# Patient Record
Sex: Female | Born: 1935
Health system: Southern US, Community
[De-identification: ages and names within clinical notes are randomized; demographics above are authoritative.]

## PROBLEM LIST (undated history)

## (undated) DIAGNOSIS — F32A Depression, unspecified: Secondary | ICD-10-CM

## (undated) DIAGNOSIS — J449 Chronic obstructive pulmonary disease, unspecified: Secondary | ICD-10-CM

## (undated) DIAGNOSIS — S72009A Fracture of unspecified part of neck of unspecified femur, initial encounter for closed fracture: Secondary | ICD-10-CM

## (undated) DIAGNOSIS — R413 Other amnesia: Secondary | ICD-10-CM

## (undated) DIAGNOSIS — M810 Age-related osteoporosis without current pathological fracture: Secondary | ICD-10-CM

## (undated) DIAGNOSIS — I499 Cardiac arrhythmia, unspecified: Secondary | ICD-10-CM

## (undated) DIAGNOSIS — J189 Pneumonia, unspecified organism: Secondary | ICD-10-CM

## (undated) DIAGNOSIS — M1990 Unspecified osteoarthritis, unspecified site: Secondary | ICD-10-CM

## (undated) DIAGNOSIS — I10 Essential (primary) hypertension: Secondary | ICD-10-CM

## (undated) DIAGNOSIS — I639 Cerebral infarction, unspecified: Secondary | ICD-10-CM

## (undated) DIAGNOSIS — M25559 Pain in unspecified hip: Secondary | ICD-10-CM

## (undated) DIAGNOSIS — R0602 Shortness of breath: Secondary | ICD-10-CM

## (undated) DIAGNOSIS — F419 Anxiety disorder, unspecified: Secondary | ICD-10-CM

## (undated) DIAGNOSIS — I5032 Chronic diastolic (congestive) heart failure: Secondary | ICD-10-CM

## (undated) DIAGNOSIS — L309 Dermatitis, unspecified: Secondary | ICD-10-CM

## (undated) DIAGNOSIS — R233 Spontaneous ecchymoses: Secondary | ICD-10-CM

## (undated) DIAGNOSIS — D649 Anemia, unspecified: Secondary | ICD-10-CM

## (undated) DIAGNOSIS — F0390 Unspecified dementia without behavioral disturbance: Secondary | ICD-10-CM

## (undated) DIAGNOSIS — R296 Repeated falls: Secondary | ICD-10-CM

## (undated) DIAGNOSIS — F329 Major depressive disorder, single episode, unspecified: Secondary | ICD-10-CM

## (undated) DIAGNOSIS — R238 Other skin changes: Secondary | ICD-10-CM

## (undated) DIAGNOSIS — W19XXXA Unspecified fall, initial encounter: Secondary | ICD-10-CM

## (undated) HISTORY — DX: Other amnesia: R41.3

## (undated) HISTORY — PX: ABDOMINAL HYSTERECTOMY: SHX81

## (undated) HISTORY — DX: Chronic diastolic (congestive) heart failure: I50.32

## (undated) HISTORY — DX: Anxiety disorder, unspecified: F41.9

## (undated) HISTORY — PX: LEG SURGERY: SHX1003

## (undated) HISTORY — PX: JOINT REPLACEMENT: SHX530

---

## 1997-10-03 ENCOUNTER — Inpatient Hospital Stay (HOSPITAL_COMMUNITY): Admission: EM | Admit: 1997-10-03 | Discharge: 1997-10-07 | Payer: Self-pay | Admitting: Emergency Medicine

## 1997-10-21 ENCOUNTER — Ambulatory Visit (HOSPITAL_COMMUNITY): Admission: RE | Admit: 1997-10-21 | Discharge: 1997-10-21 | Payer: Self-pay | Admitting: General Surgery

## 1997-11-04 ENCOUNTER — Ambulatory Visit (HOSPITAL_COMMUNITY): Admission: RE | Admit: 1997-11-04 | Discharge: 1997-11-04 | Payer: Self-pay | Admitting: General Surgery

## 1998-05-01 ENCOUNTER — Emergency Department (HOSPITAL_COMMUNITY): Admission: EM | Admit: 1998-05-01 | Discharge: 1998-05-01 | Payer: Self-pay | Admitting: Emergency Medicine

## 1999-12-27 ENCOUNTER — Encounter: Payer: Self-pay | Admitting: *Deleted

## 1999-12-27 ENCOUNTER — Encounter: Admission: RE | Admit: 1999-12-27 | Discharge: 1999-12-27 | Payer: Self-pay | Admitting: *Deleted

## 2005-08-30 ENCOUNTER — Ambulatory Visit (HOSPITAL_COMMUNITY): Payer: Self-pay | Admitting: *Deleted

## 2006-02-26 HISTORY — PX: OTHER SURGICAL HISTORY: SHX169

## 2006-09-08 ENCOUNTER — Inpatient Hospital Stay (HOSPITAL_COMMUNITY): Admission: EM | Admit: 2006-09-08 | Discharge: 2006-09-16 | Payer: Self-pay | Admitting: Emergency Medicine

## 2006-09-12 ENCOUNTER — Ambulatory Visit: Payer: Self-pay | Admitting: Physical Medicine & Rehabilitation

## 2006-12-03 ENCOUNTER — Ambulatory Visit (HOSPITAL_COMMUNITY): Admission: RE | Admit: 2006-12-03 | Discharge: 2006-12-03 | Payer: Self-pay | Admitting: Internal Medicine

## 2007-12-17 ENCOUNTER — Ambulatory Visit (HOSPITAL_COMMUNITY): Admission: RE | Admit: 2007-12-17 | Discharge: 2007-12-17 | Payer: Self-pay | Admitting: Internal Medicine

## 2008-05-25 ENCOUNTER — Encounter: Admission: RE | Admit: 2008-05-25 | Discharge: 2008-05-25 | Payer: Self-pay | Admitting: Internal Medicine

## 2008-08-03 ENCOUNTER — Encounter: Admission: RE | Admit: 2008-08-03 | Discharge: 2008-08-03 | Payer: Self-pay | Admitting: Internal Medicine

## 2008-10-06 ENCOUNTER — Emergency Department (HOSPITAL_COMMUNITY): Admission: EM | Admit: 2008-10-06 | Discharge: 2008-10-06 | Payer: Self-pay | Admitting: Emergency Medicine

## 2008-12-24 ENCOUNTER — Ambulatory Visit (HOSPITAL_COMMUNITY): Admission: RE | Admit: 2008-12-24 | Discharge: 2008-12-24 | Payer: Self-pay | Admitting: Internal Medicine

## 2009-08-04 ENCOUNTER — Encounter: Admission: RE | Admit: 2009-08-04 | Discharge: 2009-08-04 | Payer: Self-pay | Admitting: Internal Medicine

## 2010-01-05 ENCOUNTER — Ambulatory Visit (HOSPITAL_COMMUNITY)
Admission: RE | Admit: 2010-01-05 | Discharge: 2010-01-05 | Payer: Self-pay | Source: Home / Self Care | Admitting: Internal Medicine

## 2010-06-30 ENCOUNTER — Other Ambulatory Visit: Payer: Self-pay | Admitting: Internal Medicine

## 2010-06-30 DIAGNOSIS — Z1231 Encounter for screening mammogram for malignant neoplasm of breast: Secondary | ICD-10-CM

## 2010-07-11 NOTE — Op Note (Signed)
Yolanda Baker NO.:  [id number]   MEDICAL RECORD NO.:  [id number]          PATIENT TYPE:  INP   LOCATION:  5029                         FACILITY:  [facility]   PHYSICIAN:  Ollen Gross, M.D.    DATE OF BIRTH:  [birth year]   DATE OF PROCEDURE:  09/09/2006  DATE OF DISCHARGE:                               OPERATIVE REPORT   PREOPERATIVE DIAGNOSES:  Displaced left femoral neck fracture.   POSTOPERATIVE DIAGNOSIS:  Displaced left femoral neck fracture.   PROCEDURE:  Left hip hemiarthroplasty.   SURGEON:  Dr. Lequita Halt.   ASSISTANT:  [name], PA-C.   ANESTHESIA:  General.   ESTIMATED BLOOD LOSS:  200.   DRAINS:  None.   COMPLICATION:  None.   CONDITION:  Stable to recovery room.   CLINICAL NOTE:  Yolanda Baker is an 77 year old female who had fall at home  yesterday sustaining a displaced left femoral neck fracture.  She  presents for left hip hemiarthroplasty.   PROCEDURE IN DETAIL:  After successful administration of general  anesthetic, the patient was  placed in the right lateral decubitus  position with the left side up and held with the hip positioner. The  left lower extremity was isolated from her perineum with plastic drapes  and prepped and draped in the usual sterile fashion. A short  posterolateral incision was made with a 10 blade through the  subcutaneous tissue to the level of the fascia lata which was incised in  line with the skin incision.  The sciatic nerve was palpated and  protected and short external rotators isolated off the femur.  Capsulotomy was performed and the fracture identified.  The femoral head  is removed, diameter measures 47 mm.  I addressed the femoral neck and  with an oscillating saw cut it to the appropriate height and  configuration.  We used a canal finder to access the femoral canal and  thoroughly irrigated the canal.  Broaching starts at size 1 up to a size  3 for the DePuy Summit basic cemented stem.  We then  trialed the 47-mm  bipolar into the acetabulum and had a great suction fit with that.   The cement restrictor was placed, a size 3 at the appropriate depth in  the femoral canal.  The femur was prepared with pulsatile lavage.  The  cement was mixed and once ready for implantation is injected into the  femoral canal and pressurized.  The size 3 Summit basic cemented stem is  placed matching the patient's native anteversion.  Once the cement fully  hardens then the trial 28 plus 1.5 head is placed with the trial 47  bipolar.  The hip is reduced with excellent stability, full extension,  flexion and rotation to 70 degrees flexion and 40 degrees adduction, 70  degrees internal rotation and 90 degrees,  flexion, 70 degrees internal  rotation.  The hip was dislocated and the trials removed.  The permanent  28 mm +1.5 head is placed with the 47 mm bipolar component. The hip is  then reduced with the same stability parameters.  The wound is copiously  irrigated with saline solution and the capsule and short rotators  reattached to the femur through drill holes.  The fascia lata was closed  with interrupted #1 Vicryl, subcu closed with #1 and 2-0 Vicryl and  subcuticular with running 4-0 Monocryl.  The incision is cleaned and  dried and Steri-Strips and a bulky sterile dressing applied.  She is  placed into a knee immobilizer, awakened and transported to recovery in  stable condition.      Ollen Gross, M.D.  Electronically Signed     FA/MEDQ  D:  09/09/2006  T:  09/10/2006  Job:  [id number]

## 2010-07-14 NOTE — Discharge Summary (Signed)
[name], Baker NO.:  [id number]   MEDICAL RECORD NO.:  [id number]          PATIENT TYPE:  INP   LOCATION:  5029                         FACILITY:  [facility]   PHYSICIAN:  Ollen Gross, M.D.    DATE OF BIRTH:  Oct 15, 1923   DATE OF ADMISSION:  09/08/2006  DATE OF DISCHARGE:  09/16/2006                               DISCHARGE SUMMARY   ADMISSION DIAGNOSES:  1. Displaced left femoral neck fracture.  2. Hyponatremia.  3. Osteoporosis.  4. Hypertension.  5. Depression.   DISCHARGE DIAGNOSES:  1. Displaced left femoral neck fracture, status post left hip      hemiarthroplasty.  2. Mild postoperative blood loss anemia, did not require transfusion.  3. Hypokalemia postoperatively, improved.  4. Preoperative hyponatremia, improved postoperatively.  5. Displaced left femoral neck fracture.  6. Hyponatremia.  7. Osteoporosis.  8. Hypertension.  9. Depression.   PROCEDURE:  Date of surgery September 09, 2006, left hip hemiarthroplasty,  surgery Dr. Lequita Halt, assistant Alexzandrew L. Perkins, P.A.C.  Surgery  done under general anesthesia.   CONSULTATIONS:  1. Dr. Felipa Eth covering for Dr. Waynard Edwards.  2. Rehabilitation services, Dr. Thersa Salt.   BRIEF HISTORY:  The patient is an 77 year old female who was admitted to  [facility] on September 08, 2006, for displaced left femoral neck  fracture.  She was admitted and had medical consult called for  preoperative clearance.   LABORATORY DATA:  Preoperative CBC showed hemoglobin 11.8, hematocrit  34.6, white cell count 9.3, postoperative hemoglobin 10.6, then 9.2,  stabilized at H&H 9.2 and 26.6.  Blood gas taken September 08, 2006, pH 7.38,  pCO2 37.7, bicarb 22.6, total CO2 24, base deficit 2.0.  PT INR 15.8 and  1.2 on admission.  Serum _____________ were given per  protocol, last PT  INR 25.6 and 2.2.  BMET on admission sodium 123, chloride 92, glucose  elevated at 125.  Serial BMETs were followed.  Sodium came up to 127  and  then 134.  Last noted normal at 136.  Potassium started normal level at  3.6, dropped down to 3.1, went back to 3.2 then last noted normal at  3.8.  Urine osmolality low at 121, urine sodium less than 10.  Preoperative UA negative.  EKG on the chart dated April 18, 2004,  rate 66, first-degree AV block, normal sterile fashion, no ST or T  changes.  Do not see a chest x-ray report in this chart.  Portable chest  September 08, 2006, suspicious for problems at left atrial appendage raising  the question of mitral valve valvular disease, clinical correlate some  suspicious pulmonary hypertension and COPD.  Other EKG dated September 08, 2006, normal sinus rhythm, sinus arrhythmia, right superior axis  deviation, low voltage QRS.   HOSPITAL COURSE:  The patient admitted to [facility], placed at  bed rest and in Buck's traction for the fracture.  After Dr. Waynard Edwards was  consulted, Dr. Felipa Eth was on call.  The patient was seen and evaluated by  Dr. Felipa Eth.  She was noted to have a low sodium  which was felt to be  multifactorial to medications. Placed on fluid restrictions.  She was  placed on bed rest and waited until her sodium started to improve.  By  the following day her sodium was coming up and the next morning on the  14th, seen by medical services who felt that she would be okay to  surgery.  She was taken to the operating room later that evening and  underwent the procedure without complications.  Tolerated her procedure  well, later was transferred to the recovery room and back to the medical  floor for postoperative care.  She was placed on Coumadin for DVT  prophylaxis, given 24 hours of postoperative IV antibiotics.  Dr. Felipa Eth  and medical team continued to follow along.  She still remained with low  sodium albeit it did improve throughout the hospital course with just  given medications and fluid restrictions.  By day 1 and day 2 she was  doing a little bit better with each day  following surgery.  She started  getting up PT.  Her sodium had come up to the low 130s.  We discontinued  her fluids.  By day 2 she had a little bit low potassium but it was felt  to be due to some delusional component.  We put her on potassium  supplements. Her hemoglobin was low postoperatively down to 9.2, did put  her on iron supplements and her hemoglobin stabilized.  Dressing was  changed on day 2.  Incision healing well and remained in good condition  and healing throughout the hospital course.  She was slow to progress  with physical therapy therefore we got a rehabilitation consult.  The  patient was seen in consultation by rehabilitation services and felt  that she was doing pretty well and had good support, that she may not  require inpatient rehabilitation but continue to follow along.  The  patient was starting to progress with her physical therapy.  By day 5  she was up  ambulating 50 feet.  She wanted to go home, therefore  discharge planning started to make arrangements but she was not quite  ready yet.  She continued to receive therapy and medical care and  progressed through the weekend and by September 16, 2006, she was stable from  a medical standpoint, her sodium had improved.  Her electrolyte  imbalance stabilized.  She was starting progressive physical therapy and  arrangements were made for home care.  She was discharged home.   DISCHARGE PLAN:  The patient was discharged home on September 16, 2006.   DISCHARGE DIAGNOSES:  1. Displaced left femoral neck fracture, status post left hip      hemiarthroplasty.  2. Mild postoperative blood loss anemia, did not require transfusion.  3. Hypokalemia postoperatively, improved.  4. Preoperative hyponatremia, improved postoperatively.  5. Displaced left femoral neck fracture.  6. Hyponatremia.  7. Osteoporosis.  8. Hypertension.  9. Depression.   PROCEDURE:  Date of surgery September 09, 2006, left hip hemiarthroplasty,  surgery  Dr. Lequita Halt, assistant Alexzandrew L. Perkins, P.A.C.  Surgery  done under general anesthesia.   DISCHARGE MEDICATIONS:  1. Coumadin.  2. Vicodin.  3. Robaxin.   ACTIVITY:  She is weightbear as tolerated to left lower extremity.  Hip  precautions, daily dressing changes.  May start showering, home health  PT, home health nursing.   FOLLOWUP:  1 week following discharge.   DISPOSITION:  Home.   CONDITION ON DISCHARGE:  Slowly  improving.      Alexzandrew L. Perkins, P.A.C.      Ollen Gross, M.D.  Electronically Signed    ALP/MEDQ  D:  12/12/2006  T:  12/12/2006  Job:  [id number]   cc:   Ollen Gross, M.D.  Redge Gainer. Perini, M.D.

## 2010-08-07 ENCOUNTER — Ambulatory Visit: Payer: Self-pay

## 2010-08-08 ENCOUNTER — Ambulatory Visit
Admission: RE | Admit: 2010-08-08 | Discharge: 2010-08-08 | Disposition: A | Discharge status: Home / Self Care | Payer: Medicare Other | Source: Ambulatory Visit | Attending: Internal Medicine | Admitting: Internal Medicine

## 2010-08-08 DIAGNOSIS — Z1231 Encounter for screening mammogram for malignant neoplasm of breast: Secondary | ICD-10-CM

## 2010-09-19 ENCOUNTER — Other Ambulatory Visit: Payer: Self-pay | Admitting: Internal Medicine

## 2010-09-19 DIAGNOSIS — R413 Other amnesia: Secondary | ICD-10-CM

## 2010-09-28 ENCOUNTER — Ambulatory Visit
Admission: RE | Admit: 2010-09-28 | Discharge: 2010-09-28 | Disposition: A | Discharge status: Home / Self Care | Payer: Medicare Other | Source: Ambulatory Visit | Attending: Internal Medicine | Admitting: Internal Medicine

## 2010-09-28 DIAGNOSIS — R413 Other amnesia: Secondary | ICD-10-CM

## 2010-12-11 LAB — CBC
HCT: 26.6 — ABNORMAL LOW
HCT: 26.6 — ABNORMAL LOW
HCT: 31.3 — ABNORMAL LOW
Hemoglobin: 10.6 — ABNORMAL LOW
Hemoglobin: 9.2 — ABNORMAL LOW
Hemoglobin: 9.2 — ABNORMAL LOW
MCHC: 34
MCHC: 34.5
MCHC: 34.5
MCV: 94
MCV: 94.3
MCV: 95.7
Platelets: 147 — ABNORMAL LOW
Platelets: 175
Platelets: 177
RBC: 2.82 — ABNORMAL LOW
RBC: 2.83 — ABNORMAL LOW
RBC: 3.27 — ABNORMAL LOW
RDW: 13
RDW: 13.2
RDW: 13.2
WBC: 7
WBC: 8
WBC: 8.9

## 2010-12-11 LAB — BASIC METABOLIC PANEL
BUN: 12
BUN: 12
BUN: 6
BUN: 7
BUN: 9
BUN: 9
CO2: 22
CO2: 25
CO2: 26
CO2: 27
CO2: 27
CO2: 28
Calcium: 7.2 — ABNORMAL LOW
Calcium: 7.3 — ABNORMAL LOW
Calcium: 7.7 — ABNORMAL LOW
Calcium: 7.9 — ABNORMAL LOW
Calcium: 8.3 — ABNORMAL LOW
Calcium: 8.5
Chloride: 100
Chloride: 102
Chloride: 103
Chloride: 104
Chloride: 104
Chloride: 99
Creatinine, Ser: 0.63
Creatinine, Ser: 0.68
Creatinine, Ser: 0.7
Creatinine, Ser: 0.71
Creatinine, Ser: 0.74
Creatinine, Ser: 0.75
GFR calc Af Amer: >60
GFR calc Af Amer: >60
GFR calc Af Amer: >60
GFR calc Af Amer: >60
GFR calc Af Amer: >60
GFR calc Af Amer: >60
GFR calc non Af Amer: >60
GFR calc non Af Amer: >60
GFR calc non Af Amer: >60
GFR calc non Af Amer: >60
GFR calc non Af Amer: >60
GFR calc non Af Amer: >60
Glucose, Bld: 100 — ABNORMAL HIGH
Glucose, Bld: 100 — ABNORMAL HIGH
Glucose, Bld: 108 — ABNORMAL HIGH
Glucose, Bld: 117 — ABNORMAL HIGH
Glucose, Bld: 95
Glucose, Bld: 96
Potassium: 3.2 — ABNORMAL LOW
Potassium: 3.5
Potassium: 3.6
Potassium: 3.8
Potassium: 3.8
Potassium: 4.1
Sodium: 131 — ABNORMAL LOW
Sodium: 131 — ABNORMAL LOW
Sodium: 132 — ABNORMAL LOW
Sodium: 134 — ABNORMAL LOW
Sodium: 136
Sodium: 138

## 2010-12-11 LAB — PROTIME-INR
INR: 1.2
INR: 1.7 — ABNORMAL HIGH
INR: 2 — ABNORMAL HIGH
INR: 2 — ABNORMAL HIGH
INR: 2.2 — ABNORMAL HIGH
Prothrombin Time: 15.8 — ABNORMAL HIGH
Prothrombin Time: 20.9 — ABNORMAL HIGH
Prothrombin Time: 23.3 — ABNORMAL HIGH
Prothrombin Time: 23.3 — ABNORMAL HIGH
Prothrombin Time: 25.6 — ABNORMAL HIGH

## 2010-12-12 LAB — URINALYSIS, ROUTINE W REFLEX MICROSCOPIC
Bilirubin Urine: NEGATIVE
Glucose, UA: NEGATIVE
Hgb urine dipstick: NEGATIVE
Ketones, ur: NEGATIVE
Nitrite: NEGATIVE
Protein, ur: NEGATIVE
Specific Gravity, Urine: 1.009
Urobilinogen, UA: 0.2
pH: 7.5

## 2010-12-12 LAB — DIFFERENTIAL
Basophils Absolute: 0
Basophils Relative: 0
Eosinophils Absolute: 0
Eosinophils Relative: 0
Lymphocytes Relative: 11 — ABNORMAL LOW
Lymphs Abs: 1
Monocytes Absolute: 0.4
Monocytes Relative: 4
Neutro Abs: 7.9 — ABNORMAL HIGH
Neutrophils Relative %: 84 — ABNORMAL HIGH

## 2010-12-12 LAB — I-STAT 8, (EC8 V) (CONVERTED LAB)
Acid-base deficit: 1
Acid-base deficit: 2
BUN: 12
BUN: 12
Bicarbonate: 22.7
Bicarbonate: 23.5
Chloride: 92 — ABNORMAL LOW
Chloride: 92 — ABNORMAL LOW
Glucose, Bld: 124 — ABNORMAL HIGH
Glucose, Bld: 125 — ABNORMAL HIGH
HCT: 38
HCT: 38
Hemoglobin: 12.9
Hemoglobin: 12.9
Operator id: 270111
Operator id: 270111
Potassium: 3.5
Potassium: 3.6
Sodium: 123 — ABNORMAL LOW
Sodium: 123 — ABNORMAL LOW
TCO2: 24
TCO2: 25
pCO2, Ven: 37.2 — ABNORMAL LOW
pCO2, Ven: 37.7 — ABNORMAL LOW
pH, Ven: 7.388 — ABNORMAL HIGH
pH, Ven: 7.408 — ABNORMAL HIGH

## 2010-12-12 LAB — CBC
HCT: 34.6 — ABNORMAL LOW
Hemoglobin: 11.8 — ABNORMAL LOW
MCHC: 34
MCV: 95
Platelets: 216
RBC: 3.65 — ABNORMAL LOW
RDW: 13
WBC: 9.3

## 2010-12-12 LAB — BASIC METABOLIC PANEL
BUN: 10
CO2: 26
Calcium: 8.3 — ABNORMAL LOW
Chloride: 92 — ABNORMAL LOW
Creatinine, Ser: 0.74
GFR calc Af Amer: >60
GFR calc non Af Amer: >60
Glucose, Bld: 101 — ABNORMAL HIGH
Potassium: 3.1 — ABNORMAL LOW
Sodium: 127 — ABNORMAL LOW

## 2010-12-12 LAB — PROTIME-INR
INR: 1.2
INR: 1.2
Prothrombin Time: 15.4 — ABNORMAL HIGH
Prothrombin Time: 15.8 — ABNORMAL HIGH

## 2010-12-12 LAB — POCT I-STAT CREATININE
Creatinine, Ser: 0.8
Operator id: 270111

## 2010-12-12 LAB — SODIUM, URINE, RANDOM: Sodium, Ur: <10

## 2010-12-12 LAB — OSMOLALITY, URINE: Osmolality, Ur: 121 — ABNORMAL LOW

## 2010-12-12 LAB — OSMOLALITY: Osmolality: 263 — ABNORMAL LOW

## 2011-07-03 ENCOUNTER — Other Ambulatory Visit: Payer: Self-pay | Admitting: Internal Medicine

## 2011-07-03 DIAGNOSIS — Z1231 Encounter for screening mammogram for malignant neoplasm of breast: Secondary | ICD-10-CM

## 2011-08-14 ENCOUNTER — Ambulatory Visit
Admission: RE | Admit: 2011-08-14 | Discharge: 2011-08-14 | Disposition: A | Discharge status: Home / Self Care | Payer: Medicare Other | Source: Ambulatory Visit | Attending: Internal Medicine | Admitting: Internal Medicine

## 2011-08-14 DIAGNOSIS — Z1231 Encounter for screening mammogram for malignant neoplasm of breast: Secondary | ICD-10-CM

## 2011-11-22 ENCOUNTER — Ambulatory Visit (HOSPITAL_COMMUNITY)
Admission: RE | Admit: 2011-11-22 | Discharge: 2011-11-22 | Disposition: A | Discharge status: Home / Self Care | Payer: Medicare Other | Source: Ambulatory Visit | Attending: Internal Medicine | Admitting: Internal Medicine

## 2011-11-22 ENCOUNTER — Encounter (HOSPITAL_COMMUNITY): Payer: Self-pay

## 2011-11-22 DIAGNOSIS — M810 Age-related osteoporosis without current pathological fracture: Secondary | ICD-10-CM | POA: Insufficient documentation

## 2011-11-22 HISTORY — DX: Unspecified osteoarthritis, unspecified site: M19.90

## 2011-11-22 HISTORY — DX: Depression, unspecified: F32.A

## 2011-11-22 HISTORY — DX: Major depressive disorder, single episode, unspecified: F32.9

## 2011-11-22 HISTORY — DX: Essential (primary) hypertension: I10

## 2011-11-22 HISTORY — DX: Age-related osteoporosis without current pathological fracture: M81.0

## 2011-11-22 MED ORDER — ZOLEDRONIC ACID 5 MG/100ML IV SOLN
5.0000 mg | Freq: Once | INTRAVENOUS | Status: AC
  Administered 2011-11-22: 5 mg via INTRAVENOUS
  Filled 2011-11-22: qty 100

## 2011-11-22 MED ORDER — SODIUM CHLORIDE 0.9 % IV SOLN
Freq: Once | INTRAVENOUS | Status: AC
  Administered 2011-11-22: 250 mL via INTRAVENOUS

## 2012-07-18 ENCOUNTER — Other Ambulatory Visit: Payer: Self-pay

## 2012-07-18 DIAGNOSIS — Z1231 Encounter for screening mammogram for malignant neoplasm of breast: Secondary | ICD-10-CM

## 2012-08-26 ENCOUNTER — Ambulatory Visit
Admission: RE | Admit: 2012-08-26 | Discharge: 2012-08-26 | Disposition: A | Discharge status: Home / Self Care | Payer: Medicare Other | Source: Ambulatory Visit

## 2012-08-26 DIAGNOSIS — Z1231 Encounter for screening mammogram for malignant neoplasm of breast: Secondary | ICD-10-CM

## 2012-08-28 ENCOUNTER — Other Ambulatory Visit: Payer: Self-pay | Admitting: Internal Medicine

## 2012-08-28 DIAGNOSIS — R928 Other abnormal and inconclusive findings on diagnostic imaging of breast: Secondary | ICD-10-CM

## 2012-09-11 ENCOUNTER — Ambulatory Visit
Admission: RE | Admit: 2012-09-11 | Discharge: 2012-09-11 | Disposition: A | Discharge status: Home / Self Care | Payer: Medicare Other | Source: Ambulatory Visit | Attending: Internal Medicine | Admitting: Internal Medicine

## 2012-09-11 DIAGNOSIS — R928 Other abnormal and inconclusive findings on diagnostic imaging of breast: Secondary | ICD-10-CM

## 2012-09-16 ENCOUNTER — Other Ambulatory Visit: Payer: Medicare Other

## 2013-02-04 ENCOUNTER — Ambulatory Visit (HOSPITAL_COMMUNITY)
Admission: RE | Admit: 2013-02-04 | Discharge: 2013-02-04 | Disposition: A | Discharge status: Home / Self Care | Payer: Medicare Other | Source: Ambulatory Visit | Attending: Internal Medicine | Admitting: Internal Medicine

## 2013-02-04 ENCOUNTER — Other Ambulatory Visit (HOSPITAL_COMMUNITY): Payer: Self-pay | Admitting: Internal Medicine

## 2013-02-04 ENCOUNTER — Encounter (HOSPITAL_COMMUNITY): Payer: Self-pay

## 2013-02-04 DIAGNOSIS — M810 Age-related osteoporosis without current pathological fracture: Secondary | ICD-10-CM | POA: Insufficient documentation

## 2013-02-04 MED ORDER — ZOLEDRONIC ACID 5 MG/100ML IV SOLN
5.0000 mg | Freq: Once | INTRAVENOUS | Status: AC
  Administered 2013-02-04: 5 mg via INTRAVENOUS
  Filled 2013-02-04: qty 100

## 2013-02-04 MED ORDER — SODIUM CHLORIDE 0.9 % IV SOLN
Freq: Once | INTRAVENOUS | Status: AC
  Administered 2013-02-04: 09:00:00 via INTRAVENOUS

## 2013-02-24 ENCOUNTER — Ambulatory Visit: Payer: Medicare Other

## 2013-02-24 ENCOUNTER — Ambulatory Visit (INDEPENDENT_AMBULATORY_CARE_PROVIDER_SITE_OTHER): Payer: Medicare Other | Admitting: Family Medicine

## 2013-02-24 VITALS — BP 116/68 | HR 108 | Temp 98.3°F | Resp 17 | Ht 62.0 in | Wt 103.0 lb

## 2013-02-24 DIAGNOSIS — M25552 Pain in left hip: Secondary | ICD-10-CM

## 2013-02-24 DIAGNOSIS — IMO0002 Reserved for concepts with insufficient information to code with codable children: Secondary | ICD-10-CM

## 2013-02-24 DIAGNOSIS — S8012XA Contusion of left lower leg, initial encounter: Secondary | ICD-10-CM

## 2013-02-24 DIAGNOSIS — S7002XS Contusion of left hip, sequela: Secondary | ICD-10-CM

## 2013-02-24 DIAGNOSIS — S8010XA Contusion of unspecified lower leg, initial encounter: Secondary | ICD-10-CM

## 2013-02-24 DIAGNOSIS — M25559 Pain in unspecified hip: Secondary | ICD-10-CM

## 2013-02-24 NOTE — Progress Notes (Signed)
Subjective: Couple of weeks ago the patient fell landing on her left hip. She tripped over a parking block. The ambulance came but she was able to get up and ambulate. It is been sore but gradually improved over the last couple of weeks. Today she was swollen more in her left ankle, same side she had fallen on, and noticed it was black and blue. She had had a fracture of her left hip several years ago, about 4 years ago, so has had a left hip pinning.  Objective: Pleasant elderly lady in no major acute distress. The pelvis is not particularly tender except for a little tenderness run across pubic symphysis. Her hip has good range of motion. There is a little bruise on her left buttock. She has some bruising on the left medial thigh. Her shin with a bruised appearance down in the ankle and foot also. She is to 3+ edema of the left ankle. Both feet are cool. Toes are nice and paint with no bruising. Satisfactory capillary refill. Negative Homans.  Assessment: Left hip contusion Rule out pelvic fracture Resolving hematoma from hip dissected to the lower leg and foot causing ecchymosis Remote history of hip fracture  Plan: X-ray left hip [facility] reading (PRIMARY) by  Dr. Alwyn Ren Normal hip prosthesis, no new bony injury of hip or pelvis noted  Treat symptomatically. I think the ecchymosis will gradually resorb.Marland Kitchen

## 2013-02-24 NOTE — Patient Instructions (Signed)
Take Tylenol if needed for pain.  Ambulate as able. Use your cane. When your sitting around keep the foot elevated. The swelling and bruising should gradually decrease over the next couple of weeks.   If getting worse at anytime get rechecked.

## 2013-05-01 ENCOUNTER — Ambulatory Visit: Payer: Medicare Other

## 2013-05-01 ENCOUNTER — Ambulatory Visit (INDEPENDENT_AMBULATORY_CARE_PROVIDER_SITE_OTHER): Payer: Medicare Other | Admitting: Family Medicine

## 2013-05-01 VITALS — BP 152/70 | HR 82 | Temp 97.5°F | Resp 18

## 2013-05-01 DIAGNOSIS — S7000XA Contusion of unspecified hip, initial encounter: Secondary | ICD-10-CM

## 2013-05-01 DIAGNOSIS — S7002XA Contusion of left hip, initial encounter: Secondary | ICD-10-CM

## 2013-05-01 DIAGNOSIS — M25552 Pain in left hip: Secondary | ICD-10-CM

## 2013-05-01 DIAGNOSIS — M25559 Pain in unspecified hip: Secondary | ICD-10-CM

## 2013-05-01 MED ORDER — METHYLPREDNISOLONE ACETATE 80 MG/ML IJ SUSP
80.0000 mg | Freq: Once | INTRAMUSCULAR | Status: AC
  Administered 2013-05-01: 80 mg via INTRAMUSCULAR

## 2013-05-01 NOTE — Patient Instructions (Signed)
Use a cane when leaving the home  Take Tylenol extended release 650 one pill 3 times daily in the morning, afternoon, and bedtime. Try that for about 2 weeks and see if it helps your pain. You can continue using it 2-3 times daily if it is giving you some relief.  If it gets worse or is not doing better either return to see your orthopedist or come back for a recheck here. There is no magic wand for curing this problem. However we want you to be able to stay as mobile as possible

## 2013-05-01 NOTE — Progress Notes (Signed)
Subjective: 80 year old lady who was coming in here for left ip pain. She drove herself here and coming into the office she failed on her landing on her left hip again. She is hurting. However she was able to bear some weight after she got up.  Objective: Good range of motion of her left knee and flexion to of her leg back and forth while seated in a wheelchair. She is tender in the left hip. I did not get her up on the exam table initially. I am getting x-rays of her  Assessment: Left hip contusion and pain, abdominal pain, in patient with left hip total arthroplasty, chronic pain, and acute fall today.  Plan: X-ray hip  [facility] reading (PRIMARY) by  Dr. Alwyn Ren No acute change.  See discharge instructions for Tylenol.  Will give an injection of Depo-Medrol 80 today to see if that will give her some initial calming down of the pain and inflammation.

## 2013-06-04 ENCOUNTER — Inpatient Hospital Stay (HOSPITAL_COMMUNITY)
Admission: EM | Admit: 2013-06-04 | Discharge: 2013-06-08 | DRG: 481 | Disposition: A | Discharge status: Skilled Nursing Facility | Payer: Medicare Other | Attending: Internal Medicine | Admitting: Internal Medicine

## 2013-06-04 ENCOUNTER — Emergency Department (HOSPITAL_COMMUNITY): Payer: Medicare Other

## 2013-06-04 ENCOUNTER — Encounter (HOSPITAL_COMMUNITY): Payer: Self-pay | Admitting: Emergency Medicine

## 2013-06-04 DIAGNOSIS — W010XXA Fall on same level from slipping, tripping and stumbling without subsequent striking against object, initial encounter: Secondary | ICD-10-CM | POA: Diagnosis present

## 2013-06-04 DIAGNOSIS — S72001A Fracture of unspecified part of neck of right femur, initial encounter for closed fracture: Secondary | ICD-10-CM | POA: Diagnosis present

## 2013-06-04 DIAGNOSIS — S72143A Displaced intertrochanteric fracture of unspecified femur, initial encounter for closed fracture: Principal | ICD-10-CM | POA: Diagnosis present

## 2013-06-04 DIAGNOSIS — Y92009 Unspecified place in unspecified non-institutional (private) residence as the place of occurrence of the external cause: Secondary | ICD-10-CM

## 2013-06-04 DIAGNOSIS — S7223XA Displaced subtrochanteric fracture of unspecified femur, initial encounter for closed fracture: Secondary | ICD-10-CM | POA: Diagnosis present

## 2013-06-04 DIAGNOSIS — Z7982 Long term (current) use of aspirin: Secondary | ICD-10-CM

## 2013-06-04 DIAGNOSIS — F411 Generalized anxiety disorder: Secondary | ICD-10-CM

## 2013-06-04 DIAGNOSIS — F3289 Other specified depressive episodes: Secondary | ICD-10-CM

## 2013-06-04 DIAGNOSIS — S72009A Fracture of unspecified part of neck of unspecified femur, initial encounter for closed fracture: Secondary | ICD-10-CM

## 2013-06-04 DIAGNOSIS — S51009A Unspecified open wound of unspecified elbow, initial encounter: Secondary | ICD-10-CM | POA: Diagnosis present

## 2013-06-04 DIAGNOSIS — Z881 Allergy status to other antibiotic agents status: Secondary | ICD-10-CM

## 2013-06-04 DIAGNOSIS — M810 Age-related osteoporosis without current pathological fracture: Secondary | ICD-10-CM | POA: Diagnosis present

## 2013-06-04 DIAGNOSIS — F32A Depression, unspecified: Secondary | ICD-10-CM

## 2013-06-04 DIAGNOSIS — I10 Essential (primary) hypertension: Secondary | ICD-10-CM

## 2013-06-04 DIAGNOSIS — Z79899 Other long term (current) drug therapy: Secondary | ICD-10-CM

## 2013-06-04 DIAGNOSIS — F0390 Unspecified dementia without behavioral disturbance: Secondary | ICD-10-CM | POA: Diagnosis present

## 2013-06-04 DIAGNOSIS — Z9181 History of falling: Secondary | ICD-10-CM

## 2013-06-04 DIAGNOSIS — M129 Arthropathy, unspecified: Secondary | ICD-10-CM | POA: Diagnosis present

## 2013-06-04 DIAGNOSIS — S72141A Displaced intertrochanteric fracture of right femur, initial encounter for closed fracture: Secondary | ICD-10-CM

## 2013-06-04 DIAGNOSIS — I4892 Unspecified atrial flutter: Secondary | ICD-10-CM | POA: Diagnosis present

## 2013-06-04 DIAGNOSIS — D62 Acute posthemorrhagic anemia: Secondary | ICD-10-CM | POA: Diagnosis not present

## 2013-06-04 DIAGNOSIS — F329 Major depressive disorder, single episode, unspecified: Secondary | ICD-10-CM | POA: Diagnosis present

## 2013-06-04 LAB — BASIC METABOLIC PANEL
BUN: 17 mg/dL (ref 6–23)
CO2: 23 mEq/L (ref 19–32)
Calcium: 8.5 mg/dL (ref 8.4–10.5)
Chloride: 90 mEq/L — ABNORMAL LOW (ref 96–112)
Creatinine, Ser: 0.71 mg/dL (ref 0.50–1.10)
GFR calc Af Amer: 87 mL/min — ABNORMAL LOW (ref >90)
GFR calc non Af Amer: 75 mL/min — ABNORMAL LOW (ref >90)
Glucose, Bld: 133 mg/dL — ABNORMAL HIGH (ref 70–99)
Potassium: 4.4 mEq/L (ref 3.7–5.3)
Sodium: 127 mEq/L — ABNORMAL LOW (ref 137–147)

## 2013-06-04 LAB — CBC WITH DIFFERENTIAL/PLATELET
Basophils Absolute: 0 10*3/uL (ref 0.0–0.1)
Basophils Relative: 0 % (ref 0–1)
Eosinophils Absolute: 0 10*3/uL (ref 0.0–0.7)
Eosinophils Relative: 1 % (ref 0–5)
HCT: 33.8 % — ABNORMAL LOW (ref 36.0–46.0)
Hemoglobin: 11.6 g/dL — ABNORMAL LOW (ref 12.0–15.0)
Lymphocytes Relative: 17 % (ref 12–46)
Lymphs Abs: 1.1 10*3/uL (ref 0.7–4.0)
MCH: 31.9 pg (ref 26.0–34.0)
MCHC: 34.3 g/dL (ref 30.0–36.0)
MCV: 92.9 fL (ref 78.0–100.0)
Monocytes Absolute: 0.6 10*3/uL (ref 0.1–1.0)
Monocytes Relative: 9 % (ref 3–12)
Neutro Abs: 4.8 10*3/uL (ref 1.7–7.7)
Neutrophils Relative %: 73 % (ref 43–77)
Platelets: 267 10*3/uL (ref 150–400)
RBC: 3.64 MIL/uL — ABNORMAL LOW (ref 3.87–5.11)
RDW: 14.8 % (ref 11.5–15.5)
WBC: 6.5 10*3/uL (ref 4.0–10.5)

## 2013-06-04 LAB — URINALYSIS, ROUTINE W REFLEX MICROSCOPIC
Bilirubin Urine: NEGATIVE
Glucose, UA: NEGATIVE mg/dL
Ketones, ur: NEGATIVE mg/dL
Nitrite: NEGATIVE
Protein, ur: NEGATIVE mg/dL
Specific Gravity, Urine: 1.01 (ref 1.005–1.030)
Urobilinogen, UA: 0.2 mg/dL (ref 0.0–1.0)
pH: 7 (ref 5.0–8.0)

## 2013-06-04 LAB — PROTIME-INR
INR: 1.08 (ref 0.00–1.49)
Prothrombin Time: 13.8 seconds (ref 11.6–15.2)

## 2013-06-04 LAB — URINE MICROSCOPIC-ADD ON

## 2013-06-04 LAB — TYPE AND SCREEN
ABO/RH(D): O POS
Antibody Screen: NEGATIVE

## 2013-06-04 LAB — ABO/RH: ABO/RH(D): O POS

## 2013-06-04 MED ORDER — TRAMADOL HCL 50 MG PO TABS: 50.0000 mg | ORAL_TABLET | Freq: Four times a day (QID) | ORAL | Status: DC

## 2013-06-04 MED ORDER — OXYCODONE HCL 5 MG PO TABS
5.0000 mg | ORAL_TABLET | ORAL | Status: DC | PRN
  Administered 2013-06-04 – 2013-06-05 (×3): 5 mg via ORAL
  Filled 2013-06-04 (×3): qty 1

## 2013-06-04 MED ORDER — HYDROMORPHONE HCL PF 1 MG/ML IJ SOLN: 0.5000 mg | INTRAMUSCULAR | Status: DC | PRN

## 2013-06-04 MED ORDER — LISINOPRIL 10 MG PO TABS
10.0000 mg | ORAL_TABLET | Freq: Every day | ORAL | Status: DC
  Administered 2013-06-05: 10 mg via ORAL
  Filled 2013-06-04: qty 1

## 2013-06-04 MED ORDER — PSYLLIUM 95 % PO PACK
1.0000 | PACK | Freq: Four times a day (QID) | ORAL | Status: DC
  Administered 2013-06-04 – 2013-06-08 (×12): 1 via ORAL
  Filled 2013-06-04 (×18): qty 1

## 2013-06-04 MED ORDER — ONDANSETRON HCL 4 MG/2ML IJ SOLN: 4.0000 mg | Freq: Four times a day (QID) | INTRAMUSCULAR | Status: DC | PRN

## 2013-06-04 MED ORDER — HYDROCODONE-ACETAMINOPHEN 5-325 MG PO TABS: 1.0000 | ORAL_TABLET | ORAL | Status: DC | PRN

## 2013-06-04 MED ORDER — SODIUM CHLORIDE 0.9 % IV SOLN: INTRAVENOUS | Status: DC

## 2013-06-04 MED ORDER — MORPHINE SULFATE 2 MG/ML IJ SOLN
0.5000 mg | INTRAMUSCULAR | Status: DC | PRN
  Administered 2013-06-04 – 2013-06-05 (×3): 0.5 mg via INTRAVENOUS
  Filled 2013-06-04 (×3): qty 1

## 2013-06-04 MED ORDER — CITALOPRAM HYDROBROMIDE 40 MG PO TABS
40.0000 mg | ORAL_TABLET | Freq: Every day | ORAL | Status: DC
  Administered 2013-06-05: 40 mg via ORAL
  Filled 2013-06-04: qty 1

## 2013-06-04 MED ORDER — LORAZEPAM 0.5 MG PO TABS
0.5000 mg | ORAL_TABLET | Freq: Every day | ORAL | Status: DC
  Administered 2013-06-04 – 2013-06-05 (×2): 0.5 mg via ORAL
  Filled 2013-06-04 (×2): qty 1

## 2013-06-04 MED ORDER — ZIPRASIDONE HCL 80 MG PO CAPS
80.0000 mg | ORAL_CAPSULE | Freq: Every day | ORAL | Status: DC
  Administered 2013-06-04 – 2013-06-08 (×5): 80 mg via ORAL
  Filled 2013-06-04 (×5): qty 1

## 2013-06-04 MED ORDER — ONE-DAILY MULTI VITAMINS PO TABS: 1.0000 | ORAL_TABLET | Freq: Every day | ORAL | Status: DC

## 2013-06-04 MED ORDER — HYDROMORPHONE HCL PF 1 MG/ML IJ SOLN
0.5000 mg | INTRAMUSCULAR | Status: DC | PRN
  Administered 2013-06-04: 0.5 mg via INTRAVENOUS
  Filled 2013-06-04: qty 1

## 2013-06-04 MED ORDER — GALANTAMINE HYDROBROMIDE ER 8 MG PO CP24: 8.0000 mg | ORAL_CAPSULE | Freq: Every day | ORAL | Status: DC

## 2013-06-04 MED ORDER — HEPARIN SODIUM (PORCINE) 5000 UNIT/ML IJ SOLN
5000.0000 [IU] | Freq: Three times a day (TID) | INTRAMUSCULAR | Status: DC
  Administered 2013-06-04 – 2013-06-05 (×2): 5000 [IU] via SUBCUTANEOUS
  Filled 2013-06-04 (×5): qty 1

## 2013-06-04 MED ORDER — GALANTAMINE HYDROBROMIDE ER 8 MG PO CP24
8.0000 mg | ORAL_CAPSULE | Freq: Every day | ORAL | Status: DC
  Filled 2013-06-04 (×2): qty 1

## 2013-06-04 MED ORDER — GALANTAMINE HYDROBROMIDE ER 24 MG PO CP24: 8.0000 mg | ORAL_CAPSULE | Freq: Every day | ORAL | Status: DC

## 2013-06-04 MED ORDER — ONDANSETRON HCL 4 MG/2ML IJ SOLN
4.0000 mg | Freq: Once | INTRAMUSCULAR | Status: AC
Start: 2013-06-04 — End: 2013-06-04
  Administered 2013-06-04: 4 mg via INTRAVENOUS
  Filled 2013-06-04: qty 2

## 2013-06-04 MED ORDER — AMLODIPINE BESYLATE 2.5 MG PO TABS
2.5000 mg | ORAL_TABLET | Freq: Every day | ORAL | Status: DC
  Administered 2013-06-05 – 2013-06-07 (×3): 2.5 mg via ORAL
  Filled 2013-06-04 (×3): qty 1

## 2013-06-04 MED ORDER — ADULT MULTIVITAMIN W/MINERALS CH
1.0000 | ORAL_TABLET | Freq: Every day | ORAL | Status: DC
  Administered 2013-06-04 – 2013-06-08 (×5): 1 via ORAL
  Filled 2013-06-04 (×5): qty 1

## 2013-06-04 NOTE — ED Provider Notes (Signed)
Medical screening examination/treatment/procedure(s) were performed by non-physician practitioner and as supervising physician I was immediately available for consultation/collaboration.   EKG Interpretation None        Shanna Cisco, MD 06/04/13 2116

## 2013-06-04 NOTE — ED Notes (Signed)
Bed: WA08 Expected date:  Expected time:  Means of arrival:  Comments: ems- elderly

## 2013-06-04 NOTE — H&P (Signed)
[facility]          History and Physical    PCP:   Jerlyn Ly, MD   Chief Complaint:  Mechanical fall, right hip pain  HPI: Patient is an 80 year old woman with past medical history significant for hypertension, osteoporosis, depression status post left hip hemiarthroplasty in 2008 by Dr. Maureen Ralphs, who fell today while at her facility. This is a mechanical fall, she states she never lost consciousness, she tripped over her cane. Daughter was present and witnessed the fall. She also suffered a right elbow skin tear during her fall. She was brought to the hospital with an obvious deformity to her right hip. Orthopedics, Dr. Gladstone Lighter, has seen the patient and is planning on operative repair tomorrow. Hospitalist admission is requested.  Allergies:   Allergies  Allergen Reactions  . Flagyl [Metronidazole]     Fingers get "numb"      Past Medical History  Diagnosis Date  . Osteoporosis   . Hypertension   . Arthritis     neck  . Depression     Past Surgical History  Procedure Laterality Date  . Joint replacement    . Left hip    . Abdominal hysterectomy      Prior to Admission medications   Medication Sig Start Date End Date Taking? Authorizing Provider  acetaminophen (TYLENOL) 500 MG chewable tablet Chew 1,000 mg by mouth 2 (two) times daily.    Yes Historical Provider, MD  amLODipine (NORVASC) 5 MG tablet Take 2.5 mg by mouth daily.    Yes Historical Provider, MD  aspirin EC 81 MG tablet Take 81 mg by mouth daily.   Yes Historical Provider, MD  bisacodyl (DULCOLAX) 5 MG EC tablet Take 5 mg by mouth daily as needed for moderate constipation.   Yes Historical Provider, MD  calcium carbonate (OS-CAL) 600 MG TABS Take 600 mg by mouth daily.   Yes Historical Provider, MD  citalopram (CELEXA) 40 MG tablet Take 40 mg by mouth daily.   Yes Historical Provider, MD  galantamine (RAZADYNE ER) 8 MG 24 hr capsule Take 8 mg by mouth daily with breakfast.   Yes  Historical Provider, MD  lisinopril (PRINIVIL,ZESTRIL) 20 MG tablet Take 10 mg by mouth daily.    Yes Historical Provider, MD  LORAZEPAM PO Take 0.5 mg by mouth at bedtime as needed. Takes an extra dose during the day if needed   Yes Historical Provider, MD  Multiple Vitamin (MULTIVITAMIN) tablet Take 1 tablet by mouth daily.   Yes Historical Provider, MD  potassium chloride SA (K-DUR,KLOR-CON) 20 MEQ tablet Take 20 mEq by mouth every other day.    Yes Historical Provider, MD  psyllium (METAMUCIL) 58.6 % powder Take 1 packet by mouth 4 (four) times daily. Pt takes 1 tablespoon at each dose   Yes Historical Provider, MD  vitamin E 400 UNIT capsule Take 400 Units by mouth daily.   Yes Historical Provider, MD  ziprasidone (GEODON) 80 MG capsule Take 80 mg by mouth daily.   Yes Historical Provider, MD  zoledronic acid (RECLAST) 5 MG/100ML SOLN Inject 5 mg into the vein once. Yearly by IV, for osteoporosis    Historical Provider, MD    Social History:  reports that she has never smoked. She has never used smokeless tobacco. She reports that she does not drink alcohol or use illicit drugs.  History reviewed. No pertinent family history.  Review  of Systems:  Constitutional: Denies fever, chills, diaphoresis, appetite change and fatigue.  HEENT: Denies photophobia, eye pain, redness, hearing loss, ear pain, congestion, sore throat, rhinorrhea, sneezing, mouth sores, trouble swallowing, neck pain, neck stiffness and tinnitus.   Respiratory: Denies SOB, DOE, cough, chest tightness,  and wheezing.   Cardiovascular: Denies chest pain, palpitations and leg swelling.  Gastrointestinal: Denies nausea, vomiting, abdominal pain, diarrhea, constipation, blood in stool and abdominal distention.  Genitourinary: Denies dysuria, urgency, frequency, hematuria, flank pain and difficulty urinating.  Endocrine: Denies: hot or cold intolerance, sweats, changes in hair or nails, polyuria, polydipsia. Musculoskeletal:  Denies myalgias, back pain, joint swelling, arthralgias and gait problem.  Skin: Denies pallor, rash and wound.  Neurological: Denies dizziness, seizures, syncope, weakness, light-headedness, numbness and headaches.  Hematological: Denies adenopathy. Easy bruising, personal or family bleeding history  Psychiatric/Behavioral: Denies suicidal ideation, mood changes, confusion, nervousness, sleep disturbance and agitation   Physical Exam: Blood pressure 146/69, pulse 69, temperature 98.6 F (37 C), temperature source Oral, resp. rate 18, height _0  (1.575 m), weight 45.36 kg (100 lb), SpO2 99.00%. General: Alert, awake, oriented x3, in no current distress. HEENT: Normocephalic, atraumatic, pupils equal round and reactive to light, chocolate movements intact, moist mucous membranes, wears corrective lenses. Neck: Supple, no JVD, no lymphadenopathy, no bruits, no goiter. Cardiovascular: Regular rate and rhythm, no murmurs, rubs or gallops. Lungs: Clear to auscultation Bilaterally. Abdomen: Soft, nontender, nondistended, positive bowel sounds, no masses or organomegaly noted. Extremities: No clubbing, cyanosis or edema, positive pedal pulses, obvious deformity of the right hip. Neurologic: Nonfocal, I have not ambulated her. 75 minutes  Labs on Admission:  Results for orders placed during the hospital encounter of 06/04/13 (from the past 48 hour(s))  BASIC METABOLIC PANEL     Status: Abnormal   Collection Time    06/04/13  1:20 PM      Result Value Ref Range   Sodium 127 (*) 137 - 147 mEq/L   Potassium 4.4  3.7 - 5.3 mEq/L   Chloride 90 (*) 96 - 112 mEq/L   CO2 23  19 - 32 mEq/L   Glucose, Bld 133 (*) 70 - 99 mg/dL   BUN 17  6 - 23 mg/dL   Creatinine, Ser 0.71  0.50 - 1.10 mg/dL   Calcium 8.5  8.4 - 10.5 mg/dL   GFR calc non Af Amer 75 (*) >90 mL/min   GFR calc Af Amer 87 (*) >90 mL/min   Comment: (NOTE)     The eGFR has been calculated using the CKD EPI equation.     This  calculation has not been validated in all clinical situations.     eGFR's persistently <90 mL/min signify possible Chronic Kidney     Disease.  CBC WITH DIFFERENTIAL     Status: Abnormal   Collection Time    06/04/13  1:20 PM      Result Value Ref Range   WBC 6.5  4.0 - 10.5 K/uL   RBC 3.64 (*) 3.87 - 5.11 MIL/uL   Hemoglobin 11.6 (*) 12.0 - 15.0 g/dL   HCT 33.8 (*) 36.0 - 46.0 %   MCV 92.9  78.0 - 100.0 fL   MCH 31.9  26.0 - 34.0 pg   MCHC 34.3  30.0 - 36.0 g/dL   RDW 14.8  11.5 - 15.5 %   Platelets 267  150 - 400 K/uL   Neutrophils Relative % 73  43 - 77 %   Neutro Abs 4.8  1.7 - 7.7 K/uL   Lymphocytes Relative 17  12 - 46 %   Lymphs Abs 1.1  0.7 - 4.0 K/uL   Monocytes Relative 9  3 - 12 %   Monocytes Absolute 0.6  0.1 - 1.0 K/uL   Eosinophils Relative 1  0 - 5 %   Eosinophils Absolute 0.0  0.0 - 0.7 K/uL   Basophils Relative 0  0 - 1 %   Basophils Absolute 0.0  0.0 - 0.1 K/uL  PROTIME-INR     Status: None   Collection Time    06/04/13  1:20 PM      Result Value Ref Range   Prothrombin Time 13.8  11.6 - 15.2 seconds   INR 1.08  0.00 - 1.49  TYPE AND SCREEN     Status: None   Collection Time    06/04/13  1:20 PM      Result Value Ref Range   ABO/RH(D) O POS     Antibody Screen NEG     Sample Expiration 06/07/2013    ABO/RH     Status: None   Collection Time    06/04/13  1:20 PM      Result Value Ref Range   ABO/RH(D) O POS    URINALYSIS, ROUTINE W REFLEX MICROSCOPIC     Status: Abnormal   Collection Time    06/04/13  1:59 PM      Result Value Ref Range   Color, Urine YELLOW  YELLOW   APPearance CLEAR  CLEAR   Specific Gravity, Urine 1.010  1.005 - 1.030   pH 7.0  5.0 - 8.0   Glucose, UA NEGATIVE  NEGATIVE mg/dL   Hgb urine dipstick TRACE (*) NEGATIVE   Bilirubin Urine NEGATIVE  NEGATIVE   Ketones, ur NEGATIVE  NEGATIVE mg/dL   Protein, ur NEGATIVE  NEGATIVE mg/dL   Urobilinogen, UA 0.2  0.0 - 1.0 mg/dL   Nitrite NEGATIVE  NEGATIVE   Leukocytes, UA TRACE (*)  NEGATIVE  URINE MICROSCOPIC-ADD ON     Status: None   Collection Time    06/04/13  1:59 PM      Result Value Ref Range   Squamous Epithelial / LPF RARE  RARE   WBC, UA 0-2  <3 WBC/hpf   RBC / HPF 0-2  <3 RBC/hpf   Bacteria, UA RARE  RARE    Radiological Exams on Admission: Dg Chest 1 View  06/04/2013   CLINICAL DATA:  Hip fracture  EXAM: CHEST - 1 VIEW  COMPARISON:  09/08/2006  FINDINGS: Cardiomediastinal silhouette is stable. No acute infiltrate or pulmonary edema. Mild hyperinflation. Stable chronic mild interstitial prominence. Central mild bronchitic changes.  IMPRESSION: No acute infiltrate or pulmonary edema. Mild hyperinflation. Stable chronic mild interstitial prominence.   Electronically Signed   By: Lahoma Crocker M.D.   On: 06/04/2013 14:08   Dg Hip Complete Right  06/04/2013   CLINICAL DATA:  Right hip pain post injury/fall  EXAM: RIGHT HIP - COMPLETE 2+ VIEW  COMPARISON:  None.  FINDINGS: Three views of the right hip submitted. There is displaced intertrochanteric fracture of proximal right femur. There is also medial displaced fracture fragment of lesser femoral trochanter.  IMPRESSION: Displaced intertrochanteric fracture of proximal right femur.   Electronically Signed   By: Lahoma Crocker M.D.   On: 06/04/2013 14:07    Assessment/Plan Principal Problem:   Hip fracture Active Problems:   Depression   Anxiety state, unspecified   HTN (hypertension)   Closed  right hip fracture    Closed right hip fracture -Status post mechanical fall. -Has already been seen by orthopedics with plans for repair tomorrow. -She is medically cleared to proceed with surgery.  Hypertension -Continue home medications.  Depression/anxiety -Continue psychotropic medications.  DVT prophylaxis -Subcutaneous heparin. -Orthopedics to determine post hip repair DVT prophylaxis.  Code Status -Full code.   Time Spent on Admission: 75 minutes  [facility] Pager: [phone] 06/04/2013, 5:46 PM

## 2013-06-04 NOTE — Consult Note (Signed)
Reason for Consult:Intertrochanteric Hip Fracture of Right Hip Referring Physician: Rashonda Warrior Baker is an 80 y.o. female.  HPI: Yolanda Baker at home when she tripped over her cane.  Past Medical History  Diagnosis Date  . Osteoporosis   . Hypertension   . Arthritis     neck  . Depression     Past Surgical History  Procedure Laterality Date  . Joint replacement    . Left hip    . Abdominal hysterectomy      History reviewed. No pertinent family history.  Social History:  reports that she has never smoked. She has never used smokeless tobacco. She reports that she does not drink alcohol or use illicit drugs.  Allergies:  Allergies  Allergen Reactions  . Flagyl [Metronidazole]     Fingers get "numb"    Medications: I have reviewed the patient's current medications.  Results for orders placed during the hospital encounter of 06/04/13 (from the past 48 hour(s))  BASIC METABOLIC PANEL     Status: Abnormal   Collection Time    06/04/13  1:20 PM      Result Value Ref Range   Sodium 127 (*) 137 - 147 mEq/L   Potassium 4.4  3.7 - 5.3 mEq/L   Chloride 90 (*) 96 - 112 mEq/L   CO2 23  19 - 32 mEq/L   Glucose, Bld 133 (*) 70 - 99 mg/dL   BUN 17  6 - 23 mg/dL   Creatinine, Ser 0.71  0.50 - 1.10 mg/dL   Calcium 8.5  8.4 - 10.5 mg/dL   GFR calc non Af Amer 75 (*) >90 mL/min   GFR calc Af Amer 87 (*) >90 mL/min   Comment: (NOTE)     The eGFR has been calculated using the CKD EPI equation.     This calculation has not been validated in all clinical situations.     eGFR's persistently <90 mL/min signify possible Chronic Kidney     Disease.  CBC WITH DIFFERENTIAL     Status: Abnormal   Collection Time    06/04/13  1:20 PM      Result Value Ref Range   WBC 6.5  4.0 - 10.5 K/uL   RBC 3.64 (*) 3.87 - 5.11 MIL/uL   Hemoglobin 11.6 (*) 12.0 - 15.0 g/dL   HCT 33.8 (*) 36.0 - 46.0 %   MCV 92.9  78.0 - 100.0 fL   MCH 31.9  26.0 - 34.0 pg   MCHC 34.3  30.0 - 36.0 g/dL    RDW 14.8  11.5 - 15.5 %   Platelets 267  150 - 400 K/uL   Neutrophils Relative % 73  43 - 77 %   Neutro Abs 4.8  1.7 - 7.7 K/uL   Lymphocytes Relative 17  12 - 46 %   Lymphs Abs 1.1  0.7 - 4.0 K/uL   Monocytes Relative 9  3 - 12 %   Monocytes Absolute 0.6  0.1 - 1.0 K/uL   Eosinophils Relative 1  0 - 5 %   Eosinophils Absolute 0.0  0.0 - 0.7 K/uL   Basophils Relative 0  0 - 1 %   Basophils Absolute 0.0  0.0 - 0.1 K/uL  PROTIME-INR     Status: None   Collection Time    06/04/13  1:20 PM      Result Value Ref Range   Prothrombin Time 13.8  11.6 - 15.2 seconds   INR 1.08  0.00 -  1.49  TYPE AND SCREEN     Status: None   Collection Time    06/04/13  1:20 PM      Result Value Ref Range   ABO/RH(D) O POS     Antibody Screen NEG     Sample Expiration 06/07/2013    ABO/RH     Status: None   Collection Time    06/04/13  1:20 PM      Result Value Ref Range   ABO/RH(D) O POS    URINALYSIS, ROUTINE W REFLEX MICROSCOPIC     Status: Abnormal   Collection Time    06/04/13  1:59 PM      Result Value Ref Range   Color, Urine YELLOW  YELLOW   APPearance CLEAR  CLEAR   Specific Gravity, Urine 1.010  1.005 - 1.030   pH 7.0  5.0 - 8.0   Glucose, UA NEGATIVE  NEGATIVE mg/dL   Hgb urine dipstick TRACE (*) NEGATIVE   Bilirubin Urine NEGATIVE  NEGATIVE   Ketones, ur NEGATIVE  NEGATIVE mg/dL   Protein, ur NEGATIVE  NEGATIVE mg/dL   Urobilinogen, UA 0.2  0.0 - 1.0 mg/dL   Nitrite NEGATIVE  NEGATIVE   Leukocytes, UA TRACE (*) NEGATIVE  URINE MICROSCOPIC-ADD ON     Status: None   Collection Time    06/04/13  1:59 PM      Result Value Ref Range   Squamous Epithelial / LPF RARE  RARE   WBC, UA 0-2  <3 WBC/hpf   RBC / HPF 0-2  <3 RBC/hpf   Bacteria, UA RARE  RARE    Dg Chest 1 View  06/04/2013   CLINICAL DATA:  Hip fracture  EXAM: CHEST - 1 VIEW  COMPARISON:  09/08/2006  FINDINGS: Cardiomediastinal silhouette is stable. No acute infiltrate or pulmonary edema. Mild hyperinflation. Stable  chronic mild interstitial prominence. Central mild bronchitic changes.  IMPRESSION: No acute infiltrate or pulmonary edema. Mild hyperinflation. Stable chronic mild interstitial prominence.   Electronically Signed   By: Lahoma Crocker M.D.   On: 06/04/2013 14:08   Dg Hip Complete Right  06/04/2013   CLINICAL DATA:  Right hip pain post injury/fall  EXAM: RIGHT HIP - COMPLETE 2+ VIEW  COMPARISON:  None.  FINDINGS: Three views of the right hip submitted. There is displaced intertrochanteric fracture of proximal right femur. There is also medial displaced fracture fragment of lesser femoral trochanter.  IMPRESSION: Displaced intertrochanteric fracture of proximal right femur.   Electronically Signed   By: Lahoma Crocker M.D.   On: 06/04/2013 14:07    Review of Systems  Constitutional: Negative.   HENT: Negative.   Eyes: Negative.   Respiratory: Negative.   Cardiovascular: Negative.   Gastrointestinal: Negative.   Genitourinary: Negative.   Musculoskeletal: Positive for falls and joint pain.       Rotatory deformity Right Lower  Skin:       Abraision Right Elbow.  Neurological: Negative.   Endo/Heme/Allergies: Negative.   Psychiatric/Behavioral: Negative.    Blood pressure 146/69, pulse 69, temperature 98.6 F (37 C), temperature source Oral, resp. rate 18, SpO2 99.00%. Physical Exam  Constitutional: She appears well-developed.  HENT:  Head: Normocephalic.  Eyes: Pupils are equal, round, and reactive to light.  Neck: Normal range of motion.  Cardiovascular: Normal rate.   Respiratory: Effort normal.  GI: Soft.  Musculoskeletal: She exhibits tenderness.  Obvious fracture of Right Hip. Hemiarthroplasty Left Hip  Skin:  Abraision of Right elbow.    Assessment/Plan:  Internal Fixation of Right Hip Fracture  Yolanda Baker 06/04/2013, 3:40 PM

## 2013-06-04 NOTE — ED Notes (Signed)
Per EMS; pt from [facility], pt had witnessed fall, no LOC. pt c/o right hip pain. Deformity to right hip and leg. 100 mcg fentanyl 20 g in right AC.

## 2013-06-04 NOTE — ED Provider Notes (Signed)
CSN: [id number]     Arrival date & time 06/04/13  1249 History   First MD Initiated Contact with Patient 06/04/13 1302     Chief Complaint  Patient presents with  . Fall  . Hip Pain    right     (Consider location/radiation/quality/duration/timing/severity/associated sxs/prior Treatment) HPI Comments: Patient with history of left displaced femoral neck fracture status post hemiarthroplasty in 2008 repaired by Dr. Lequita Halt -- presents with right hip pain that began acutely just prior to arrival when the patient on her cane and fell to the ground on her right side. Patient currently denies hitting her head, neck pain, low back pain. Patient was given fentanyl in route with improvement in pain. She denies numbness or tingling. Movement of her leg makes the pain worse. Patient is normally ambulatory with her cane. No h/o heart or lung problems.    Patient is a 80 y.o. female presenting with fall and hip pain. The history is provided by the patient, a relative and medical records.  Fall Associated symptoms include arthralgias. Pertinent negatives include no abdominal pain, chest pain, coughing, fever, headaches, myalgias, nausea, rash, sore throat or vomiting.  Hip Pain Associated symptoms include arthralgias. Pertinent negatives include no abdominal pain, chest pain, coughing, fever, headaches, myalgias, nausea, rash, sore throat or vomiting.    Past Medical History  Diagnosis Date  . Osteoporosis   . Hypertension   . Arthritis     neck  . Depression    Past Surgical History  Procedure Laterality Date  . Joint replacement    . Left hip     No family history on file. History  Substance Use Topics  . Smoking status: Never Smoker   . Smokeless tobacco: Never Used  . Alcohol Use: No   OB History   Grav Para Term Preterm Abortions TAB SAB Ect Mult Living                 Review of Systems  Constitutional: Negative for fever.  HENT: Negative for rhinorrhea and sore throat.     Eyes: Negative for redness.  Respiratory: Negative for cough.   Cardiovascular: Negative for chest pain, palpitations and leg swelling.  Gastrointestinal: Negative for nausea, vomiting, abdominal pain and diarrhea.  Genitourinary: Negative for dysuria.  Musculoskeletal: Positive for arthralgias. Negative for myalgias.  Skin: Negative for rash.  Neurological: Negative for headaches.      Allergies  Flagyl  Home Medications   Current Outpatient Rx  Name  Route  Sig  Dispense  Refill  . acetaminophen (TYLENOL) 500 MG chewable tablet   Oral   Chew 500 mg by mouth every 6 (six) hours as needed.         Marland Kitchen amLODipine (NORVASC) 5 MG tablet   Oral   Take 5 mg by mouth daily.         Marland Kitchen aspirin EC 81 MG tablet   Oral   Take 81 mg by mouth daily.         . calcium carbonate (OS-CAL) 600 MG TABS   Oral   Take 600 mg by mouth daily.         . cholecalciferol (VITAMIN D) 400 UNITS TABS   Oral   Take 400 Units by mouth daily.         . citalopram (CELEXA) 40 MG tablet   Oral   Take 40 mg by mouth daily.         Marland Kitchen galantamine (RAZADYNE ER) 8 MG  24 hr capsule   Oral   Take 8 mg by mouth daily with breakfast.         . glucosamine-chondroitin 500-400 MG tablet   Oral   Take 2 tablets by mouth daily.         . Ibuprofen (ADVIL) 200 MG CAPS   Oral   Take by mouth.         Marland Kitchen lisinopril (PRINIVIL,ZESTRIL) 20 MG tablet   Oral   Take 20 mg by mouth daily.         Marland Kitchen LORAZEPAM PO   Oral   Take by mouth.         . Multiple Vitamin (MULTIVITAMIN) tablet   Oral   Take 1 tablet by mouth daily.         . potassium chloride SA (K-DUR,KLOR-CON) 20 MEQ tablet   Oral   Take 20 mEq by mouth daily.         . psyllium (METAMUCIL) 58.6 % packet   Oral   Take 1 packet by mouth daily.         . ziprasidone (GEODON) 80 MG capsule   Oral   Take 80 mg by mouth daily.         . zoledronic acid (RECLAST) 5 MG/100ML SOLN   Intravenous   Inject 5 mg  into the vein once. Yearly by IV, for osteoporosis          BP 146/69  Pulse 69  Temp(Src) 98.6 F (37 C) (Oral)  Resp 18  SpO2 99%  Physical Exam  Nursing note and vitals reviewed. Constitutional: She appears well-developed and well-nourished.  HENT:  Head: Normocephalic and atraumatic.  Eyes: Conjunctivae are normal. Right eye exhibits no discharge. Left eye exhibits no discharge.  Neck: Normal range of motion. Neck supple.  Cardiovascular: Normal rate, regular rhythm and normal heart sounds.   No murmur heard. Pulses:      Dorsalis pedis pulses are 2+ on the right side, and 2+ on the left side.       Posterior tibial pulses are 2+ on the right side, and 2+ on the left side.  Pulmonary/Chest: Effort normal and breath sounds normal. No respiratory distress. She has no wheezes. She has no rales.  Abdominal: Soft. There is no tenderness. There is no rebound and no guarding.  Musculoskeletal:       Right hip: She exhibits decreased range of motion and decreased strength.       Left hip: Normal.       Right knee: Normal.       Right ankle: Normal.       Lumbar back: Normal.       Legs: Neurological: She is alert.  Skin: Skin is warm and dry.  Psychiatric: She has a normal mood and affect.    ED Course  Procedures (including critical care time) Labs Review Labs Reviewed  BASIC METABOLIC PANEL - Abnormal; Notable for the following:    Sodium 127 (*)    Chloride 90 (*)    Glucose, Bld 133 (*)    GFR calc non Af Amer 75 (*)    GFR calc Af Amer 87 (*)    All other components within normal limits  CBC WITH DIFFERENTIAL - Abnormal; Notable for the following:    RBC 3.64 (*)    Hemoglobin 11.6 (*)    HCT 33.8 (*)    All other components within normal limits  URINALYSIS, ROUTINE W REFLEX MICROSCOPIC -  Abnormal; Notable for the following:    Hgb urine dipstick TRACE (*)    Leukocytes, UA TRACE (*)    All other components within normal limits  PROTIME-INR  URINE  MICROSCOPIC-ADD ON  TYPE AND SCREEN  ABO/RH   Imaging Review Dg Chest 1 View  06/04/2013   CLINICAL DATA:  Hip fracture  EXAM: CHEST - 1 VIEW  COMPARISON:  09/08/2006  FINDINGS: Cardiomediastinal silhouette is stable. No acute infiltrate or pulmonary edema. Mild hyperinflation. Stable chronic mild interstitial prominence. Central mild bronchitic changes.  IMPRESSION: No acute infiltrate or pulmonary edema. Mild hyperinflation. Stable chronic mild interstitial prominence.   Electronically Signed   By: Natasha MeadLiviu  Pop M.D.   On: 06/04/2013 14:08   Dg Hip Complete Right  06/04/2013   CLINICAL DATA:  Right hip pain post injury/fall  EXAM: RIGHT HIP - COMPLETE 2+ VIEW  COMPARISON:  None.  FINDINGS: Three views of the right hip submitted. There is displaced intertrochanteric fracture of proximal right femur. There is also medial displaced fracture fragment of lesser femoral trochanter.  IMPRESSION: Displaced intertrochanteric fracture of proximal right femur.   Electronically Signed   By: Natasha MeadLiviu  Pop M.D.   On: 06/04/2013 14:07     EKG Interpretation None      1:12 PM Patient seen and examined. Work-up initiated. Medications ordered. D/w Dr. Micheline Mazeocherty who will see.   Vital signs reviewed and are as follows: Filed Vitals:   06/04/13 1258  BP: 146/69  Pulse: 69  Temp: 98.6 F (37 C)  Resp: 18   3:13 PM Spoke with Dr. Darrelyn HillockGioffre who will see in ED.   Pain controlled.   Spoke with Dr. Ardyth HarpsHernandez who will admit.   MDM   Final diagnoses:  Intertrochanteric fracture of right femur   Admit.     Renne CriglerJoshua Araiyah Cumpton, PA-C 06/04/13 1514

## 2013-06-04 NOTE — Progress Notes (Signed)
Patient was transferred from the ED before CSW could assess.  Patient will be followed up by CSW on unit.     Maryelizabeth Rowan, MSW, [place], 06/04/2013 Evening Clinical Social Worker [phone]

## 2013-06-04 NOTE — ED Notes (Signed)
Pt states she does not need pain meds at the time, told pt to let nurse know as soon as she needs relief  From pain.

## 2013-06-04 NOTE — Progress Notes (Signed)
Pt has a skin tear on the right, lateral forearm. Was able to approximate about 50% of the wound bed with skin that had rolled under. Wound cleansed and covered with petroleum dsg and wrapped in kerlex.

## 2013-06-05 ENCOUNTER — Inpatient Hospital Stay (HOSPITAL_COMMUNITY): Payer: Medicare Other

## 2013-06-05 ENCOUNTER — Encounter (HOSPITAL_COMMUNITY): Payer: Medicare Other | Admitting: Anesthesiology

## 2013-06-05 ENCOUNTER — Encounter (HOSPITAL_COMMUNITY)
Admission: EM | Disposition: A | Discharge status: Skilled Nursing Facility | Payer: Self-pay | Source: Home / Self Care | Attending: Internal Medicine

## 2013-06-05 ENCOUNTER — Inpatient Hospital Stay (HOSPITAL_COMMUNITY): Payer: Medicare Other | Admitting: Anesthesiology

## 2013-06-05 HISTORY — PX: INTRAMEDULLARY (IM) NAIL INTERTROCHANTERIC: SHX5875

## 2013-06-05 HISTORY — PX: FRACTURE SURGERY: SHX138

## 2013-06-05 LAB — BASIC METABOLIC PANEL
BUN: 13 mg/dL (ref 6–23)
CO2: 24 mEq/L (ref 19–32)
Calcium: 8.2 mg/dL — ABNORMAL LOW (ref 8.4–10.5)
Chloride: 95 mEq/L — ABNORMAL LOW (ref 96–112)
Creatinine, Ser: 0.75 mg/dL (ref 0.50–1.10)
GFR calc Af Amer: 85 mL/min — ABNORMAL LOW (ref >90)
GFR calc non Af Amer: 73 mL/min — ABNORMAL LOW (ref >90)
Glucose, Bld: 100 mg/dL — ABNORMAL HIGH (ref 70–99)
Potassium: 5.2 mEq/L (ref 3.7–5.3)
Sodium: 131 mEq/L — ABNORMAL LOW (ref 137–147)

## 2013-06-05 LAB — CBC
HCT: 35 % — ABNORMAL LOW (ref 36.0–46.0)
Hemoglobin: 11.9 g/dL — ABNORMAL LOW (ref 12.0–15.0)
MCH: 32 pg (ref 26.0–34.0)
MCHC: 34 g/dL (ref 30.0–36.0)
MCV: 94.1 fL (ref 78.0–100.0)
Platelets: 253 10*3/uL (ref 150–400)
RBC: 3.72 MIL/uL — ABNORMAL LOW (ref 3.87–5.11)
RDW: 15 % (ref 11.5–15.5)
WBC: 8.9 10*3/uL (ref 4.0–10.5)

## 2013-06-05 SURGERY — FIXATION, FRACTURE, INTERTROCHANTERIC, WITH INTRAMEDULLARY ROD
Anesthesia: General | Site: Hip | Laterality: Right

## 2013-06-05 MED ORDER — DEXAMETHASONE SODIUM PHOSPHATE 10 MG/ML IJ SOLN
INTRAMUSCULAR | Status: DC | PRN
  Administered 2013-06-05: 10 mg via INTRAVENOUS

## 2013-06-05 MED ORDER — SUFENTANIL CITRATE 50 MCG/ML IV SOLN
INTRAVENOUS | Status: AC
  Filled 2013-06-05: qty 1

## 2013-06-05 MED ORDER — ONDANSETRON HCL 4 MG/2ML IJ SOLN
INTRAMUSCULAR | Status: AC
  Filled 2013-06-05: qty 2

## 2013-06-05 MED ORDER — LACTATED RINGERS IV SOLN
INTRAVENOUS | Status: DC | PRN
  Administered 2013-06-05 (×2): via INTRAVENOUS

## 2013-06-05 MED ORDER — ROCURONIUM BROMIDE 100 MG/10ML IV SOLN
INTRAVENOUS | Status: DC | PRN
  Administered 2013-06-05: 20 mg via INTRAVENOUS

## 2013-06-05 MED ORDER — GLYCOPYRROLATE 0.2 MG/ML IJ SOLN
INTRAMUSCULAR | Status: AC
  Filled 2013-06-05: qty 3

## 2013-06-05 MED ORDER — ONDANSETRON HCL 4 MG/2ML IJ SOLN
INTRAMUSCULAR | Status: DC | PRN
  Administered 2013-06-05: 4 mg via INTRAVENOUS

## 2013-06-05 MED ORDER — LIDOCAINE HCL (CARDIAC) 20 MG/ML IV SOLN
INTRAVENOUS | Status: AC
  Filled 2013-06-05: qty 5

## 2013-06-05 MED ORDER — NEOSTIGMINE METHYLSULFATE 1 MG/ML IJ SOLN
INTRAMUSCULAR | Status: DC | PRN
  Administered 2013-06-05: 3 mg via INTRAVENOUS

## 2013-06-05 MED ORDER — LABETALOL HCL 5 MG/ML IV SOLN
INTRAVENOUS | Status: DC | PRN
  Administered 2013-06-05: 5 mg via INTRAVENOUS

## 2013-06-05 MED ORDER — LIP MEDEX EX OINT
TOPICAL_OINTMENT | CUTANEOUS | Status: AC
  Administered 2013-06-05: 1
  Filled 2013-06-05: qty 7

## 2013-06-05 MED ORDER — PROPOFOL 10 MG/ML IV BOLUS
INTRAVENOUS | Status: AC
  Filled 2013-06-05: qty 20

## 2013-06-05 MED ORDER — OXYCODONE HCL 5 MG PO TABS
5.0000 mg | ORAL_TABLET | ORAL | Status: DC | PRN
  Administered 2013-06-05 – 2013-06-08 (×3): 5 mg via ORAL
  Filled 2013-06-05 (×4): qty 1

## 2013-06-05 MED ORDER — LACTATED RINGERS IV SOLN: INTRAVENOUS | Status: DC

## 2013-06-05 MED ORDER — DEXAMETHASONE SODIUM PHOSPHATE 10 MG/ML IJ SOLN
INTRAMUSCULAR | Status: AC
  Filled 2013-06-05: qty 1

## 2013-06-05 MED ORDER — ACETAMINOPHEN 10 MG/ML IV SOLN
1000.0000 mg | Freq: Once | INTRAVENOUS | Status: AC
Start: 2013-06-05 — End: 2013-06-05
  Administered 2013-06-05: 1000 mg via INTRAVENOUS
  Filled 2013-06-05: qty 100

## 2013-06-05 MED ORDER — HYDROMORPHONE HCL PF 1 MG/ML IJ SOLN: 0.2500 mg | INTRAMUSCULAR | Status: DC | PRN

## 2013-06-05 MED ORDER — NEOSTIGMINE METHYLSULFATE 1 MG/ML IJ SOLN
INTRAMUSCULAR | Status: AC
  Filled 2013-06-05: qty 10

## 2013-06-05 MED ORDER — SODIUM CHLORIDE 0.9 % IV SOLN
INTRAVENOUS | Status: DC
  Administered 2013-06-05: 21:00:00 via INTRAVENOUS
  Administered 2013-06-07: 10 mL/h via INTRAVENOUS

## 2013-06-05 MED ORDER — METHOCARBAMOL 100 MG/ML IJ SOLN
500.0000 mg | Freq: Four times a day (QID) | INTRAVENOUS | Status: DC | PRN
  Administered 2013-06-05: 500 mg via INTRAVENOUS
  Filled 2013-06-05: qty 5

## 2013-06-05 MED ORDER — METHOCARBAMOL 500 MG PO TABS: 500.0000 mg | ORAL_TABLET | Freq: Four times a day (QID) | ORAL | Status: DC | PRN

## 2013-06-05 MED ORDER — ROCURONIUM BROMIDE 100 MG/10ML IV SOLN
INTRAVENOUS | Status: AC
  Filled 2013-06-05: qty 1

## 2013-06-05 MED ORDER — GALANTAMINE HYDROBROMIDE 4 MG PO TABS
4.0000 mg | ORAL_TABLET | Freq: Two times a day (BID) | ORAL | Status: DC
  Administered 2013-06-05 – 2013-06-08 (×6): 4 mg via ORAL
  Filled 2013-06-05 (×9): qty 1

## 2013-06-05 MED ORDER — FENTANYL CITRATE 0.05 MG/ML IJ SOLN
INTRAMUSCULAR | Status: AC
  Filled 2013-06-05: qty 5

## 2013-06-05 MED ORDER — SUCCINYLCHOLINE CHLORIDE 20 MG/ML IJ SOLN
INTRAMUSCULAR | Status: DC | PRN
  Administered 2013-06-05: 80 mg via INTRAVENOUS

## 2013-06-05 MED ORDER — CEFAZOLIN SODIUM-DEXTROSE 2-3 GM-% IV SOLR
2.0000 g | Freq: Once | INTRAVENOUS | Status: AC
  Administered 2013-06-05: 2 g via INTRAVENOUS
  Filled 2013-06-05: qty 50

## 2013-06-05 MED ORDER — PROPOFOL 10 MG/ML IV BOLUS
INTRAVENOUS | Status: DC | PRN
  Administered 2013-06-05: 80 mg via INTRAVENOUS

## 2013-06-05 MED ORDER — ONDANSETRON HCL 4 MG/2ML IJ SOLN: 4.0000 mg | Freq: Four times a day (QID) | INTRAMUSCULAR | Status: DC | PRN

## 2013-06-05 MED ORDER — GLYCOPYRROLATE 0.2 MG/ML IJ SOLN
INTRAMUSCULAR | Status: DC | PRN
  Administered 2013-06-05: .6 mg via INTRAVENOUS

## 2013-06-05 MED ORDER — METOCLOPRAMIDE HCL 5 MG/ML IJ SOLN: 5.0000 mg | Freq: Three times a day (TID) | INTRAMUSCULAR | Status: DC | PRN

## 2013-06-05 MED ORDER — POLYETHYLENE GLYCOL 3350 17 G PO PACK
17.0000 g | PACK | Freq: Every day | ORAL | Status: DC | PRN
  Administered 2013-06-07: 17 g via ORAL

## 2013-06-05 MED ORDER — ENOXAPARIN SODIUM 40 MG/0.4ML ~~LOC~~ SOLN
40.0000 mg | SUBCUTANEOUS | Status: DC
  Administered 2013-06-06 – 2013-06-08 (×3): 40 mg via SUBCUTANEOUS
  Filled 2013-06-05 (×5): qty 0.4

## 2013-06-05 MED ORDER — DOCUSATE SODIUM 100 MG PO CAPS
100.0000 mg | ORAL_CAPSULE | Freq: Two times a day (BID) | ORAL | Status: DC
  Administered 2013-06-05 – 2013-06-08 (×6): 100 mg via ORAL

## 2013-06-05 MED ORDER — METOCLOPRAMIDE HCL 10 MG PO TABS: 5.0000 mg | ORAL_TABLET | Freq: Three times a day (TID) | ORAL | Status: DC | PRN

## 2013-06-05 MED ORDER — BISACODYL 10 MG RE SUPP: 10.0000 mg | Freq: Every day | RECTAL | Status: DC | PRN

## 2013-06-05 MED ORDER — CITALOPRAM HYDROBROMIDE 20 MG PO TABS
20.0000 mg | ORAL_TABLET | Freq: Every day | ORAL | Status: DC
  Administered 2013-06-06 – 2013-06-08 (×3): 20 mg via ORAL
  Filled 2013-06-05 (×3): qty 1

## 2013-06-05 MED ORDER — ONDANSETRON HCL 4 MG PO TABS: 4.0000 mg | ORAL_TABLET | Freq: Four times a day (QID) | ORAL | Status: DC | PRN

## 2013-06-05 MED ORDER — 0.9 % SODIUM CHLORIDE (POUR BTL) OPTIME
TOPICAL | Status: DC | PRN
  Administered 2013-06-05: 1000 mL

## 2013-06-05 MED ORDER — FLEET ENEMA 7-19 GM/118ML RE ENEM: 1.0000 | ENEMA | Freq: Once | RECTAL | Status: AC | PRN

## 2013-06-05 MED ORDER — ACETAMINOPHEN 650 MG RE SUPP: 650.0000 mg | Freq: Four times a day (QID) | RECTAL | Status: DC | PRN

## 2013-06-05 MED ORDER — ACETAMINOPHEN 325 MG PO TABS: 650.0000 mg | ORAL_TABLET | Freq: Four times a day (QID) | ORAL | Status: DC | PRN

## 2013-06-05 MED ORDER — PHENOL 1.4 % MT LIQD: 1.0000 | OROMUCOSAL | Status: DC | PRN

## 2013-06-05 MED ORDER — MENTHOL 3 MG MT LOZG: 1.0000 | LOZENGE | OROMUCOSAL | Status: DC | PRN

## 2013-06-05 MED ORDER — FENTANYL CITRATE 0.05 MG/ML IJ SOLN
INTRAMUSCULAR | Status: DC | PRN
  Administered 2013-06-05: 50 ug via INTRAVENOUS
  Administered 2013-06-05: 25 ug via INTRAVENOUS
  Administered 2013-06-05: 50 ug via INTRAVENOUS
  Administered 2013-06-05 (×3): 25 ug via INTRAVENOUS

## 2013-06-05 MED ORDER — CEFAZOLIN SODIUM-DEXTROSE 2-3 GM-% IV SOLR
2.0000 g | Freq: Four times a day (QID) | INTRAVENOUS | Status: AC
  Administered 2013-06-05 – 2013-06-06 (×2): 2 g via INTRAVENOUS
  Filled 2013-06-05 (×2): qty 50

## 2013-06-05 MED ORDER — MORPHINE SULFATE 2 MG/ML IJ SOLN: 1.0000 mg | INTRAMUSCULAR | Status: DC | PRN

## 2013-06-05 SURGICAL SUPPLY — 38 items
BAG ZIPLOCK 12X15 (MISCELLANEOUS) ×2 IMPLANT
BANDAGE ELASTIC 6 VELCRO ST LF (GAUZE/BANDAGES/DRESSINGS) ×2 IMPLANT
BIT DRILL CANN LG 4.3MM (BIT) ×1 IMPLANT
DRAPE EXTREMITY T 121X128X90 (DRAPE) ×2 IMPLANT
DRAPE STERI IOBAN 125X83 (DRAPES) ×2 IMPLANT
DRILL BIT CANN LG 4.3MM (BIT) ×2
DRSG ADAPTIC 3X8 NADH LF (GAUZE/BANDAGES/DRESSINGS) ×2 IMPLANT
DRSG MEPILEX BORDER 4X4 (GAUZE/BANDAGES/DRESSINGS) ×2 IMPLANT
DRSG MEPILEX BORDER 4X8 (GAUZE/BANDAGES/DRESSINGS) ×2 IMPLANT
DRSG PAD ABDOMINAL 8X10 ST (GAUZE/BANDAGES/DRESSINGS) ×2 IMPLANT
DURAPREP 26ML APPLICATOR (WOUND CARE) ×2 IMPLANT
GLOVE BIO SURGEON STRL SZ7.5 (GLOVE) ×2 IMPLANT
GLOVE BIO SURGEON STRL SZ8 (GLOVE) ×2 IMPLANT
GLOVE BIOGEL PI IND STRL 8 (GLOVE) ×3 IMPLANT
GLOVE BIOGEL PI INDICATOR 8 (GLOVE) ×3
GOWN STRL REUS W/TWL LRG LVL3 (GOWN DISPOSABLE) ×2 IMPLANT
GOWN STRL REUS W/TWL XL LVL3 (GOWN DISPOSABLE) ×2 IMPLANT
GUIDEPIN 3.2X17.5 THRD DISP (PIN) ×2 IMPLANT
HIP FRA NAIL LAG SCREW 10.5X90 (Orthopedic Implant) ×2 IMPLANT
KIT BASIN OR (CUSTOM PROCEDURE TRAY) ×2 IMPLANT
MANIFOLD NEPTUNE II (INSTRUMENTS) ×2 IMPLANT
NAIL HIP FRACT 130D 11X180 (Screw) ×2 IMPLANT
NS IRRIG 1000ML POUR BTL (IV SOLUTION) ×2 IMPLANT
PACK TOTAL JOINT (CUSTOM PROCEDURE TRAY) ×2 IMPLANT
POSITIONER SURGICAL ARM (MISCELLANEOUS) ×2 IMPLANT
SCREW BONE CORTICAL 5.0X36 (Screw) ×2 IMPLANT
SCREW LAG HIP FRA NAIL 10.5X90 (Orthopedic Implant) ×1 IMPLANT
SET PAD KNEE POSITIONER (MISCELLANEOUS) ×2 IMPLANT
SPONGE GAUZE 4X4 12PLY (GAUZE/BANDAGES/DRESSINGS) ×4 IMPLANT
STAPLER VISISTAT 35W (STAPLE) ×2 IMPLANT
STRIP CLOSURE SKIN 1/2X4 (GAUZE/BANDAGES/DRESSINGS) ×4 IMPLANT
SUT VIC AB 0 CT1 27 (SUTURE) ×1
SUT VIC AB 0 CT1 27XBRD ANTBC (SUTURE) ×1 IMPLANT
SUT VIC AB 2-0 CT1 27 (SUTURE) ×2
SUT VIC AB 2-0 CT1 TAPERPNT 27 (SUTURE) ×2 IMPLANT
TOWEL OR 17X26 10 PK STRL BLUE (TOWEL DISPOSABLE) ×4 IMPLANT
TRAY FOLEY CATH 14FRSI W/METER (CATHETERS) ×2 IMPLANT
WATER STERILE IRR 1500ML POUR (IV SOLUTION) ×2 IMPLANT

## 2013-06-05 NOTE — Progress Notes (Signed)
Subjective: Pt known to me from hip fracture requiring hemiarthroplasty several years ago. Fell yesterday sustaining right intertroch/subtroch femur fracture. Currently comfortable in bed  Objective: Vital signs in last 24 hours: Temp:  [98.3 F (36.8 C)-99.5 F (37.5 C)] 99.5 F (37.5 C) (04/10 0981) Pulse Rate:  [66-86] 66 (04/10 0633) Resp:  [16-20] 16 (04/10 0730) BP: (146-163)/(62-75) 163/62 mmHg (04/10 0633) SpO2:  [95 %-100 %] 100 % (04/10 1914) Weight:  [100 lb (45.36 kg)] 100 lb (45.36 kg) (04/09 1529)  Intake/Output from previous day: 04/09 0701 - 04/10 0700 In: 650 [I.V.:650] Out: 2275 [Urine:2275] Intake/Output this shift:     Recent Labs  06/04/13 1320 06/05/13 0500  HGB 11.6* 11.9*    Recent Labs  06/04/13 1320 06/05/13 0500  WBC 6.5 8.9  RBC 3.64* 3.72*  HCT 33.8* 35.0*  PLT 267 253    Recent Labs  06/04/13 1320 06/05/13 0500  NA 127* 131*  K 4.4 5.2  CL 90* 95*  CO2 23 24  BUN 17 13  CREATININE 0.71 0.75  GLUCOSE 133* 100*  CALCIUM 8.5 8.2*    Recent Labs  06/04/13 1320  INR 1.08    RLE shortened and externally rotated. Wiggles toes. Toes pink  Assessment/Plan: Right femur fracture- Plan OR this afternoon for IM nail. Discussed with patient who elects to proceed.   Gus Rankin Alia Parsley 06/05/2013, 8:09 AM

## 2013-06-05 NOTE — Anesthesia Postprocedure Evaluation (Signed)
  Anesthesia Post-op Note  Patient: Yolanda Baker  Procedure(s) Performed: Procedure(s) (LRB): INTRAMEDULLARY (IM) NAIL INTERTROCHANTRIC (Right)  Patient Location: PACU  Anesthesia Type: General  Level of Consciousness: awake and alert   Airway and Oxygen Therapy: Patient Spontanous Breathing  Post-op Pain: mild  Post-op Assessment: Post-op Vital signs reviewed, Patient's Cardiovascular Status Stable, Respiratory Function Stable, Patent Airway and No signs of Nausea or vomiting  Last Vitals:  Filed Vitals:   06/05/13 1915  BP:   Pulse:   Temp: 36.6 C  Resp:     Post-op Vital Signs: stable   Complications: No apparent anesthesia complications

## 2013-06-05 NOTE — Op Note (Signed)
  OPERATIVE REPORT  PREOPERATIVE DIAGNOSIS: Right intertrochanteric/subtrochanteric femur fracture.   POSTOP DIAGNOSIS: Right intertrochanteric/subtrochanteric femur fracture.   PROCEDURE: Intramedullary nailing, Right intertrochanteric/subtrochanteric femur  fracture.   SURGEON: Ollen Gross, M.D.   ASSISTANT: None  ANESTHESIA:General  Estimated BLOOD LOSS: 200 ml  DRAINS: None.   COMPLICATIONS:   None  CONDITION: -PACU - hemodynamically stable.    CLINICAL NOTE: Yolanda Baker is an 80 y.o. female, who had a fall yesterday   sustaining a displaced  Right intertrochanteric/subtrochanteric femur fracture. She has been cleared medically and presents for surgical fixation.    PROCEDURE IN DETAIL: After successful administration of  General,  the patient was placed on the fracture table with Right lower extremity in a well-padded traction boot,  Left lower extremity in a well-padded leg holder. Under fluoroscopic guidance, the fracture was reduced. The traction was locked in this position. Thigh was prepped  and draped in the usual sterile fashion. The guide pin for the Biomet  Affixus was then passed percutaneously to the tip of the greater  trochanter, was entered into the femoral canal. It was passed into the  canal. The small incision was made and the starter reamer passed over  the guide pin. This was then removed. The nail which was an 11 mm  diameter short trochanteric nail with 130 degrees angle was attached to  the external guide and then passed into the femoral canal, impacted to  the appropriate depth in the canal, then we used the external guide to  place the lag screw. Through the external guide, a guide pin was  passed. Small incision made, and the guide pin was in the center of the  femoral head on the AP and slightly center to posterior on the lateral.  Length was 90 mm. Triple reamer was passed over the guide pin. 90 mm  lag screw was placed. It was then locked  down with a locking screw.  Through the external guide, the distal interlock was placed through the  static hole and this was 36 mm in length with excellent bicortical  purchase. The external guide was then removed. Hardware was in good  position and fracture was well reduced. Wound was copiously irrigated with saline  solution, and  closed deep with interrupted 1 Vicryl, subcu  interrupted 2-0 Vicryl, subcuticular running 4-0 Monocryl. Incision was  cleaned and dried and sterile dressings applied. She was awakened and  transported to recovery in stable condition.   Gus Rankin Kayne Yuhas, MD    06/05/2013, 6:21 PM

## 2013-06-05 NOTE — Anesthesia Preprocedure Evaluation (Addendum)
Anesthesia Evaluation  Patient identified by MRN, date of birth, ID band Patient awake    Reviewed: Allergy & Precautions, H&P , NPO status , Patient's Chart, lab work & pertinent test results  Airway Mallampati: II TM Distance: >3 FB Neck ROM: full    Dental no notable dental hx. (+) Teeth Intact, Dental Advisory Given   Pulmonary neg pulmonary ROS,  breath sounds clear to auscultation  Pulmonary exam normal       Cardiovascular Exercise Tolerance: Good hypertension, Pt. on medications Rhythm:regular Rate:Normal     Neuro/Psych negative neurological ROS  negative psych ROS   GI/Hepatic negative GI ROS, Neg liver ROS,   Endo/Other  negative endocrine ROS  Renal/GU negative Renal ROS  negative genitourinary   Musculoskeletal   Abdominal   Peds  Hematology negative hematology ROS (+)   Anesthesia Other Findings   Reproductive/Obstetrics negative OB ROS                          Anesthesia Physical Anesthesia Plan  ASA: III  Anesthesia Plan: General   Post-op Pain Management:    Induction: Intravenous  Airway Management Planned: Oral ETT  Additional Equipment:   Intra-op Plan:   Post-operative Plan: Extubation in OR  Informed Consent: I have reviewed the patients History and Physical, chart, labs and discussed the procedure including the risks, benefits and alternatives for the proposed anesthesia with the patient or authorized representative who has indicated his/her understanding and acceptance.   Dental Advisory Given  Plan Discussed with: CRNA and Surgeon  Anesthesia Plan Comments:         Anesthesia Quick Evaluation

## 2013-06-05 NOTE — Progress Notes (Signed)
Clinical Social Work Department CLINICAL SOCIAL WORK PLACEMENT NOTE 06/05/2013  Patient:  CHARRISE, LEMAN  Account Number:  [id number] Admit date:  06/04/2013  Clinical Social Worker:  Cori Razor, LCSW  Date/time:  06/05/2013 10:13 AM  Clinical Social Work is seeking post-discharge placement for this patient at the following level of care:   SKILLED NURSING   (*CSW will update this form in Epic as items are completed)   06/05/2013  Patient/family provided with [facility] Department of Clinical Social Work's list of facilities offering this level of care within the geographic area requested by the patient (or if unable, by the patient's family).  06/05/2013  Patient/family informed of their freedom to choose among providers that offer the needed level of care, that participate in Medicare, Medicaid or managed care program needed by the patient, have an available bed and are willing to accept the patient.    Patient/family informed of [facility]' ownership interest in [facility], as well as of the fact that they are under no obligation to receive care at this facility.  PASARR submitted to EDS on 06/05/2013 PASARR number received from EDS on 06/05/2013  FL2 transmitted to all facilities in geographic area requested by pt/family on  06/05/2013 FL2 transmitted to all facilities within larger geographic area on   Patient informed that his/her managed care company has contracts with or will negotiate with  certain facilities, including the following:     Patient/family informed of bed offers received:   Patient chooses bed at  Physician recommends and patient chooses bed at    Patient to be transferred to  on   Patient to be transferred to facility by   The following physician request were entered in Epic:   Additional Comments:  Cori Razor LCSW [phone]

## 2013-06-05 NOTE — Interval H&P Note (Signed)
History and Physical Interval Note:  06/05/2013 4:35 PM  Yolanda Baker  has presented today for surgery, with the diagnosis of RIGHT HIP FRACTURE  The various methods of treatment have been discussed with the patient and family. After consideration of risks, benefits and other options for treatment, the patient has consented to  Procedure(s): INTRAMEDULLARY (IM) NAIL INTERTROCHANTRIC (Right) as a surgical intervention .  The patient's history has been reviewed, patient examined, no change in status, stable for surgery.  I have reviewed the patient's chart and labs.  Questions were answered to the patient's satisfaction.     Gus Rankin Audreyanna Butkiewicz

## 2013-06-05 NOTE — Transfer of Care (Signed)
Immediate Anesthesia Transfer of Care Note  Patient: Yolanda Baker  Procedure(s) Performed: Procedure(s) (LRB): INTRAMEDULLARY (IM) NAIL INTERTROCHANTRIC (Right)  Patient Location: PACU  Anesthesia Type: General  Level of Consciousness: sedated, patient cooperative and responds to stimulation  Airway & Oxygen Therapy: Patient Spontanous Breathing and Patient connected to face mask oxgen  Post-op Assessment: Report given to PACU RN and Post -op Vital signs reviewed and stable  Post vital signs: Reviewed and stable  Complications: No apparent anesthesia complications

## 2013-06-05 NOTE — Progress Notes (Signed)
Clinical Social Work Department BRIEF PSYCHOSOCIAL ASSESSMENT 06/05/2013  Patient:  Yolanda Baker, Yolanda Baker     Account Number:  [id number]     Admit date:  06/04/2013  Clinical Social Worker:  Lacie Scotts  Date/Time:  06/05/2013 10:01 AM  Referred by:  Physician  Date Referred:  06/05/2013 Referred for  SNF Placement   Other Referral:   Interview type:  Patient Other interview type:    PSYCHOSOCIAL DATA Living Status:  FACILITY Admitted from facility:  [facility] Level of care:  Independent Living Primary support name:  Candy Reavis Primary support relationship to patient:  CHILD, ADULT Degree of support available:   supportive    CURRENT CONCERNS Current Concerns  Post-Acute Placement   Other Concerns:    SOCIAL WORK ASSESSMENT / PLAN Pt is an 80 yr old female admitted from [facility] after falling and fx her hip. Pt is scheduled for surgery later today. CSW met with pt to assist with d/c planning. ST Rehab will be needed following hospital d/c. SNF search has been initiated . Pt's daughter will be visiting this afternoon. CSW will meet with pt / daughter to provide bed offers and continue assisting with d/c planning.   Assessment/plan status:  Psychosocial Support/Ongoing Assessment of Needs Other assessment/ plan:   Information/referral to community resources:   SNF list provided.    PATIENT'S/FAMILY'S RESPONSE TO PLAN OF CARE: Pt appears calm . She is patiently awaiting surgery. Pt feels rehab will be needed before returning to independent living. Pt  appreciates CSW willing to meet with her and family to assist with d/c planning.   Werner Lean LCSW [phone]

## 2013-06-05 NOTE — Progress Notes (Signed)
[facility] PROGRESS NOTE  Yolanda Baker ZOX:[id number] DOB: Nov 28, 1923 DOA: 06/04/2013 PCP: Ezequiel KayserPERINI,MARK A, MD  Assessment/Plan: Right Hip Fracture -For repair today. -Will likely need SNF afterwards for rehab.  HTN -Continue home meds.  Depression/Anxiety -Continue psychotropic meds.  Code Status: Full  Family Communication: Daughter  Disposition Plan: SNF   Consultants:  Ortho   Antibiotics:  none   Subjective: No complaints, comfortable  Objective: Filed Vitals:   06/05/13 0730 06/05/13 1200 06/05/13 1351 06/05/13 1520  BP:   148/59 147/64  Pulse:   67 71  Temp:   98.7 F (37.1 C) 99.3 F (37.4 C)  TempSrc:   Oral Oral  Resp: 16 18 16 16   Height:      Weight:      SpO2:   97% 100%    Intake/Output Summary (Last 24 hours) at 06/05/13 1630 Last data filed at 06/05/13 1521  Gross per 24 hour  Intake   1000 ml  Output   3075 ml  Net  -2075 ml   Filed Weights   06/04/13 1529  Weight: 45.36 kg (100 lb)    Exam:   General:  AA Ox3  Cardiovascular: RRR  Respiratory: CTA B  Abdomen: S/NT/ND/+BS  Extremities: no C/C/E   Neurologic:  Non-focal  Data Reviewed: Basic Metabolic Panel:  Recent Labs Lab 06/04/13 1320 06/05/13 0500  NA 127* 131*  K 4.4 5.2  CL 90* 95*  CO2 23 24  GLUCOSE 133* 100*  BUN 17 13  CREATININE 0.71 0.75  CALCIUM 8.5 8.2*   Liver Function Tests: No results found for this basename: AST, ALT, ALKPHOS, BILITOT, PROT, ALBUMIN,  in the last 168 hours No results found for this basename: LIPASE, AMYLASE,  in the last 168 hours No results found for this basename: AMMONIA,  in the last 168 hours CBC:  Recent Labs Lab 06/04/13 1320 06/05/13 0500  WBC 6.5 8.9  NEUTROABS 4.8  --   HGB 11.6* 11.9*  HCT 33.8* 35.0*  MCV 92.9 94.1  PLT 267 253   Cardiac Enzymes: No results found for this basename: CKTOTAL, CKMB, CKMBINDEX, TROPONINI,  in the last 168 hours BNP (last 3 results) No results found for  this basename: PROBNP,  in the last 8760 hours CBG: No results found for this basename: GLUCAP,  in the last 168 hours  No results found for this or any previous visit (from the past 240 hour(s)).   Studies: Dg Chest 1 View  06/04/2013   CLINICAL DATA:  Hip fracture  EXAM: CHEST - 1 VIEW  COMPARISON:  09/08/2006  FINDINGS: Cardiomediastinal silhouette is stable. No acute infiltrate or pulmonary edema. Mild hyperinflation. Stable chronic mild interstitial prominence. Central mild bronchitic changes.  IMPRESSION: No acute infiltrate or pulmonary edema. Mild hyperinflation. Stable chronic mild interstitial prominence.   Electronically Signed   By: Natasha MeadLiviu  Pop M.D.   On: 06/04/2013 14:08   Dg Hip Complete Right  06/04/2013   CLINICAL DATA:  Right hip pain post injury/fall  EXAM: RIGHT HIP - COMPLETE 2+ VIEW  COMPARISON:  None.  FINDINGS: Three views of the right hip submitted. There is displaced intertrochanteric fracture of proximal right femur. There is also medial displaced fracture fragment of lesser femoral trochanter.  IMPRESSION: Displaced intertrochanteric fracture of proximal right femur.   Electronically Signed   By: Natasha MeadLiviu  Pop M.D.   On: 06/04/2013 14:07    Scheduled Meds: . [facility][MAR HOLD] amLODipine  2.5 mg Oral Daily  . [  MAR HOLD]  ceFAZolin (ANCEF) IV  2 g Intravenous Once  . [facility] HOLD] citalopram  20 mg Oral Daily  . [facility] HOLD] galantamine  4 mg Oral BID WC  . [MAR HOLD] heparin  5,000 Units Subcutaneous 3 times per day  . [MAR HOLD] lisinopril  10 mg Oral Daily  . [MAR HOLD] LORazepam  0.5 mg Oral QHS  . [MAR HOLD] multivitamin with minerals  1 tablet Oral Daily  . [MAR HOLD] psyllium  1 packet Oral QID  . [facility] HOLD] ziprasidone  80 mg Oral Daily   Continuous Infusions: . sodium chloride 50 mL/hr at 06/04/13 1800    Principal Problem:   Hip fracture Active Problems:   Depression   Anxiety state, unspecified   HTN (hypertension)   Closed right hip fracture    Time spent:  25 minutes. Greater than 50% of this time was spent in direct contact with the patient coordinating care.    Henderson Cloud  [facility] Pager [phone]  If 7PM-7AM, please contact night-coverage at [email or web address], password [facility] 06/05/2013, 4:30 PM  LOS: 1 day

## 2013-06-06 LAB — BASIC METABOLIC PANEL
BUN: 14 mg/dL (ref 6–23)
CO2: 24 mEq/L (ref 19–32)
Calcium: 7.6 mg/dL — ABNORMAL LOW (ref 8.4–10.5)
Chloride: 96 mEq/L (ref 96–112)
Creatinine, Ser: 0.78 mg/dL (ref 0.50–1.10)
GFR calc Af Amer: 84 mL/min — ABNORMAL LOW (ref >90)
GFR calc non Af Amer: 73 mL/min — ABNORMAL LOW (ref >90)
Glucose, Bld: 152 mg/dL — ABNORMAL HIGH (ref 70–99)
Potassium: 3.9 mEq/L (ref 3.7–5.3)
Sodium: 131 mEq/L — ABNORMAL LOW (ref 137–147)

## 2013-06-06 LAB — CBC
HCT: 28.3 % — ABNORMAL LOW (ref 36.0–46.0)
Hemoglobin: 9.4 g/dL — ABNORMAL LOW (ref 12.0–15.0)
MCH: 31.9 pg (ref 26.0–34.0)
MCHC: 33.2 g/dL (ref 30.0–36.0)
MCV: 95.9 fL (ref 78.0–100.0)
Platelets: 218 10*3/uL (ref 150–400)
RBC: 2.95 MIL/uL — ABNORMAL LOW (ref 3.87–5.11)
RDW: 15.3 % (ref 11.5–15.5)
WBC: 9.4 10*3/uL (ref 4.0–10.5)

## 2013-06-06 MED ORDER — LORAZEPAM 0.5 MG PO TABS
0.5000 mg | ORAL_TABLET | Freq: Two times a day (BID) | ORAL | Status: DC | PRN
  Administered 2013-06-06 – 2013-06-08 (×4): 0.5 mg via ORAL
  Filled 2013-06-06 (×4): qty 1

## 2013-06-06 NOTE — Progress Notes (Signed)
OT Cancellation Note  Patient Details Name: Yolanda Baker MRN: [id number] DOB: 10/11/1923   Cancelled Treatment:    Reason Eval/Treat Not Completed: Other (comment). Pt's current D/C plan is SNF. No apparent immediate acute care OT needs, therefore will defer OT to SNF. If OT eval is needed please call Acute Rehab Dept. at [phone] or text page OT at [phone].    Evette Georges [phone] 06/06/2013, 9:36 AM

## 2013-06-06 NOTE — Evaluation (Signed)
Physical Therapy Evaluation Patient Details Name: Yolanda Baker MRN: [id number] DOB: September 26, 1923 Today's Date: 06/06/2013   History of Present Illness  pt is s/p INTRAMEDULLARY (IM) NAIL INTERTROCHANTRIC (Right)  Clinical Impression  Pt presents requiring max A for mobility and gait, will benefit from skilled PT services to address deficits and decrease burden of care for d/c to SNF    Follow Up Recommendations SNF    Equipment Recommendations  None recommended by PT    Recommendations for Other Services       Precautions / Restrictions Restrictions RLE Weight Bearing: Weight bearing as tolerated      Mobility  Bed Mobility Overal bed mobility: Needs Assistance Bed Mobility: Supine to Sit     Supine to sit: Mod assist     General bed mobility comments: assist for LEs and trunk  Transfers Overall transfer level: Needs assistance   Transfers: Sit to/from Stand;Stand Pivot Transfers Sit to Stand: Max assist Stand pivot transfers: Max assist       General transfer comment: max A due to posterior lean, difficulty flexing B knees to stand, max A to maintain balance and wt shift to pivot into recliner  Ambulation/Gait                Stairs            Wheelchair Mobility    Modified Rankin (Stroke Patients Only)       Balance Overall balance assessment: Needs assistance           Standing balance-Leahy Scale: Poor Standing balance comment: max A with RW                             Pertinent Vitals/Pain Pt c/o R LE pain with mobility, RN made aware, ice applied after session    Home Living Family/patient expects to be discharged to:: Skilled nursing facility                      Prior Function Level of Independence: Independent with assistive device(s)               Hand Dominance        Extremity/Trunk Assessment   Upper Extremity Assessment: Generalized weakness           Lower Extremity  Assessment: Generalized weakness      Cervical / Trunk Assessment: Kyphotic  Communication   Communication: No difficulties  Cognition Arousal/Alertness: Awake/alert Behavior During Therapy: WFL for tasks assessed/performed Overall Cognitive Status: Within Functional Limits for tasks assessed                      General Comments      Exercises        Assessment/Plan    PT Assessment Patient needs continued PT services  PT Diagnosis Difficulty walking;Generalized weakness;Acute pain   PT Problem List Decreased strength;Decreased mobility;Decreased safety awareness;Decreased activity tolerance;Decreased balance;Decreased knowledge of use of DME;Pain  PT Treatment Interventions Gait training;Therapeutic exercise;Patient/family education;DME instruction;Therapeutic activities;Modalities;Stair training;Balance training;Wheelchair mobility training;Neuromuscular re-education;Functional mobility training   PT Goals (Current goals can be found in the Care Plan section) Acute Rehab PT Goals Patient Stated Goal: none stated PT Goal Formulation: With patient Time For Goal Achievement: 06/13/13 Potential to Achieve Goals: Good    Frequency Min 3X/week   Barriers to discharge        Co-evaluation  End of Session Equipment Utilized During Treatment: Gait belt Activity Tolerance: Patient limited by fatigue Patient left: in chair;with call bell/phone within reach;with family/visitor present Nurse Communication: Mobility status         Time: 0920-0940 PT Time Calculation (min): 20 min   Charges:   PT Evaluation $Initial PT Evaluation Tier I: 1 Procedure PT Treatments $Therapeutic Activity: 8-22 mins   PT G Codes:          [name] 06/06/2013, 12:30 PM

## 2013-06-06 NOTE — Progress Notes (Signed)
[facility] PROGRESS NOTE  Yolanda Baker [id number] DOB: September 22, 1923 DOA: 06/04/2013 PCP: Ezequiel KayserPERINI,MARK A, MD  Assessment/Plan: Right Hip Fracture -For repair 4/10. -Will need SNF afterwards for rehab. -Per ortho.  HTN -Continue home meds.  Depression/Anxiety -Continue psychotropic meds.  Code Status: Full  Family Communication: Daughter at bedside updated on plan of care.  Disposition Plan: SNF   Consultants:  Ortho   Antibiotics:  none   Subjective: No complaints, comfortable  Objective: Filed Vitals:   06/06/13 1011 06/06/13 1200 06/06/13 1337 06/06/13 1650  BP: 126/56  103/52 114/44  Pulse: 72  73 74  Temp: 98.9 F (37.2 C)  98.5 F (36.9 C) 98.5 F (36.9 C)  TempSrc: Oral  Oral Oral  Resp: 18 16 18 16   Height:      Weight:      SpO2: 100% 100% 100% 99%    Intake/Output Summary (Last 24 hours) at 06/06/13 1728 Last data filed at 06/06/13 1451  Gross per 24 hour  Intake   2205 ml  Output   1400 ml  Net    805 ml   Filed Weights   06/04/13 1529  Weight: 45.36 kg (100 lb)    Exam:   General:  AA Ox3  Cardiovascular: RRR  Respiratory: CTA B  Abdomen: S/NT/ND/+BS  Extremities: no C/C/E   Neurologic:  Non-focal  Data Reviewed: Basic Metabolic Panel:  Recent Labs Lab 06/04/13 1320 06/05/13 0500 06/06/13 0505  NA 127* 131* 131*  K 4.4 5.2 3.9  CL 90* 95* 96  CO2 23 24 24   GLUCOSE 133* 100* 152*  BUN 17 13 14   CREATININE 0.71 0.75 0.78  CALCIUM 8.5 8.2* 7.6*   Liver Function Tests: No results found for this basename: AST, ALT, ALKPHOS, BILITOT, PROT, ALBUMIN,  in the last 168 hours No results found for this basename: LIPASE, AMYLASE,  in the last 168 hours No results found for this basename: AMMONIA,  in the last 168 hours CBC:  Recent Labs Lab 06/04/13 1320 06/05/13 0500 06/06/13 0505  WBC 6.5 8.9 9.4  NEUTROABS 4.8  --   --   HGB 11.6* 11.9* 9.4*  HCT 33.8* 35.0* 28.3*  MCV 92.9 94.1 95.9  PLT 267  253 218   Cardiac Enzymes: No results found for this basename: CKTOTAL, CKMB, CKMBINDEX, TROPONINI,  in the last 168 hours BNP (last 3 results) No results found for this basename: PROBNP,  in the last 8760 hours CBG: No results found for this basename: GLUCAP,  in the last 168 hours  No results found for this or any previous visit (from the past 240 hour(s)).   Studies: Dg Hip Operative Right  06/05/2013   CLINICAL DATA:  Status post ORIF for an intertrochanteric fracture of the right femur.  EXAM: DG OPERATIVE RIGHT HIP  TECHNIQUE: Three spot fluoroscopic films are submitted.  COMPARISON:  Preoperative study of June 04, 2013  FINDINGS: The patient has undergone placement of a telescoping screw and intra medullary rod for fixation of the intertrochanteric fracture. Alignment of fracture fragments is now near anatomic.  IMPRESSION: The patient has undergone ORIF foreign intertrochanteric fracture. There is no immediate postprocedure complication.   Electronically Signed   By: David  SwazilandJordan   On: 06/05/2013 21:09    Scheduled Meds: . amLODipine  2.5 mg Oral Daily  . citalopram  20 mg Oral Daily  . docusate sodium  100 mg Oral BID  . enoxaparin (LOVENOX) injection  40 mg Subcutaneous Q24H  .  galantamine  4 mg Oral BID WC  . multivitamin with minerals  1 tablet Oral Daily  . psyllium  1 packet Oral QID  . ziprasidone  80 mg Oral Daily   Continuous Infusions: . sodium chloride 75 mL/hr at 06/05/13 2124    Principal Problem:   Hip fracture Active Problems:   Depression   Anxiety state, unspecified   HTN (hypertension)   Closed right hip fracture    Time spent: 25 minutes. Greater than 50% of this time was spent in direct contact with the patient coordinating care.    Henderson Cloud  [facility] Pager [phone]  If 7PM-7AM, please contact night-coverage at [email or web address], password [facility] 06/06/2013, 5:28 PM  LOS: 2 days

## 2013-06-06 NOTE — Progress Notes (Addendum)
Yolanda Baker  MRN: [id number] DOB/Age: [birth year] 78 y.o. Physician: Yolanda Baker Procedure: Procedure(s) (LRB): INTRAMEDULLARY (IM) NAIL INTERTROCHANTRIC (Right)     Subjective: Awake and alert. No complaints other than family concerned she hasnt had BM since Thursday. Tolerated breakfast  Vital Signs Temp:  [97.6 F (36.4 C)-99.5 F (37.5 C)] 98.2 F (36.8 C) (04/11 0523) Pulse Rate:  [60-71] 62 (04/11 0523) Resp:  [16-18] 16 (04/11 0800) BP: (108-148)/(53-65) 119/62 mmHg (04/11 0523) SpO2:  [95 %-100 %] 96 % (04/11 0800)  Lab Results  Recent Labs  06/05/13 0500 06/06/13 0505  WBC 8.9 9.4  HGB 11.9* 9.4*  HCT 35.0* 28.3*  PLT 253 218   BMET  Recent Labs  06/05/13 0500 06/06/13 0505  NA 131* 131*  K 5.2 3.9  CL 95* 96  CO2 24 24  GLUCOSE 100* 152*  BUN 13 14  CREATININE 0.75 0.78  CALCIUM 8.2* 7.6*   INR  Date Value Ref Range Status  06/04/2013 1.08  0.00 - 1.49 Final     Exam Right hip dressings dry. Has been reinforced with a ACE wrap  NVI       Plan Orders reviewed and she is on colace and metamucil Begin OOB with PT today Continue care per medicine team  Yolanda Baker for Dr.Kevin Baker 06/06/2013, 9:10 AM

## 2013-06-07 ENCOUNTER — Other Ambulatory Visit: Payer: Self-pay

## 2013-06-07 DIAGNOSIS — I4892 Unspecified atrial flutter: Secondary | ICD-10-CM

## 2013-06-07 LAB — BASIC METABOLIC PANEL
BUN: 18 mg/dL (ref 6–23)
CO2: 24 mEq/L (ref 19–32)
Calcium: 7.6 mg/dL — ABNORMAL LOW (ref 8.4–10.5)
Chloride: 97 mEq/L (ref 96–112)
Creatinine, Ser: 0.7 mg/dL (ref 0.50–1.10)
GFR calc Af Amer: 87 mL/min — ABNORMAL LOW (ref >90)
GFR calc non Af Amer: 75 mL/min — ABNORMAL LOW (ref >90)
Glucose, Bld: 125 mg/dL — ABNORMAL HIGH (ref 70–99)
Potassium: 3.7 mEq/L (ref 3.7–5.3)
Sodium: 132 mEq/L — ABNORMAL LOW (ref 137–147)

## 2013-06-07 LAB — CBC
HCT: 24.5 % — ABNORMAL LOW (ref 36.0–46.0)
Hemoglobin: 8.3 g/dL — ABNORMAL LOW (ref 12.0–15.0)
MCH: 32.4 pg (ref 26.0–34.0)
MCHC: 33.9 g/dL (ref 30.0–36.0)
MCV: 95.7 fL (ref 78.0–100.0)
Platelets: 224 10*3/uL (ref 150–400)
RBC: 2.56 MIL/uL — ABNORMAL LOW (ref 3.87–5.11)
RDW: 15.5 % (ref 11.5–15.5)
WBC: 11.4 10*3/uL — ABNORMAL HIGH (ref 4.0–10.5)

## 2013-06-07 MED ORDER — METHOCARBAMOL 500 MG PO TABS: 500.0000 mg | ORAL_TABLET | Freq: Four times a day (QID) | ORAL | Status: DC | PRN

## 2013-06-07 MED ORDER — METOPROLOL TARTRATE 25 MG PO TABS
25.0000 mg | ORAL_TABLET | Freq: Two times a day (BID) | ORAL | Status: DC
  Administered 2013-06-07 – 2013-06-08 (×3): 25 mg via ORAL
  Filled 2013-06-07 (×4): qty 1

## 2013-06-07 MED ORDER — OXYCODONE HCL 5 MG PO TABS: 5.0000 mg | ORAL_TABLET | ORAL | Status: DC | PRN

## 2013-06-07 MED ORDER — ENOXAPARIN SODIUM 40 MG/0.4ML ~~LOC~~ SOLN: 40.0000 mg | SUBCUTANEOUS | Status: DC

## 2013-06-07 NOTE — Progress Notes (Signed)
Subjective: 2 Days Post-Op Procedure(s) (LRB): INTRAMEDULLARY (IM) NAIL INTERTROCHANTRIC (Right) Patient reports pain as 2 on 0-10 scale. Stable this morning and no significant post-op problems.Hbg 8.3   Objective: Vital signs in last 24 hours: Temp:  [97.6 F (36.4 C)-98.9 F (37.2 C)] 98 F (36.7 C) (04/12 0453) Pulse Rate:  [72-80] 80 (04/12 0453) Resp:  [15-18] 16 (04/12 0453) BP: (103-126)/(44-72) 103/67 mmHg (04/12 0453) SpO2:  [96 %-100 %] 98 % (04/12 0453)  Intake/Output from previous day: 04/11 0701 - 04/12 0700 In: 890 [P.O.:750; I.V.:140] Out: 1575 [Urine:1575] Intake/Output this shift:     Recent Labs  06/04/13 1320 06/05/13 0500 06/06/13 0505 06/07/13 0512  HGB 11.6* 11.9* 9.4* 8.3*    Recent Labs  06/06/13 0505 06/07/13 0512  WBC 9.4 11.4*  RBC 2.95* 2.56*  HCT 28.3* 24.5*  PLT 218 224    Recent Labs  06/06/13 0505 06/07/13 0512  NA 131* 132*  K 3.9 3.7  CL 96 97  CO2 24 24  BUN 14 18  CREATININE 0.78 0.70  GLUCOSE 152* 125*  CALCIUM 7.6* 7.6*    Recent Labs  06/04/13 1320  INR 1.08    Dorsiflexion/Plantar flexion intact  Assessment/Plan: 2 Days Post-Op Procedure(s) (LRB): INTRAMEDULLARY (IM) NAIL INTERTROCHANTRIC (Right) Up with therapy Plan  to transfer to SNF  Jacki Cones 06/07/2013, 7:48 AM

## 2013-06-07 NOTE — Discharge Instructions (Signed)
Weightbearing as tolerated Change dressing daily Shower ok. No tub baths Take Lovenox for 10 days post op including hospital stay then transition to aspirin 325mg  daily for DVT prophylaxis.

## 2013-06-07 NOTE — Progress Notes (Signed)
ekg done, results texted to Dr. Philip Aspen. M.[employer]

## 2013-06-07 NOTE — Progress Notes (Addendum)
[facility] PROGRESS NOTE  Yolanda Baker NOB:[id number] DOB: [birth year] DOA: 06/04/2013 PCP: Ezequiel Kayser, MD  Assessment/Plan: Right Hip Fracture -For repair 14-Jun-2022. -Will need SNF afterwards for rehab. -Per ortho.  New Onset A Flutter -Not uncommon to see after surgery. -Will start on lopressor 25 mg BID for rate control. -Will DC norvasc given low-normal BP. -As discussed with daughter, not a candidate for long-term anticoagulation given her age and frequent falls.  HTN -Continue home meds.  Depression/Anxiety -Continue psychotropic meds.  Code Status: Full  Family Communication: Daughter at bedside updated on plan of care.  Disposition Plan: SNF   Consultants:  Ortho   Antibiotics:  none   Subjective: No complaints, comfortable  Objective: Filed Vitals:   06/06/13 1650 06/06/13 2141 06/07/13 0453 06/07/13 1339  BP: 114/44 120/72 103/67 83/48  Pulse: 74 75 80 101  Temp: 98.5 F (36.9 C) 97.6 F (36.4 C) 98 F (36.7 C) 98.5 F (36.9 C)  TempSrc: Oral Oral Oral Oral  Resp: 16 15 16 16   Height:      Weight:      SpO2: 99% 100% 98% 96%    Intake/Output Summary (Last 24 hours) at 06/07/13 1639 Last data filed at 06/07/13 1434  Gross per 24 hour  Intake 1616.25 ml  Output   1275 ml  Net 341.25 ml   Filed Weights   06/04/13 1529  Weight: 45.36 kg (100 lb)    Exam:   General:  AA Ox3  Cardiovascular: RRR  Respiratory: CTA B  Abdomen: S/NT/ND/+BS  Extremities: no C/C/E   Neurologic:  Non-focal  Data Reviewed: Basic Metabolic Panel:  Recent Labs Lab 06/04/13 1320 06/13/13 0500 06/06/13 0505 06/07/13 0512  NA 127* 131* 131* 132*  K 4.4 5.2 3.9 3.7  CL 90* 95* 96 97  CO2 23 24 24 24   GLUCOSE 133* 100* 152* 125*  BUN 17 13 14 18   CREATININE 0.71 0.75 0.78 0.70  CALCIUM 8.5 8.2* 7.6* 7.6*   Liver Function Tests: No results found for this basename: AST, ALT, ALKPHOS, BILITOT, PROT, ALBUMIN,  in the last 168 hours No  results found for this basename: LIPASE, AMYLASE,  in the last 168 hours No results found for this basename: AMMONIA,  in the last 168 hours CBC:  Recent Labs Lab 06/04/13 1320 06-13-2013 0500 06/06/13 0505 06/07/13 0512  WBC 6.5 8.9 9.4 11.4*  NEUTROABS 4.8  --   --   --   HGB 11.6* 11.9* 9.4* 8.3*  HCT 33.8* 35.0* 28.3* 24.5*  MCV 92.9 94.1 95.9 95.7  PLT 267 253 218 224   Cardiac Enzymes: No results found for this basename: CKTOTAL, CKMB, CKMBINDEX, TROPONINI,  in the last 168 hours BNP (last 3 results) No results found for this basename: PROBNP,  in the last 8760 hours CBG: No results found for this basename: GLUCAP,  in the last 168 hours  No results found for this or any previous visit (from the past 240 hour(s)).   Studies: Dg Hip Operative Right  June 13, 2013   CLINICAL DATA:  Status post ORIF for an intertrochanteric fracture of the right femur.  EXAM: DG OPERATIVE RIGHT HIP  TECHNIQUE: Three spot fluoroscopic films are submitted.  COMPARISON:  Preoperative study of June 04, 2013  FINDINGS: The patient has undergone placement of a telescoping screw and intra medullary rod for fixation of the intertrochanteric fracture. Alignment of fracture fragments is now near anatomic.  IMPRESSION: The patient has undergone ORIF foreign intertrochanteric  fracture. There is no immediate postprocedure complication.   Electronically Signed   By: David  [place]   On: 06/05/2013 21:09    Scheduled Meds: . citalopram  20 mg Oral Daily  . docusate sodium  100 mg Oral BID  . enoxaparin (LOVENOX) injection  40 mg Subcutaneous Q24H  . galantamine  4 mg Oral BID WC  . metoprolol tartrate  25 mg Oral BID  . multivitamin with minerals  1 tablet Oral Daily  . psyllium  1 packet Oral QID  . ziprasidone  80 mg Oral Daily   Continuous Infusions: . sodium chloride 75 mL/hr at 06/05/13 2124    Principal Problem:   Hip fracture Active Problems:   Depression   Anxiety state, unspecified   HTN  (hypertension)   Closed right hip fracture   Atrial flutter    Time spent: 25 minutes. Greater than 50% of this time was spent in direct contact with the patient coordinating care.    Yolanda Baker  [facility] Pager [phone]  If 7PM-7AM, please contact night-coverage at [email or web address], password [email or web address] 06/07/2013, 4:39 PM  LOS: 3 days

## 2013-06-08 ENCOUNTER — Encounter: Payer: Self-pay | Admitting: *Deleted

## 2013-06-08 LAB — CBC
HCT: 24 % — ABNORMAL LOW (ref 36.0–46.0)
Hemoglobin: 8.2 g/dL — ABNORMAL LOW (ref 12.0–15.0)
MCH: 32.8 pg (ref 26.0–34.0)
MCHC: 34.2 g/dL (ref 30.0–36.0)
MCV: 96 fL (ref 78.0–100.0)
Platelets: 265 10*3/uL (ref 150–400)
RBC: 2.5 MIL/uL — ABNORMAL LOW (ref 3.87–5.11)
RDW: 15.6 % — ABNORMAL HIGH (ref 11.5–15.5)
WBC: 12.1 10*3/uL — ABNORMAL HIGH (ref 4.0–10.5)

## 2013-06-08 MED ORDER — METOPROLOL TARTRATE 25 MG PO TABS: 25.0000 mg | ORAL_TABLET | Freq: Two times a day (BID) | ORAL | Status: DC

## 2013-06-08 NOTE — Progress Notes (Signed)
Clinical Social Work Department CLINICAL SOCIAL WORK PLACEMENT NOTE 06/08/2013  Patient:  Yolanda Baker, Yolanda Baker  Account Number:  [id number] Admit date:  06/04/2013  Clinical Social Worker:  Cori Razor, LCSW  Date/time:  06/05/2013 10:13 AM  Clinical Social Work is seeking post-discharge placement for this patient at the following level of care:   SKILLED NURSING   (*CSW will update this form in Epic as items are completed)   06/05/2013  Patient/family provided with [facility] Department of Clinical Social Work's list of facilities offering this level of care within the geographic area requested by the patient (or if unable, by the patient's family).  06/05/2013  Patient/family informed of their freedom to choose among providers that offer the needed level of care, that participate in Medicare, Medicaid or managed care program needed by the patient, have an available bed and are willing to accept the patient.    Patient/family informed of [facility]' ownership interest in [facility], as well as of the fact that they are under no obligation to receive care at this facility.  PASARR submitted to EDS on 06/05/2013 PASARR number received from EDS on 06/05/2013  FL2 transmitted to all facilities in geographic area requested by pt/family on  06/05/2013 FL2 transmitted to all facilities within larger geographic area on   Patient informed that his/her managed care company has contracts with or will negotiate with  certain facilities, including the following:     Patient/family informed of bed offers received:  06/05/2013 Patient chooses bed at [facility] Physician recommends and patient chooses bed at    Patient to be transferred to [facility] on  06/08/2013 Patient to be transferred to facility by [facility]  The following physician request were entered in Epic:   Additional Comments:  Cori Razor LCSW [phone]

## 2013-06-08 NOTE — Progress Notes (Signed)
   Subjective: 3 Days Post-Op Procedure(s) (LRB): INTRAMEDULLARY (IM) NAIL INTERTROCHANTRIC (Right) Patient reports pain as mild.   Patient seen in rounds with Dr. Lequita Halt.  Patient is pleasantly confused this morning. Patient is well, and has had no acute complaints or problems Patient is ready to go to SNF when medically ready.  Objective: Vital signs in last 24 hours: Temp:  [98.3 F (36.8 C)-98.9 F (37.2 C)] 98.9 F (37.2 C) (04/13 0459) Pulse Rate:  [88-102] 88 (04/13 0459) Resp:  [14-16] 16 (04/13 0459) BP: (83-137)/(48-68) 137/68 mmHg (04/13 0459) SpO2:  [96 %-97 %] 96 % (04/13 0459)  Intake/Output from previous day:  Intake/Output Summary (Last 24 hours) at 06/08/13 0726 Last data filed at 06/08/13 2595  Gross per 24 hour  Intake 2044.75 ml  Output   1150 ml  Net 894.75 ml    Intake/Output this shift:    Labs:  Recent Labs  06/06/13 0505 06/07/13 0512 06/08/13 0359  HGB 9.4* 8.3* 8.2*    Recent Labs  06/07/13 0512 06/08/13 0359  WBC 11.4* 12.1*  RBC 2.56* 2.50*  HCT 24.5* 24.0*  PLT 224 265    Recent Labs  06/06/13 0505 06/07/13 0512  NA 131* 132*  K 3.9 3.7  CL 96 97  CO2 24 24  BUN 14 18  CREATININE 0.78 0.70  GLUCOSE 152* 125*  CALCIUM 7.6* 7.6*   No results found for this basename: LABPT, INR,  in the last 72 hours  EXAM: General - Patient is Alert, Appropriate and Confused Extremity - Neurovascular intact Sensation intact distally Incision - clean, dry, no drainage Motor Function - intact, moving foot and toes well on exam.   Assessment/Plan: 3 Days Post-Op Procedure(s) (LRB): INTRAMEDULLARY (IM) NAIL INTERTROCHANTRIC (Right) Procedure(s) (LRB): INTRAMEDULLARY (IM) NAIL INTERTROCHANTRIC (Right) Past Medical History  Diagnosis Date  . Osteoporosis   . Hypertension   . Arthritis     neck  . Depression    Principal Problem:   Hip fracture Active Problems:   Depression   Anxiety state, unspecified   HTN  (hypertension)   Closed right hip fracture   Atrial flutter  Estimated body mass index is 18.29 kg/(m^2) as calculated from the following:   Height as of this encounter: 5\' 2"  (1.575 m).   Weight as of this encounter: 45.36 kg (100 lb). Up with therapy Discharge to SNF when medically ready. Diet - Cardiac diet Follow up - in 2 weeks Activity - WBAT Disposition - Skilled nursing facility Condition Upon Discharge - Stable D/C Meds - See DC Summary DVT Prophylaxis - Lovenox for a total of ten days and then ASA 325 mg.  Alexzandrew Perkins 06/08/2013, 7:26 AM

## 2013-06-08 NOTE — Progress Notes (Signed)
CSW assisting with d/c planning. Pt has rehab bed at [facility] today if stable for d/c. CSW will assist with d/c planning to SNF.  Cori Razor LCSW [phone]

## 2013-06-08 NOTE — Discharge Summary (Addendum)
Physician Discharge Summary  Yolanda Baker ZOX:[id number] DOB: 07/16/1923 DOA: 06/04/2013  PCP: Ezequiel Kayser, MD  Admit date: 06/04/2013 Discharge date: 06/08/2013  Time spent: 45 minutes  Recommendations for Outpatient Follow-up:  -Will be discharged to SNF today for post-hip fracture rehab.   Discharge Diagnoses:  Principal Problem:   Hip fracture Active Problems:   Depression   Anxiety state, unspecified   HTN (hypertension)   Closed right hip fracture   Atrial flutter   Discharge Condition: Stable and improved  Filed Weights   06/04/13 1529  Weight: 45.36 kg (100 lb)    History of present illness:  Patient is an 80 year old woman with past medical history significant for hypertension, osteoporosis, depression status post left hip hemiarthroplasty in 2008 by Dr. Despina Hick, who fell today while at her facility. This is a mechanical fall, she states she never lost consciousness, she tripped over her cane. Daughter was present and witnessed the fall. She also suffered a right elbow skin tear during her fall. She was brought to the hospital with an obvious deformity to her right hip. Orthopedics, Dr. Darrelyn Hillock, has seen the patient and is planning on operative repair tomorrow. Hospitalist admission was requested.   Hospital Course:   Right Hip Fracture  -S/p repair 4/10.  -Will need SNF afterwards for rehab.  -Per ortho.  -Lovenox for 10 days then ASA for DVT prophylaxis.  ABLA -Likely from surgical losses. -No need for transfusion at present.  New Onset A Flutter  -Not uncommon to see after surgery.  -Will start on lopressor 25 mg BID for rate control. HR around 88-100 on DC. -Will DC norvasc/lisinopril given low-normal BP.  -As discussed with daughter, not a candidate for long-term anticoagulation given her age and frequent falls.   HTN  -Continue home meds.   Depression/Anxiety/Dementia -Continue psychotropic meds.   Procedures:  Right Hip repair 4/10    Consultations:  Ortho  Discharge Instructions  Discharge Orders   Future Orders Complete By Expires   Diet - low sodium heart healthy  As directed    Discontinue IV  As directed    Increase activity slowly  As directed    Weight bearing as tolerated  As directed    Questions:     Laterality:     Extremity:         Medication List    STOP taking these medications       amLODipine 5 MG tablet  Commonly known as:  NORVASC     aspirin EC 81 MG tablet     lisinopril 20 MG tablet  Commonly known as:  PRINIVIL,ZESTRIL     potassium chloride SA 20 MEQ tablet  Commonly known as:  K-DUR,KLOR-CON      TAKE these medications       acetaminophen 500 MG chewable tablet  Commonly known as:  TYLENOL  Chew 1,000 mg by mouth 2 (two) times daily.     bisacodyl 5 MG EC tablet  Commonly known as:  DULCOLAX  Take 5 mg by mouth daily as needed for moderate constipation.     calcium carbonate 600 MG Tabs tablet  Commonly known as:  OS-CAL  Take 600 mg by mouth daily.     citalopram 40 MG tablet  Commonly known as:  CELEXA  Take 40 mg by mouth daily.     enoxaparin 40 MG/0.4ML injection  Commonly known as:  LOVENOX  Inject 0.4 mLs (40 mg total) into the skin daily.  galantamine 8 MG 24 hr capsule  Commonly known as:  RAZADYNE ER  Take 8 mg by mouth daily with breakfast.     LORAZEPAM PO  Take 0.5 mg by mouth at bedtime as needed. Takes an extra dose during the day if needed     methocarbamol 500 MG tablet  Commonly known as:  ROBAXIN  Take 1 tablet (500 mg total) by mouth every 6 (six) hours as needed for muscle spasms.     metoprolol tartrate 25 MG tablet  Commonly known as:  LOPRESSOR  Take 1 tablet (25 mg total) by mouth 2 (two) times daily.     multivitamin tablet  Take 1 tablet by mouth daily.     oxyCODONE 5 MG immediate release tablet  Commonly known as:  Oxy IR/ROXICODONE  Take 1-2 tablets (5-10 mg total) by mouth every 4 (four) hours as needed for  breakthrough pain ((for MODERATE breakthrough pain)).     psyllium 58.6 % powder  Commonly known as:  METAMUCIL  Take 1 packet by mouth 4 (four) times daily. Pt takes 1 tablespoon at each dose     RECLAST 5 MG/100ML Soln injection  Generic drug:  zoledronic acid  Inject 5 mg into the vein once. Yearly by IV, for osteoporosis     vitamin E 400 UNIT capsule  Take 400 Units by mouth daily.     ziprasidone 80 MG capsule  Commonly known as:  GEODON  Take 80 mg by mouth daily.       Allergies  Allergen Reactions  . Flagyl [Metronidazole]     Fingers get "numb"       Follow-up Information   Follow up with Loanne Drilling, MD. Schedule an appointment as soon as possible for a visit in 2 weeks.   Specialty:  Orthopedic Surgery   Contact information:   [address] [place] [address] [phone]        The results of significant diagnostics from this hospitalization (including imaging, microbiology, ancillary and laboratory) are listed below for reference.    Significant Diagnostic Studies: Dg Chest 1 View  06/04/2013   CLINICAL DATA:  Hip fracture  EXAM: CHEST - 1 VIEW  COMPARISON:  09/08/2006  FINDINGS: Cardiomediastinal silhouette is stable. No acute infiltrate or pulmonary edema. Mild hyperinflation. Stable chronic mild interstitial prominence. Central mild bronchitic changes.  IMPRESSION: No acute infiltrate or pulmonary edema. Mild hyperinflation. Stable chronic mild interstitial prominence.   Electronically Signed   By: Natasha Mead M.D.   On: 06/04/2013 14:08   Dg Hip Complete Right  06/04/2013   CLINICAL DATA:  Right hip pain post injury/fall  EXAM: RIGHT HIP - COMPLETE 2+ VIEW  COMPARISON:  None.  FINDINGS: Three views of the right hip submitted. There is displaced intertrochanteric fracture of proximal right femur. There is also medial displaced fracture fragment of lesser femoral trochanter.  IMPRESSION: Displaced intertrochanteric fracture of proximal  right femur.   Electronically Signed   By: Natasha Mead M.D.   On: 06/04/2013 14:07   Dg Hip Operative Right  06/05/2013   CLINICAL DATA:  Status post ORIF for an intertrochanteric fracture of the right femur.  EXAM: DG OPERATIVE RIGHT HIP  TECHNIQUE: Three spot fluoroscopic films are submitted.  COMPARISON:  Preoperative study of June 04, 2013  FINDINGS: The patient has undergone placement of a telescoping screw and intra medullary rod for fixation of the intertrochanteric fracture. Alignment of fracture fragments is now near anatomic.  IMPRESSION: The  patient has undergone ORIF foreign intertrochanteric fracture. There is no immediate postprocedure complication.   Electronically Signed   By: David  [place]azil[place]   On: 06/05/2013 21:09    Microbiology: No results found for this or any previous visit (from the past 240 hour(s)).   Labs: Basic Metabolic Panel:  Recent Labs Lab 06/04/13 1320 06/05/13 0500 06/06/13 0505 06/07/13 0512  NA 127* 131* 131* 132*  K 4.4 5.2 3.9 3.7  CL 90* 95* 96 97  CO2 23 24 24 24   GLUCOSE 133* 100* 152* 125*  BUN 17 13 14 18   CREATININE 0.71 0.75 0.78 0.70  CALCIUM 8.5 8.2* 7.6* 7.6*   Liver Function Tests: No results found for this basename: AST, ALT, ALKPHOS, BILITOT, PROT, ALBUMIN,  in the last 168 hours No results found for this basename: LIPASE, AMYLASE,  in the last 168 hours No results found for this basename: AMMONIA,  in the last 168 hours CBC:  Recent Labs Lab 06/04/13 1320 06/05/13 0500 06/06/13 0505 06/07/13 0512 06/08/13 0359  WBC 6.5 8.9 9.4 11.4* 12.1*  NEUTROABS 4.8  --   --   --   --   HGB 11.6* 11.9* 9.4* 8.3* 8.2*  HCT 33.8* 35.0* 28.3* 24.5* 24.0*  MCV 92.9 94.1 95.9 95.7 96.0  PLT 267 253 218 224 265   Cardiac Enzymes: No results found for this basename: CKTOTAL, CKMB, CKMBINDEX, TROPONINI,  in the last 168 hours BNP: BNP (last 3 results) No results found for this basename: PROBNP,  in the last 8760 hours CBG: No results  found for this basename: GLUCAP,  in the last 168 hours     Signed:  Henderson CloudEstela Y Hernandez Acosta  [facility] Pager: [phone] 06/08/2013, 9:50 AM

## 2013-06-09 ENCOUNTER — Non-Acute Institutional Stay (SKILLED_NURSING_FACILITY): Payer: Medicare Other | Admitting: Adult Health

## 2013-06-09 ENCOUNTER — Encounter: Payer: Self-pay | Admitting: Adult Health

## 2013-06-09 DIAGNOSIS — F419 Anxiety disorder, unspecified: Secondary | ICD-10-CM | POA: Insufficient documentation

## 2013-06-09 DIAGNOSIS — I4892 Unspecified atrial flutter: Secondary | ICD-10-CM

## 2013-06-09 DIAGNOSIS — F3289 Other specified depressive episodes: Secondary | ICD-10-CM

## 2013-06-09 DIAGNOSIS — F329 Major depressive disorder, single episode, unspecified: Secondary | ICD-10-CM

## 2013-06-09 DIAGNOSIS — F411 Generalized anxiety disorder: Secondary | ICD-10-CM

## 2013-06-09 DIAGNOSIS — I10 Essential (primary) hypertension: Secondary | ICD-10-CM

## 2013-06-09 DIAGNOSIS — S72009A Fracture of unspecified part of neck of unspecified femur, initial encounter for closed fracture: Secondary | ICD-10-CM

## 2013-06-09 DIAGNOSIS — F0390 Unspecified dementia without behavioral disturbance: Secondary | ICD-10-CM

## 2013-06-09 DIAGNOSIS — F32A Depression, unspecified: Secondary | ICD-10-CM

## 2013-06-09 NOTE — Progress Notes (Signed)
Patient ID: Yolanda Baker, female   DOB: 21-Jan-1924, 80 y.o.   MRN: [id number]               PROGRESS NOTE  DATE: 06/09/2013  FACILITY: Nursing Home Location: [facility]  LEVEL OF CARE: SNF (31)  Acute Visit  CHIEF COMPLAINT:  Follow-up Hospitalization  HISTORY OF PRESENT ILLNESS: This is an 80 year old female who has been admitted to [facility] on 06/08/13 from [facility]. She fell in a facility where she lives and sustained a right hip fracture S/P repair. She has been admitted for a short-term rehabilitation.  REASSESSMENT OF ONGOING PROBLEM(S):  HTN: Pt 's HTN remains stable.  Denies CP, sob, DOE, pedal edema, headaches, dizziness or visual disturbances.  No complications from the medications currently being used.  Last BP : 101/57  ATRIAL FIBRILLATION: the patients atrial fibrillation remains stable.  The patient denies DOE, tachycardia, orthopnea, transient neurological sx, pedal edema, palpitations, & PNDs.  No complications noted from the medications currently being used.  DEMENTIA: The dementia remaines stable and continues to function adequately in the current living environment with supervision.  The patient has had little changes in behavior. No complications noted from the medications presently being used.  PAST MEDICAL HISTORY : Reviewed.  No changes.  CURRENT MEDICATIONS: Reviewed per [facility]  REVIEW OF SYSTEMS:  GENERAL: no change in appetite, no fatigue, no weight changes, no fever, chills or weakness RESPIRATORY: no cough, SOB, DOE, wheezing, hemoptysis CARDIAC: no chest pain, edema or palpitations GI: no abdominal pain, diarrhea, constipation, heart burn, nausea or vomiting  PHYSICAL EXAMINATION  GENERAL: no acute distress, normal body habitus EYES: conjunctivae normal, sclerae normal, normal eye lids NECK: supple, trachea midline, no neck masses, no thyroid tenderness, no thyromegaly LYMPHATICS: no LAN in the neck, no  supraclavicular LAN RESPIRATORY: breathing is even & unlabored, BS CTAB CARDIAC: irregular heart, no murmur,no extra heart sounds, no edema GI: abdomen soft, normal BS, no masses, no tenderness, no hepatomegaly, no splenomegaly PSYCHIATRIC: the patient is alert & oriented to person, affect & behavior appropriate  LABS/RADIOLOGY: Labs reviewed: Basic Metabolic Panel:  Recent Labs  53/64/68 0500 06/06/13 0505 06/07/13 0512  NA 131* 131* 132*  K 5.2 3.9 3.7  CL 95* 96 97  CO2 24 24 24   GLUCOSE 100* 152* 125*  BUN 13 14 18   CREATININE 0.75 0.78 0.70  CALCIUM 8.2* 7.6* 7.6*   CBC:  Recent Labs  06/04/13 1320  06/06/13 0505 06/07/13 0512 06/08/13 0359  WBC 6.5  < > 9.4 11.4* 12.1*  NEUTROABS 4.8  --   --   --   --   HGB 11.6*  < > 9.4* 8.3* 8.2*  HCT 33.8*  < > 28.3* 24.5* 24.0*  MCV 92.9  < > 95.9 95.7 96.0  PLT 267  < > 218 224 265  < > = values in this interval not displayed.   ASSESSMENT/PLAN:  Right hip fracture status post repair - for rehabilitation  Hypertension - well controlled; recently discontinued Norvasc and lisinopril due to new diagnosis of atrial flutter - New diagnosis- started on Lopressor; not a candidate for long-term anticoagulant due to advanced age and frequent falls Atrial flutter - rate controlled; continue Lopressor Dementia with agitation - continue Razadyne and Geodon Anxiety - will continue Ativan PRN   CPT CODE: 03212  Ella Bodo - NP [facility] [phone]

## 2013-06-10 ENCOUNTER — Other Ambulatory Visit: Payer: Self-pay | Admitting: *Deleted

## 2013-06-10 MED ORDER — LORAZEPAM 0.5 MG PO TABS: ORAL_TABLET | ORAL | Status: DC

## 2013-06-10 NOTE — Telephone Encounter (Signed)
[facility] 

## 2013-06-23 ENCOUNTER — Non-Acute Institutional Stay (SKILLED_NURSING_FACILITY): Payer: Medicare Other | Admitting: Adult Health

## 2013-06-23 ENCOUNTER — Encounter: Payer: Self-pay | Admitting: Adult Health

## 2013-06-23 DIAGNOSIS — N390 Urinary tract infection, site not specified: Secondary | ICD-10-CM | POA: Insufficient documentation

## 2013-06-23 NOTE — Progress Notes (Signed)
Patient ID: [name], female   DOB: 03/16/1923, 80 y.o.   MRN: [id number]         PROGRESS NOTE  DATE: 06/23/2013  FACILITY:  [facility]  LEVEL OF CARE: SNF (31)  Acute Visit  CHIEF COMPLAINT:  Manage UTI  HISTORY OF PRESENT ILLNESS:This is an 80 year old female who has urine culture showing greater than 100,000 CFU/ml Citrobacter koseri. Patient was noted to have confusion so urine culture was obtained.  PAST MEDICAL HISTORY : Reviewed.  No changes/see problem list  CURRENT MEDICATIONS: Reviewed per MAR/see medication list  REVIEW OF SYSTEMS:  GENERAL: no change in appetite, no fatigue, no weight changes, no fever, chills or weakness RESPIRATORY: no cough, SOB, DOE,, wheezing, hemoptysis CARDIAC: no chest pain, or palpitations + edema GI: no abdominal pain, diarrhea, constipation, heart burn, nausea or vomiting  PHYSICAL EXAMINATION  GENERAL: no acute distress, normal body habitus NECK: supple, trachea midline, no neck masses, no thyroid tenderness, no thyromegaly LYMPHATICS: no LAN in the neck, no supraclavicular LAN RESPIRATORY: breathing is even & unlabored, BS CTAB CARDIAC: irregular, no murmur,no extra heart sounds, RLE edema 2+ GI: abdomen soft, normal BS, no masses, no tenderness, no hepatomegaly, no splenomegaly PSYCHIATRIC: the patient is alert & oriented to person, affect & behavior appropriate  LABS/RADIOLOGY: Labs reviewed: Basic Metabolic Panel:  Recent Labs  27/74/12 0500 06/06/13 0505 06/07/13 0512  NA 131* 131* 132*  K 5.2 3.9 3.7  CL 95* 96 97  CO2 24 24 24   GLUCOSE 100* 152* 125*  BUN 13 14 18   CREATININE 0.75 0.78 0.70  CALCIUM 8.2* 7.6* 7.6*   CBC:  Recent Labs  06/04/13 1320  06/06/13 0505 06/07/13 0512 06/08/13 0359  WBC 6.5  < > 9.4 11.4* 12.1*  NEUTROABS 4.8  --   --   --   --   HGB 11.6*  < > 9.4* 8.3* 8.2*  HCT 33.8*  < > 28.3* 24.5* 24.0*  MCV 92.9  < > 95.9 95.7 96.0  PLT 267  < > 218 224 265  < >  = values in this interval not displayed.   ASSESSMENT/PLAN:  UTI - start Bactrim DS 1 tab PO BID x 7 days   CPT CODE: 87867  Yolanda Baker - NP [facility] [phone]

## 2013-07-10 ENCOUNTER — Non-Acute Institutional Stay (SKILLED_NURSING_FACILITY): Payer: Medicare Other | Admitting: Adult Health

## 2013-07-10 ENCOUNTER — Encounter: Payer: Self-pay | Admitting: Adult Health

## 2013-07-10 DIAGNOSIS — F0390 Unspecified dementia without behavioral disturbance: Secondary | ICD-10-CM

## 2013-07-10 DIAGNOSIS — F419 Anxiety disorder, unspecified: Secondary | ICD-10-CM

## 2013-07-10 DIAGNOSIS — F32A Depression, unspecified: Secondary | ICD-10-CM

## 2013-07-10 DIAGNOSIS — I4892 Unspecified atrial flutter: Secondary | ICD-10-CM

## 2013-07-10 DIAGNOSIS — S72009A Fracture of unspecified part of neck of unspecified femur, initial encounter for closed fracture: Secondary | ICD-10-CM

## 2013-07-10 DIAGNOSIS — F329 Major depressive disorder, single episode, unspecified: Secondary | ICD-10-CM

## 2013-07-10 DIAGNOSIS — F3289 Other specified depressive episodes: Secondary | ICD-10-CM

## 2013-07-10 DIAGNOSIS — F411 Generalized anxiety disorder: Secondary | ICD-10-CM

## 2013-07-10 DIAGNOSIS — I10 Essential (primary) hypertension: Secondary | ICD-10-CM

## 2013-07-10 NOTE — Progress Notes (Signed)
Patient ID: Yolanda Baker, female   DOB: 18-Dec-1923, 80 y.o.   MRN: [id number]                 PROGRESS NOTE  DATE: 07/10/13  FACILITY: Nursing Home [facility]  LEVEL OF CARE: SNF (31)  Acute Visit  CHIEF COMPLAINT:  Discharge Notes  HISTORY OF PRESENT ILLNESS: This is an 80 year old female who is for discharge home with Home health PT, OT, Nursing and CNA. [facility]. She fell in a facility where she lives and sustained a right hip fracture S/P repair. Patient was admitted to this facility for short-term rehabilitation after the patient's recent hospitalization.  Patient has completed SNF rehabilitation and therapy has cleared the patient for discharge.  REASSESSMENT OF ONGOING PROBLEM(S):  HTN: Pt 's HTN is unstable.  Denies CP, sob, DOE, pedal edema, headaches, dizziness or visual disturbances.  No complications from the medications currently being used.   BPs : 132/80, 138/104, 184/86  DEPRESSION: The depression remains stable. Patient denies ongoing feelings of sadness, insomnia, anedhonia or lack of appetite. No complications reported from the medications currently being used. Staff do not report behavioral problems.  DEMENTIA: The dementia remaines stable and continues to function adequately in the current living environment with supervision.  The patient has had little changes in behavior. No complications noted from the medications presently being used.  PAST MEDICAL HISTORY : Reviewed.  No changes.  [facility]  REVIEW OF SYSTEMS:  GENERAL: no change in appetite, no fatigue, no weight changes, no fever, chills or weakness RESPIRATORY: no cough, SOB, DOE, wheezing, hemoptysis CARDIAC: no chest pain, edema or palpitations GI: no abdominal pain, diarrhea, constipation, heart burn, nausea or vomiting  PHYSICAL EXAMINATION  GENERAL: no acute distress, normal body  habitus EYES: conjunctivae normal, sclerae normal, normal eye lids NECK: supple, trachea midline, no neck masses, no thyroid tenderness, no thyromegaly RESPIRATORY: breathing is even & unlabored, BS CTAB CARDIAC: irregular heart, no murmur,no extra heart sounds, no edema GI: abdomen soft, normal BS, no masses, no tenderness, no hepatomegaly, no splenomegaly EXTREMITIES: able to walk with walker PSYCHIATRIC: the patient is alert & oriented to person, affect & behavior appropriate  LABS/RADIOLOGY: 07/01/13 WBC 4.8 hemoglobin 10.8 hematocrit 35.9 06/26/13 right lower extremity duplex venous ultrasound is negative for DVT 4/31/15 WBC 8.3 hemoglobin 8.6 hematocrit 27.9 Labs reviewed: Basic Metabolic Panel:  Recent Labs  40/98/1102/12/11 0500 06/06/13 0505 06/07/13 0512  NA 131* 131* 132*  K 5.2 3.9 3.7  CL 95* 96 97  CO2 24 24 24   GLUCOSE 100* 152* 125*  BUN 13 14 18   CREATININE 0.75 0.78 0.70  CALCIUM 8.2* 7.6* 7.6*   CBC:  Recent Labs  06/04/13 1320  06/06/13 0505 06/07/13 0512 06/08/13 0359  WBC 6.5  < > 9.4 11.4* 12.1*  NEUTROABS 4.8  --   --   --   --   HGB 11.6*  < > 9.4* 8.3* 8.2*  HCT 33.8*  < > 28.3* 24.5* 24.0*  MCV 92.9  < > 95.9 95.7 96.0  PLT 267  < > 218 224 265  < > = values in this interval not displayed.   ASSESSMENT/PLAN:  Right hip fracture status post repair - for Home health PT, OT, Nursing and CNA Hypertension - start Norvasc 5 mg by mouth daily and continue Lopressor atrial flutter - continue Lopressor; not a candidate for  long-term anticoagulant due to advanced age and frequent falls Dementia with agitation - continue Razadyne and Geodon Anxiety - will continue Ativan    I have filled out patient's discharge paperwork and written prescriptions.  Patient will receive home health PT, OT, Nursing and CNA.   Total discharge time: Less than 30 minutes Discharge time involved coordination of the discharge process with Child psychotherapist, nursing staff and  therapy department. Medical justification for home health services verified.  CPT CODE: 16109   Ella Bodo - NP [facility] [phone]

## 2013-08-05 ENCOUNTER — Other Ambulatory Visit: Payer: Self-pay

## 2013-08-05 DIAGNOSIS — Z1231 Encounter for screening mammogram for malignant neoplasm of breast: Secondary | ICD-10-CM

## 2013-09-30 NOTE — Progress Notes (Signed)
Surgery 10-03-2013, pre op 10-02-2013, Need orders put into Epic please Thank you

## 2013-10-01 ENCOUNTER — Other Ambulatory Visit: Payer: Self-pay | Admitting: Orthopedic Surgery

## 2013-10-01 ENCOUNTER — Encounter (HOSPITAL_COMMUNITY): Payer: Self-pay | Admitting: Pharmacy Technician

## 2013-10-01 ENCOUNTER — Other Ambulatory Visit (HOSPITAL_COMMUNITY): Payer: Self-pay | Admitting: Orthopedic Surgery

## 2013-10-01 NOTE — Progress Notes (Signed)
DREW--- posted as right hip, consent for right hip---X RAY OF HIP ORDER STATES LEFT--- PLEASE CLARIFY  THANKS

## 2013-10-01 NOTE — Progress Notes (Signed)
Chest, ekg 4/15 epic, lov dr Waynard Edwards 09/30/13 on chart

## 2013-10-01 NOTE — Progress Notes (Signed)
Preoperative surgical orders have been place into the Epic hospital system for Yolanda Baker on 10/01/2013, 9:24 AM  by Patrica Duel for surgery on 10/03/2013.  Preop Hip orders including Experel Injecion, IV Tylenol, and IV Decadron as long as there are no contraindications to the above medications. [name], PA-C

## 2013-10-01 NOTE — Patient Instructions (Addendum)
Yolanda DagoEarlene S Baker                  Yolanda Baker  10/02/2013                           YOUR PROCEDURE IS SCHEDULED ON: 10/03/13               ENTER THRU [facility] ENTR[facility] STAY IN LOBBY              NURSE WILL COME AND GET YOU AT 6:00 AM                                               CALL THIS NUMBER IF ANY PROBLEMS THE DAY OF SURGERY :               [phone]                                REMEMBER:   Do not eat food or drink liquids AFTER MIDNIGHT                  Take these medicines the morning of surgery with               A SIPS OF WATER :    AMLODIPINE / CITALOPRAM / LOPRESSOR / GALANTAMINE / MAY TAKE LORAZAPAM AND TRAMADOL IF NEEDED / MAY USE EYE DROPS      Do not wear jewelry, make-up   Do not wear lotions, powders, or perfumes.   Do not shave legs or underarms 12 hrs. before surgery (men may shave face)  Do not bring valuables to the hospital.  Contacts, dentures or bridgework may not be worn into surgery.  Leave suitcase in the car. After surgery it may be brought to your room.  For patients admitted to the hospital more than one night, checkout time is            11:00 AM                                                        ________________________________________________________________________                                                                        [place] - PREPARING FOR SURGERY  Before surgery, you can play an important role.  Because skin is not sterile, your skin needs to be as free of germs as possible.  You can reduce the number of germs on your skin by washing with CHG (chlorahexidine gluconate) soap before surgery.  CHG is an antiseptic cleaner which kills germs and bonds with the skin to continue killing germs even after washing. Please DO NOT use if you have an allergy to CHG or antibacterial soaps.  If your skin becomes  reddened/irritated stop using the CHG and inform your nurse when you arrive at [facility]  Stay. Do not shave (including legs and underarms) for at least 48 hours prior to the first CHG shower.  You may shave your face. Please follow these instructions carefully:   1.  Shower with CHG Soap the night before surgery and the  morning of Surgery.   2.  If you choose to wash your hair, wash your hair first as usual with your  normal  Shampoo.   3.  After you shampoo, rinse your hair and body thoroughly to remove the  shampoo.                                         4.  Use CHG as you would any other liquid soap.  You can apply chg directly  to the skin and wash . Gently wash with scrungie or clean wascloth    5.  Apply the CHG Soap to your body ONLY FROM THE NECK DOWN.   Do not use on open                           Wound or open sores. Avoid contact with eyes, ears mouth and genitals (private parts).                        Genitals (private parts) with your normal soap.              6.  Wash thoroughly, paying special attention to the area where your surgery  will be performed.   7.  Thoroughly rinse your body with warm water from the neck down.   8.  DO NOT shower/wash with your normal soap after using and rinsing off  the CHG Soap .                9.  Pat yourself dry with a clean towel.             10.  Wear clean pajamas.             11.  Place clean sheets on your bed the night of your first shower and do not  sleep with pets.  Day of Surgery : Do not apply any lotions/deodorants the morning of surgery.  Please wear clean clothes to the hospital/surgery center.  FAILURE TO FOLLOW THESE INSTRUCTIONS MAY RESULT IN THE CANCELLATION OF YOUR SURGERY    PATIENT SIGNATURE_________________________________  ______________________________________________________________________     Rogelia Mire  An incentive spirometer is a tool that can help keep your lungs clear and active. This tool measures how well you are filling your lungs with each breath. Taking long  deep breaths may help reverse or decrease the chance of developing breathing (pulmonary) problems (especially infection) following:  A long period of time when you are unable to move or be active. BEFORE THE PROCEDURE   If the spirometer includes an indicator to show your best effort, your nurse or respiratory therapist will set it to a desired goal.  If possible, sit up straight or lean slightly forward. Try not to slouch.  Hold the incentive spirometer in an upright position. INSTRUCTIONS FOR USE  1. Sit on the edge of your bed if possible, or sit up as far as you can in bed or on  a chair. 2. Hold the incentive spirometer in an upright position. 3. Breathe out normally. 4. Place the mouthpiece in your mouth and seal your lips tightly around it. 5. Breathe in slowly and as deeply as possible, raising the piston or the ball toward the top of the column. 6. Hold your breath for 3-5 seconds or for as long as possible. Allow the piston or ball to fall to the bottom of the column. 7. Remove the mouthpiece from your mouth and breathe out normally. 8. Rest for a few seconds and repeat Steps 1 through 7 at least 10 times every 1-2 hours when you are awake. Take your time and take a few normal breaths between deep breaths. 9. The spirometer may include an indicator to show your best effort. Use the indicator as a goal to work toward during each repetition. 10. After each set of 10 deep breaths, practice coughing to be sure your lungs are clear. If you have an incision (the cut made at the time of surgery), support your incision when coughing by placing a pillow or rolled up towels firmly against it. Once you are able to get out of bed, walk around indoors and cough well. You may stop using the incentive spirometer when instructed by your caregiver.  RISKS AND COMPLICATIONS  Take your time so you do not get dizzy or light-headed.  If you are in pain, you may need to take or ask for pain medication  before doing incentive spirometry. It is harder to take a deep breath if you are having pain. AFTER USE  Rest and breathe slowly and easily.  It can be helpful to keep track of a log of your progress. Your caregiver can provide you with a simple table to help with this. If you are using the spirometer at home, follow these instructions: SEEK MEDICAL CARE IF:   You are having difficultly using the spirometer.  You have trouble using the spirometer as often as instructed.  Your pain medication is not giving enough relief while using the spirometer.  You develop fever of 100.5 F (38.1 C) or higher. SEEK IMMEDIATE MEDICAL CARE IF:   You cough up bloody sputum that had not been present before.  You develop fever of 102 F (38.9 C) or greater.  You develop worsening pain at or near the incision site. MAKE SURE YOU:   Understand these instructions.  Will watch your condition.  Will get help right away if you are not doing well or get worse. Document Released: 06/25/2006 Document Revised: 05/07/2011 Document Reviewed: 08/26/2006 [employer] Patient Information 2014 [employer], [place].   ________________________________________________________________________  WHAT IS A BLOOD TRANSFUSION? Blood Transfusion Information  A transfusion is the replacement of blood or some of its parts. Blood is made up of multiple cells which provide different functions.  Red blood cells carry oxygen and are used for blood loss replacement.  White blood cells fight against infection.  Platelets control bleeding.  Plasma helps clot blood.  Other blood products are available for specialized needs, such as hemophilia or other clotting disorders. BEFORE THE TRANSFUSION  Who gives blood for transfusions?   Healthy volunteers who are fully evaluated to make sure their blood is safe. This is blood bank blood. Transfusion therapy is the safest it has ever been in the practice of medicine. Before blood is  taken from a donor, a complete history is taken to make sure that person has no history of diseases nor engages in risky social behavior (  examples are intravenous drug use or sexual activity with multiple partners). The donor's travel history is screened to minimize risk of transmitting infections, such as malaria. The donated blood is tested for signs of infectious diseases, such as HIV and hepatitis. The blood is then tested to be sure it is compatible with you in order to minimize the chance of a transfusion reaction. If you or a relative donates blood, this is often done in anticipation of surgery and is not appropriate for emergency situations. It takes many days to process the donated blood. RISKS AND COMPLICATIONS Although transfusion therapy is very safe and saves many lives, the main dangers of transfusion include:   Getting an infectious disease.  Developing a transfusion reaction. This is an allergic reaction to something in the blood you were given. Every precaution is taken to prevent this. The decision to have a blood transfusion has been considered carefully by your caregiver before blood is given. Blood is not given unless the benefits outweigh the risks. AFTER THE TRANSFUSION  Right after receiving a blood transfusion, you will usually feel much better and more energetic. This is especially true if your red blood cells have gotten low (anemic). The transfusion raises the level of the red blood cells which carry oxygen, and this usually causes an energy increase.  The nurse administering the transfusion will monitor you carefully for complications. HOME CARE INSTRUCTIONS  No special instructions are needed after a transfusion. You may find your energy is better. Speak with your caregiver about any limitations on activity for underlying diseases you may have. SEEK MEDICAL CARE IF:   Your condition is not improving after your transfusion.  You develop redness or irritation at the  intravenous (IV) site. SEEK IMMEDIATE MEDICAL CARE IF:  Any of the following symptoms occur over the next 12 hours:  Shaking chills.  You have a temperature by mouth above 102 F (38.9 C), not controlled by medicine.  Chest, back, or muscle pain.  People around you feel you are not acting correctly or are confused.  Shortness of breath or difficulty breathing.  Dizziness and fainting.  You get a rash or develop hives.  You have a decrease in urine output.  Your urine turns a dark color or changes to pink, red, or brown. Any of the following symptoms occur over the next 10 days:  You have a temperature by mouth above 102 F (38.9 C), not controlled by medicine.  Shortness of breath.  Weakness after normal activity.  The white part of the eye turns yellow (jaundice).  You have a decrease in the amount of urine or are urinating less often.  Your urine turns a dark color or changes to pink, red, or brown. Document Released: 02/10/2000 Document Revised: 05/07/2011 Document Reviewed: 09/29/2007 [facility] Patient Information 2014 [place], [place].  _______________________________________________________________________

## 2013-10-02 ENCOUNTER — Ambulatory Visit (HOSPITAL_COMMUNITY)
Admission: RE | Admit: 2013-10-02 | Discharge: 2013-10-02 | Disposition: A | Discharge status: Home / Self Care | Payer: Medicare Other | Source: Ambulatory Visit | Attending: Orthopedic Surgery | Admitting: Orthopedic Surgery

## 2013-10-02 ENCOUNTER — Encounter (HOSPITAL_COMMUNITY): Payer: Self-pay

## 2013-10-02 ENCOUNTER — Encounter (HOSPITAL_COMMUNITY)
Admission: RE | Admit: 2013-10-02 | Discharge: 2013-10-02 | Disposition: A | Discharge status: Home / Self Care | Payer: Medicare Other | Source: Ambulatory Visit | Attending: Orthopedic Surgery | Admitting: Orthopedic Surgery

## 2013-10-02 DIAGNOSIS — Z881 Allergy status to other antibiotic agents status: Secondary | ICD-10-CM

## 2013-10-02 DIAGNOSIS — F411 Generalized anxiety disorder: Secondary | ICD-10-CM | POA: Diagnosis present

## 2013-10-02 DIAGNOSIS — M810 Age-related osteoporosis without current pathological fracture: Secondary | ICD-10-CM | POA: Diagnosis present

## 2013-10-02 DIAGNOSIS — Z01812 Encounter for preprocedural laboratory examination: Secondary | ICD-10-CM

## 2013-10-02 DIAGNOSIS — F3289 Other specified depressive episodes: Secondary | ICD-10-CM | POA: Diagnosis present

## 2013-10-02 DIAGNOSIS — Z79899 Other long term (current) drug therapy: Secondary | ICD-10-CM

## 2013-10-02 DIAGNOSIS — Z96649 Presence of unspecified artificial hip joint: Secondary | ICD-10-CM

## 2013-10-02 DIAGNOSIS — Y831 Surgical operation with implant of artificial internal device as the cause of abnormal reaction of the patient, or of later complication, without mention of misadventure at the time of the procedure: Secondary | ICD-10-CM | POA: Diagnosis present

## 2013-10-02 DIAGNOSIS — Z681 Body mass index (BMI) 19 or less, adult: Secondary | ICD-10-CM

## 2013-10-02 DIAGNOSIS — S72009A Fracture of unspecified part of neck of unspecified femur, initial encounter for closed fracture: Secondary | ICD-10-CM | POA: Insufficient documentation

## 2013-10-02 DIAGNOSIS — I10 Essential (primary) hypertension: Secondary | ICD-10-CM | POA: Diagnosis present

## 2013-10-02 DIAGNOSIS — Z8673 Personal history of transient ischemic attack (TIA), and cerebral infarction without residual deficits: Secondary | ICD-10-CM

## 2013-10-02 DIAGNOSIS — X58XXXA Exposure to other specified factors, initial encounter: Secondary | ICD-10-CM | POA: Insufficient documentation

## 2013-10-02 DIAGNOSIS — Z01818 Encounter for other preprocedural examination: Secondary | ICD-10-CM

## 2013-10-02 DIAGNOSIS — F329 Major depressive disorder, single episode, unspecified: Secondary | ICD-10-CM | POA: Diagnosis present

## 2013-10-02 DIAGNOSIS — M25559 Pain in unspecified hip: Secondary | ICD-10-CM | POA: Insufficient documentation

## 2013-10-02 DIAGNOSIS — D62 Acute posthemorrhagic anemia: Secondary | ICD-10-CM | POA: Diagnosis not present

## 2013-10-02 DIAGNOSIS — T84498A Other mechanical complication of other internal orthopedic devices, implants and grafts, initial encounter: Principal | ICD-10-CM | POA: Diagnosis present

## 2013-10-02 DIAGNOSIS — Z7982 Long term (current) use of aspirin: Secondary | ICD-10-CM

## 2013-10-02 HISTORY — DX: Dermatitis, unspecified: L30.9

## 2013-10-02 HISTORY — DX: Cerebral infarction, unspecified: I63.9

## 2013-10-02 HISTORY — DX: Other skin changes: R23.8

## 2013-10-02 HISTORY — DX: Pain in unspecified hip: M25.559

## 2013-10-02 HISTORY — DX: Shortness of breath: R06.02

## 2013-10-02 HISTORY — DX: Cardiac arrhythmia, unspecified: I49.9

## 2013-10-02 HISTORY — DX: Spontaneous ecchymoses: R23.3

## 2013-10-02 LAB — URINALYSIS, ROUTINE W REFLEX MICROSCOPIC
Bilirubin Urine: NEGATIVE
Glucose, UA: NEGATIVE mg/dL
Hgb urine dipstick: NEGATIVE
Ketones, ur: NEGATIVE mg/dL
Leukocytes, UA: NEGATIVE
Nitrite: NEGATIVE
Protein, ur: NEGATIVE mg/dL
Specific Gravity, Urine: 1.013 (ref 1.005–1.030)
Urobilinogen, UA: 1 mg/dL (ref 0.0–1.0)
pH: 8 (ref 5.0–8.0)

## 2013-10-02 LAB — CBC
HCT: 35.9 % — ABNORMAL LOW (ref 36.0–46.0)
Hemoglobin: 11.8 g/dL — ABNORMAL LOW (ref 12.0–15.0)
MCH: 31.1 pg (ref 26.0–34.0)
MCHC: 32.9 g/dL (ref 30.0–36.0)
MCV: 94.5 fL (ref 78.0–100.0)
Platelets: 399 10*3/uL (ref 150–400)
RBC: 3.8 MIL/uL — ABNORMAL LOW (ref 3.87–5.11)
RDW: 14 % (ref 11.5–15.5)
WBC: 7.6 10*3/uL (ref 4.0–10.5)

## 2013-10-02 LAB — COMPREHENSIVE METABOLIC PANEL
ALT: 11 U/L (ref 0–35)
AST: 24 U/L (ref 0–37)
Albumin: 3.2 g/dL — ABNORMAL LOW (ref 3.5–5.2)
Alkaline Phosphatase: 114 U/L (ref 39–117)
Anion gap: 10 (ref 5–15)
BUN: 10 mg/dL (ref 6–23)
CO2: 26 mEq/L (ref 19–32)
Calcium: 8.8 mg/dL (ref 8.4–10.5)
Chloride: 93 mEq/L — ABNORMAL LOW (ref 96–112)
Creatinine, Ser: 0.62 mg/dL (ref 0.50–1.10)
GFR calc Af Amer: >90 mL/min (ref >90)
GFR calc non Af Amer: 78 mL/min — ABNORMAL LOW (ref >90)
Glucose, Bld: 97 mg/dL (ref 70–99)
Potassium: 4.6 mEq/L (ref 3.7–5.3)
Sodium: 129 mEq/L — ABNORMAL LOW (ref 137–147)
Total Bilirubin: 0.3 mg/dL (ref 0.3–1.2)
Total Protein: 7.5 g/dL (ref 6.0–8.3)

## 2013-10-02 LAB — PROTIME-INR
INR: 1.18 (ref 0.00–1.49)
Prothrombin Time: 15 seconds (ref 11.6–15.2)

## 2013-10-02 LAB — SURGICAL PCR SCREEN
MRSA, PCR: NEGATIVE
Staphylococcus aureus: NEGATIVE

## 2013-10-02 LAB — APTT: aPTT: 33 seconds (ref 24–37)

## 2013-10-02 NOTE — Progress Notes (Signed)
ABNORMAL CMET faxed to Dr. Lequita Halt

## 2013-10-02 NOTE — Anesthesia Preprocedure Evaluation (Addendum)
Anesthesia Evaluation  Patient identified by MRN, date of birth, ID band Patient awake    Reviewed: Allergy & Precautions, H&P , NPO status , Patient's Chart, lab work & pertinent test results  Airway Mallampati: II TM Distance: >3 FB Neck ROM: full    Dental no notable dental hx. (+) Teeth Intact, Dental Advisory Given   Pulmonary neg pulmonary ROS, shortness of breath and with exertion,  breath sounds clear to auscultation  Pulmonary exam normal       Cardiovascular Exercise Tolerance: Good hypertension, Pt. on medications + dysrhythmias Atrial Fibrillation Rhythm:regular Rate:Normal  NSR today   Neuro/Psych Anxiety Depression CVA negative neurological ROS  negative psych ROS   GI/Hepatic negative GI ROS, Neg liver ROS,   Endo/Other  negative endocrine ROS  Renal/GU negative Renal ROS  negative genitourinary   Musculoskeletal   Abdominal   Peds  Hematology negative hematology ROS (+)   Anesthesia Other Findings   Reproductive/Obstetrics negative OB ROS                         Anesthesia Physical Anesthesia Plan  ASA: III  Anesthesia Plan: General   Post-op Pain Management:    Induction: Intravenous  Airway Management Planned: Oral ETT  Additional Equipment:   Intra-op Plan:   Post-operative Plan: Extubation in OR  Informed Consent:   Plan Discussed with: Surgeon  Anesthesia Plan Comments:         Anesthesia Quick Evaluation

## 2013-10-03 ENCOUNTER — Inpatient Hospital Stay (HOSPITAL_COMMUNITY)
Admission: RE | Admit: 2013-10-03 | Discharge: 2013-10-07 | DRG: 470 | Disposition: A | Discharge status: Skilled Nursing Facility | Payer: Medicare Other | Source: Ambulatory Visit | Attending: Orthopedic Surgery | Admitting: Orthopedic Surgery

## 2013-10-03 ENCOUNTER — Encounter (HOSPITAL_COMMUNITY): Payer: Self-pay | Admitting: Certified Registered Nurse Anesthetist

## 2013-10-03 ENCOUNTER — Encounter (HOSPITAL_COMMUNITY): Payer: Medicare Other | Admitting: Certified Registered Nurse Anesthetist

## 2013-10-03 ENCOUNTER — Inpatient Hospital Stay (HOSPITAL_COMMUNITY): Payer: Medicare Other

## 2013-10-03 ENCOUNTER — Inpatient Hospital Stay (HOSPITAL_COMMUNITY): Payer: Medicare Other | Admitting: Certified Registered Nurse Anesthetist

## 2013-10-03 ENCOUNTER — Encounter (HOSPITAL_COMMUNITY)
Admission: RE | Disposition: A | Discharge status: Skilled Nursing Facility | Payer: Self-pay | Source: Ambulatory Visit | Attending: Orthopedic Surgery

## 2013-10-03 DIAGNOSIS — F329 Major depressive disorder, single episode, unspecified: Secondary | ICD-10-CM | POA: Diagnosis present

## 2013-10-03 DIAGNOSIS — S7291XA Unspecified fracture of right femur, initial encounter for closed fracture: Secondary | ICD-10-CM

## 2013-10-03 DIAGNOSIS — Z7982 Long term (current) use of aspirin: Secondary | ICD-10-CM | POA: Diagnosis not present

## 2013-10-03 DIAGNOSIS — I10 Essential (primary) hypertension: Secondary | ICD-10-CM | POA: Diagnosis present

## 2013-10-03 DIAGNOSIS — S7290XA Unspecified fracture of unspecified femur, initial encounter for closed fracture: Secondary | ICD-10-CM

## 2013-10-03 DIAGNOSIS — T84498A Other mechanical complication of other internal orthopedic devices, implants and grafts, initial encounter: Secondary | ICD-10-CM | POA: Diagnosis present

## 2013-10-03 DIAGNOSIS — M25559 Pain in unspecified hip: Secondary | ICD-10-CM | POA: Diagnosis present

## 2013-10-03 DIAGNOSIS — Z9289 Personal history of other medical treatment: Secondary | ICD-10-CM

## 2013-10-03 DIAGNOSIS — Z01812 Encounter for preprocedural laboratory examination: Secondary | ICD-10-CM | POA: Diagnosis not present

## 2013-10-03 DIAGNOSIS — Y831 Surgical operation with implant of artificial internal device as the cause of abnormal reaction of the patient, or of later complication, without mention of misadventure at the time of the procedure: Secondary | ICD-10-CM | POA: Diagnosis present

## 2013-10-03 DIAGNOSIS — M810 Age-related osteoporosis without current pathological fracture: Secondary | ICD-10-CM | POA: Diagnosis present

## 2013-10-03 DIAGNOSIS — Z681 Body mass index (BMI) 19 or less, adult: Secondary | ICD-10-CM | POA: Diagnosis not present

## 2013-10-03 DIAGNOSIS — S7291XP Unspecified fracture of right femur, subsequent encounter for closed fracture with malunion: Secondary | ICD-10-CM

## 2013-10-03 DIAGNOSIS — Z96641 Presence of right artificial hip joint: Secondary | ICD-10-CM

## 2013-10-03 DIAGNOSIS — D62 Acute posthemorrhagic anemia: Secondary | ICD-10-CM | POA: Diagnosis not present

## 2013-10-03 DIAGNOSIS — Z881 Allergy status to other antibiotic agents status: Secondary | ICD-10-CM | POA: Diagnosis not present

## 2013-10-03 DIAGNOSIS — F411 Generalized anxiety disorder: Secondary | ICD-10-CM | POA: Diagnosis present

## 2013-10-03 DIAGNOSIS — F3289 Other specified depressive episodes: Secondary | ICD-10-CM | POA: Diagnosis present

## 2013-10-03 DIAGNOSIS — S7290XK Unspecified fracture of unspecified femur, subsequent encounter for closed fracture with nonunion: Secondary | ICD-10-CM | POA: Diagnosis present

## 2013-10-03 DIAGNOSIS — Z79899 Other long term (current) drug therapy: Secondary | ICD-10-CM | POA: Diagnosis not present

## 2013-10-03 DIAGNOSIS — Z8673 Personal history of transient ischemic attack (TIA), and cerebral infarction without residual deficits: Secondary | ICD-10-CM | POA: Diagnosis not present

## 2013-10-03 HISTORY — PX: HIP ARTHROPLASTY: SHX981

## 2013-10-03 SURGERY — HEMIARTHROPLASTY, HIP, DIRECT ANTERIOR APPROACH, FOR FRACTURE
Anesthesia: General | Site: Hip | Laterality: Right

## 2013-10-03 MED ORDER — CEFAZOLIN SODIUM-DEXTROSE 2-3 GM-% IV SOLR
INTRAVENOUS | Status: AC
  Filled 2013-10-03: qty 50

## 2013-10-03 MED ORDER — MENTHOL 3 MG MT LOZG
1.0000 | LOZENGE | OROMUCOSAL | Status: DC | PRN
  Filled 2013-10-03: qty 9

## 2013-10-03 MED ORDER — HYDROMORPHONE HCL PF 1 MG/ML IJ SOLN: 0.2500 mg | INTRAMUSCULAR | Status: DC | PRN

## 2013-10-03 MED ORDER — CEFAZOLIN SODIUM-DEXTROSE 2-3 GM-% IV SOLR: 2.0000 g | INTRAVENOUS | Status: DC

## 2013-10-03 MED ORDER — LACTATED RINGERS IV SOLN: INTRAVENOUS | Status: DC

## 2013-10-03 MED ORDER — FLEET ENEMA 7-19 GM/118ML RE ENEM: 1.0000 | ENEMA | Freq: Once | RECTAL | Status: AC | PRN

## 2013-10-03 MED ORDER — BUPIVACAINE HCL (PF) 0.25 % IJ SOLN
INTRAMUSCULAR | Status: AC
  Filled 2013-10-03: qty 30

## 2013-10-03 MED ORDER — MORPHINE SULFATE 2 MG/ML IJ SOLN: 0.5000 mg | INTRAMUSCULAR | Status: DC | PRN

## 2013-10-03 MED ORDER — DEXAMETHASONE SODIUM PHOSPHATE 10 MG/ML IJ SOLN
INTRAMUSCULAR | Status: DC | PRN
  Administered 2013-10-03: 10 mg via INTRAVENOUS

## 2013-10-03 MED ORDER — PROPOFOL 10 MG/ML IV BOLUS
INTRAVENOUS | Status: DC | PRN
  Administered 2013-10-03: 120 mg via INTRAVENOUS

## 2013-10-03 MED ORDER — ONDANSETRON HCL 4 MG/2ML IJ SOLN
INTRAMUSCULAR | Status: DC | PRN
Start: 2013-10-03 — End: 2013-10-03
  Administered 2013-10-03: 4 mg via INTRAVENOUS

## 2013-10-03 MED ORDER — LIDOCAINE HCL (CARDIAC) 20 MG/ML IV SOLN
INTRAVENOUS | Status: AC
  Filled 2013-10-03: qty 5

## 2013-10-03 MED ORDER — POTASSIUM CHLORIDE CRYS ER 20 MEQ PO TBCR
20.0000 meq | EXTENDED_RELEASE_TABLET | ORAL | Status: DC
  Administered 2013-10-04 – 2013-10-06 (×2): 20 meq via ORAL
  Filled 2013-10-03 (×2): qty 1

## 2013-10-03 MED ORDER — STERILE WATER FOR IRRIGATION IR SOLN
Status: DC | PRN
  Administered 2013-10-03: 1500 mL

## 2013-10-03 MED ORDER — ACETAMINOPHEN 325 MG PO TABS
650.0000 mg | ORAL_TABLET | Freq: Four times a day (QID) | ORAL | Status: DC | PRN
Start: 2013-10-04 — End: 2013-10-07
  Administered 2013-10-04: 650 mg via ORAL
  Filled 2013-10-03 (×2): qty 2

## 2013-10-03 MED ORDER — GLYCOPYRROLATE 0.2 MG/ML IJ SOLN
INTRAMUSCULAR | Status: DC | PRN
  Administered 2013-10-03: 0.6 mg via INTRAVENOUS

## 2013-10-03 MED ORDER — FENTANYL CITRATE 0.05 MG/ML IJ SOLN
INTRAMUSCULAR | Status: DC | PRN
  Administered 2013-10-03 (×3): 25 ug via INTRAVENOUS
  Administered 2013-10-03: 50 ug via INTRAVENOUS
  Administered 2013-10-03 (×3): 25 ug via INTRAVENOUS
  Administered 2013-10-03: 50 ug via INTRAVENOUS

## 2013-10-03 MED ORDER — METOPROLOL TARTRATE 25 MG PO TABS
25.0000 mg | ORAL_TABLET | Freq: Every day | ORAL | Status: DC
  Administered 2013-10-03 – 2013-10-06 (×4): 25 mg via ORAL
  Filled 2013-10-03 (×5): qty 1

## 2013-10-03 MED ORDER — BISACODYL 10 MG RE SUPP: 10.0000 mg | Freq: Every day | RECTAL | Status: DC | PRN

## 2013-10-03 MED ORDER — ONDANSETRON HCL 4 MG/2ML IJ SOLN: 4.0000 mg | Freq: Four times a day (QID) | INTRAMUSCULAR | Status: DC | PRN

## 2013-10-03 MED ORDER — NEOSTIGMINE METHYLSULFATE 10 MG/10ML IV SOLN
INTRAVENOUS | Status: AC
  Filled 2013-10-03: qty 1

## 2013-10-03 MED ORDER — PHENOL 1.4 % MT LIQD
1.0000 | OROMUCOSAL | Status: DC | PRN
Start: 2013-10-03 — End: 2013-10-07

## 2013-10-03 MED ORDER — PROPOFOL 10 MG/ML IV BOLUS
INTRAVENOUS | Status: AC
  Filled 2013-10-03: qty 20

## 2013-10-03 MED ORDER — DOCUSATE SODIUM 100 MG PO CAPS
100.0000 mg | ORAL_CAPSULE | Freq: Two times a day (BID) | ORAL | Status: DC
  Administered 2013-10-03 – 2013-10-07 (×8): 100 mg via ORAL

## 2013-10-03 MED ORDER — BUPIVACAINE LIPOSOME 1.3 % IJ SUSP
INTRAMUSCULAR | Status: DC | PRN
  Administered 2013-10-03: 20 mL

## 2013-10-03 MED ORDER — ONDANSETRON HCL 4 MG/2ML IJ SOLN
INTRAMUSCULAR | Status: AC
  Filled 2013-10-03: qty 2

## 2013-10-03 MED ORDER — SODIUM CHLORIDE 0.9 % IR SOLN
Status: DC | PRN
  Administered 2013-10-03: 1000 mL

## 2013-10-03 MED ORDER — AMLODIPINE BESYLATE 5 MG PO TABS
5.0000 mg | ORAL_TABLET | Freq: Every morning | ORAL | Status: DC
  Administered 2013-10-04 – 2013-10-06 (×3): 5 mg via ORAL
  Filled 2013-10-03 (×4): qty 1

## 2013-10-03 MED ORDER — POLYETHYLENE GLYCOL 3350 17 G PO PACK
17.0000 g | PACK | Freq: Every day | ORAL | Status: DC | PRN
Start: 2013-10-03 — End: 2013-10-07
  Administered 2013-10-04 – 2013-10-07 (×3): 17 g via ORAL

## 2013-10-03 MED ORDER — 0.9 % SODIUM CHLORIDE (POUR BTL) OPTIME
TOPICAL | Status: DC | PRN
  Administered 2013-10-03: 1000 mL

## 2013-10-03 MED ORDER — CEFAZOLIN SODIUM-DEXTROSE 2-3 GM-% IV SOLR
2.0000 g | Freq: Four times a day (QID) | INTRAVENOUS | Status: AC
Start: 2013-10-03 — End: 2013-10-03
  Administered 2013-10-03 (×3): 2 g via INTRAVENOUS
  Filled 2013-10-03 (×2): qty 50

## 2013-10-03 MED ORDER — METOCLOPRAMIDE HCL 10 MG PO TABS: 5.0000 mg | ORAL_TABLET | Freq: Three times a day (TID) | ORAL | Status: DC | PRN

## 2013-10-03 MED ORDER — LACTATED RINGERS IV SOLN
INTRAVENOUS | Status: DC | PRN
  Administered 2013-10-03 (×3): via INTRAVENOUS

## 2013-10-03 MED ORDER — ENOXAPARIN SODIUM 40 MG/0.4ML ~~LOC~~ SOLN
40.0000 mg | SUBCUTANEOUS | Status: DC
  Administered 2013-10-04 – 2013-10-07 (×4): 40 mg via SUBCUTANEOUS
  Filled 2013-10-03 (×5): qty 0.4

## 2013-10-03 MED ORDER — LIDOCAINE HCL (CARDIAC) 20 MG/ML IV SOLN
INTRAVENOUS | Status: DC | PRN
  Administered 2013-10-03: 100 mg via INTRAVENOUS

## 2013-10-03 MED ORDER — ROCURONIUM BROMIDE 100 MG/10ML IV SOLN
INTRAVENOUS | Status: DC | PRN
  Administered 2013-10-03: 30 mg via INTRAVENOUS

## 2013-10-03 MED ORDER — ONDANSETRON HCL 4 MG PO TABS: 4.0000 mg | ORAL_TABLET | Freq: Four times a day (QID) | ORAL | Status: DC | PRN

## 2013-10-03 MED ORDER — BIOTENE DRY MOUTH MT LIQD: 15.0000 mL | Freq: Two times a day (BID) | OROMUCOSAL | Status: DC | PRN

## 2013-10-03 MED ORDER — CEFAZOLIN SODIUM-DEXTROSE 2-3 GM-% IV SOLR
2.0000 g | INTRAVENOUS | Status: AC
  Administered 2013-10-03: 2 g via INTRAVENOUS

## 2013-10-03 MED ORDER — SUCCINYLCHOLINE CHLORIDE 20 MG/ML IJ SOLN
INTRAMUSCULAR | Status: DC | PRN
  Administered 2013-10-03: 100 mg via INTRAVENOUS

## 2013-10-03 MED ORDER — DEXTROSE 5 % IV SOLN: 500.0000 mg | Freq: Four times a day (QID) | INTRAVENOUS | Status: DC | PRN

## 2013-10-03 MED ORDER — CITALOPRAM HYDROBROMIDE 40 MG PO TABS
40.0000 mg | ORAL_TABLET | Freq: Every morning | ORAL | Status: DC
  Administered 2013-10-04 – 2013-10-07 (×4): 40 mg via ORAL
  Filled 2013-10-03 (×4): qty 1

## 2013-10-03 MED ORDER — ZIPRASIDONE HCL 80 MG PO CAPS
80.0000 mg | ORAL_CAPSULE | Freq: Every day | ORAL | Status: DC
  Administered 2013-10-03 – 2013-10-06 (×4): 80 mg via ORAL
  Filled 2013-10-03 (×5): qty 1

## 2013-10-03 MED ORDER — LORAZEPAM 0.5 MG PO TABS
0.5000 mg | ORAL_TABLET | Freq: Two times a day (BID) | ORAL | Status: DC
  Administered 2013-10-03 – 2013-10-07 (×8): 0.5 mg via ORAL
  Filled 2013-10-03 (×9): qty 1

## 2013-10-03 MED ORDER — SODIUM CHLORIDE 0.9 % IV SOLN: INTRAVENOUS | Status: DC

## 2013-10-03 MED ORDER — DEXAMETHASONE SODIUM PHOSPHATE 10 MG/ML IJ SOLN
INTRAMUSCULAR | Status: AC
  Filled 2013-10-03: qty 1

## 2013-10-03 MED ORDER — SODIUM CHLORIDE 0.9 % IJ SOLN
INTRAMUSCULAR | Status: DC | PRN
  Administered 2013-10-03: 10 mL

## 2013-10-03 MED ORDER — ACETAMINOPHEN 10 MG/ML IV SOLN
1000.0000 mg | Freq: Once | INTRAVENOUS | Status: AC
  Administered 2013-10-03: 1000 mg via INTRAVENOUS
  Filled 2013-10-03: qty 100

## 2013-10-03 MED ORDER — METOPROLOL TARTRATE 50 MG PO TABS
50.0000 mg | ORAL_TABLET | Freq: Every day | ORAL | Status: DC
  Administered 2013-10-05 – 2013-10-07 (×3): 50 mg via ORAL
  Filled 2013-10-03 (×4): qty 1

## 2013-10-03 MED ORDER — GLYCOPYRROLATE 0.2 MG/ML IJ SOLN
INTRAMUSCULAR | Status: AC
  Filled 2013-10-03: qty 3

## 2013-10-03 MED ORDER — POLYVINYL ALCOHOL 1.4 % OP SOLN
1.0000 [drp] | Freq: Three times a day (TID) | OPHTHALMIC | Status: DC | PRN
  Administered 2013-10-04: 1 [drp] via OPHTHALMIC
  Filled 2013-10-03: qty 15

## 2013-10-03 MED ORDER — SODIUM CHLORIDE 0.9 % IJ SOLN
INTRAMUSCULAR | Status: AC
  Filled 2013-10-03: qty 50

## 2013-10-03 MED ORDER — ACETAMINOPHEN 650 MG RE SUPP: 650.0000 mg | Freq: Four times a day (QID) | RECTAL | Status: DC | PRN

## 2013-10-03 MED ORDER — TRAMADOL HCL 50 MG PO TABS
50.0000 mg | ORAL_TABLET | Freq: Four times a day (QID) | ORAL | Status: DC | PRN
  Administered 2013-10-04: 50 mg via ORAL
  Administered 2013-10-05 – 2013-10-07 (×5): 100 mg via ORAL
  Filled 2013-10-03 (×4): qty 2
  Filled 2013-10-03: qty 1
  Filled 2013-10-03: qty 2

## 2013-10-03 MED ORDER — METOCLOPRAMIDE HCL 5 MG/ML IJ SOLN: 5.0000 mg | Freq: Three times a day (TID) | INTRAMUSCULAR | Status: DC | PRN

## 2013-10-03 MED ORDER — BUPIVACAINE LIPOSOME 1.3 % IJ SUSP
20.0000 mL | Freq: Once | INTRAMUSCULAR | Status: DC
  Filled 2013-10-03: qty 20

## 2013-10-03 MED ORDER — DEXAMETHASONE SODIUM PHOSPHATE 10 MG/ML IJ SOLN: 10.0000 mg | Freq: Once | INTRAMUSCULAR | Status: DC

## 2013-10-03 MED ORDER — FENTANYL CITRATE 0.05 MG/ML IJ SOLN
INTRAMUSCULAR | Status: AC
  Filled 2013-10-03: qty 5

## 2013-10-03 MED ORDER — METOPROLOL TARTRATE 25 MG PO TABS: 25.0000 mg | ORAL_TABLET | Freq: Two times a day (BID) | ORAL | Status: DC

## 2013-10-03 MED ORDER — SODIUM CHLORIDE 0.9 % IV SOLN
INTRAVENOUS | Status: DC
  Administered 2013-10-03 – 2013-10-04 (×2): via INTRAVENOUS

## 2013-10-03 MED ORDER — NEOSTIGMINE METHYLSULFATE 10 MG/10ML IV SOLN
INTRAVENOUS | Status: DC | PRN
  Administered 2013-10-03: 5 mg via INTRAVENOUS

## 2013-10-03 MED ORDER — METHOCARBAMOL 500 MG PO TABS
500.0000 mg | ORAL_TABLET | Freq: Four times a day (QID) | ORAL | Status: DC | PRN
Start: 2013-10-03 — End: 2013-10-07

## 2013-10-03 MED ORDER — BUPIVACAINE HCL (PF) 0.25 % IJ SOLN
INTRAMUSCULAR | Status: DC | PRN
  Administered 2013-10-03: 30 mL

## 2013-10-03 MED ORDER — HYDROCODONE-ACETAMINOPHEN 5-325 MG PO TABS: 1.0000 | ORAL_TABLET | Freq: Four times a day (QID) | ORAL | Status: DC | PRN

## 2013-10-03 MED ORDER — GALANTAMINE HYDROBROMIDE ER 8 MG PO CP24
8.0000 mg | ORAL_CAPSULE | Freq: Every day | ORAL | Status: DC
  Filled 2013-10-03 (×2): qty 1

## 2013-10-03 MED ORDER — CHLORHEXIDINE GLUCONATE 4 % EX LIQD: 60.0000 mL | Freq: Once | CUTANEOUS | Status: DC

## 2013-10-03 MED ORDER — ROCURONIUM BROMIDE 100 MG/10ML IV SOLN
INTRAVENOUS | Status: AC
  Filled 2013-10-03: qty 1

## 2013-10-03 SURGICAL SUPPLY — 54 items
BAG ZIPLOCK 12X15 (MISCELLANEOUS) ×2 IMPLANT
BIT DRILL 2.8X128 (BIT) ×2 IMPLANT
BLADE SAW SAG 73X25 THK (BLADE) ×1
BLADE SAW SGTL 73X25 THK (BLADE) ×1 IMPLANT
BRUSH FEMORAL CANAL (MISCELLANEOUS) ×4 IMPLANT
CAPT HIP MOP CEMENTED ×2 IMPLANT
CEMENT BONE DEPUY (Cement) ×4 IMPLANT
CEMENT RESTRICTOR DEPUY SZ 3 (Cement) ×2 IMPLANT
CLOSURE STERI-STRIP 1/4X4 (GAUZE/BANDAGES/DRESSINGS) ×2 IMPLANT
DRAPE INCISE IOBAN 66X45 STRL (DRAPES) ×2 IMPLANT
DRAPE ORTHO SPLIT 77X108 STRL (DRAPES) ×1
DRAPE POUCH INSTRU U-SHP 10X18 (DRAPES) ×2 IMPLANT
DRAPE SURG ORHT 6 SPLT 77X108 (DRAPES) ×1 IMPLANT
DRAPE U-SHAPE 47X51 STRL (DRAPES) ×2 IMPLANT
DRSG ADAPTIC 3X8 NADH LF (GAUZE/BANDAGES/DRESSINGS) ×2 IMPLANT
DRSG EMULSION OIL 3X16 NADH (GAUZE/BANDAGES/DRESSINGS) ×2 IMPLANT
DRSG MEPILEX BORDER 4X12 (GAUZE/BANDAGES/DRESSINGS) ×2 IMPLANT
DRSG MEPILEX BORDER 4X4 (GAUZE/BANDAGES/DRESSINGS) ×2 IMPLANT
DRSG MEPILEX BORDER 4X8 (GAUZE/BANDAGES/DRESSINGS) ×2 IMPLANT
DRSG PAD ABDOMINAL 8X10 ST (GAUZE/BANDAGES/DRESSINGS) ×2 IMPLANT
DURAPREP 26ML APPLICATOR (WOUND CARE) ×2 IMPLANT
ELECT REM PT RETURN 9FT ADLT (ELECTROSURGICAL) ×2
ELECTRODE REM PT RTRN 9FT ADLT (ELECTROSURGICAL) ×1 IMPLANT
EVACUATOR 1/8 PVC DRAIN (DRAIN) ×2 IMPLANT
FACESHIELD WRAPAROUND (MASK) ×6 IMPLANT
GAUZE SPONGE 4X4 12PLY STRL (GAUZE/BANDAGES/DRESSINGS) ×4 IMPLANT
GLOVE BIO SURGEON STRL SZ7.5 (GLOVE) ×2 IMPLANT
GLOVE BIO SURGEON STRL SZ8 (GLOVE) ×4 IMPLANT
GLOVE BIOGEL PI IND STRL 8 (GLOVE) ×1 IMPLANT
GLOVE BIOGEL PI INDICATOR 8 (GLOVE) ×1
GOWN STRL REUS W/TWL LRG LVL3 (GOWN DISPOSABLE) ×2 IMPLANT
GOWN STRL REUS W/TWL XL LVL3 (GOWN DISPOSABLE) ×2 IMPLANT
HANDPIECE INTERPULSE COAX TIP (DISPOSABLE) ×1
IMMOBILIZER KNEE 20 (SOFTGOODS) ×2
IMMOBILIZER KNEE 20 THIGH 36 (SOFTGOODS) ×1 IMPLANT
KIT BASIN OR (CUSTOM PROCEDURE TRAY) ×2 IMPLANT
LINER ACETABULAR 28MM +4 ESCON (Hips) ×2 IMPLANT
MANIFOLD NEPTUNE II (INSTRUMENTS) ×2 IMPLANT
PACK TOTAL JOINT (CUSTOM PROCEDURE TRAY) ×2 IMPLANT
PADDING CAST COTTON 6X4 STRL (CAST SUPPLIES) ×4 IMPLANT
PASSER SUT SWANSON 36MM LOOP (INSTRUMENTS) ×2 IMPLANT
POSITIONER SURGICAL ARM (MISCELLANEOUS) ×2 IMPLANT
PRESSURIZER FEMORAL UNIV (MISCELLANEOUS) ×2 IMPLANT
SCOTCHCAST PLUS 4X4 WHITE (CAST SUPPLIES) ×2 IMPLANT
SET HNDPC FAN SPRY TIP SCT (DISPOSABLE) ×1 IMPLANT
STRIP CLOSURE SKIN 1/2X4 (GAUZE/BANDAGES/DRESSINGS) ×2 IMPLANT
SUT ETHIBOND NAB CT1 #1 30IN (SUTURE) ×4 IMPLANT
SUT MNCRL AB 4-0 PS2 18 (SUTURE) ×2 IMPLANT
SUT VIC AB 1 CT1 27 (SUTURE) ×3
SUT VIC AB 1 CT1 27XBRD ANTBC (SUTURE) ×3 IMPLANT
SUT VIC AB 2-0 CT1 27 (SUTURE) ×3
SUT VIC AB 2-0 CT1 TAPERPNT 27 (SUTURE) ×3 IMPLANT
TOWEL OR 17X26 10 PK STRL BLUE (TOWEL DISPOSABLE) ×4 IMPLANT
TOWER CARTRIDGE SMART MIX (DISPOSABLE) ×2 IMPLANT

## 2013-10-03 NOTE — H&P (Signed)
Yolanda Baker is an 80 y.o. female.   Chief Complaint: Right hip pain HPI: Yolanda Baker is an 80 yo female s/p right femoral IM nail for an intertrochanteric femur fracture 06/05/13 who has gone on to develop failure of fixation of the fracture. She had done well initially and it appeared to be healing well but in the past month she has had increasing pain (especially the past week) and x-rays have shown progressive migration of the compression screw superiorly in the femoral head. The screw has not perforated the femoral head but is close to doing so. Due to increased pain and failure of fixation of the hardware, she presents now for conversion to a right hip hemiarthroplasty.  Past Medical History  Diagnosis Date  . Osteoporosis   . Hypertension   . Arthritis     neck  . Depression   . Anxiety   . Dysrhythmia     hx A-FLUTTER after hip surg April 2015- take metoprolol  . Shortness of breath     with activity  . Stroke     "mini stroke" per MRI - no deficiets   . Hip joint pain     RT  . Bruises easily   . Eczema     Past Surgical History  Procedure Laterality Date  . Left hip Left 2008  . Abdominal hysterectomy    . Fracture surgery Right 06/05/2013    hip  . Intramedullary (im) nail intertrochanteric Right 06/05/2013    Procedure: INTRAMEDULLARY (IM) NAIL INTERTROCHANTRIC;  Surgeon: Gearlean Alf, MD;  Location: [facility];  Service: Orthopedics;  Laterality: Right;  . Joint replacement      left hip    No family history on file. Social History:  reports that she has never smoked. She has never used smokeless tobacco. She reports that she does not drink alcohol or use illicit drugs.  Allergies:  Allergies  Allergen Reactions  . Flagyl [Metronidazole]     Fingers get "numb"    Medications Prior to Admission  Medication Sig Dispense Refill  . acetaminophen (TYLENOL) 500 MG chewable tablet Chew 1,000 mg by mouth every 6 (six) hours as needed for pain.       Marland Kitchen amLODipine  (NORVASC) 5 MG tablet Take 5 mg by mouth every morning.      Marland Kitchen antiseptic oral rinse (BIOTENE) LIQD 15 mLs by Mouth Rinse route 2 (two) times daily as needed for dry mouth.      . Calcium-Magnesium-Vitamin D (CALCIUM MAGNESIUM PO) Take 1 tablet by mouth daily.      . citalopram (CELEXA) 40 MG tablet Take 40 mg by mouth every morning.       . Colloidal Oatmeal (GOLD BOND ULTRA ECZEMA RELIEF EX) Apply 1 application topically.      . docusate sodium (COLACE) 100 MG capsule Take 100 mg by mouth at bedtime.      Marland Kitchen galantamine (RAZADYNE ER) 8 MG 24 hr capsule Take 8 mg by mouth daily with breakfast.      . LORazepam (ATIVAN) 0.5 MG tablet Take 0.5 mg by mouth 2 (two) times daily.      . metoprolol tartrate (LOPRESSOR) 25 MG tablet Take 25-50 mg by mouth 2 (two) times daily. 2 in the morning and 1 at night      . Multiple Vitamin (MULTIVITAMIN WITH MINERALS) TABS tablet Take 1 tablet by mouth daily.      . polyvinyl alcohol (LIQUIFILM TEARS) 1.4 % ophthalmic solution Place 1  drop into both eyes 3 (three) times daily as needed for dry eyes.      . potassium chloride SA (K-DUR,KLOR-CON) 20 MEQ tablet Take 20 mEq by mouth every other day.      . psyllium (METAMUCIL) 58.6 % powder Take 1 packet by mouth 2 (two) times daily as needed (Constipation). Pt takes 1 tablespoon at each dose      . traMADol (ULTRAM) 50 MG tablet Take 50 mg by mouth 3 (three) times daily.      . vitamin E 400 UNIT capsule Take 400 Units by mouth daily.      . ziprasidone (GEODON) 80 MG capsule Take 80 mg by mouth at bedtime.       Marland Kitchen aspirin EC 81 MG tablet Take 81 mg by mouth daily.      . bisacodyl (DULCOLAX) 5 MG EC tablet Take 5 mg by mouth daily as needed for moderate constipation.      Marland Kitchen denosumab (PROLIA) 60 MG/ML SOLN injection Inject 60 mg into the skin every 6 (six) months. Last injection in June 2015. Administer in upper arm, thigh, or abdomen        Results for orders placed during the hospital encounter of 10/02/13 (from  the past 48 hour(s))  URINALYSIS, ROUTINE W REFLEX MICROSCOPIC     Status: None   Collection Time    10/02/13  9:00 AM      Result Value Ref Range   Color, Urine YELLOW  YELLOW   APPearance CLEAR  CLEAR   Specific Gravity, Urine 1.013  1.005 - 1.030   pH 8.0  5.0 - 8.0   Glucose, UA NEGATIVE  NEGATIVE mg/dL   Hgb urine dipstick NEGATIVE  NEGATIVE   Bilirubin Urine NEGATIVE  NEGATIVE   Ketones, ur NEGATIVE  NEGATIVE mg/dL   Protein, ur NEGATIVE  NEGATIVE mg/dL   Urobilinogen, UA 1.0  0.0 - 1.0 mg/dL   Nitrite NEGATIVE  NEGATIVE   Leukocytes, UA NEGATIVE  NEGATIVE   Comment: MICROSCOPIC NOT DONE ON URINES WITH NEGATIVE PROTEIN, BLOOD, LEUKOCYTES, NITRITE, OR GLUCOSE <1000 mg/dL.  SURGICAL PCR SCREEN     Status: None   Collection Time    10/02/13  9:00 AM      Result Value Ref Range   MRSA, PCR NEGATIVE  NEGATIVE   Staphylococcus aureus NEGATIVE  NEGATIVE   Comment:            The Xpert SA Assay (FDA     approved for NASAL specimens     in patients over 25 years of age),     is one component of     a comprehensive surveillance     program.  Test performance has     been validated by [employer] for patients greater     than or equal to 65 year old.     It is not intended     to diagnose infection nor to     guide or monitor treatment.  APTT     Status: None   Collection Time    10/02/13 10:10 AM      Result Value Ref Range   aPTT 33  24 - 37 seconds  CBC     Status: Abnormal   Collection Time    10/02/13 10:10 AM      Result Value Ref Range   WBC 7.6  4.0 - 10.5 K/uL   RBC 3.80 (*) 3.87 - 5.11 MIL/uL  Hemoglobin 11.8 (*) 12.0 - 15.0 g/dL   HCT 35.9 (*) 36.0 - 46.0 %   MCV 94.5  78.0 - 100.0 fL   MCH 31.1  26.0 - 34.0 pg   MCHC 32.9  30.0 - 36.0 g/dL   RDW 14.0  11.5 - 15.5 %   Platelets 399  150 - 400 K/uL  COMPREHENSIVE METABOLIC PANEL     Status: Abnormal   Collection Time    10/02/13 10:10 AM      Result Value Ref Range   Sodium 129 (*) 137 - 147 mEq/L    Potassium 4.6  3.7 - 5.3 mEq/L   Chloride 93 (*) 96 - 112 mEq/L   CO2 26  19 - 32 mEq/L   Glucose, Bld 97  70 - 99 mg/dL   BUN 10  6 - 23 mg/dL   Creatinine, Ser 0.62  0.50 - 1.10 mg/dL   Calcium 8.8  8.4 - 10.5 mg/dL   Total Protein 7.5  6.0 - 8.3 g/dL   Albumin 3.2 (*) 3.5 - 5.2 g/dL   AST 24  0 - 37 U/L   ALT 11  0 - 35 U/L   Alkaline Phosphatase 114  39 - 117 U/L   Total Bilirubin 0.3  0.3 - 1.2 mg/dL   GFR calc non Af Amer 78 (*) >90 mL/min   GFR calc Af Amer >90  >90 mL/min   Comment: (NOTE)     The eGFR has been calculated using the CKD EPI equation.     This calculation has not been validated in all clinical situations.     eGFR's persistently <90 mL/min signify possible Chronic Kidney     Disease.   Anion gap 10  5 - 15  PROTIME-INR     Status: None   Collection Time    10/02/13 10:10 AM      Result Value Ref Range   Prothrombin Time 15.0  11.6 - 15.2 seconds   INR 1.18  0.00 - 1.49  TYPE AND SCREEN     Status: None   Collection Time    10/02/13 10:10 AM      Result Value Ref Range   ABO/RH(D) O POS     Antibody Screen NEG     Sample Expiration 10/16/2013     Dg Hip Complete Right  10/02/2013   CLINICAL DATA:  Right hip pain  EXAM: RIGHT HIP - COMPLETE 2+ VIEW  COMPARISON:  06/05/2013  FINDINGS: A left hip replacement is noted. Postsurgical changes in the proximal right femur are seen. The medullary rod is stable however the compression screw has been displaced laterally. Significant fragmentation of the proximal right femur is noted consistent with Re fracture and failure of the previous fixation. The pelvic ring remains intact.  IMPRESSION: Failure of prior fixation with displacement of the compression screw as well as increased fragmentation of the proximal right femur with callus formation.   Electronically Signed   By: Inez Catalina M.D.   On: 10/02/2013 11:25    ROS  Blood pressure 131/64, pulse 59, temperature 98.1 F (36.7 C), temperature source Oral, resp.  rate 18, height 5' 2"  (1.575 m), weight 103 lb (46.72 kg), SpO2 100.00%. Physical Exam Physical Examination: General appearance - alert, well appearing, and in no distress Mental status - alert, oriented to person, place, and time Neck - supple, no significant adenopathy Chest - clear to auscultation, no wheezes, rales or rhonchi, symmetric air entry Heart - normal rate,  regular rhythm, normal S1, S2, no murmurs, rubs, clicks or gallops Abdomen - soft, nontender, nondistended, no masses or organomegaly Neurological - alert, oriented, normal speech, no focal findings or movement disorder noted Right leg- pain on any attempted range of motion of right hip with very limited available hip motion. Leg is approximately 1/2 inch short, motor, sensation intact, pulses trace palpable   Assessment/Plan Failure of fixation right femur fracture- Plan on conversion to right hip hemiarthroplasty. Discussed procedure, risks and potential complications with patient and family and they elect to proceed.  Gearlean Alf 10/03/2013, 7:36 AM

## 2013-10-03 NOTE — Transfer of Care (Signed)
Immediate Anesthesia Transfer of Care Note  Patient: Shaguana Chai Dorey  Procedure(s) Performed: Procedure(s) (LRB): CONVERSION OF PREVIOUS RIGHT HIP SURGERY TO A RIGHT TOTAL HIP ARTHROPLASTY (Right)  Patient Location: PACU  Anesthesia Type: General  Level of Consciousness: sedated, patient cooperative and responds to stimulation  Airway & Oxygen Therapy: Patient Spontanous Breathing and Patient connected to face mask oxgen  Post-op Assessment: Report given to PACU RN and Post -op Vital signs reviewed and stable  Post vital signs: Reviewed and stable  Complications: No apparent anesthesia complicationsImmediate Anesthesia Transfer of Care Note  Patient: Keylee Vannorman Spadafora  Procedure(s) Performed: Procedure(s): CONVERSION OF PREVIOUS RIGHT HIP SURGERY TO A RIGHT TOTAL HIP ARTHROPLASTY (Right)  Patient Location: PACU  Anesthesia Type:General  Level of Consciousness: awake, alert  and oriented  Airway & Oxygen Therapy: Patient connected to face mask oxygen  Post-op Assessment: Report given to PACU RN  Post vital signs: Reviewed and stable  Complications: No apparent anesthesia complications

## 2013-10-03 NOTE — Op Note (Signed)
Yolanda Baker, Yolanda Baker NO.:  [id number]  MEDICAL RECORD NO.:  [id number]  LOCATION:  1602                         FACILITY:  [facility]  PHYSICIAN:  Ollen Gross, M.D.    DATE OF BIRTH:  Mar 22, 1923  DATE OF PROCEDURE:  10/03/2013 DATE OF DISCHARGE:                              OPERATIVE REPORT   PREOPERATIVE DIAGNOSIS:  Failure fixation of right proximal femur fracture.  POSTOPERATIVE DIAGNOSIS:  Failure fixation of right proximal femur fracture.  PROCEDURE:  Conversion of previous right hip surgery to total hip arthroplasty.  SURGEON:  Ollen Gross, M.D.  ASSISTANT:  Alexzandrew L. Perkins, P.A.C.  ANESTHESIA:  General.  ESTIMATED BLOOD LOSS:  800.  DRAINS:  Hemovac x1.  COMPLICATIONS:  None.  CONDITION:  Stable to recovery.  BRIEF CLINICAL NOTE:  Yolanda Baker is an 80 year old female, who underwent a right femoral intramedullary nailing for intertrochanteric/subtrochanteric femur fracture approximately 3 months ago.  She was doing well initially and over the past several weeks has had increased pain.  Radiographs have shown migration of the hardware and she presents now for conversion to a hemi versus total hip arthroplasty.  PROCEDURE IN DETAIL:  After successful administration of general anesthetic, the patient was placed on the operative bed in the left lateral decubitus position with right side up and held with the hip positioner.  Right lower extremity was isolated from her perineum with plastic drapes and prepped and draped in the usual sterile fashion.  A posterolateral incision was made with a 10 blade through the subcutaneous tissue to the fascia lata, which was incised in line with the skin incision.  Sciatic nerve was palpated and protected.  She had a tremendous amount of scarring around the posterior femur.  We carefully subperiosteally elevated the scar tissue and posterior tissues and external rotators off the femur.  I then removed the  lag screw from the trochanteric nail and extended the incision to get to the distal interlocking screw, which was also removed.  We then were able to identify the proximal portion of the nail, which had migrated beyond the tip of the trochanter.  She had significant abductor scarring and abductor deficiency.  We removed the nail.  I was then able to dislocate the hip.  It was a malunion/nonunion of the proximal fracture.  We were able to identify the base of the femoral neck, and I made an osteotomy with an oscillating saw.  Then able to identify the femoral canal and then thoroughly irrigated the canal with saline solution.  Placed the canal finder to ensure that were we within the canal for the entire length of the prosthesis.  I then placed a lateralizing reamer and began broaching up to a size 4 for a DePuy Summit cemented stem.  We then inspected the acetabulum and indeed the lag screw, which had eroded through the femoral head had eroded a small portion of the superoposterior acetabulum.  I thus proceeded to a total hip replacement as opposed to just a hemiarthroplasty due to the acetabular deficiency. Acetabular retractors were placed and labrum removed. Reaming starts at 45 mm up to 49 mm and then a 50 mm Pinnacle  acetabular shells placed in anatomic position with excellent purchase.  I placed 3 additional dome screws.  I had decided to utilize a constrained liner given the significant abductor dysfunction.  I was very concerned with the deficient abductor that she be a much higher risk for dislocation.  We thus placed the polyethylene for the 50 mm constrained liner.  The broach was removed.  Prior to placing the constrained liner, we have placed a 28 mm trial liner and with a high offset stem and a 28+ 1.5 head.  The hip was reduced and had good stability, but not perfect stability and thus the constrained liner was necessary.  By placing the leg on top of each other, the legs  were felt to be equal.  The hip was dislocated prior to placement of the constrained liner.  We then removed the broach and placed the appropriate size cement restrictor, which was a size 3 at the appropriate depth in the femoral canal.  The canal was thoroughly irrigated with pulsatile lavage while the Cements mixed.  Once the cement was ready for implantation, the canal was dried and the cement injected into the canal and hand pressurized.  All the cement that was extruded through the screw holes was removed.  We then impacted the size 4 Summit cemented stem with high offset, placed in about 20 degrees of anteversion.  The stem was cemented in position and cement hardened.  All extruded cement had been removed.  The 28+ 1.5 head was placed and then the hip was reduced.  The locking ring was locked into position.  The hip was placed through range of motion and was stable throughout.  Wound was copiously irrigated with saline solution and then posterior soft tissues reattached to the femur through drill holes with Ethibond suture.  Fascia lata was closed over Hemovac drain with running #1 V-Loc suture.  Prior to this closure, the Exparel, which was 20 mL of Exparel mixed with 30 mL of saline was injected into the gluteal muscles, subcu tissues and fascia lata.  An additional 20 mL of 0.25% Marcaine was injected into the same tissues. Subcu then closed with interrupted 2-0 Vicryl and subcuticular running 4- 0 Monocryl.  The drain was hooked to suction.  Incision cleaned and dried.  Steri-Strips and a bulky sterile dressing applied.  She was then awakened and transported to recovery in stable condition.     Ollen GrossFrank Kharisma Glasner, M.D.     FA/MEDQ  D:  10/03/2013  T:  10/03/2013  Job:  [id number]

## 2013-10-03 NOTE — Clinical Social Work Note (Signed)
FL2 placed on chart for MD signature.  Bryant Jamicia Haaland MSW, LCSWA, LCASA, [phone] 

## 2013-10-03 NOTE — Progress Notes (Signed)
Patients' NA  And EKG abnormal. Seen By Dr Leta Jungling.

## 2013-10-03 NOTE — Clinical Social Work Placement (Signed)
Clinical Social Work Department CLINICAL SOCIAL WORK PLACEMENT NOTE 10/03/2013  Patient:  Yolanda Baker, Yolanda Baker  Account Number:  [id number] Admit date:  10/03/2013  Clinical Social Worker:  Cherre Blanc, [place]  Date/time:  10/03/2013 02:08 PM  Clinical Social Work is seeking post-discharge placement for this patient at the following level of care:   SKILLED NURSING   (*CSW will update this form in Epic as items are completed)   10/03/2013  Patient/family provided with [facility] Department of Clinical Social Work's list of facilities offering this level of care within the geographic area requested by the patient (or if unable, by the patient's family).  10/03/2013  Patient/family informed of their freedom to choose among providers that offer the needed level of care, that participate in Medicare, Medicaid or managed care program needed by the patient, have an available bed and are willing to accept the patient.  10/03/2013  Patient/family informed of [facility]' ownership interest in [facility], as well as of the fact that they are under no obligation to receive care at this facility.  PASARR submitted to EDS on 10/03/2013 PASARR number received on 10/03/2013  FL2 transmitted to all facilities in geographic area requested by pt/family on  10/03/2013 FL2 transmitted to all facilities within larger geographic area on   Patient informed that his/her managed care company has contracts with or will negotiate with  certain facilities, including the following:     Patient/family informed of bed offers received:   Patient chooses bed at  Physician recommends and patient chooses bed at    Patient to be transferred to  on   Patient to be transferred to facility by  Patient and family notified of transfer on  Name of family member notified:    The following physician request were entered in Epic:   Additional Comments:    Roddie Mc MSW, [place], [place],  [phone]

## 2013-10-03 NOTE — Clinical Social Work Psychosocial (Signed)
Clinical Social Work Department BRIEF PSYCHOSOCIAL ASSESSMENT 10/03/2013  Patient:  Yolanda Baker, Yolanda Baker     Account Number:  [id number]     Admit date:  10/03/2013  Clinical Social Worker:  Lovey Newcomer  Date/Time:  10/03/2013 01:57 PM  Referred by:  Physician  Date Referred:  10/03/2013 Referred for  SNF Placement   Other Referral:   Interview type:  Patient Other interview type:   Patient alert and oriented at time of assessment.    PSYCHOSOCIAL DATA Living Status:  FACILITY Admitted from facility:  [facility] Level of care:  Independent Living Primary support name:  [place] Primary support relationship to patient:  CHILD, ADULT Degree of support available:   Support is strong.    CURRENT CONCERNS Current Concerns  Post-Acute Placement   Other Concerns:    SOCIAL WORK ASSESSMENT / PLAN CSW met with patient at bedside to complete assessment. Patient has just returned from surgery and is resting comfortably in bed. CSW introduced self and explained reason for visit. Patient states that she has been living at [facility] independent living for the pasat 4 months. Patient states that she understands that [facility] does NOT offer SNF care for their residents. She states that she plans to DC to a SNF like [facility]. The patient states that she has been to [facility] in the past and was impressed with the care she received there. CSW asked patient if any of her children needed to be contacted. She states that she would not like her children contacted at this time as they "are probably tired from being here this whole time." CSW explained SNF search/placement process and answered patient's questions. CSW will follow up with bed offers once available. Patient appeared tired, but was engaged in assessment.   Assessment/plan status:  Psychosocial Support/Ongoing Assessment of Needs Other assessment/ plan:   Complete Fl2, Fax, PASRR   Information/referral to  community resources:   CSW contact information and SNF list given.    PATIENT'S/FAMILY'S RESPONSE TO PLAN OF CARE: Patient plans to DC to SNF when medically stable. CSW will assist as appropriate.       Liz Beach MSW, [place], [place], [phone]

## 2013-10-03 NOTE — Brief Op Note (Signed)
10/03/2013  9:55 AM  PATIENT:  Denyse Dago Cahn  80 y.o. female  PRE-OPERATIVE DIAGNOSIS:  FAILURE OF FIXATION OF RIGHT PROXIMAL FEMUR FRACTURE  POST-OPERATIVE DIAGNOSIS:  FAILURE OF FIXATION OF RIGHT PROXIMAL FEMUR FRACTURE  PROCEDURE:  Procedure(s): CONVERSION OF PREVIOUS RIGHT HIP SURGERY TO A RIGHT TOTAL HIP ARTHROPLASTY (Right)  SURGEON:  Surgeon(s) and Role:    * Loanne Drilling, MD - Primary  PHYSICIAN ASSISTANT:   ASSISTANTS: [name], PA-C   ANESTHESIA:   general  EBL:  Total I/O In: 2000 [I.V.:2000] Out: 1350 [Urine:550; Blood:800]  BLOOD ADMINISTERED:none  DRAINS: (Medium) Hemovact drain(s) in the right hip with  Suction Open   LOCAL MEDICATIONS USED:  OTHER Exparel  COUNTS:  YES  TOURNIQUET:  * No tourniquets in log *  DICTATION: .Other Dictation: Dictation Number [id number]  PLAN OF CARE: Admit to inpatient   PATIENT DISPOSITION:  PACU - hemodynamically stable.

## 2013-10-03 NOTE — Interval H&P Note (Signed)
History and Physical Interval Note:  10/03/2013 7:42 AM  Yolanda Baker  has presented today for surgery, with the diagnosis of FAILURE OF FIXATION OF RIGHT PROXIMAL FEMUR FRACTURE  The various methods of treatment have been discussed with the patient and family. After consideration of risks, benefits and other options for treatment, the patient has consented to  Procedure(s): CONVERSION OF PREVIOUS RIGHT HIP SURGERY TO A RIGHT HIP HEMI-ARTHROPLASTY (Right) as a surgical intervention .  The patient's history has been reviewed, patient examined, no change in status, stable for surgery.  I have reviewed the patient's chart and labs.  Questions were answered to the patient's satisfaction.     Loanne Drilling

## 2013-10-03 NOTE — Anesthesia Postprocedure Evaluation (Signed)
  Anesthesia Post-op Note  Patient: Yolanda Baker  Procedure(s) Performed: Procedure(s) (LRB): CONVERSION OF PREVIOUS RIGHT HIP SURGERY TO A RIGHT TOTAL HIP ARTHROPLASTY (Right)  Patient Location: PACU  Anesthesia Type: General  Level of Consciousness: awake and alert   Airway and Oxygen Therapy: Patient Spontanous Breathing  Post-op Pain: mild  Post-op Assessment: Post-op Vital signs reviewed, Patient's Cardiovascular Status Stable, Respiratory Function Stable, Patent Airway and No signs of Nausea or vomiting  Last Vitals:  Filed Vitals:   10/03/13 1300  BP: 107/47  Pulse: 67  Temp: 37.2 C  Resp: 18    Post-op Vital Signs: stable   Complications: No apparent anesthesia complications

## 2013-10-04 DIAGNOSIS — Z9289 Personal history of other medical treatment: Secondary | ICD-10-CM

## 2013-10-04 DIAGNOSIS — D62 Acute posthemorrhagic anemia: Secondary | ICD-10-CM | POA: Diagnosis not present

## 2013-10-04 LAB — BASIC METABOLIC PANEL
Anion gap: 9 (ref 5–15)
BUN: 14 mg/dL (ref 6–23)
CO2: 25 mEq/L (ref 19–32)
Calcium: 7.2 mg/dL — ABNORMAL LOW (ref 8.4–10.5)
Chloride: 98 mEq/L (ref 96–112)
Creatinine, Ser: 0.79 mg/dL (ref 0.50–1.10)
GFR calc Af Amer: 84 mL/min — ABNORMAL LOW (ref >90)
GFR calc non Af Amer: 72 mL/min — ABNORMAL LOW (ref >90)
Glucose, Bld: 135 mg/dL — ABNORMAL HIGH (ref 70–99)
Potassium: 4.7 mEq/L (ref 3.7–5.3)
Sodium: 132 mEq/L — ABNORMAL LOW (ref 137–147)

## 2013-10-04 LAB — CBC
HCT: 17.7 % — ABNORMAL LOW (ref 36.0–46.0)
HCT: 26.7 % — ABNORMAL LOW (ref 36.0–46.0)
Hemoglobin: 6.1 g/dL — CL (ref 12.0–15.0)
Hemoglobin: 9 g/dL — ABNORMAL LOW (ref 12.0–15.0)
MCH: 32.6 pg (ref 26.0–34.0)
MCH: 30.4 pg (ref 26.0–34.0)
MCHC: 34.5 g/dL (ref 30.0–36.0)
MCHC: 33.7 g/dL (ref 30.0–36.0)
MCV: 94.7 fL (ref 78.0–100.0)
MCV: 90.2 fL (ref 78.0–100.0)
Platelets: 209 10*3/uL (ref 150–400)
Platelets: 240 10*3/uL (ref 150–400)
RBC: 1.87 MIL/uL — ABNORMAL LOW (ref 3.87–5.11)
RBC: 2.96 MIL/uL — ABNORMAL LOW (ref 3.87–5.11)
RDW: 14.1 % (ref 11.5–15.5)
RDW: 15.8 % — ABNORMAL HIGH (ref 11.5–15.5)
WBC: 9 10*3/uL (ref 4.0–10.5)
WBC: 11.7 10*3/uL — ABNORMAL HIGH (ref 4.0–10.5)

## 2013-10-04 LAB — PREPARE RBC (CROSSMATCH)

## 2013-10-04 MED ORDER — SODIUM CHLORIDE 0.9 % IV SOLN: Freq: Once | INTRAVENOUS | Status: DC

## 2013-10-04 MED ORDER — GALANTAMINE HYDROBROMIDE 4 MG PO TABS
4.0000 mg | ORAL_TABLET | Freq: Two times a day (BID) | ORAL | Status: DC
  Administered 2013-10-04 – 2013-10-07 (×7): 4 mg via ORAL
  Filled 2013-10-04 (×10): qty 1

## 2013-10-04 MED ORDER — FUROSEMIDE 10 MG/ML IJ SOLN
10.0000 mg | Freq: Once | INTRAMUSCULAR | Status: AC
  Administered 2013-10-04: 10 mg via INTRAVENOUS
  Filled 2013-10-04: qty 1

## 2013-10-04 NOTE — Plan of Care (Signed)
Problem: Phase II Progression Outcomes Goal: Ambulates Outcome: Not Met (add Reason) Hgb=6.1, received 2 units prbcs

## 2013-10-04 NOTE — Progress Notes (Signed)
   Subjective: 1 Day Post-Op Procedure(s) (LRB): CONVERSION OF PREVIOUS RIGHT HIP SURGERY TO A RIGHT TOTAL HIP ARTHROPLASTY (Right) Patient reports pain as mild.   Patient seen in rounds with Dr. Lequita Halt. Sitting up in bed Patient is well, but has had some minor complaints of pain in the hip, requiring pain medications We will start therapy today.  Plan is to go Skilled nursing facility after hospital stay.  Objective: Vital signs in last 24 hours: Temp:  [97.6 F (36.4 C)-98.9 F (37.2 C)] 98.2 F (36.8 C) (08/09 0522) Pulse Rate:  [63-79] 76 (08/09 0522) Resp:  [14-26] 16 (08/09 0522) BP: (107-162)/(47-72) 136/72 mmHg (08/09 0522) SpO2:  [96 %-100 %] 96 % (08/09 0522)  Intake/Output from previous day:  Intake/Output Summary (Last 24 hours) at 10/04/13 0754 Last data filed at 10/04/13 0546  Gross per 24 hour  Intake 4782.5 ml  Output   2545 ml  Net 2237.5 ml    Intake/Output this shift:    Labs:  Recent Labs  10/02/13 1010 10/04/13 0410  HGB 11.8* 6.1*    Recent Labs  10/02/13 1010 10/04/13 0410  WBC 7.6 9.0  RBC 3.80* 1.87*  HCT 35.9* 17.7*  PLT 399 209    Recent Labs  10/02/13 1010 10/04/13 0410  NA 129* 132*  K 4.6 4.7  CL 93* 98  CO2 26 25  BUN 10 14  CREATININE 0.62 0.79  GLUCOSE 97 135*  CALCIUM 8.8 7.2*    Recent Labs  10/02/13 1010  INR 1.18    EXAM General - Patient is Alert, Appropriate and Oriented Extremity - Neurovascular intact Sensation intact distally Dressing - dressing C/D/I Motor Function - intact, moving foot and toes well on exam.  Hemovac left in place today  Past Medical History  Diagnosis Date  . Osteoporosis   . Hypertension   . Arthritis     neck  . Depression   . Anxiety   . Dysrhythmia     hx A-FLUTTER after hip surg April 2015- take metoprolol  . Shortness of breath     with activity  . Stroke     "mini stroke" per MRI - no deficiets   . Hip joint pain     RT  . Bruises easily   . Eczema      Assessment/Plan: 1 Day Post-Op Procedure(s) (LRB): CONVERSION OF PREVIOUS RIGHT HIP SURGERY TO A RIGHT TOTAL HIP ARTHROPLASTY (Right) Principal Problem:   Closed fracture of femur with nonunion Active Problems:   Femur fracture, right   Closed femur fracture  Estimated body mass index is 18.83 kg/(m^2) as calculated from the following:   Height as of this encounter: 5\' 2"  (1.575 m).   Weight as of this encounter: 46.72 kg (103 lb). Up with therapy  DVT Prophylaxis - Lovenox Weight Bearing As Tolerated right Leg Begin Therapy Hip Preacutions  [name], PA-C Orthopaedic Surgery 10/04/2013, 7:54 AM

## 2013-10-04 NOTE — Progress Notes (Signed)
OT  Note  Patient Details Name: Yolanda Baker MRN: [id number] DOB: 20-Oct-1923   Cancelled Treatment:    Reason Eval/Treat Not Completed: Other (comment) Pt getting blood. OT will perform OT eval in the morning. Thanks,  Alba Cory 10/04/2013, 11:26 AM

## 2013-10-04 NOTE — Progress Notes (Signed)
PT Cancellation Note  Patient Details Name: Yolanda Baker MRN: [id number] DOB: 1923/04/26   Cancelled Treatment:    Reason Eval/Treat Not Completed: Medical issues which prohibited therapy--hgb 6.1-receiving 2nd unit at this time. Will hold PT today and attempt eval on Monday. Thanks.    Rebeca Alert, MPT Pager: [phone]

## 2013-10-05 LAB — CBC
HCT: 26.2 % — ABNORMAL LOW (ref 36.0–46.0)
Hemoglobin: 8.9 g/dL — ABNORMAL LOW (ref 12.0–15.0)
MCH: 31 pg (ref 26.0–34.0)
MCHC: 34 g/dL (ref 30.0–36.0)
MCV: 91.3 fL (ref 78.0–100.0)
Platelets: 248 10*3/uL (ref 150–400)
RBC: 2.87 MIL/uL — ABNORMAL LOW (ref 3.87–5.11)
RDW: 16.2 % — ABNORMAL HIGH (ref 11.5–15.5)
WBC: 11.8 10*3/uL — ABNORMAL HIGH (ref 4.0–10.5)

## 2013-10-05 LAB — TYPE AND SCREEN
ABO/RH(D): O POS
Antibody Screen: NEGATIVE
Unit division: 0
Unit division: 0

## 2013-10-05 LAB — BASIC METABOLIC PANEL
Anion gap: 10 (ref 5–15)
BUN: 13 mg/dL (ref 6–23)
CO2: 28 mEq/L (ref 19–32)
Calcium: 7.7 mg/dL — ABNORMAL LOW (ref 8.4–10.5)
Chloride: 101 mEq/L (ref 96–112)
Creatinine, Ser: 0.73 mg/dL (ref 0.50–1.10)
GFR calc Af Amer: 86 mL/min — ABNORMAL LOW (ref >90)
GFR calc non Af Amer: 74 mL/min — ABNORMAL LOW (ref >90)
Glucose, Bld: 102 mg/dL — ABNORMAL HIGH (ref 70–99)
Potassium: 3.8 mEq/L (ref 3.7–5.3)
Sodium: 139 mEq/L (ref 137–147)

## 2013-10-05 MED ORDER — POLYSACCHARIDE IRON COMPLEX 150 MG PO CAPS
150.0000 mg | ORAL_CAPSULE | Freq: Every day | ORAL | Status: DC
  Administered 2013-10-05 – 2013-10-06 (×2): 150 mg via ORAL
  Filled 2013-10-05 (×2): qty 1

## 2013-10-05 NOTE — Evaluation (Signed)
Occupational Therapy Evaluation Patient Details Name: Yolanda Baker MRN: [id number] DOB: May 03, 1923 Today's Date: 10/05/2013    History of Present Illness 80 y.o. 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   Clinical Impression   Pt presents to OT with decreased I with ADL activity. Pt will benefit from skilled OT to increase I with ADL activity at SNF to increase I prior to returning to ILF    Follow Up Recommendations  SNF    Equipment Recommendations  None recommended by OT       Precautions / Restrictions Precautions Precautions: Posterior Hip Precaution Booklet Issued: Yes (comment) Precaution Comments: sign hung in room, instructed pt/daughter in precautions in detail Restrictions Weight Bearing Restrictions: No RLE Weight Bearing: Weight bearing as tolerated      Mobility Bed Mobility Overal bed mobility: Needs Assistance Bed Mobility: Supine to Sit;Sit to Supine     Supine to sit: Max assist Sit to supine: Max assist   General bed mobility comments: assist to raise trunk and advance RLE; pt 40%  Transfers Overall transfer level: Needs assistance Equipment used: Rolling walker (2 wheeled) Transfers: Sit to/from Stand Sit to Stand: Mod assist         General transfer comment: assist to rise; flexed trunk/neck initially in standing         ADL Overall ADL's : Needs assistance/impaired Eating/Feeding: Set up   Grooming: Set up;Sitting   Upper Body Bathing: Minimal assitance;Sitting   Lower Body Bathing: Sit to/from stand;Maximal assistance   Upper Body Dressing : Minimal assistance;Sitting   Lower Body Dressing: Maximal assistance;Sit to/from stand                 General ADL Comments: Pt plans to go to rehab for further therapy prior to DC home               Pertinent Vitals/Pain Pain Assessment: 0-10 Pain Score: 4  Pain Location: R hip  Pain Descriptors / Indicators:  Sore Pain Intervention(s): Monitored during session;Premedicated before session;Repositioned;Ice applied     Hand Dominance     Extremity/Trunk Assessment Upper Extremity Assessment Upper Extremity Assessment: Generalized weakness   Lower Extremity Assessment Lower Extremity Assessment: RLE deficits/detail RLE Deficits / Details: knee ext 3/5, ankle 5/5, hip +2/5; hip AAROM WFL   Cervical / Trunk Assessment Cervical / Trunk Assessment: Kyphotic   Communication Communication Communication: No difficulties   Cognition Arousal/Alertness: Awake/alert Behavior During Therapy: WFL for tasks assessed/performed Overall Cognitive Status: Within Functional Limits for tasks assessed                                Home Living Family/patient expects to be discharged to:: Skilled nursing facility                             Home Equipment: Dan HumphreysWalker - 2 wheels;Cane - single point          Prior Functioning/Environment Level of Independence: Needs assistance  Gait / Transfers Assistance Needed: walked independently with RW since IM nail in April[facility] / [facility]em[facility] Assistance Needed: assist with bathing/dressing        OT Diagnosis: Generalized weakness   OT Problem List: Decreased activity tolerance;Decreased strength   OT Treatment/Interventions: Self-care/ADL training;DME and/or AE instruction;Patient/family education    OT Goals(Current  goals can be found in the care plan section) Acute Rehab OT Goals Patient Stated Goal: get back to my apartment  OT Frequency:                End of Session    Activity Tolerance: Patient limited by fatigue Patient left: in bed;with family/visitor present;with call bell/phone within reach   Time: 1130-1142 OT Time Calculation (min): 12 min Charges:  OT General Charges $OT Visit: 1 Procedure OT Evaluation $Initial OT Evaluation Tier I: 1 Procedure OT Treatments $Self Care/Home  Management : 8-22 mins G-Codes:    Einar Crow D 10/08/2013, 11:52 AM

## 2013-10-05 NOTE — Evaluation (Signed)
Physical Therapy Evaluation Patient Details Name: ONIYA LOERA MRN: [id number] DOB: November 07, 1923 Today's Date: 10/05/2013   History of Present Illness  80 y.o. 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  Clinical Impression  *Pt is s/p THA resulting in the deficits listed below (see PT Problem List). ** Pt will benefit from skilled PT to increase their independence and safety with mobility to allow discharge to the venue listed below.   Pt ambulated 10' with RW and min A. Good progress expected. SNF recommended.    **    Follow Up Recommendations SNF    Equipment Recommendations  None recommended by PT    Recommendations for Other Services OT consult     Precautions / Restrictions Precautions Precautions: Posterior Hip Precaution Booklet Issued: Yes (comment) Precaution Comments: sign hung in room, instructed pt/daughter in precautions in detail Restrictions Weight Bearing Restrictions: No RLE Weight Bearing: Weight bearing as tolerated      Mobility  Bed Mobility Overal bed mobility: Needs Assistance Bed Mobility: Supine to Sit     Supine to sit: Max assist     General bed mobility comments: assist to raise trunk and advance RLE; pt 40%  Transfers Overall transfer level: Needs assistance Equipment used: Rolling walker (2 wheeled) Transfers: Sit to/from Stand Sit to Stand: Mod assist         General transfer comment: assist to rise; flexed trunk/neck initially in standing  Ambulation/Gait Ambulation/Gait assistance: Min assist Ambulation Distance (Feet): 10 Feet Assistive device: Rolling walker (2 wheeled) Gait Pattern/deviations: Step-to pattern;Trunk flexed;Decreased step length - right     General Gait Details: min A for balance, cues for posture and sequencing  Stairs            Wheelchair Mobility    Modified Rankin (Stroke Patients Only)       Balance                                             Pertinent Vitals/Pain Pain Assessment: 0-10 Pain Score: 5  Pain Location: R hip, with walking; 4/10 at rest Pain Descriptors / Indicators: Sore Pain Intervention(s): Monitored during session;Premedicated before session;Repositioned;Ice applied    Home Living Family/patient expects to be discharged to:: Skilled nursing facility (From ALF, plans to DC to [facility])               Home Equipment: Dan Humphreys - 2 wheels;Gilmer Mor - single point      Prior Function Level of Independence: Needs assistance   Gait / Transfers Assistance Needed: walked independently with RW since IM nail in April, prior to April used [facility]  ADL's / Homemaking Assistance Needed: assist with bathing/dressing        Hand Dominance        Extremity/Trunk Assessment   Upper Extremity Assessment: Overall WFL for tasks assessed           Lower Extremity Assessment: RLE deficits/detail RLE Deficits / Details: knee ext 3/5, ankle 5/5, hip +2/5; hip AAROM WFL    Cervical / Trunk Assessment: Kyphotic  Communication   Communication: No difficulties  Cognition Arousal/Alertness: Awake/alert Behavior During Therapy: WFL for tasks assessed/performed Overall Cognitive Status: Within Functional Limits for tasks assessed  General Comments      Exercises Total Joint Exercises Ankle Circles/Pumps: AROM;Both;10 reps;Supine Quad Sets: AROM;Both;5 reps;Supine Heel Slides: AAROM;Right;10 reps;Supine Hip ABduction/ADduction: AAROM;Right;10 reps;Supine Long Arc Quad: AROM;Right;5 reps;Seated      Assessment/Plan    PT Assessment Patient needs continued PT services  PT Diagnosis Difficulty walking;Acute pain   PT Problem List Decreased strength;Decreased activity tolerance;Decreased mobility;Decreased knowledge of precautions;Pain  PT Treatment Interventions Gait training;Functional mobility training;Therapeutic activities;Patient/family  education;Therapeutic exercise   PT Goals (Current goals can be found in the Care Plan section) Acute Rehab PT Goals Patient Stated Goal: to walk better PT Goal Formulation: With patient/family Time For Goal Achievement: 10/19/13 Potential to Achieve Goals: Good    Frequency Min 6X/week   Barriers to discharge        Co-evaluation               End of Session Equipment Utilized During Treatment: Gait belt Activity Tolerance: Patient tolerated treatment well Patient left: in chair;with call bell/phone within reach;with family/visitor present Nurse Communication: Mobility status         Time: [id number] PT Time Calculation (min): 26 min   Charges:   PT Evaluation $Initial PT Evaluation Tier I: 1 Procedure PT Treatments $Gait Training: 8-22 mins   PT G Codes:          Tamala SerUhlenberg, Drucella Karbowski Kistler 10/05/2013, 10:37 AM [phone]

## 2013-10-05 NOTE — Progress Notes (Signed)
CSW assisting with d/c planning. Pt has accepted a SNF bed at [facility] when stable for d/c. CSW will continue to follow to assist with d/c planning to SNF.  Cori Razor LCSW [phone]

## 2013-10-05 NOTE — Progress Notes (Signed)
   Subjective: 2 Days Post-Op Procedure(s) (LRB): CONVERSION OF PREVIOUS RIGHT HIP SURGERY TO A RIGHT TOTAL HIP ARTHROPLASTY (Right) Patient reports pain as mild.   Patient seen in rounds with Dr. Lequita Halt. Patient is well, but has had some minor complaints of pain in the hip, requiring pain medications Plan is to go Skilled nursing facility after hospital stay.  Objective: Vital signs in last 24 hours: Temp:  [98.3 F (36.8 C)-99.9 F (37.7 C)] 99.1 F (37.3 C) (08/10 0510) Pulse Rate:  [70-86] 86 (08/10 0510) Resp:  [16-22] 16 (08/10 0510) BP: (108-142)/(37-61) 128/61 mmHg (08/10 0510) SpO2:  [96 %-100 %] 98 % (08/10 0510)  Intake/Output from previous day:  Intake/Output Summary (Last 24 hours) at 10/05/13 0743 Last data filed at 10/05/13 0653  Gross per 24 hour  Intake 2676.34 ml  Output   6000 ml  Net -3323.66 ml    Intake/Output this shift:    Labs:  Recent Labs  10/02/13 1010 10/04/13 0410 10/04/13 1830 10/05/13 0447  HGB 11.8* 6.1* 9.0* 8.9*    Recent Labs  10/04/13 1830 10/05/13 0447  WBC 11.7* 11.8*  RBC 2.96* 2.87*  HCT 26.7* 26.2*  PLT 240 248    Recent Labs  10/04/13 0410 10/05/13 0447  NA 132* 139  K 4.7 3.8  CL 98 101  CO2 25 28  BUN 14 13  CREATININE 0.79 0.73  GLUCOSE 135* 102*  CALCIUM 7.2* 7.7*    Recent Labs  10/02/13 1010  INR 1.18    EXAM General - Patient is Alert and Appropriate Extremity - Neurovascular intact Sensation intact distally Dressing/Incision - clean, dry, no drainage Motor Function - intact, moving foot and toes well on exam.   Past Medical History  Diagnosis Date  . Osteoporosis   . Hypertension   . Arthritis     neck  . Depression   . Anxiety   . Dysrhythmia     hx A-FLUTTER after hip surg April 2015- take metoprolol  . Shortness of breath     with activity  . Stroke     "mini stroke" per MRI - no deficiets   . Hip joint pain     RT  . Bruises easily   . Eczema      Assessment/Plan: 2 Days Post-Op Procedure(s) (LRB): CONVERSION OF PREVIOUS RIGHT HIP SURGERY TO A RIGHT TOTAL HIP ARTHROPLASTY (Right) Principal Problem:   Closed fracture of femur with nonunion Active Problems:   Femur fracture, right   Closed femur fracture   Postoperative anemia due to acute blood loss   Postop Transfusion  Estimated body mass index is 18.83 kg/(m^2) as calculated from the following:   Height as of this encounter: 5\' 2"  (1.575 m).   Weight as of this encounter: 46.72 kg (103 lb). Up with therapy Plan for discharge tomorrow Discharge to SNF  DVT Prophylaxis - Lovenox Weight Bearing As Tolerated right Leg DC foley Continue with therapy Hip precautions  [name], PA-C Orthopaedic Surgery 10/05/2013, 7:43 AM

## 2013-10-06 ENCOUNTER — Encounter (HOSPITAL_COMMUNITY): Payer: Self-pay | Admitting: Orthopedic Surgery

## 2013-10-06 LAB — CBC
HCT: 24.8 % — ABNORMAL LOW (ref 36.0–46.0)
Hemoglobin: 8.2 g/dL — ABNORMAL LOW (ref 12.0–15.0)
MCH: 30.6 pg (ref 26.0–34.0)
MCHC: 33.1 g/dL (ref 30.0–36.0)
MCV: 92.5 fL (ref 78.0–100.0)
Platelets: 255 10*3/uL (ref 150–400)
RBC: 2.68 MIL/uL — ABNORMAL LOW (ref 3.87–5.11)
RDW: 16 % — ABNORMAL HIGH (ref 11.5–15.5)
WBC: 12 10*3/uL — ABNORMAL HIGH (ref 4.0–10.5)

## 2013-10-06 MED ORDER — POLYSACCHARIDE IRON COMPLEX 150 MG PO CAPS
150.0000 mg | ORAL_CAPSULE | Freq: Two times a day (BID) | ORAL | Status: DC
  Administered 2013-10-06 – 2013-10-07 (×2): 150 mg via ORAL
  Filled 2013-10-06 (×3): qty 1

## 2013-10-06 NOTE — Progress Notes (Signed)
Physical Therapy Treatment Patient Details Name: Yolanda Baker MRN: [id number] DOB: January 11, 1924 Today's Date: 10/06/2013    History of Present Illness 80 y.o. 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    PT Comments    POD # 3 pm session.  Assisted out of recliner to amb to BR.  Assisted with hygiene.  Amb to bed.  Pt required increased time and MAX assist.  Very unsteady gait.  Follow Up Recommendations        Equipment Recommendations       Recommendations for Other Services       Precautions / Restrictions      Mobility  Bed Mobility    Max assist to support B LE up onto bed. Increased time to position to comfort.              Transfers    Max assist with 75% VC's on proper tech and hand placement plus to avoid hip flex > 90 degrees.  Severe posterior lean.  Very unsteady.                  Ambulation/Gait  amb with RW to BR MAX assist 12 feet.  Very unsteady gait and poor posture.  Amb from BR to bed.  Required increased time and 75% VC's on turn completion and proper walker to self distance.               Stairs            Wheelchair Mobility    Modified Rankin (Stroke Patients Only)       Balance   poor                                   Cognition                            Exercises      General Comments        Pertinent Vitals/Pain      Home Living                      Prior Function            PT Goals (current goals can now be found in the care plan section)      Frequency       PT Plan      Co-evaluation             End of Session           Time: 1330-1355 PT Time Calculation (min): 25 min  Charges:  $Gait Training: 8-22 mins $Therapeutic Activity: 8-22 mins                    G Codes:      Felecia Shelling  PTA [facility]  Acute  Rehab Pager      [phone]

## 2013-10-06 NOTE — Progress Notes (Signed)
   Subjective: 3 Days Post-Op Procedure(s) (LRB): CONVERSION OF PREVIOUS RIGHT HIP SURGERY TO A RIGHT TOTAL HIP ARTHROPLASTY (Right) Patient reports pain as mild.   Patient seen in rounds with Dr. Lequita Halt. Patient is well, but has had some minor complaints of pain in the hip region, requiring pain medications Plan is to go Skilled nursing facility after hospital stay. Her HG is down some today and drainage noted from the wound.  Will observe today and maybe to SNF tomorrow if remains stable.  Objective: Vital signs in last 24 hours: Temp:  [98.7 F (37.1 C)-99.3 F (37.4 C)] 99.3 F (37.4 C) (08/11 0432) Pulse Rate:  [66-77] 77 (08/11 0432) Resp:  [14-18] 16 (08/11 0432) BP: (110-120)/(40-54) 120/54 mmHg (08/11 0432) SpO2:  [95 %-98 %] 96 % (08/11 0432)  Intake/Output from previous day:  Intake/Output Summary (Last 24 hours) at 10/06/13 0945 Last data filed at 10/06/13 5927  Gross per 24 hour  Intake 804.17 ml  Output    950 ml  Net -145.83 ml    Intake/Output this shift:    Labs:  Recent Labs  10/04/13 0410 10/04/13 1830 10/05/13 0447 10/06/13 0415  HGB 6.1* 9.0* 8.9* 8.2*    Recent Labs  10/05/13 0447 10/06/13 0415  WBC 11.8* 12.0*  RBC 2.87* 2.68*  HCT 26.2* 24.8*  PLT 248 255    Recent Labs  10/04/13 0410 10/05/13 0447  NA 132* 139  K 4.7 3.8  CL 98 101  CO2 25 28  BUN 14 13  CREATININE 0.79 0.73  GLUCOSE 135* 102*  CALCIUM 7.2* 7.7*   No results found for this basename: LABPT, INR,  in the last 72 hours  EXAM General - Patient is Alert, Appropriate and Oriented Extremity - Neurovascular intact Sensation intact distally Dorsiflexion/Plantar flexion intact Dressing/Incision - clean, dry, drainage noted on the dressing at time of rounds. Motor Function - intact, moving foot and toes well on exam.   Past Medical History  Diagnosis Date  . Osteoporosis   . Hypertension   . Arthritis     neck  . Depression   . Anxiety   . Dysrhythmia      hx A-FLUTTER after hip surg April 2015- take metoprolol  . Shortness of breath     with activity  . Stroke     "mini stroke" per MRI - no deficiets   . Hip joint pain     RT  . Bruises easily   . Eczema     Assessment/Plan: 3 Days Post-Op Procedure(s) (LRB): CONVERSION OF PREVIOUS RIGHT HIP SURGERY TO A RIGHT TOTAL HIP ARTHROPLASTY (Right) Principal Problem:   Closed fracture of femur with nonunion Active Problems:   Femur fracture, right   Closed femur fracture   Postoperative anemia due to acute blood loss   Postop Transfusion  Estimated body mass index is 18.83 kg/(m^2) as calculated from the following:   Height as of this encounter: 5\' 2"  (1.575 m).   Weight as of this encounter: 46.72 kg (103 lb). Up with therapy Recheck labs int he morning. DVT Prophylaxis - Lovenox Weight Bearing As Tolerated right Leg Continue with therapy  Hip precautions  [name], PA-C Orthopaedic Surgery 10/06/2013, 9:45 AM

## 2013-10-06 NOTE — Progress Notes (Addendum)
Physical Therapy Treatment Patient Details Name: Yolanda Baker MRN: [id number] DOB: [birth year] Today's Date: 10/06/2013    History of Present Illness 80 y.o. 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    PT Comments    **Pt progressing with mobility, she walked 51' with RW and min A today. Performed R THA exercises with assist. REviewed posterior hip precautions as pt was unable to recall them. Tremor noted, pt/daughter state this is pre-existing. *  Follow Up Recommendations  SNF     Equipment Recommendations  None recommended by PT    Recommendations for Other Services OT consult     Precautions / Restrictions Precautions Precautions: Posterior Hip Precaution Booklet Issued: Yes (comment) Precaution Comments: sign hung in room, instructed pt/daughter in precautions in detail; pt recalled 0/3 precautions today Restrictions Weight Bearing Restrictions: No RLE Weight Bearing: Weight bearing as tolerated    Mobility  Bed Mobility               General bed mobility comments: up on BSC at start of tx  Transfers Overall transfer level: Needs assistance Equipment used: Rolling walker (2 wheeled) Transfers: Sit to/from Stand Sit to Stand: Mod assist         General transfer comment: assist to rise; flexed trunk/neck initially in standing; VCs hand placement  Ambulation/Gait Ambulation/Gait assistance: Min assist Ambulation Distance (Feet): 24 Feet Assistive device: Rolling walker (2 wheeled) Gait Pattern/deviations: Step-to pattern;Decreased step length - right;Trunk flexed     General Gait Details: min A for balance, cues for posture and sequencing; 2/4 dyspnea with walking, pt tends to hold breath, cues to breathe, daughter reports pt got SOB with activity PTA   Stairs            Wheelchair Mobility    Modified Rankin (Stroke Patients Only)       Balance                                     Cognition Arousal/Alertness: Awake/alert Behavior During Therapy: WFL for tasks assessed/performed Overall Cognitive Status: Within Functional Limits for tasks assessed                      Exercises Total Joint Exercises Ankle Circles/Pumps: AROM;Both;10 reps;Supine Quad Sets: AROM;Both;Supine;10 reps Short Arc Quad: AROM;Right;10 reps;Supine Heel Slides: AAROM;Right;10 reps;Supine Hip ABduction/ADduction: AAROM;Right;10 reps;Supine Long Arc Quad: AROM;Right;Seated;10 reps    General Comments        Pertinent Vitals/Pain Pain Score: 6  Pain Location: R hip with walking Pain Descriptors / Indicators: Sore Pain Intervention(s): Repositioned;Ice applied;Premedicated before session    Home Living                      Prior Function            PT Goals (current goals can now be found in the care plan section) Acute Rehab PT Goals Patient Stated Goal: be able to walk, return to exercise classes at ALF PT Goal Formulation: With patient/family Time For Goal Achievement: 10/19/13 Potential to Achieve Goals: Good Progress towards PT goals: Progressing toward goals    Frequency  Min 6X/week    PT Plan Current plan remains appropriate    Co-evaluation             End of Session Equipment Utilized During Treatment: Gait  belt Activity Tolerance: Patient tolerated treatment well Patient left: in chair;with call bell/phone within reach;with family/visitor present     Time: 1610-96041005-1034 PT Time Calculation (min): 29 min  Charges:  $Gait Training: 8-22 mins $Therapeutic Exercise: 8-22 mins                    G Codes:      Yolanda Baker, Yolanda Baker 10/06/2013, 10:41 AM [phone]

## 2013-10-07 LAB — BASIC METABOLIC PANEL
Anion gap: 10 (ref 5–15)
BUN: 14 mg/dL (ref 6–23)
CO2: 26 mEq/L (ref 19–32)
Calcium: 8.1 mg/dL — ABNORMAL LOW (ref 8.4–10.5)
Chloride: 97 mEq/L (ref 96–112)
Creatinine, Ser: 0.68 mg/dL (ref 0.50–1.10)
GFR calc Af Amer: 88 mL/min — ABNORMAL LOW (ref >90)
GFR calc non Af Amer: 76 mL/min — ABNORMAL LOW (ref >90)
Glucose, Bld: 109 mg/dL — ABNORMAL HIGH (ref 70–99)
Potassium: 4.3 mEq/L (ref 3.7–5.3)
Sodium: 133 mEq/L — ABNORMAL LOW (ref 137–147)

## 2013-10-07 LAB — CBC
HCT: 30 % — ABNORMAL LOW (ref 36.0–46.0)
Hemoglobin: 10 g/dL — ABNORMAL LOW (ref 12.0–15.0)
MCH: 31.1 pg (ref 26.0–34.0)
MCHC: 33.3 g/dL (ref 30.0–36.0)
MCV: 93.2 fL (ref 78.0–100.0)
Platelets: 404 10*3/uL — ABNORMAL HIGH (ref 150–400)
RBC: 3.22 MIL/uL — ABNORMAL LOW (ref 3.87–5.11)
RDW: 15.9 % — ABNORMAL HIGH (ref 11.5–15.5)
WBC: 13.4 10*3/uL — ABNORMAL HIGH (ref 4.0–10.5)

## 2013-10-07 MED ORDER — POLYSACCHARIDE IRON COMPLEX 150 MG PO CAPS: 150.0000 mg | ORAL_CAPSULE | Freq: Two times a day (BID) | ORAL | Status: DC

## 2013-10-07 MED ORDER — ENOXAPARIN SODIUM 40 MG/0.4ML ~~LOC~~ SOLN: 40.0000 mg | SUBCUTANEOUS | Status: DC

## 2013-10-07 MED ORDER — ONDANSETRON HCL 4 MG PO TABS: 4.0000 mg | ORAL_TABLET | Freq: Four times a day (QID) | ORAL | Status: DC | PRN

## 2013-10-07 MED ORDER — POLYETHYLENE GLYCOL 3350 17 G PO PACK: 17.0000 g | PACK | Freq: Every day | ORAL | Status: DC | PRN

## 2013-10-07 MED ORDER — METHOCARBAMOL 500 MG PO TABS: 500.0000 mg | ORAL_TABLET | Freq: Four times a day (QID) | ORAL | Status: DC | PRN

## 2013-10-07 MED ORDER — HYDROCODONE-ACETAMINOPHEN 5-325 MG PO TABS: 1.0000 | ORAL_TABLET | Freq: Four times a day (QID) | ORAL | Status: DC | PRN

## 2013-10-07 MED ORDER — TRAMADOL HCL 50 MG PO TABS: 50.0000 mg | ORAL_TABLET | Freq: Four times a day (QID) | ORAL | Status: DC | PRN

## 2013-10-07 NOTE — Progress Notes (Addendum)
   Subjective: 4 Days Post-Op Procedure(s) (LRB): CONVERSION OF PREVIOUS RIGHT HIP SURGERY TO A RIGHT TOTAL HIP ARTHROPLASTY (Right) Patient reports pain as mild.   Patient seen in rounds with Dr. Lequita Halt. Patient is well, and has had no acute complaints or problems Patient is ready to go to the SNF  Objective: Vital signs in last 24 hours: Temp:  [98 F (36.7 C)-100.2 F (37.9 C)] 98.8 F (37.1 C) (08/12 0509) Pulse Rate:  [65-76] 65 (08/12 0509) Resp:  [16-18] 18 (08/12 0509) BP: (113-181)/(48-67) 127/48 mmHg (08/12 0509) SpO2:  [93 %-99 %] 99 % (08/12 0509)  Intake/Output from previous day:  Intake/Output Summary (Last 24 hours) at 10/07/13 0724 Last data filed at 10/07/13 0500  Gross per 24 hour  Intake    720 ml  Output    800 ml  Net    -80 ml    Intake/Output this shift:    Labs:  Recent Labs  10/04/13 1830 10/05/13 0447 10/06/13 0415 10/07/13 0527  HGB 9.0* 8.9* 8.2* 10.0*    Recent Labs  10/06/13 0415 10/07/13 0527  WBC 12.0* 13.4*  RBC 2.68* 3.22*  HCT 24.8* 30.0*  PLT 255 404*    Recent Labs  10/05/13 0447 10/07/13 0527  NA 139 133*  K 3.8 4.3  CL 101 97  CO2 28 26  BUN 13 14  CREATININE 0.73 0.68  GLUCOSE 102* 109*  CALCIUM 7.7* 8.1*   No results found for this basename: LABPT, INR,  in the last 72 hours  EXAM: General - Patient is Alert and Appropriate Extremity - Neurovascular intact Sensation intact distally Dorsiflexion/Plantar flexion intact Incision - clean, dry, no drainage, healing Motor Function - intact, moving foot and toes well on exam.   Assessment/Plan: 4 Days Post-Op Procedure(s) (LRB): CONVERSION OF PREVIOUS RIGHT HIP SURGERY TO A RIGHT TOTAL HIP ARTHROPLASTY (Right) Procedure(s) (LRB): CONVERSION OF PREVIOUS RIGHT HIP SURGERY TO A RIGHT TOTAL HIP ARTHROPLASTY (Right) Past Medical History  Diagnosis Date  . Osteoporosis   . Hypertension   . Arthritis     neck  . Depression   . Anxiety   . Dysrhythmia      hx A-FLUTTER after hip surg April 2015- take metoprolol  . Shortness of breath     with activity  . Stroke     "mini stroke" per MRI - no deficiets   . Hip joint pain     RT  . Bruises easily   . Eczema    Principal Problem:   Closed fracture of femur with nonunion Active Problems:   Femur fracture, right   Closed femur fracture   Postoperative anemia due to acute blood loss   Postop Transfusion  Estimated body mass index is 18.83 kg/(m^2) as calculated from the following:   Height as of this encounter: 5\' 2"  (1.575 m).   Weight as of this encounter: 46.72 kg (103 lb). Up with therapy Discharge to SNF Diet - Cardiac diet Follow up - in 2 weeks on August 25th Activity - WBAT Disposition - Skilled nursing facility Condition Upon Discharge - Stable D/C Meds - See DC Summary DVT Prophylaxis - Lovenox for a total of 10 days and then resume her Aspirin 81 mg daily  [name], PA-C Orthopaedic Surgery 10/07/2013, 7:24 AM

## 2013-10-07 NOTE — Progress Notes (Signed)
Clinical Social Work Department CLINICAL SOCIAL WORK PLACEMENT NOTE 10/07/2013  Patient:  Yolanda Baker, Yolanda Baker  Account Number:  [id number] Admit date:  10/03/2013  Clinical Social Worker:  Lavell Luster  Date/time:  10/03/2013 02:08 PM  Clinical Social Work is seeking post-discharge placement for this patient at the following level of care:   SKILLED NURSING   (*CSW will update this form in Epic as items are completed)   10/03/2013  Patient/family provided with [facility] Department of Clinical Social Work's list of facilities offering this level of care within the geographic area requested by the patient (or if unable, by the patient's family).  10/03/2013  Patient/family informed of their freedom to choose among providers that offer the needed level of care, that participate in Medicare, Medicaid or managed care program needed by the patient, have an available bed and are willing to accept the patient.  10/03/2013  Patient/family informed of [facility]' ownership interest in [facility], as well as of the fact that they are under no obligation to receive care at this facility.  PASARR submitted to EDS on 10/03/2013 PASARR number received on 10/03/2013  FL2 transmitted to all facilities in geographic area requested by pt/family on  10/03/2013 FL2 transmitted to all facilities within larger geographic area on   Patient informed that his/her managed care company has contracts with or will negotiate with  certain facilities, including the following:     Patient/family informed of bed offers received:  10/05/2013 Patient chooses bed at [facility] Physician recommends and patient chooses bed at    Patient to be transferred to [facility] on  10/07/2013 Patient to be transferred to facility by [facility] Patient and family notified of transfer on 10/07/2013 Name of family member notified:  DAUGHTER  The following physician request were entered in  Epic:   Additional Comments: Pt / daughter are in agreement with d/c today to SNF via [facility] transport. Nsg reviewed d/c summary, scripts, avs. Scripts are included in d/c packet.  Cori Razor LCSW [phone]

## 2013-10-07 NOTE — Progress Notes (Signed)
Physical Therapy Treatment Patient Details Name: Yolanda Baker MRN: [id number] DOB: [birth year] Today's Date: 10/07/2013    History of Present Illness 80 y.o. 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    PT Comments    POD # 4 assisted pt OOB to [facility] then amb in hallway 2 short distances.  Pt progressing slowly and will need ST Rehab at SNF.  Follow Up Recommendations  SNF     Equipment Recommendations       Recommendations for Other Services       Precautions / Restrictions      Mobility  Bed Mobility Overal bed mobility: Needs Assistance Bed Mobility: Supine to Sit     Supine to sit: Max assist     General bed mobility comments: increased time and 75% VC's on proper tech and THP  Transfers Overall transfer level: Needs assistance Equipment used: Rolling walker (2 wheeled) Transfers: Sit to/from Stand Sit to Stand: Mod assist;Max assist         General transfer comment: 75% VC's on proper hand placment and forward lean as pt demon severe posterior lean/LOB with slow self correction.  Also required increased time to pivot 1/4 turn to [facility].  HIGH FALL RISK.  Ambulation/Gait Ambulation/Gait assistance: Mod assist Ambulation Distance (Feet): 36 Feet (18 feet x 2 one sitting rest break) Assistive device: Rolling walker (2 wheeled) Gait Pattern/deviations: Step-to pattern;Decreased step length - left;Decreased stance time - right;Trunk flexed Gait velocity: decreased   General Gait Details: vaery slow unsteady gait with poor balance and poor posture.  Severe posterior lean and poor self corrective responce.  HIGH FALL RISK.  Limited amb distance as pt demon 3/4 DOE. Required 3 sitting rest breaks during session.     Stairs            Wheelchair Mobility    Modified Rankin (Stroke Patients Only)       Balance                                    Cognition                             Exercises      General Comments        Pertinent Vitals/Pain      Home Living                      Prior Function            PT Goals (current goals can now be found in the care plan section) Progress towards PT goals: Progressing toward goals    Frequency  Min 6X/week    PT Plan      Co-evaluation             End of Session Equipment Utilized During Treatment: Gait belt Activity Tolerance: Patient limited by fatigue       Time: 1040-1120 PT Time Calculation (min): 40 min  Charges:  $Gait Training: 23-37 mins $Therapeutic Activity: 8-22 mins                    G Codes:      Felecia Shelling  PTA [facility]  Acute  Rehab Pager      [phone]

## 2013-10-07 NOTE — Discharge Summary (Signed)
Physician Discharge Summary   Patient ID: Yolanda Baker MRN: [id number] DOB/AGE: [birth year] 80 y.o.  Admit date: 10/03/2013 Discharge date: 10/07/2013  Primary Diagnosis:  Failure fixation of right proximal femur fracture.  Admission Diagnoses:  Past Medical History  Diagnosis Date  . Osteoporosis   . Hypertension   . Arthritis     neck  . Depression   . Anxiety   . Dysrhythmia     hx A-FLUTTER after hip surg April 2015- take metoprolol  . Shortness of breath     with activity  . Stroke     "mini stroke" per MRI - no deficiets   . Hip joint pain     RT  . Bruises easily   . Eczema    Discharge Diagnoses:   Principal Problem:   Closed fracture of femur with nonunion Active Problems:   Femur fracture, right   Closed femur fracture   Postoperative anemia due to acute blood loss   Postop Transfusion  Estimated body mass index is 18.83 kg/(m^2) as calculated from the following:   Height as of this encounter: 5' 2" (1.575 m).   Weight as of this encounter: 46.72 kg (103 lb).  Procedure(s) (LRB): CONVERSION OF PREVIOUS RIGHT HIP SURGERY TO A RIGHT TOTAL HIP ARTHROPLASTY (Right)   Consults: None  HPI: Ms. Azar is an 80 year old female, who underwent a  right femoral intramedullary nailing for  intertrochanteric/subtrochanteric femur fracture approximately 3 months  ago. She was doing well initially and over the past several weeks has  had increased pain. Radiographs have shown migration of the hardware  and she presents now for conversion to a hemi versus total hip  arthroplasty.  Laboratory Data: Admission on 10/03/2013  Component Date Value Ref Range Status  . WBC 10/04/2013 9.0  4.0 - 10.5 K/uL Final  . RBC 10/04/2013 1.87* 3.87 - 5.11 MIL/uL Final  . Hemoglobin 10/04/2013 6.1* 12.0 - 15.0 g/dL Final   Comment: DELTA CHECK NOTED                          REPEATED TO VERIFY                          CRITICAL RESULT CALLED TO, READ BACK BY AND VERIFIED WITH:                     CRUTCHFIELD,J/6E _0  ON 10/04/13 BY KARCZEWSKI,S.  . HCT 10/04/2013 17.7* 36.0 - 46.0 % Final  . MCV 10/04/2013 94.7  78.0 - 100.0 fL Final  . MCH 10/04/2013 32.6  26.0 - 34.0 pg Final  . MCHC 10/04/2013 34.5  30.0 - 36.0 g/dL Final  . RDW 10/04/2013 14.1  11.5 - 15.5 % Final  . Platelets 10/04/2013 209  150 - 400 K/uL Final   Comment: DELTA CHECK NOTED                          REPEATED TO VERIFY                          SPECIMEN CHECKED FOR CLOTS  . Sodium 10/04/2013 132* 137 - 147 mEq/L Final  . Potassium 10/04/2013 4.7  3.7 - 5.3 mEq/L Final  . Chloride 10/04/2013 98  96 - 112 mEq/L Final  . CO2 10/04/2013 25  19 - 32 mEq/L Final  .  Glucose, Bld 10/04/2013 135* 70 - 99 mg/dL Final  . BUN 10/04/2013 14  6 - 23 mg/dL Final  . Creatinine, Ser 10/04/2013 0.79  0.50 - 1.10 mg/dL Final  . Calcium 10/04/2013 7.2* 8.4 - 10.5 mg/dL Final  . GFR calc non Af Amer 10/04/2013 72* >90 mL/min Final  . GFR calc Af Amer 10/04/2013 84* >90 mL/min Final   Comment: (NOTE)                          The eGFR has been calculated using the CKD EPI equation.                          This calculation has not been validated in all clinical situations.                          eGFR's persistently <90 mL/min signify possible Chronic Kidney                          Disease.  . Anion gap 10/04/2013 9  5 - 15 Final  . Order Confirmation 10/04/2013 ORDER PROCESSED BY BLOOD BANK   Final  . WBC 10/04/2013 11.7* 4.0 - 10.5 K/uL Final  . RBC 10/04/2013 2.96* 3.87 - 5.11 MIL/uL Final  . Hemoglobin 10/04/2013 9.0* 12.0 - 15.0 g/dL Final   Comment: DELTA CHECK NOTED                          REPEATED TO VERIFY                          POST TRANSFUSION SPECIMEN  . HCT 10/04/2013 26.7* 36.0 - 46.0 % Final  . MCV 10/04/2013 90.2  78.0 - 100.0 fL Final  . MCH 10/04/2013 30.4  26.0 - 34.0 pg Final  . MCHC 10/04/2013 33.7  30.0 - 36.0 g/dL Final  . RDW 10/04/2013 15.8* 11.5 - 15.5 % Final  . Platelets  10/04/2013 240  150 - 400 K/uL Final  . WBC 10/05/2013 11.8* 4.0 - 10.5 K/uL Final  . RBC 10/05/2013 2.87* 3.87 - 5.11 MIL/uL Final  . Hemoglobin 10/05/2013 8.9* 12.0 - 15.0 g/dL Final  . HCT 10/05/2013 26.2* 36.0 - 46.0 % Final  . MCV 10/05/2013 91.3  78.0 - 100.0 fL Final  . MCH 10/05/2013 31.0  26.0 - 34.0 pg Final  . MCHC 10/05/2013 34.0  30.0 - 36.0 g/dL Final  . RDW 10/05/2013 16.2* 11.5 - 15.5 % Final  . Platelets 10/05/2013 248  150 - 400 K/uL Final  . Sodium 10/05/2013 139  137 - 147 mEq/L Final   Comment: DELTA CHECK NOTED                          REPEATED TO VERIFY  . Potassium 10/05/2013 3.8  3.7 - 5.3 mEq/L Final   Comment: DELTA CHECK NOTED                          REPEATED TO VERIFY  . Chloride 10/05/2013 101  96 - 112 mEq/L Final  . CO2 10/05/2013 28  19 - 32 mEq/L Final  . Glucose, Bld 10/05/2013 102* 70 - 99 mg/dL Final  . BUN 10/05/2013  13  6 - 23 mg/dL Final  . Creatinine, Ser 10/05/2013 0.73  0.50 - 1.10 mg/dL Final  . Calcium 10/05/2013 7.7* 8.4 - 10.5 mg/dL Final  . GFR calc non Af Amer 10/05/2013 74* >90 mL/min Final  . GFR calc Af Amer 10/05/2013 86* >90 mL/min Final   Comment: (NOTE)                          The eGFR has been calculated using the CKD EPI equation.                          This calculation has not been validated in all clinical situations.                          eGFR's persistently <90 mL/min signify possible Chronic Kidney                          Disease.  . Anion gap 10/05/2013 10  5 - 15 Final  . WBC 10/06/2013 12.0* 4.0 - 10.5 K/uL Final  . RBC 10/06/2013 2.68* 3.87 - 5.11 MIL/uL Final  . Hemoglobin 10/06/2013 8.2* 12.0 - 15.0 g/dL Final  . HCT 10/06/2013 24.8* 36.0 - 46.0 % Final  . MCV 10/06/2013 92.5  78.0 - 100.0 fL Final  . MCH 10/06/2013 30.6  26.0 - 34.0 pg Final  . MCHC 10/06/2013 33.1  30.0 - 36.0 g/dL Final  . RDW 10/06/2013 16.0* 11.5 - 15.5 % Final  . Platelets 10/06/2013 255  150 - 400 K/uL Final  . Sodium 10/07/2013  133* 137 - 147 mEq/L Final  . Potassium 10/07/2013 4.3  3.7 - 5.3 mEq/L Final  . Chloride 10/07/2013 97  96 - 112 mEq/L Final  . CO2 10/07/2013 26  19 - 32 mEq/L Final  . Glucose, Bld 10/07/2013 109* 70 - 99 mg/dL Final  . BUN 10/07/2013 14  6 - 23 mg/dL Final  . Creatinine, Ser 10/07/2013 0.68  0.50 - 1.10 mg/dL Final  . Calcium 10/07/2013 8.1* 8.4 - 10.5 mg/dL Final  . GFR calc non Af Amer 10/07/2013 76* >90 mL/min Final  . GFR calc Af Amer 10/07/2013 88* >90 mL/min Final   Comment: (NOTE)                          The eGFR has been calculated using the CKD EPI equation.                          This calculation has not been validated in all clinical situations.                          eGFR's persistently <90 mL/min signify possible Chronic Kidney                          Disease.  . Anion gap 10/07/2013 10  5 - 15 Final  . WBC 10/07/2013 13.4* 4.0 - 10.5 K/uL Final  . RBC 10/07/2013 3.22* 3.87 - 5.11 MIL/uL Final  . Hemoglobin 10/07/2013 10.0* 12.0 - 15.0 g/dL Final   Comment: DELTA CHECK NOTED  REPEATED TO VERIFY  . HCT 10/07/2013 30.0* 36.0 - 46.0 % Final  . MCV 10/07/2013 93.2  78.0 - 100.0 fL Final  . MCH 10/07/2013 31.1  26.0 - 34.0 pg Final  . MCHC 10/07/2013 33.3  30.0 - 36.0 g/dL Final  . RDW 10/07/2013 15.9* 11.5 - 15.5 % Final  . Platelets 10/07/2013 404* 150 - 400 K/uL Final   Comment: DELTA CHECK NOTED                          REPEATED TO VERIFY                          SPECIMEN CHECKED [facility] Outpatient Visit on 10/02/2013  Component Date Value Ref Range Status  . aPTT 10/02/2013 33  24 - 37 seconds Final  . WBC 10/02/2013 7.6  4.0 - 10.5 K/uL Final  . RBC 10/02/2013 3.80* 3.87 - 5.11 MIL/uL Final  . Hemoglobin 10/02/2013 11.8* 12.0 - 15.0 g/dL Final  . HCT 10/02/2013 35.9* 36.0 - 46.0 % Final  . MCV 10/02/2013 94.5  78.0 - 100.0 fL Final  . MCH 10/02/2013 31.1  26.0 - 34.0 pg Final  . MCHC 10/02/2013 32.9  30.0 - 36.0 g/dL  Final  . RDW 10/02/2013 14.0  11.5 - 15.5 % Final  . Platelets 10/02/2013 399  150 - 400 K/uL Final  . Sodium 10/02/2013 129* 137 - 147 mEq/L Final  . Potassium 10/02/2013 4.6  3.7 - 5.3 mEq/L Final  . Chloride 10/02/2013 93* 96 - 112 mEq/L Final  . CO2 10/02/2013 26  19 - 32 mEq/L Final  . Glucose, Bld 10/02/2013 97  70 - 99 mg/dL Final  . BUN 10/02/2013 10  6 - 23 mg/dL Final  . Creatinine, Ser 10/02/2013 0.62  0.50 - 1.10 mg/dL Final  . Calcium 10/02/2013 8.8  8.4 - 10.5 mg/dL Final  . Total Protein 10/02/2013 7.5  6.0 - 8.3 g/dL Final  . Albumin 10/02/2013 3.2* 3.5 - 5.2 g/dL Final  . AST 10/02/2013 24  0 - 37 U/L Final  . ALT 10/02/2013 11  0 - 35 U/L Final  . Alkaline Phosphatase 10/02/2013 114  39 - 117 U/L Final  . Total Bilirubin 10/02/2013 0.3  0.3 - 1.2 mg/dL Final  . GFR calc non Af Amer 10/02/2013 78* >90 mL/min Final  . GFR calc Af Amer 10/02/2013 >90  >90 mL/min Final   Comment: (NOTE)                          The eGFR has been calculated using the CKD EPI equation.                          This calculation has not been validated in all clinical situations.                          eGFR's persistently <90 mL/min signify possible Chronic Kidney                          Disease.  . Anion gap 10/02/2013 10  5 - 15 Final  . Prothrombin Time 10/02/2013 15.0  11.6 - 15.2 seconds Final  . INR 10/02/2013 1.18  0.00 - 1.49 Final  . Color, Urine  10/02/2013 YELLOW  YELLOW Final  . APPearance 10/02/2013 CLEAR  CLEAR Final  . Specific Gravity, Urine 10/02/2013 1.013  1.005 - 1.030 Final  . pH 10/02/2013 8.0  5.0 - 8.0 Final  . Glucose, UA 10/02/2013 NEGATIVE  NEGATIVE mg/dL Final  . Hgb urine dipstick 10/02/2013 NEGATIVE  NEGATIVE Final  . Bilirubin Urine 10/02/2013 NEGATIVE  NEGATIVE Final  . Ketones, ur 10/02/2013 NEGATIVE  NEGATIVE mg/dL Final  . Protein, ur 10/02/2013 NEGATIVE  NEGATIVE mg/dL Final  . Urobilinogen, UA 10/02/2013 1.0  0.0 - 1.0 mg/dL Final  . Nitrite  10/02/2013 NEGATIVE  NEGATIVE Final  . Leukocytes, UA 10/02/2013 NEGATIVE  NEGATIVE Final   MICROSCOPIC NOT DONE ON URINES WITH NEGATIVE PROTEIN, BLOOD, LEUKOCYTES, NITRITE, OR GLUCOSE <1000 mg/dL.  Marland Kitchen MRSA, PCR 10/02/2013 NEGATIVE  NEGATIVE Final  . Staphylococcus aureus 10/02/2013 NEGATIVE  NEGATIVE Final   Comment:                                 The Xpert SA Assay (FDA                          approved for NASAL specimens                          in patients over 20 years of age),                          is one component of                          a comprehensive surveillance                          program.  Test performance has                          been validated by [employer] for patients greater                          than or equal to 4 year old.                          It is not intended                          to diagnose infection nor to                          guide or monitor treatment.  . ABO/RH(D) 10/02/2013 O POS   Final  . Antibody Screen 10/02/2013 NEG   Final  . Sample Expiration 10/02/2013 10/16/2013   Final  . Unit Number 10/02/2013 [id number]   Final  . Blood Component Type 10/02/2013 RED CELLS,LR   Final  . Unit division 10/02/2013 00   Final  . Status of Unit 10/02/2013 ISSUED,FINAL   Final  . Transfusion Status 10/02/2013 OK TO TRANSFUSE   Final  . Crossmatch Result 10/02/2013 Compatible  Final  . Unit Number 10/02/2013 [id number]   Final  . Blood Component Type 10/02/2013 RBC LR PHER2   Final  . Unit division 10/02/2013 00   Final  . Status of Unit 10/02/2013 ISSUED,FINAL   Final  . Transfusion Status 10/02/2013 OK TO TRANSFUSE   Final  . Crossmatch Result 10/02/2013 Compatible   Final     X-Rays:Dg Hip Complete Right  10/02/2013   CLINICAL DATA:  Right hip pain  EXAM: RIGHT HIP - COMPLETE 2+ VIEW  COMPARISON:  06/05/2013  FINDINGS: A left hip replacement is noted. Postsurgical changes in the proximal right  femur are seen. The medullary rod is stable however the compression screw has been displaced laterally. Significant fragmentation of the proximal right femur is noted consistent with Re fracture and failure of the previous fixation. The pelvic ring remains intact.  IMPRESSION: Failure of prior fixation with displacement of the compression screw as well as increased fragmentation of the proximal right femur with callus formation.   Electronically Signed   By: Inez Catalina M.D.   On: 10/02/2013 11:25   Dg Pelvis Portable  10/03/2013   CLINICAL DATA:  Status post right hip arthroplasty.  EXAM: PORTABLE PELVIS 1-2 VIEWS  COMPARISON:  10/02/2013  FINDINGS: Right hip arthroplasty now present with removal of the gamma nail and proximal intramedullary rod. There is some protrusion of the acetabular component medially. The medial superior screw extends just beyond the roof of the acetabulum.  IMPRESSION: Some protrusion of the acetabular cup present post arthroplasty. Superior and medial screw extends just beyond the roof of the acetabulum.   Electronically Signed   By: Aletta Edouard M.D.   On: 10/03/2013 10:28    EKG: Orders placed during the hospital encounter of 06/04/13  . ED EKG  . EKG 12-LEAD  . EKG 12-LEAD  . EKG  . EKG 12-LEAD  . EKG 12-LEAD  . EKG 12-LEAD  . EKG 12-LEAD     Hospital Course: Patient was admitted to [facility] and taken to the OR and underwent the above state procedure without complications.  Patient tolerated the procedure well and was later transferred to the recovery room and then to the orthopaedic floor for postoperative care.  They were given PO and IV analgesics for pain control following their surgery.  They were given 24 hours of postoperative antibiotics of  Anti-infectives   Start     Dose/Rate Route Frequency Ordered Stop   10/03/13 1400  ceFAZolin (ANCEF) IVPB 2 g/50 mL premix     2 g 100 mL/hr over 30 Minutes Intravenous Every 6 hours 10/03/13 1122  10/03/13 2110   10/03/13 0646  ceFAZolin (ANCEF) IVPB 2 g/50 mL premix  Status:  Discontinued     2 g 100 mL/hr over 30 Minutes Intravenous On call to O.R. 10/03/13 7588 10/03/13 0647   10/03/13 0646  ceFAZolin (ANCEF) IVPB 2 g/50 mL premix     2 g 100 mL/hr over 30 Minutes Intravenous On call to O.R. 10/03/13 3254 10/03/13 0741     and started on DVT prophylaxis in the form of Lovenox.   PT and OT were ordered for total hip protocol.  The patient was allowed to be WBAT with therapy. Discharge planning was consulted to help with postop disposition and equipment needs.  Patient had a tough night on the evening of surgery but doing better the next morning.  They started to get up OOB with therapy on day one. HGB was low  so she was given blood transfusion.  Hemovac drain was pulled without difficulty.  The knee immobilizer was removed and discontinued.  Continued to work with therapy into day two and the foley was discontinued.  Dressing was changed on day two and the incision was healing well.  By day three, her HGB was noted to be down some and drainage noted from the wound. Observed another day and maybe to SNF the next day.  The patient was seen on POD 4 and was doing better.  Incision was healing well.  Patient was seen in rounds and was ready to go the the SNF  Discharge to SNF  Diet - Cardiac diet  Follow up - in 2 weeks on August 25th  Activity - WBAT  Disposition - Skilled nursing facility  Condition Upon Discharge - Stable  D/C Meds - See DC Summary  DVT Prophylaxis - Lovenox for a total of 10 days and then resume her Aspirin 81 mg daily Precautions: No bending hip over 90 degrees- A "L" Angle Do not cross legs Do not let foot roll inward When turning these patients a pillow should be placed between the patient's legs to prevent crossing. Patients should have the affected knee fully extended when trying to sit or stand from all surfaces to prevent excessive hip flexion. When ambulating  and turning toward the affected side the affected leg should have the toes turned out prior to moving the walker and the rest of patient's body as to prevent internal rotation/ turning in of the leg. Abduction pillows are the most effective [place] to prevent a patient from not crossing legs or turning toes in at rest. If an abduction pillow is not ordered placing a regular pillow length wise between the patient's legs is also an effective reminder. It is imperative that these precautions be maintained so that the surgical hip does not dislocate.    Discharge Instructions   Call MD / Call 911    Complete by:  As directed   If you experience chest pain or shortness of breath, CALL 911 and be transported to the hospital emergency room.  If you develope a fever above 101 F, pus (white drainage) or increased drainage or redness at the wound, or calf pain, call your surgeon's office.     Change dressing    Complete by:  As directed   You may change your dressing dressing daily with sterile 4 x 4 inch gauze dressing and paper tape.  Do not submerge the incision under water.     Constipation Prevention    Complete by:  As directed   Drink plenty of fluids.  Prune juice may be helpful.  You may use a stool softener, such as Colace (over the counter) 100 mg twice a day.  Use MiraLax (over the counter) for constipation as needed.     Diet - low sodium heart healthy    Complete by:  As directed      Discharge instructions    Complete by:  As directed   Pick up stool softner and laxative for home. Do not submerge incision under water. May shower. Continue to use ice for pain and swelling from surgery. Hip precautions.  Total Hip Protocol.  Lovenox Injections for one more week and then resume baby Aspirin 81 mg daily.  When discharged from the skilled rehab facility, please have the facility set up the patient's [place] prior to being released.  Also provide the patient with  their  medications at time of release from the facility to include their pain medication, the muscle relaxants, and their blood thinner medication.  If the patient is still at the rehab facility at time of follow up appointment, please also assist the patient in arranging follow up appointment in our office and any transportation needs.     Do not sit on low chairs, stoools or toilet seats, as it may be difficult to get up from low surfaces    Complete by:  As directed      Driving restrictions    Complete by:  As directed   No driving until released by the physician.     Follow the hip precautions as taught in Physical Therapy    Complete by:  As directed      Increase activity slowly as tolerated    Complete by:  As directed      Lifting restrictions    Complete by:  As directed   No lifting until released by the physician.     Patient may shower    Complete by:  As directed   You may shower without a dressing once there is no drainage.  Do not wash over the wound.  If drainage remains, do not shower until drainage stops.     TED hose    Complete by:  As directed   Use stockings (TED hose) for 3 weeks on both leg(s).  You may remove them at night for sleeping.     Weight bearing as tolerated    Complete by:  As directed             Medication List    STOP taking these medications       aspirin EC 81 MG tablet     CALCIUM MAGNESIUM PO     denosumab 60 MG/ML Soln injection  Commonly known as:  PROLIA     GOLD BOND ULTRA ECZEMA RELIEF EX     multivitamin with minerals Tabs tablet     vitamin E 400 UNIT capsule      TAKE these medications       acetaminophen 500 MG chewable tablet  Commonly known as:  TYLENOL  Chew 1,000 mg by mouth every 6 (six) hours as needed for pain.     amLODipine 5 MG tablet  Commonly known as:  NORVASC  Take 5 mg by mouth every morning.     antiseptic oral rinse Liqd  15 mLs by Mouth Rinse route 2 (two) times daily as needed for dry mouth.      bisacodyl 5 MG EC tablet  Commonly known as:  DULCOLAX  Take 5 mg by mouth daily as needed for moderate constipation.     citalopram 40 MG tablet  Commonly known as:  CELEXA  Take 40 mg by mouth every morning.     docusate sodium 100 MG capsule  Commonly known as:  COLACE  Take 100 mg by mouth at bedtime.     enoxaparin 40 MG/0.4ML injection  Commonly known as:  LOVENOX  Inject 0.4 mLs (40 mg total) into the skin daily. Continue injections for one more week and then resume daily baby Aspirin 81 mg.     galantamine 8 MG 24 hr capsule  Commonly known as:  RAZADYNE ER  Take 8 mg by mouth daily with breakfast.     HYDROcodone-acetaminophen 5-325 MG per tablet  Commonly known as:  NORCO/VICODIN  Take 1-2 tablets by mouth every 6 (  six) hours as needed for moderate pain.     iron polysaccharides 150 MG capsule  Commonly known as:  NIFEREX  Take 1 capsule (150 mg total) by mouth 2 (two) times daily. Take for three more weeks     LORazepam 0.5 MG tablet  Commonly known as:  ATIVAN  Take 0.5 mg by mouth 2 (two) times daily.     methocarbamol 500 MG tablet  Commonly known as:  ROBAXIN  Take 1 tablet (500 mg total) by mouth every 6 (six) hours as needed for muscle spasms.     metoprolol tartrate 25 MG tablet  Commonly known as:  LOPRESSOR  Take 25-50 mg by mouth 2 (two) times daily. 2 in the morning and 1 at night     ondansetron 4 MG tablet  Commonly known as:  ZOFRAN  Take 1 tablet (4 mg total) by mouth every 6 (six) hours as needed for nausea.     polyethylene glycol packet  Commonly known as:  MIRALAX / GLYCOLAX  Take 17 g by mouth daily as needed for mild constipation.     polyvinyl alcohol 1.4 % ophthalmic solution  Commonly known as:  LIQUIFILM TEARS  Place 1 drop into both eyes 3 (three) times daily as needed for dry eyes.     potassium chloride SA 20 MEQ tablet  Commonly known as:  K-DUR,KLOR-CON  Take 20 mEq by mouth every other day.     psyllium 58.6 % powder    Commonly known as:  METAMUCIL  Take 1 packet by mouth 2 (two) times daily as needed (Constipation). Pt takes 1 tablespoon at each dose     traMADol 50 MG tablet  Commonly known as:  ULTRAM  Take 1-2 tablets (50-100 mg total) by mouth every 6 (six) hours as needed (mild pain).     ziprasidone 80 MG capsule  Commonly known as:  GEODON  Take 80 mg by mouth at bedtime.           Follow-up Information   Follow up with Gearlean Alf, MD On 10/20/2013. (Call office at [phone] for appoitment on August 25th)    Specialty:  Orthopedic Surgery   Contact information:   [address] [place] [address] [phone]       Signed: Arlee Muslim, PA-C Orthopaedic Surgery 10/07/2013, 7:40 AM

## 2013-10-07 NOTE — Discharge Instructions (Signed)
Dr. Gaynelle Arabian Total Joint Specialist [facility] [address], [place], [place] [address] [phone]   TOTAL HIP REPLACEMENT POSTOPERATIVE DIRECTIONS    Hip Rehabilitation, Guidelines Following Surgery  The results of a hip operation are greatly improved after range of motion and muscle strengthening exercises. Follow all safety measures which are given to protect your hip. If any of these exercises cause increased pain or swelling in your joint, decrease the amount until you are comfortable again. Then slowly increase the exercises. Call your caregiver if you have problems or questions.  HOME CARE INSTRUCTIONS  Most of the following instructions are designed to prevent the dislocation of your new hip.  Remove items at home which could result in a fall. This includes throw rugs or furniture in walking pathways.  Continue medications as instructed at time of discharge.  You may have some home medications which will be placed on hold until you complete the course of blood thinner medication.  You may start showering once you are discharged home but do not submerge the incision under water. Just pat the incision dry and apply a dry gauze dressing on daily. Do not put on socks or shoes without following the instructions of your caregivers.  Sit on high chairs so your hips are not bent more than 90 degrees.  Sit on chairs with arms. Use the chair arms to help push yourself up when arising.  Keep your leg on the side of the operation out in front of you when standing up.  Arrange for the use of a toilet seat elevator so you are not sitting low.  Do not do any exercises or get in any positions that cause your toes to point in (pigeon toed).  Always sleep with a pillow between your legs. Do not lie on your side in sleep with both knees touching the bed.   Walk with walker as instructed.  You may resume a sexual relationship in one month or when given the OK by  your caregiver.  Use walker as long as suggested by your caregivers.  You may put full weight on your legs and walk as much as is comfortable. Avoid periods of inactivity such as sitting longer than an hour when not asleep. This helps prevent blood clots.  You may return to work once you are cleared by Engineer, production.  Do not drive a car for 6 weeks or until released by your surgeon.  Do not drive while taking narcotics.  Wear elastic stockings for three weeks following surgery during the day but you may remove then at night.  Make sure you keep all of your appointments after your operation with all of your doctors and caregivers. You should call the office at the above phone number and make an appointment for approximately two weeks after the date of your surgery. Change the dressing daily and reapply a dry dressing each time. Please pick up a stool softener and laxative for home use as long as you are requiring pain medications.  Continue to use ice on the hip for pain and swelling from surgery. You may notice swelling that will progress down to the foot and ankle.  This is normal after  surgery.  Elevate the leg when you are not up walking on it.   It is important for you to complete the blood thinner medication as prescribed by your doctor.  Continue to use the breathing machine which will help keep your temperature down.  It  is common for your temperature to cycle up and down following surgery, especially at night when you are not up moving around and exerting yourself.  The breathing machine keeps your lungs expanded and your temperature down.  RANGE OF MOTION AND STRENGTHENING EXERCISES  These exercises are designed to help you keep full movement of your hip joint. Follow your caregiver's or physical therapist's instructions. Perform all exercises about fifteen times, three times per day or as directed. Exercise both hips, even if you have had only one joint replacement. These exercises can be  done on a training (exercise) mat, on the floor, on a table or on a bed. Use whatever works the best and is most comfortable for you. Use music or television while you are exercising so that the exercises are a pleasant break in your day. This will make your life better with the exercises acting as a break in routine you can look forward to.  Lying on your back, slowly slide your foot toward your buttocks, raising your knee up off the floor. Then slowly slide your foot back down until your leg is straight again.  Lying on your back spread your legs as far apart as you can without causing discomfort.  Lying on your side, raise your upper leg and foot straight up from the floor as far as is comfortable. Slowly lower the leg and repeat.  Lying on your back, tighten up the muscle in the front of your thigh (quadriceps muscles). You can do this by keeping your leg straight and trying to raise your heel off the floor. This helps strengthen the largest muscle supporting your knee.  Lying on your back, tighten up the muscles of your buttocks both with the legs straight and with the knee bent at a comfortable angle while keeping your heel on the floor.   SKILLED REHAB INSTRUCTIONS: If the patient is transferred to a skilled rehab facility following release from the hospital, a list of the current medications will be sent to the facility for the patient to continue.  When discharged from the skilled rehab facility, please have the facility set up the patient's Home Health Physical Therapy prior to being released. Also, the skilled facility will be responsible for providing the patient with their medications at time of release from the facility to include their pain medication, the muscle relaxants, and their blood thinner medication. If the patient is still at the rehab facility at time of the two week follow up appointment, the skilled rehab facility will also need to assist the patient in arranging follow up  appointment in our office and any transportation needs.  MAKE SURE YOU:  Understand these instructions.  Will watch your condition.  Will get help right away if you are not doing well or get worse.  Pick up stool softner and laxative for home. Do not submerge incision under water. May shower. Continue to use ice for pain and swelling from surgery. Hip precautions.  Total Hip Protocol.  Lovenox Injections for one more week and then resume baby Aspirin 81 mg daily.  When discharged from the skilled rehab facility, please have the facility set up the patient's Home Health Physical Therapy prior to being released.  Also provide the patient with their medications at time of release from the facility to include their pain medication, the muscle relaxants, and their blood thinner medication.  If the patient is still at the rehab facility at time of follow up appointment, please also assist the  patient in arranging follow up appointment in our office and any transportation needs.

## 2013-10-08 ENCOUNTER — Encounter: Payer: Self-pay | Admitting: Adult Health

## 2013-10-08 ENCOUNTER — Non-Acute Institutional Stay (SKILLED_NURSING_FACILITY): Payer: Medicare Other | Admitting: Adult Health

## 2013-10-08 ENCOUNTER — Other Ambulatory Visit: Payer: Self-pay | Admitting: *Deleted

## 2013-10-08 DIAGNOSIS — E876 Hypokalemia: Secondary | ICD-10-CM

## 2013-10-08 DIAGNOSIS — F3289 Other specified depressive episodes: Secondary | ICD-10-CM

## 2013-10-08 DIAGNOSIS — I10 Essential (primary) hypertension: Secondary | ICD-10-CM

## 2013-10-08 DIAGNOSIS — D62 Acute posthemorrhagic anemia: Secondary | ICD-10-CM

## 2013-10-08 DIAGNOSIS — S7291XK Unspecified fracture of right femur, subsequent encounter for closed fracture with nonunion: Secondary | ICD-10-CM

## 2013-10-08 DIAGNOSIS — F329 Major depressive disorder, single episode, unspecified: Secondary | ICD-10-CM

## 2013-10-08 DIAGNOSIS — IMO0002 Reserved for concepts with insufficient information to code with codable children: Secondary | ICD-10-CM

## 2013-10-08 DIAGNOSIS — F03918 Unspecified dementia, unspecified severity, with other behavioral disturbance: Secondary | ICD-10-CM

## 2013-10-08 DIAGNOSIS — F411 Generalized anxiety disorder: Secondary | ICD-10-CM

## 2013-10-08 DIAGNOSIS — F32A Depression, unspecified: Secondary | ICD-10-CM

## 2013-10-08 DIAGNOSIS — F0391 Unspecified dementia with behavioral disturbance: Secondary | ICD-10-CM

## 2013-10-08 DIAGNOSIS — F419 Anxiety disorder, unspecified: Secondary | ICD-10-CM

## 2013-10-08 MED ORDER — HYDROCODONE-ACETAMINOPHEN 5-325 MG PO TABS: 1.0000 | ORAL_TABLET | Freq: Four times a day (QID) | ORAL | Status: DC | PRN

## 2013-10-12 ENCOUNTER — Encounter: Payer: Self-pay | Admitting: Adult Health

## 2013-10-12 DIAGNOSIS — D62 Acute posthemorrhagic anemia: Secondary | ICD-10-CM | POA: Insufficient documentation

## 2013-10-12 NOTE — Progress Notes (Signed)
Patient ID: Yolanda Baker, female   DOB: 02-Jan-1924, 80 y.o.   MRN: [id number]              PROGRESS NOTE  DATE: 10/08/2013  FACILITY: Nursing Home Location: [facility]  LEVEL OF CARE: SNF (31)  Acute Visit  CHIEF COMPLAINT:   Follow-up Hospitalization  HISTORY OF PRESENT ILLNESS: This is an 80 year old female who has been [facility] 10[facility]13 [facility] with Failure fixation of right proximal femur fracture S/P Right total hip arthroplasty. She has been admitted for a short-term rehabilitation.  REASSESSMENT OF ONGOING PROBLEM(S):  HTN: Pt 's HTN remains stable.  Denies CP, sob, DOE, pedal edema, headaches, dizziness or visual disturbances.  No complications from the medications currently being used.  Last BP : 140/64  ANEMIA: The anemia has been stable. The patient denies fatigue, melena or hematochezia. No complications from the medications currently being used. 8/15 hgb 10.0  DEPRESSION: The depression remains stable. Patient denies ongoing feelings of sadness, insomnia, anedhonia or lack of appetite. No complications reported from the medications currently being used. Staff do not report behavioral problems.   PAST MEDICAL HISTORY : Reviewed.  No changes/see problem list  CURRENT MEDICATIONS: Reviewed per MAR/see medication list  REVIEW OF SYSTEMS:  GENERAL: no change in appetite, no fatigue, no weight changes, no fever, chills or weakness RESPIRATORY: no cough, SOB, DOE, wheezing, hemoptysis CARDIAC: no chest pain, or palpitations, + edema GI: no abdominal pain, diarrhea, constipation, heart burn, nausea or vomiting  PHYSICAL EXAMINATION  GENERAL: no acute distress, normal body habitus EYES: conjunctivae normal, sclerae normal, normal eye lids NECK: supple, trachea midline, no neck masses, no thyroid tenderness, no thyromegaly LYMPHATICS: no LAN in the neck, no supraclavicular LAN RESPIRATORY: breathing is even &  unlabored, BS CTAB CARDIAC: RRR, no murmur,no extra heart sounds, RLE edema 2+ GI: abdomen soft, normal BS, no masses, no tenderness, no hepatomegaly, no splenomegaly EXTREMITIES:  Able to move all 4 extremities; limited ROM on RLE PSYCHIATRIC: the patient is alert & oriented to person, affect & behavior appropriate  LABS/RADIOLOGY: Labs reviewed: Basic Metabolic Panel:  Recent Labs  70/62/3706/11/11 0410 10/05/13 0447 10/07/13 0527  NA 132* 139 133*  K 4.7 3.8 4.3  CL 98 101 97  CO2 25 28 26   GLUCOSE 135* 102* 109*  BUN 14 13 14   CREATININE 0.79 0.73 0.68  CALCIUM 7.2* 7.7* 8.1*   Liver Function Tests:  Recent Labs  10/02/13 1010  AST 24  ALT 11  ALKPHOS 114  BILITOT 0.3  PROT 7.5  ALBUMIN 3.2*   CBC:  Recent Labs  06/04/13 1320  10/05/13 0447 10/06/13 0415 10/07/13 0527  WBC 6.5  < > 11.8* 12.0* 13.4*  NEUTROABS 4.8  --   --   --   --   HGB 11.6*  < > 8.9* 8.2* 10.0*  HCT 33.8*  < > 26.2* 24.8* 30.0*  MCV 92.9  < > 91.3 92.5 93.2  PLT 267  < > 248 255 404*  < > = values in this interval not displayed.   EXAM: RIGHT HIP - COMPLETE 2+ VIEW   COMPARISON:  06/05/2013   FINDINGS: A left hip replacement is noted. Postsurgical changes in the proximal right femur are seen. The medullary rod is stable however the compression screw has been displaced laterally. Significant fragmentation of the proximal right femur is noted consistent with Re fracture and failure of the previous fixation. The  pelvic ring remains intact.   IMPRESSION: Failure of prior fixation with displacement of the compression screw as well as increased fragmentation of the proximal right femur with callus formation. EXAM: PORTABLE PELVIS 1-2 VIEWS   COMPARISON:  10/02/2013   FINDINGS: Right hip arthroplasty now present with removal of the gamma nail and proximal intramedullary rod. There is some protrusion of the acetabular component medially. The medial superior screw extends just  beyond the roof of the acetabulum.   IMPRESSION: Some protrusion of the acetabular cup present post arthroplasty. Superior and medial screw extends just beyond the roof of the acetabulum.   ASSESSMENT/PLAN:  Closed fracture of femur with nonunion S/P Right total hip arthroplasty - for rehabilitation Hypertension - continue Norvasc and Lopressor Depression - continue cytology from Anxiety - continue Ativan Anemia, acute blood loss - continue Niferex; Check CBC Hypokalemia - continue K supplementation; check bmp Dementia - continue Razadyne and Geodon   CPT CODE: 17616  Ella Bodo - NP [facility] [phone]

## 2013-10-13 ENCOUNTER — Non-Acute Institutional Stay (SKILLED_NURSING_FACILITY): Payer: Medicare Other | Admitting: Internal Medicine

## 2013-10-13 DIAGNOSIS — E876 Hypokalemia: Secondary | ICD-10-CM

## 2013-10-13 DIAGNOSIS — S7291XK Unspecified fracture of right femur, subsequent encounter for closed fracture with nonunion: Secondary | ICD-10-CM

## 2013-10-13 DIAGNOSIS — D62 Acute posthemorrhagic anemia: Secondary | ICD-10-CM

## 2013-10-13 DIAGNOSIS — IMO0002 Reserved for concepts with insufficient information to code with codable children: Secondary | ICD-10-CM

## 2013-10-13 DIAGNOSIS — I10 Essential (primary) hypertension: Secondary | ICD-10-CM

## 2013-10-15 NOTE — Progress Notes (Signed)
HISTORY & PHYSICAL  DATE: 10/13/2013   FACILITY: [facility]  LEVEL OF CARE: SNF (31)  ALLERGIES:  Allergies  Allergen Reactions  . Flagyl [Metronidazole]     Fingers get "numb"    CHIEF COMPLAINT:  Manage right femur fracture, acute blood loss anemia and hypokalemia  HISTORY OF PRESENT ILLNESS: Patient is an 80 year old Caucasian female.  HIP FRACTURE: The patient had a mechanical fall and sustained a femur fracture.  Patient subsequently underwent surgical repair 3 months ago and tolerated the procedure well.  Over  the past several weeks she has been having increased pain. Radiographs showed migration of the hardware and she underwent conversion to a hemi versus total hip arthroplasty. Patient is admitted to this facility for short-term rehabilitation. Patient denies hip pain currently. No complications reported from the pain medications currently being used.  ANEMIA: The anemia has been stable. The patient denies fatigue, melena or hematochezia. No complications from the medications currently being used. Postoperatively the patient suffered an acute blood loss. She did not require transfusion.  HYPOKALEMIA: The patient's hypokalemia remains stable. Patient denies muscle cramping or palpitations. No complications reported from current potassium supplementation.  PAST MEDICAL HISTORY :  Past Medical History  Diagnosis Date  . Osteoporosis   . Hypertension   . Arthritis     neck  . Depression   . Anxiety   . Dysrhythmia     hx A-FLUTTER after hip surg April 2015- take metoprolol  . Shortness of breath     with activity  . Stroke     "mini stroke" per MRI - no deficiets   . Hip joint pain     RT  . Bruises easily   . Eczema     PAST SURGICAL HISTORY: Past Surgical History  Procedure Laterality Date  . Left hip Left 2008  . Abdominal hysterectomy    . Fracture surgery Right 06/05/2013    hip  . Intramedullary (im) nail  intertrochanteric Right 06/05/2013    Procedure: INTRAMEDULLARY (IM) NAIL INTERTROCHANTRIC;  Surgeon: Loanne Drilling, MD;  Location: [facility];  Service: Orthopedics;  Laterality: Right;  . Joint replacement      left hip  . Hip arthroplasty Right 10/03/2013    Procedure: CONVERSION OF PREVIOUS RIGHT HIP SURGERY TO A RIGHT TOTAL HIP ARTHROPLASTY;  Surgeon: Loanne Drilling, MD;  Location: [facility];  Service: Orthopedics;  Laterality: Right;    SOCIAL HISTORY:  reports that she has never smoked. She has never used smokeless tobacco. She reports that she does not drink alcohol or use illicit drugs.  FAMILY HISTORY: None  CURRENT MEDICATIONS: Reviewed per MAR/see medication list  REVIEW OF SYSTEMS:  See HPI otherwise 14 point ROS is negative.  PHYSICAL EXAMINATION  VS:  See VS section  GENERAL: no acute distress, normal body habitus EYES: conjunctivae normal, sclerae normal, normal eye lids MOUTH/THROAT: lips without lesions,no lesions in the mouth,tongue is without lesions,uvula elevates in midline NECK: supple, trachea midline, no neck masses, no thyroid tenderness, no thyromegaly LYMPHATICS: no LAN in the neck, no supraclavicular LAN RESPIRATORY: breathing is even & unlabored, BS CTAB CARDIAC: RRR, no murmur,no extra heart sounds, no edema GI:  ABDOMEN: abdomen soft, normal BS, no masses, no tenderness  LIVER/SPLEEN: no hepatomegaly, no splenomegaly MUSCULOSKELETAL: HEAD: normal to inspection  EXTREMITIES: LEFT UPPER EXTREMITY: full range of motion, normal strength & tone RIGHT UPPER EXTREMITY:  full range of motion, normal strength &  tone LEFT LOWER EXTREMITY: Moderate range of motion, normal strength & tone RIGHT LOWER EXTREMITY: range of motion not tested due to surgery, normal strength & tone PSYCHIATRIC: the patient is alert & oriented to person, affect & behavior appropriate  LABS/RADIOLOGY:  Labs reviewed: Basic Metabolic Panel:  Recent Labs  82/95/6206/11/11 0410 10/05/13 0447  10/07/13 0527  NA 132* 139 133*  K 4.7 3.8 4.3  CL 98 101 97  CO2 25 28 26   GLUCOSE 135* 102* 109*  BUN 14 13 14   CREATININE 0.79 0.73 0.68  CALCIUM 7.2* 7.7* 8.1*   Liver Function Tests:  Recent Labs  10/02/13 1010  AST 24  ALT 11  ALKPHOS 114  BILITOT 0.3  PROT 7.5  ALBUMIN 3.2*   CBC:  Recent Labs  06/04/13 1320  10/05/13 0447 10/06/13 0415 10/07/13 0527  WBC 6.5  < > 11.8* 12.0* 13.4*  NEUTROABS 4.8  --   --   --   --   HGB 11.6*  < > 8.9* 8.2* 10.0*  HCT 33.8*  < > 26.2* 24.8* 30.0*  MCV 92.9  < > 91.3 92.5 93.2  PLT 267  < > 248 255 404*  < > = values in this interval not displayed.   RIGHT HIP - COMPLETE 2+ VIEW   COMPARISON:  06/05/2013   FINDINGS: A left hip replacement is noted. Postsurgical changes in the proximal right femur are seen. The medullary rod is stable however the compression screw has been displaced laterally. Significant fragmentation of the proximal right femur is noted consistent with Re fracture and failure of the previous fixation. The pelvic ring remains intact.   IMPRESSION: Failure of prior fixation with displacement of the compression screw as well as increased fragmentation of the proximal right femur with callus formation. PORTABLE PELVIS 1-2 VIEWS   COMPARISON:  10/02/2013   FINDINGS: Right hip arthroplasty now present with removal of the gamma nail and proximal intramedullary rod. There is some protrusion of the acetabular component medially. The medial superior screw extends just beyond the roof of the acetabulum.   IMPRESSION: Some protrusion of the acetabular cup present post arthroplasty. Superior and medial screw extends just beyond the roof of the acetabulum.   ASSESSMENT/PLAN:  Right femur fracture-status post right total hip arthroplasty. Continue rehabilitation. Acute blood loss anemia-check hemoglobin. Continue iron. Hypokalemia-continue supplementation. Check potassium level Hypertension-blood  pressure elevated. Will monitor. Dementia -- continue razadyne ER Depression -- continue current medications. Check CBC and BMP  I have reviewed patient's medical records received at admission/from hospitalization.  CPT CODE: [id number]  Angela CoxGayani Y Dasanayaka, MD [facility] [phone]

## 2013-10-22 ENCOUNTER — Other Ambulatory Visit: Payer: Self-pay | Admitting: *Deleted

## 2013-10-22 MED ORDER — LORAZEPAM 0.5 MG PO TABS: ORAL_TABLET | ORAL | Status: DC

## 2013-10-22 NOTE — Telephone Encounter (Signed)
[facility] 

## 2013-10-23 ENCOUNTER — Non-Acute Institutional Stay (SKILLED_NURSING_FACILITY): Payer: Medicare Other | Admitting: Adult Health

## 2013-10-23 ENCOUNTER — Encounter: Payer: Self-pay | Admitting: Adult Health

## 2013-10-23 DIAGNOSIS — F329 Major depressive disorder, single episode, unspecified: Secondary | ICD-10-CM

## 2013-10-23 DIAGNOSIS — I10 Essential (primary) hypertension: Secondary | ICD-10-CM

## 2013-10-23 DIAGNOSIS — F32A Depression, unspecified: Secondary | ICD-10-CM

## 2013-10-23 DIAGNOSIS — L03119 Cellulitis of unspecified part of limb: Secondary | ICD-10-CM

## 2013-10-23 DIAGNOSIS — L02419 Cutaneous abscess of limb, unspecified: Secondary | ICD-10-CM

## 2013-10-23 DIAGNOSIS — S72009D Fracture of unspecified part of neck of unspecified femur, subsequent encounter for closed fracture with routine healing: Secondary | ICD-10-CM

## 2013-10-23 DIAGNOSIS — F0391 Unspecified dementia with behavioral disturbance: Secondary | ICD-10-CM

## 2013-10-23 DIAGNOSIS — F419 Anxiety disorder, unspecified: Secondary | ICD-10-CM

## 2013-10-23 DIAGNOSIS — S72001D Fracture of unspecified part of neck of right femur, subsequent encounter for closed fracture with routine healing: Secondary | ICD-10-CM

## 2013-10-23 DIAGNOSIS — F3289 Other specified depressive episodes: Secondary | ICD-10-CM

## 2013-10-23 DIAGNOSIS — F03918 Unspecified dementia, unspecified severity, with other behavioral disturbance: Secondary | ICD-10-CM

## 2013-10-23 DIAGNOSIS — L03115 Cellulitis of right lower limb: Secondary | ICD-10-CM

## 2013-10-23 DIAGNOSIS — D62 Acute posthemorrhagic anemia: Secondary | ICD-10-CM

## 2013-10-23 DIAGNOSIS — E876 Hypokalemia: Secondary | ICD-10-CM

## 2013-10-23 DIAGNOSIS — E871 Hypo-osmolality and hyponatremia: Secondary | ICD-10-CM

## 2013-10-23 DIAGNOSIS — F411 Generalized anxiety disorder: Secondary | ICD-10-CM

## 2013-10-23 NOTE — Progress Notes (Signed)
Patient ID: Yolanda Baker, female   DOB: 02-28-1923, 80 y.o.   MRN: [id number]              PROGRESS NOTE  DATE:  10/23/13  FACILITY: Nursing Home Location: [facility]  LEVEL OF CARE: SNF (31)  Acute Visit  CHIEF COMPLAINT:   Discharge Notes  HISTORY OF PRESENT ILLNESS: This is an 80 year old female who is for discharge home on 11/03/13 with Home health PT, OT, CNA and Nursing. She has been admitted to [facility] on 10/07/13 from [facility] with Failure fixation of right proximal femur fracture S/P Right total hip arthroplasty. Patient was admitted to this facility for short-term rehabilitation after the patient's recent hospitalization.  Patient has completed SNF rehabilitation and therapy has cleared the patient for discharge.  REASSESSMENT OF ONGOING PROBLEM(S):  HTN: Pt 's HTN remains stable.  Denies CP, sob, DOE, headaches, dizziness or visual disturbances.  No complications from the medications currently being used.  Last BP : 126/58  ANEMIA: The anemia has been stable. The patient denies fatigue, melena or hematochezia. No complications from the medications currently being used. 8/15 hgb 8.2  HYPOKALEMIA: The patient's hypokalemia remains stable. Patient denies muscle cramping or palpitations. No complications reported from current potassium supplementation. 8/15 K 4.7   PAST MEDICAL HISTORY : Reviewed.  No changes/see problem list  CURRENT MEDICATIONS: Reviewed per MAR/see medication list  REVIEW OF SYSTEMS:  GENERAL: no change in appetite, no fatigue, no weight changes, no fever, chills or weakness RESPIRATORY: no cough, SOB, DOE, wheezing, hemoptysis CARDIAC: no chest pain, or palpitations, + edema GI: no abdominal pain, diarrhea, constipation, heart burn, nausea or vomiting  PHYSICAL EXAMINATION  GENERAL: no acute distress, normal body habitus SKIN:  Right hip surgical wound has erythema and moderate amount of serous drainage NECK: supple,  trachea midline, no neck masses, no thyroid tenderness, no thyromegaly LYMPHATICS: no LAN in the neck, no supraclavicular LAN RESPIRATORY: breathing is even & unlabored, BS CTAB CARDIAC: RRR, no murmur,no extra heart sounds, RLE edema 2+ GI: abdomen soft, normal BS, no masses, no tenderness, no hepatomegaly, no splenomegaly EXTREMITIES:  Able to move all 4 extremities; limited ROM on RLE due to surgery PSYCHIATRIC: the patient is alert & oriented to person, affect & behavior appropriate  LABS/RADIOLOGY:  10/19/13  WBC 11.2 hemoglobin 8.2 hematocrit 26.3 MCV 96.0 sodium 137 potassium 4.7 glucose 109 BUN 20 creatinine 0.7 calcium 7.5 10/17/13  chest x-ray shows bibasilar inflammation 10/13/13  WBC 10.5 hemoglobin 10.4 hematocrit 34.0 MCV 98.3  sodium 134 potassium 4.2 glucose 98 BUN 11 creatinine 0.6 calcium 8.1 .  Labs reviewed: Basic Metabolic Panel:  Recent Labs  40/98/11 0410 10/05/13 0447 10/07/13 0527  NA 132* 139 133*  K 4.7 3.8 4.3  CL 98 101 97  CO2 GLUCOSE 135* 102* 109*  BUN CREATININE 0.79 0.73 0.68  CALCIUM 7.2* 7.7* 8.1*   Liver Function Tests:  Recent Labs  10/02/13 1010  AST 24  ALT 11  ALKPHOS 114  BILITOT 0.3  PROT 7.5  ALBUMIN 3.2*   CBC:  Recent Labs  06/04/13 1320  10/05/13 0447 10/06/13 0415 10/07/13 0527  WBC 6.5  < > 11.8* 12.0* 13.4*  NEUTROABS 4.8  --   --   --   --   HGB 11.6*  < > 8.9* 8.2* 10.0*  HCT 33.8*  < > 26.2* 24.8* 30.0*  MCV 92.9  < >  91.3 92.5 93.2  PLT 267  < > 248 255 404*  < > = values in this interval not displayed.   EXAM: RIGHT HIP - COMPLETE 2+ VIEW   COMPARISON:  06/05/2013   FINDINGS: A left hip replacement is noted. Postsurgical changes in the proximal right femur are seen. The medullary rod is stable however the compression screw has been displaced laterally. Significant fragmentation of the proximal right femur is noted consistent with Re fracture and failure of the previous  fixation. The pelvic ring remains intact.   IMPRESSION: Failure of prior fixation with displacement of the compression screw as well as increased fragmentation of the proximal right femur with callus formation. EXAM: PORTABLE PELVIS 1-2 VIEWS   COMPARISON:  10/02/2013   FINDINGS: Right hip arthroplasty now present with removal of the gamma nail and proximal intramedullary rod. There is some protrusion of the acetabular component medially. The medial superior screw extends just beyond the roof of the acetabulum.   IMPRESSION: Some protrusion of the acetabular cup present post arthroplasty. Superior and medial screw extends just beyond the roof of the acetabulum.   ASSESSMENT/PLAN:  Closed fracture of femur with nonunion S/P Right total hip arthroplasty - for home health PT, OT, nursing and CNA Cellulitis of right hip - continue doxycycline Hypertension - well controlled; continue Norvasc and Lopressor Depression - continue Citalopram Anxiety - continue Ativan Anemia, acute blood loss - continue Niferex; Check CBC Hypokalemia - continue K supplementation; check bmp Dementia - continue Razadyne and Geodon Hyponatremia - check bmp  I have filled out patient's discharge paperwork and written prescriptions.  Patient will receive home health PT, OT, Nursing and CNA.  Total discharge time: Less than 30 minutes  Discharge time involved coordination of the discharge process with Child psychotherapist, nursing staff and therapy department. Medical justification for home health services verified.   CPT CODE: 97989  Ella Bodo - NP [facility] [phone]

## 2013-10-28 ENCOUNTER — Ambulatory Visit: Payer: Medicare Other

## 2013-12-30 ENCOUNTER — Ambulatory Visit
Admission: RE | Admit: 2013-12-30 | Discharge: 2013-12-30 | Disposition: A | Discharge status: Home / Self Care | Payer: 59 | Source: Ambulatory Visit

## 2013-12-30 DIAGNOSIS — Z1231 Encounter for screening mammogram for malignant neoplasm of breast: Secondary | ICD-10-CM

## 2014-06-29 ENCOUNTER — Emergency Department (HOSPITAL_COMMUNITY): Payer: Medicare Other

## 2014-06-29 ENCOUNTER — Inpatient Hospital Stay (HOSPITAL_COMMUNITY)
Admission: EM | Admit: 2014-06-29 | Discharge: 2014-07-01 | DRG: 065 | Disposition: A | Discharge status: Home Health | Payer: Medicare Other | Attending: Neurology | Admitting: Neurology

## 2014-06-29 ENCOUNTER — Encounter (HOSPITAL_COMMUNITY): Payer: Self-pay | Admitting: Emergency Medicine

## 2014-06-29 DIAGNOSIS — I6340 Cerebral infarction due to embolism of unspecified cerebral artery: Principal | ICD-10-CM | POA: Diagnosis present

## 2014-06-29 DIAGNOSIS — Z79899 Other long term (current) drug therapy: Secondary | ICD-10-CM | POA: Diagnosis not present

## 2014-06-29 DIAGNOSIS — Z681 Body mass index (BMI) 19 or less, adult: Secondary | ICD-10-CM

## 2014-06-29 DIAGNOSIS — M810 Age-related osteoporosis without current pathological fracture: Secondary | ICD-10-CM | POA: Diagnosis present

## 2014-06-29 DIAGNOSIS — Z96643 Presence of artificial hip joint, bilateral: Secondary | ICD-10-CM | POA: Diagnosis present

## 2014-06-29 DIAGNOSIS — Z7982 Long term (current) use of aspirin: Secondary | ICD-10-CM

## 2014-06-29 DIAGNOSIS — Z8249 Family history of ischemic heart disease and other diseases of the circulatory system: Secondary | ICD-10-CM | POA: Diagnosis not present

## 2014-06-29 DIAGNOSIS — R4701 Aphasia: Secondary | ICD-10-CM | POA: Diagnosis present

## 2014-06-29 DIAGNOSIS — I4892 Unspecified atrial flutter: Secondary | ICD-10-CM | POA: Diagnosis present

## 2014-06-29 DIAGNOSIS — I10 Essential (primary) hypertension: Secondary | ICD-10-CM | POA: Diagnosis present

## 2014-06-29 DIAGNOSIS — Z7901 Long term (current) use of anticoagulants: Secondary | ICD-10-CM

## 2014-06-29 DIAGNOSIS — E785 Hyperlipidemia, unspecified: Secondary | ICD-10-CM | POA: Diagnosis present

## 2014-06-29 DIAGNOSIS — K5900 Constipation, unspecified: Secondary | ICD-10-CM | POA: Diagnosis present

## 2014-06-29 DIAGNOSIS — I639 Cerebral infarction, unspecified: Secondary | ICD-10-CM | POA: Diagnosis not present

## 2014-06-29 DIAGNOSIS — F329 Major depressive disorder, single episode, unspecified: Secondary | ICD-10-CM | POA: Diagnosis present

## 2014-06-29 DIAGNOSIS — F411 Generalized anxiety disorder: Secondary | ICD-10-CM | POA: Diagnosis present

## 2014-06-29 DIAGNOSIS — F32A Depression, unspecified: Secondary | ICD-10-CM | POA: Diagnosis present

## 2014-06-29 DIAGNOSIS — I6789 Other cerebrovascular disease: Secondary | ICD-10-CM | POA: Diagnosis not present

## 2014-06-29 DIAGNOSIS — F0390 Unspecified dementia without behavioral disturbance: Secondary | ICD-10-CM | POA: Diagnosis present

## 2014-06-29 DIAGNOSIS — F419 Anxiety disorder, unspecified: Secondary | ICD-10-CM | POA: Diagnosis present

## 2014-06-29 LAB — PROTIME-INR
INR: 1.2 (ref 0.00–1.49)
Prothrombin Time: 15.4 seconds — ABNORMAL HIGH (ref 11.6–15.2)

## 2014-06-29 LAB — APTT: aPTT: 28 seconds (ref 24–37)

## 2014-06-29 LAB — COMPREHENSIVE METABOLIC PANEL
ALT: 38 U/L (ref 14–54)
AST: 56 U/L — ABNORMAL HIGH (ref 15–41)
Albumin: 3.4 g/dL — ABNORMAL LOW (ref 3.5–5.0)
Alkaline Phosphatase: 79 U/L (ref 38–126)
Anion gap: 10 (ref 5–15)
BUN: 13 mg/dL (ref 6–20)
CO2: 27 mmol/L (ref 22–32)
Calcium: 8.9 mg/dL (ref 8.9–10.3)
Chloride: 97 mmol/L — ABNORMAL LOW (ref 101–111)
Creatinine, Ser: 0.85 mg/dL (ref 0.44–1.00)
GFR calc Af Amer: >60 mL/min (ref >60)
GFR calc non Af Amer: 59 mL/min — ABNORMAL LOW (ref >60)
Glucose, Bld: 88 mg/dL (ref 70–99)
Potassium: 4 mmol/L (ref 3.5–5.1)
Sodium: 134 mmol/L — ABNORMAL LOW (ref 135–145)
Total Bilirubin: 0.8 mg/dL (ref 0.3–1.2)
Total Protein: 7 g/dL (ref 6.5–8.1)

## 2014-06-29 LAB — URINALYSIS, ROUTINE W REFLEX MICROSCOPIC
Bilirubin Urine: NEGATIVE
Glucose, UA: NEGATIVE mg/dL
Hgb urine dipstick: NEGATIVE
Ketones, ur: NEGATIVE mg/dL
Nitrite: NEGATIVE
Protein, ur: NEGATIVE mg/dL
Specific Gravity, Urine: 1.008 (ref 1.005–1.030)
Urobilinogen, UA: 0.2 mg/dL (ref 0.0–1.0)
pH: 7.5 (ref 5.0–8.0)

## 2014-06-29 LAB — RAPID URINE DRUG SCREEN, HOSP PERFORMED
Amphetamines: NOT DETECTED
Barbiturates: NOT DETECTED
Benzodiazepines: NOT DETECTED
Cocaine: NOT DETECTED
Opiates: NOT DETECTED
Tetrahydrocannabinol: NOT DETECTED

## 2014-06-29 LAB — CBC
HCT: 34.9 % — ABNORMAL LOW (ref 36.0–46.0)
Hemoglobin: 11.4 g/dL — ABNORMAL LOW (ref 12.0–15.0)
MCH: 30.5 pg (ref 26.0–34.0)
MCHC: 32.7 g/dL (ref 30.0–36.0)
MCV: 93.3 fL (ref 78.0–100.0)
Platelets: 246 10*3/uL (ref 150–400)
RBC: 3.74 MIL/uL — ABNORMAL LOW (ref 3.87–5.11)
RDW: 14.4 % (ref 11.5–15.5)
WBC: 7.1 10*3/uL (ref 4.0–10.5)

## 2014-06-29 LAB — URINE MICROSCOPIC-ADD ON

## 2014-06-29 LAB — DIFFERENTIAL
Basophils Absolute: 0 10*3/uL (ref 0.0–0.1)
Basophils Relative: 0 % (ref 0–1)
Eosinophils Absolute: 0.1 10*3/uL (ref 0.0–0.7)
Eosinophils Relative: 1 % (ref 0–5)
Lymphocytes Relative: 29 % (ref 12–46)
Lymphs Abs: 2.1 10*3/uL (ref 0.7–4.0)
Monocytes Absolute: 0.9 10*3/uL (ref 0.1–1.0)
Monocytes Relative: 13 % — ABNORMAL HIGH (ref 3–12)
Neutro Abs: 4.1 10*3/uL (ref 1.7–7.7)
Neutrophils Relative %: 57 % (ref 43–77)

## 2014-06-29 LAB — I-STAT CHEM 8, ED
BUN: 17 mg/dL (ref 6–20)
Calcium, Ion: 1.12 mmol/L — ABNORMAL LOW (ref 1.13–1.30)
Chloride: 97 mmol/L — ABNORMAL LOW (ref 101–111)
Creatinine, Ser: 0.9 mg/dL (ref 0.44–1.00)
Glucose, Bld: 86 mg/dL (ref 70–99)
HCT: 40 % (ref 36.0–46.0)
Hemoglobin: 13.6 g/dL (ref 12.0–15.0)
Potassium: 4 mmol/L (ref 3.5–5.1)
Sodium: 135 mmol/L (ref 135–145)
TCO2: 26 mmol/L (ref 0–100)

## 2014-06-29 LAB — I-STAT TROPONIN, ED: Troponin i, poc: 0 ng/mL (ref 0.00–0.08)

## 2014-06-29 LAB — MRSA PCR SCREENING: MRSA by PCR: NEGATIVE

## 2014-06-29 LAB — ETHANOL: Alcohol, Ethyl (B): <5 mg/dL (ref <5)

## 2014-06-29 MED ORDER — ACETAMINOPHEN 325 MG PO TABS
650.0000 mg | ORAL_TABLET | ORAL | Status: DC | PRN
  Administered 2014-06-30: 650 mg via ORAL
  Filled 2014-06-29: qty 2

## 2014-06-29 MED ORDER — ALTEPLASE (STROKE) FULL DOSE INFUSION
0.9000 mg/kg | Freq: Once | INTRAVENOUS | Status: AC
  Administered 2014-06-29: 45 mg via INTRAVENOUS
  Filled 2014-06-29: qty 45

## 2014-06-29 MED ORDER — ACETAMINOPHEN 650 MG RE SUPP: 650.0000 mg | RECTAL | Status: DC | PRN

## 2014-06-29 MED ORDER — ZIPRASIDONE HCL 80 MG PO CAPS
80.0000 mg | ORAL_CAPSULE | Freq: Every day | ORAL | Status: DC
  Administered 2014-06-29 – 2014-06-30 (×2): 80 mg via ORAL
  Filled 2014-06-29 (×3): qty 1

## 2014-06-29 MED ORDER — LABETALOL HCL 5 MG/ML IV SOLN: 10.0000 mg | INTRAVENOUS | Status: DC | PRN

## 2014-06-29 MED ORDER — SODIUM CHLORIDE 0.9 % IV SOLN
INTRAVENOUS | Status: DC
  Administered 2014-06-29: 50 mL/h via INTRAVENOUS

## 2014-06-29 MED ORDER — STROKE: EARLY STAGES OF RECOVERY BOOK
Freq: Once | Status: AC
  Administered 2014-06-29: 19:00:00
  Filled 2014-06-29: qty 1

## 2014-06-29 MED ORDER — LORAZEPAM 0.5 MG PO TABS
0.5000 mg | ORAL_TABLET | Freq: Every day | ORAL | Status: DC
  Administered 2014-06-29 – 2014-06-30 (×2): 0.5 mg via ORAL
  Filled 2014-06-29 (×2): qty 1

## 2014-06-29 MED ORDER — SENNOSIDES-DOCUSATE SODIUM 8.6-50 MG PO TABS
1.0000 | ORAL_TABLET | Freq: Every evening | ORAL | Status: DC | PRN
  Filled 2014-06-29: qty 1

## 2014-06-29 MED ORDER — PANTOPRAZOLE SODIUM 40 MG IV SOLR
40.0000 mg | Freq: Every day | INTRAVENOUS | Status: DC
  Administered 2014-06-29: 40 mg via INTRAVENOUS
  Filled 2014-06-29 (×2): qty 40

## 2014-06-29 MED ORDER — SODIUM CHLORIDE 0.9 % IV SOLN
50.0000 mL | Freq: Once | INTRAVENOUS | Status: AC
  Administered 2014-06-29: 50 mL via INTRAVENOUS

## 2014-06-29 NOTE — H&P (Signed)
Admission H&P    Chief Complaint: Difficulty with speech  HPI: Yolanda Baker is an 81 y.o. female who lives in a facility.  The patient was noted to awaken normally today.  Was able to go to breakfast and ate normally but after breakfast while on her Kipp back to her room was noted to have slurred speech and difficulty getting her words out correctly.  EMS was called at that time and the patient was brought to the ED.  Per EMS the patient cleared while en route but on arrival the patient had return of her speech difficulties.   At baseline the patient walks with a walker and requires assistance for dressing.  She is not incontinent and handles her own toileting needs  She is able to feed herself as well.    Date last known well: Date: 06/29/2014 Time last known well: Time: 08:15 tPA Given: Yes Pre-admission mRankin: 3  Past Medical History  Diagnosis Date  . Osteoporosis   . Hypertension   . Arthritis     neck  . Depression   . Anxiety   . Dysrhythmia     hx A-FLUTTER after hip surg April 2015- take metoprolol  . Shortness of breath     with activity  . Stroke     "mini stroke" per MRI - no deficiets   . Hip joint pain     RT  . Bruises easily   . Eczema     Past Surgical History  Procedure Laterality Date  . Left hip Left 2008  . Abdominal hysterectomy    . Fracture surgery Right 06/05/2013    hip  . Intramedullary (im) nail intertrochanteric Right 06/05/2013    Procedure: INTRAMEDULLARY (IM) NAIL INTERTROCHANTRIC;  Surgeon: Loanne Drilling, MD;  Location: [facility];  Service: Orthopedics;  Laterality: Right;  . Joint replacement      left hip  . Hip arthroplasty Right 10/03/2013    Procedure: CONVERSION OF PREVIOUS RIGHT HIP SURGERY TO A RIGHT TOTAL HIP ARTHROPLASTY;  Surgeon: Loanne Drilling, MD;  Location: [facility];  Service: Orthopedics;  Laterality: Right;    Family history: Both parents deceased.  Mother died at 44 of old age.  Father died with CAD due to a MI.  All eight  siblings are deceased.  All had hypertension.  Social History:  reports that she has never smoked. She has never used smokeless tobacco. She reports that she does not drink alcohol or use illicit drugs.  Allergies:  Allergies  Allergen Reactions  . Flagyl [Metronidazole]     Fingers get "numb"   Medications: Current outpatient prescriptions:  .  acetaminophen (TYLENOL) 500 MG chewable tablet, Chew 1,000 mg by mouth every 6 (six) hours as needed for pain. , Disp: , Rfl:  .  amLODipine (NORVASC) 5 MG tablet, Take 5 mg by mouth every morning., Disp: , Rfl:  .  antiseptic oral rinse (BIOTENE) LIQD, 15 mLs by Mouth Rinse route 2 (two) times daily as needed for dry mouth., Disp: , Rfl:  .  aspirin 81 MG chewable tablet, Chew 81 mg by mouth daily., Disp: , Rfl:  .  Biotin 5 MG CAPS, Take 5 mg by mouth daily., Disp: , Rfl:  .  bisacodyl (DULCOLAX) 5 MG EC tablet, Take 5 mg by mouth daily as needed for moderate constipation., Disp: , Rfl:  .  Calcium Citrate (CITRACAL PO), Take 1 capsule by mouth daily., Disp: , Rfl:  .  citalopram (CELEXA) 20  MG tablet, Take 30 mg by mouth daily., Disp: , Rfl:  .  docusate sodium (COLACE) 100 MG capsule, Take 100 mg by mouth at bedtime., Disp: , Rfl:  .  galantamine (RAZADYNE ER) 8 MG 24 hr capsule, Take 8 mg by mouth daily with breakfast., Disp: , Rfl:  .  LORazepam (ATIVAN) 0.5 MG tablet, Take one tablet by mouth every night at bedtime for rest, Disp: 30 tablet, Rfl: 5 .  metoprolol tartrate (LOPRESSOR) 25 MG tablet, Take 25-50 mg by mouth 2 (two) times daily. 2 in the morning and 1 at night, Disp: , Rfl:  .  Multiple Vitamins-Minerals (CERTAVITE SENIOR/ANTIOXIDANT PO), Take 1 capsule by mouth daily., Disp: , Rfl:  .  polyvinyl alcohol (LIQUIFILM TEARS) 1.4 % ophthalmic solution, Place 1 drop into both eyes 3 (three) times daily as needed for dry eyes., Disp: , Rfl:  .  potassium chloride SA (K-DUR,KLOR-CON) 20 MEQ tablet, Take 20 mEq by mouth every other day.,  Disp: , Rfl:  .  ziprasidone (GEODON) 80 MG capsule, Take 80 mg by mouth at bedtime. , Disp: , Rfl:  .  enoxaparin (LOVENOX) 40 MG/0.4ML injection, Inject 0.4 mLs (40 mg total) into the skin daily. Continue injections for one more week and then resume daily baby Aspirin 81 mg. (Patient not taking: Reported on 06/29/2014), Disp: 7 Syringe, Rfl: 0 .  HYDROcodone-acetaminophen (NORCO/VICODIN) 5-325 MG per tablet, Take 1-2 tablets by mouth every 6 (six) hours as needed for moderate pain. (Patient not taking: Reported on 06/29/2014), Disp: 80 tablet, Rfl: 0 .  iron polysaccharides (NIFEREX) 150 MG capsule, Take 1 capsule (150 mg total) by mouth 2 (two) times daily. Take for three more weeks (Patient not taking: Reported on 06/29/2014), Disp: 42 capsule, Rfl: 0 .  methocarbamol (ROBAXIN) 500 MG tablet, Take 1 tablet (500 mg total) by mouth every 6 (six) hours as needed for muscle spasms. (Patient not taking: Reported on 06/29/2014), Disp: 80 tablet, Rfl: 0 .  ondansetron (ZOFRAN) 4 MG tablet, Take 1 tablet (4 mg total) by mouth every 6 (six) hours as needed for nausea. (Patient not taking: Reported on 06/29/2014), Disp: 40 tablet, Rfl: 0 .  polyethylene glycol (MIRALAX / GLYCOLAX) packet, Take 17 g by mouth daily as needed for mild constipation. (Patient not taking: Reported on 06/29/2014), Disp: 14 each, Rfl: 0 .  psyllium (METAMUCIL) 58.6 % powder, Take 1 packet by mouth 2 (two) times daily as needed (Constipation). Pt takes 1 tablespoon at each dose, Disp: , Rfl:  .  traMADol (ULTRAM) 50 MG tablet, Take 1-2 tablets (50-100 mg total) by mouth every 6 (six) hours as needed (mild pain). (Patient not taking: Reported on 06/29/2014), Disp: 60 tablet, Rfl: 1  ROS: History obtained from chart review  General ROS: negative for - chills, fatigue, fever, night sweats, weight gain or weight loss Psychological ROS: negative for - behavioral disorder, hallucinations, memory difficulties, mood swings or suicidal  ideation Ophthalmic ROS: negative for - blurry vision, double vision, eye pain or loss of vision ENT ROS: negative for - epistaxis, nasal discharge, oral lesions, sore throat, tinnitus or vertigo Allergy and Immunology ROS: negative for - hives or itchy/watery eyes Hematological and Lymphatic ROS: negative for - bleeding problems, bruising or swollen lymph nodes Endocrine ROS: negative for - galactorrhea, hair pattern changes, polydipsia/polyuria or temperature intolerance Respiratory ROS: negative for - cough, hemoptysis, shortness of breath or wheezing Cardiovascular ROS: negative for - chest pain, dyspnea on exertion, edema or irregular heartbeat Gastrointestinal  ROS: negative for - abdominal pain, diarrhea, hematemesis, nausea/vomiting or stool incontinence Genito-Urinary ROS: negative for - dysuria, hematuria, incontinence or urinary frequency/urgency Musculoskeletal ROS: hip pain Neurological ROS: as noted in HPI Dermatological ROS: negative for rash and skin lesion changes  Physical Examination: Blood pressure 149/77, pulse 64, temperature 98.6 F (37 C), temperature source Oral, resp. rate 23, weight 49.896 kg (110 lb), SpO2 100 %.  General Examination:  HEENT-  Normocephalic, no lesions, without obvious abnormality.  Normal external eye and conjunctiva.  Normal TM's bilaterally.  Normal auditory canals and external ears. Normal external nose, mucus membranes and septum.  Normal pharynx. Cardiovascular- S1, S2 normal, pulses palpable throughout   Lungs- chest clear, no wheezing, rales, normal symmetric air entry Abdomen- soft, non-tender; bowel sounds normal; no masses,  no organomegaly Extremities- no edema Lymph-no adenopathy palpable Musculoskeletal-no joint tenderness, deformity or swelling Skin-warm and dry, no hyperpigmentation, vitiligo, or suspicious lesions  Neurological Examination Mental Status: Alert.  Unable to provide age or date.  Able to follow some simple  commands but has difficulty with others.  Expressive aphasia noted, particularly with prompted speech.   Cranial Nerves: II: Discs flat bilaterally; Visual fields grossly normal, pupils equal, round, reactive to light and accommodation III,IV, VI: ptosis not present, extra-ocular motions intact bilaterally V,VII: smile symmetric, facial light touch sensation normal bilaterally VIII: hearing normal bilaterally IX,X: gag reflex present XI: bilateral shoulder shrug XII: midline tongue extension Motor: Right : Upper extremity   5/5    Left:     Upper extremity   5/5  Lower extremity   5/5     Lower extremity   5/5 Tone and bulk:normal tone throughout; no atrophy noted Sensory: Pinprick and light touch intact throughout, bilaterally Deep Tendon Reflexes: 2+ in the upper extremities, trace at the knees and ankles Plantars: Right: downgoing   Left: downgoing Cerebellar: No dysmetria noted in finger-to-nose and heel-to-shin testing bilaterally Gait: not tested due to safety issues   Laboratory Studies:   Basic Metabolic Panel:  Recent Labs Lab 06/29/14 0925 06/29/14 0933  NA 134* 135  K 4.0 4.0  CL 97* 97*  CO2 27  --   GLUCOSE 88 86  BUN 13 17  CREATININE 0.85 0.90  CALCIUM 8.9  --     Liver Function Tests:  Recent Labs Lab 06/29/14 0925  AST 56*  ALT 38  ALKPHOS 79  BILITOT 0.8  PROT 7.0  ALBUMIN 3.4*   No results for input(s): LIPASE, AMYLASE in the last 168 hours. No results for input(s): AMMONIA in the last 168 hours.  CBC:  Recent Labs Lab 06/29/14 0925 06/29/14 0933  WBC 7.1  --   NEUTROABS 4.1  --   HGB 11.4* 13.6  HCT 34.9* 40.0  MCV 93.3  --   PLT 246  --     Cardiac Enzymes: No results for input(s): CKTOTAL, CKMB, CKMBINDEX, TROPONINI in the last 168 hours.  BNP: Invalid input(s): POCBNP  CBG: No results for input(s): GLUCAP in the last 168 hours.  Microbiology: Results for orders placed or performed during the hospital encounter of  10/02/13  Surgical pcr screen     Status: None   Collection Time: 10/02/13  9:00 AM  Result Value Ref Range Status   MRSA, PCR NEGATIVE NEGATIVE Final   Staphylococcus aureus NEGATIVE NEGATIVE Final    Comment:        The Xpert SA Assay (FDA approved for NASAL specimens in patients over 79 years of age),  is one component of a comprehensive surveillance program.  Test performance has been validated by [facility] for patients greater than or equal to 37 year old. It is not intended to diagnose infection nor to guide or monitor treatment.    Coagulation Studies:  Recent Labs  06/29/14 0925  LABPROT 15.4*  INR 1.20    Urinalysis:  Recent Labs Lab 06/29/14 0943  COLORURINE YELLOW  LABSPEC 1.008  PHURINE 7.5  GLUCOSEU NEGATIVE  HGBUR NEGATIVE  BILIRUBINUR NEGATIVE  KETONESUR NEGATIVE  PROTEINUR NEGATIVE  UROBILINOGEN 0.2  NITRITE NEGATIVE  LEUKOCYTESUR SMALL*    Lipid Panel:  No results found for: CHOL, TRIG, HDL, CHOLHDL, VLDL, LDLCALC  HgbA1C: No results found for: HGBA1C  Urine Drug Screen:  No results found for: LABOPIA, COCAINSCRNUR, LABBENZ, AMPHETMU, THCU, LABBARB  Alcohol Level:  Recent Labs Lab 06/29/14 0925  ETH <5    Other results: EKG: sinus rhythm at 64 bpm.  Imaging: Ct Head Wo Contrast  06/29/2014   CLINICAL DATA:  Code stroke  EXAM: CT HEAD WITHOUT CONTRAST  TECHNIQUE: Contiguous axial images were obtained from the base of the skull through the vertex without intravenous contrast.  COMPARISON:  09/28/2010  FINDINGS: No skull fracture is noted. Paranasal sinuses and mastoid air cells are unremarkable. Stable cerebral atrophy. Stable remote infarct in left lenticular nucleus and left coronl radiata. No definite acute cortical infarction. Stable periventricular and patchy subcortical chronic white matter disease. No mass lesion is noted on this unenhanced scan. Atherosclerotic calcifications of carotid siphon again noted.  IMPRESSION: No acute  intracranial abnormality. Stable atrophy and chronic white matter disease. Stable remote infarct in left lenticular nucleus and left corona radiata. No definite acute cortical infarction.  These results were called by telephone at the time of interpretation on 06/29/2014 at 9:34 am to Dr. Thad Ranger, who verbally acknowledged these results.   Electronically Signed   By: Natasha Mead M.D.   On: 06/29/2014 09:35    Assessment: 81 y.o. female presenting with aphasia.  Patient with a history of atrial flutter.  EKG now sinus.  No focal weakness noted.  NIHSS of 5.  Head CT personally reviewed and shows no acute changes.  Administration of tPA delayed while awaiting daughter since patient could not give full history due to aphasia.  Contraindications reviewed with daughter.  Risks and benefits discussed.  Verbal consent obtained.  tPA administered.  Patient to be admitted for further management.    Stroke Risk Factors - hypertension  Plan: 1. HgbA1c, fasting lipid panel 2. MRI, MRA  of the brain without contrast 3. PT consult, OT consult, Speech consult 4. Echocardiogram 5. Carotid dopplers 6. Prophylactic therapy-None 7. NPO until RN stroke swallow screen 8. Telemetry monitoring 9. Frequent neuro checks 10. Admission to NICU 11. Repeat head CT in 24 hours 12. Geodon and Ativan to be continued   This patient is critically ill and at significant risk of neurological worsening, death and care requires constant monitoring of vital signs, hemodynamics,respiratory and cardiac monitoring, neurological assessment, discussion with family, other specialists and medical decision making of high complexity. I spent 60 minutes of neurocritical care time  in the care of  this patient.  Thana Farr, MD [facility] [phone] 06/29/2014  10:22 AM

## 2014-06-29 NOTE — Code Documentation (Signed)
See Charlton Amor RN note for details prior to arrival. Patient's baseline is ambulatory with at walker and fully conversant at her facility. Patient arrived via EMS at 0910 Code Stroke called at [phone].  Stat Head Ct and labs done. NIHSS 5, unable to answer questions, moderate aphasia and dysarthria. Dr Thad Ranger at bedside to assess patient. Daughter at bedside TPA started at [phone] via left PIV Admitted to 3M09 Bleeding noted from tooth/gums just prior to leaving ED.  Dr Thad Ranger will reassess the bleeding.

## 2014-06-29 NOTE — ED Provider Notes (Signed)
CSN: [id number]     Arrival date & time 06/29/14  0908 History   First MD Initiated Contact with Patient 06/29/14 0914     Chief Complaint  Patient presents with  . Code Stroke    @ (Consider location/radiation/quality/duration/timing/severity/associated sxs/prior Treatment) HPI The patient had awakened and had breakfast as per usual. Staff at her nursing facility noticed at 8:15 the sudden onset of slurred speech and difficulty with word finding. The symptoms had resolved upon arrival of EMS. During EMS transport just upon arriving to the emergency department, symptoms of slurred speech and aphasia returned. There is no report of motor dysfunction. Code stroke was initiated. The patient does not have pain complaints. She was aware of difficulty with word finding and slurred speech. Past Medical History  Diagnosis Date  . Osteoporosis   . Hypertension   . Arthritis     neck  . Depression   . Anxiety   . Dysrhythmia     hx A-FLUTTER after hip surg April 2015- take metoprolol  . Shortness of breath     with activity  . Stroke     "mini stroke" per MRI - no deficiets   . Hip joint pain     RT  . Bruises easily   . Eczema    Past Surgical History  Procedure Laterality Date  . Left hip Left 2008  . Abdominal hysterectomy    . Fracture surgery Right 06/05/2013    hip  . Intramedullary (im) nail intertrochanteric Right 06/05/2013    Procedure: INTRAMEDULLARY (IM) NAIL INTERTROCHANTRIC;  Surgeon: Loanne Drilling, MD;  Location: [facility];  Service: Orthopedics;  Laterality: Right;  . Joint replacement      left hip  . Hip arthroplasty Right 10/03/2013    Procedure: CONVERSION OF PREVIOUS RIGHT HIP SURGERY TO A RIGHT TOTAL HIP ARTHROPLASTY;  Surgeon: Loanne Drilling, MD;  Location: [facility];  Service: Orthopedics;  Laterality: Right;   History reviewed. No pertinent family history. History  Substance Use Topics  . Smoking status: Never Smoker   . Smokeless tobacco: Never Used   . Alcohol Use: No   OB History    No data available     Review of Systems 10 Systems reviewed and are negative for acute change except as noted in the HPI.    Allergies  Flagyl  Home Medications   Prior to Admission medications   Medication Sig Start Date End Date Taking? Authorizing Provider  acetaminophen (TYLENOL) 500 MG chewable tablet Chew 1,000 mg by mouth every 6 (six) hours as needed for pain.    Yes Historical Provider, MD  amLODipine (NORVASC) 5 MG tablet Take 5 mg by mouth every morning.   Yes Historical Provider, MD  antiseptic oral rinse (BIOTENE) LIQD 15 mLs by Mouth Rinse route 2 (two) times daily as needed for dry mouth.   Yes Historical Provider, MD  aspirin 81 MG chewable tablet Chew 81 mg by mouth daily.   Yes Historical Provider, MD  Biotin 5 MG CAPS Take 5 mg by mouth daily.   Yes Historical Provider, MD  bisacodyl (DULCOLAX) 5 MG EC tablet Take 5 mg by mouth daily as needed for moderate constipation.   Yes Historical Provider, MD  Calcium Citrate (CITRACAL PO) Take 1 capsule by mouth daily.   Yes Historical Provider, MD  citalopram (CELEXA) 20 MG tablet Take 30 mg by mouth daily.   Yes Historical Provider, MD  docusate sodium (COLACE) 100 MG capsule Take 100 mg  by mouth at bedtime.   Yes Historical Provider, MD  galantamine (RAZADYNE ER) 8 MG 24 hr capsule Take 8 mg by mouth daily with breakfast.   Yes Historical Provider, MD  LORazepam (ATIVAN) 0.5 MG tablet Take one tablet by mouth every night at bedtime for rest 10/22/13  Yes Tiffany L Reed, DO  metoprolol tartrate (LOPRESSOR) 25 MG tablet Take 25-50 mg by mouth 2 (two) times daily. 2 in the morning and 1 at night   Yes Historical Provider, MD  Multiple Vitamins-Minerals (CERTAVITE SENIOR/ANTIOXIDANT PO) Take 1 capsule by mouth daily.   Yes Historical Provider, MD  polyvinyl alcohol (LIQUIFILM TEARS) 1.4 % ophthalmic solution Place 1 drop into both eyes 3 (three) times daily as needed for dry eyes.   Yes  Historical Provider, MD  potassium chloride SA (K-DUR,KLOR-CON) 20 MEQ tablet Take 20 mEq by mouth every other day.   Yes Historical Provider, MD  ziprasidone (GEODON) 80 MG capsule Take 80 mg by mouth at bedtime.    Yes Historical Provider, MD  enoxaparin (LOVENOX) 40 MG/0.4ML injection Inject 0.4 mLs (40 mg total) into the skin daily. Continue injections for one more week and then resume daily baby Aspirin 81 mg. Patient not taking: Reported on 06/29/2014 10/07/13   [name], PA-C  HYDROcodone-acetaminophen (NORCO/VICODIN) 5-325 MG per tablet Take 1-2 tablets by mouth every 6 (six) hours as needed for moderate pain. Patient not taking: Reported on 06/29/2014 10/08/13   Tiffany L Reed, DO  iron polysaccharides (NIFEREX) 150 MG capsule Take 1 capsule (150 mg total) by mouth 2 (two) times daily. Take for three more weeks Patient not taking: Reported on 06/29/2014 10/07/13   [name], PA-C  methocarbamol (ROBAXIN) 500 MG tablet Take 1 tablet (500 mg total) by mouth every 6 (six) hours as needed for muscle spasms. Patient not taking: Reported on 06/29/2014 10/07/13   [name], PA-C  ondansetron (ZOFRAN) 4 MG tablet Take 1 tablet (4 mg total) by mouth every 6 (six) hours as needed for nausea. Patient not taking: Reported on 06/29/2014 10/07/13   [name], PA-C  polyethylene glycol [facility]) packet Take 17 g by mouth daily as needed for mild constipation. Patient not taking: Reported on 06/29/2014 10/07/13   [name], PA-C  psyllium (METAMUCIL) 58.6 % powder Take 1 packet by mouth 2 (two) times daily as needed (Constipation). Pt takes 1 tablespoon at each dose    Historical Provider, MD  traMADol (ULTRAM) 50 MG tablet Take 1-2 tablets (50-100 mg total) by mouth every 6 (six) hours as needed (mild pain). Patient not taking: Reported on 06/29/2014 10/07/13   [name], PA-C   BP 149/77 mmHg  Pulse 64  Temp(Src) 98.6 F (37 C) (Oral)  Resp 23  Wt 110 lb (49.896 kg)  SpO2 100% Physical  Exam  Constitutional: She appears well-developed and well-nourished.  Patient is a slender elderly female. She is in good physical condition. She is alert with good color and no respiratory distress.  HENT:  Head: Normocephalic and atraumatic.  Eyes: EOM are normal. Pupils are equal, round, and reactive to light.  Neck: Neck supple.  Cardiovascular: Normal rate, regular rhythm, normal heart sounds and intact distal pulses.   Pulmonary/Chest: Effort normal and breath sounds normal.  Abdominal: Soft. Bowel sounds are normal. She exhibits no distension. There is no tenderness.  Musculoskeletal: Normal range of motion. She exhibits no edema.  The patient has some degree of extremity range of motion limitations due to arthritis  and prior joint fixations. For age however she does have reasonable use of extremities and ability to follow commands. There is no peripheral edema.  Neurological: She is alert. She has normal strength. Coordination normal. GCS eye subscore is 4. GCS verbal subscore is 5. GCS motor subscore is 6.  The patient is alert and responsive. Her speech is mildly slurred with mild aphasia. She does however respond appropriately to commands and by appearances is oriented to situation. She follows appropriate commands for bilateral grip strength that is symmetric. She has bilateral movement of the lower extremities at command however due to prior joint fixation some diminished ability to flex and extend at the ankles.  Skin: Skin is warm, dry and intact.  Psychiatric: She has a normal mood and affect.    ED Course  Procedures (including critical care time) Labs Review Labs Reviewed  PROTIME-INR - Abnormal; Notable for the following:    Prothrombin Time 15.4 (*)    All other components within normal limits  CBC - Abnormal; Notable for the following:    RBC 3.74 (*)    Hemoglobin 11.4 (*)    HCT 34.9 (*)    All other components within normal limits  DIFFERENTIAL - Abnormal;  Notable for the following:    Monocytes Relative 13 (*)    All other components within normal limits  COMPREHENSIVE METABOLIC PANEL - Abnormal; Notable for the following:    Sodium 134 (*)    Chloride 97 (*)    Albumin 3.4 (*)    AST 56 (*)    GFR calc non Af Amer 59 (*)    All other components within normal limits  URINALYSIS, ROUTINE W REFLEX MICROSCOPIC - Abnormal; Notable for the following:    Leukocytes, UA SMALL (*)    All other components within normal limits  I-STAT CHEM 8, ED - Abnormal; Notable for the following:    Chloride 97 (*)    Calcium, Ion 1.12 (*)    All other components within normal limits  ETHANOL  APTT  URINE MICROSCOPIC-ADD ON  URINE RAPID DRUG SCREEN (HOSP PERFORMED)  I-STAT TROPOININ, ED    Imaging Review Ct Head Wo Contrast  06/29/2014   CLINICAL DATA:  Code stroke  EXAM: CT HEAD WITHOUT CONTRAST  TECHNIQUE: Contiguous axial images were obtained from the base of the skull through the vertex without intravenous contrast.  COMPARISON:  09/28/2010  FINDINGS: No skull fracture is noted. Paranasal sinuses and mastoid air cells are unremarkable. Stable cerebral atrophy. Stable remote infarct in left lenticular nucleus and left coronl radiata. No definite acute cortical infarction. Stable periventricular and patchy subcortical chronic white matter disease. No mass lesion is noted on this unenhanced scan. Atherosclerotic calcifications of carotid siphon again noted.  IMPRESSION: No acute intracranial abnormality. Stable atrophy and chronic white matter disease. Stable remote infarct in left lenticular nucleus and left corona radiata. No definite acute cortical infarction.  These results were called by telephone at the time of interpretation on 06/29/2014 at 9:34 am to Dr. Thad Ranger, who verbally acknowledged these results.   Electronically Signed   By: Natasha Mead M.D.   On: 06/29/2014 09:35     EKG Interpretation   Date/Time:  Tuesday Jun 29 2014 09:27:17  EDT Ventricular Rate:  64 PR Interval:  213 QRS Duration: 77 QT Interval:  430 QTC Calculation: 444 R Axis:   6 Text Interpretation:  Sinus rhythm Borderline prolonged PR interval Left  atrial enlargement Anteroseptal infarct, age indeterminate Confirmed by  Donnald Garre, MD, Lebron Conners [id number]) on 06/29/2014 10:13:44 AM     Consult: Dr. Thad Ranger evaluated the patient as a code stroke and determined to initiate TPA. MDM   Final diagnoses:  Acute CVA (cerebrovascular accident)   The patient presents as outlined above. Her airway is stable. Her mental status is alert and appropriate. She does not have focal motor findings for the extremity exam. She is receiving TPA and will be admitted for ongoing management.    Arby Barrette, MD 06/29/14 1019

## 2014-06-29 NOTE — ED Notes (Signed)
Pt arrives EMS from assisted living facility where pt woke up and performed usual routine. Coming back from breakfast this AM at 815 developed sudden onset of slurred speech, aphasia. On EMs arrival symptoms had resolved. Code stroke cancelled. AS EMs was arriving to this facility patients symptoms returned. Code stroke activated. Pt currently aphasic and dysarthric.

## 2014-06-29 NOTE — Progress Notes (Signed)
Pt reported that she takes metamucil 3 x daily for chronic constipation, requested order from Felicie Morn, [facility] and he instructed me to defer to stroke order set for constipation.

## 2014-06-29 NOTE — ED Notes (Signed)
Pt noted to have bleeding from the mouth. Dr. Thad Ranger made aware.

## 2014-06-30 ENCOUNTER — Inpatient Hospital Stay (HOSPITAL_COMMUNITY): Payer: Medicare Other

## 2014-06-30 DIAGNOSIS — I639 Cerebral infarction, unspecified: Secondary | ICD-10-CM | POA: Diagnosis present

## 2014-06-30 DIAGNOSIS — I6789 Other cerebrovascular disease: Secondary | ICD-10-CM

## 2014-06-30 LAB — LIPID PANEL
Cholesterol: 148 mg/dL (ref 0–200)
HDL: 59 mg/dL (ref >40)
LDL Cholesterol: 80 mg/dL (ref 0–99)
Total CHOL/HDL Ratio: 2.5 RATIO
Triglycerides: 43 mg/dL (ref <150)
VLDL: 9 mg/dL (ref 0–40)

## 2014-06-30 MED ORDER — ZIPRASIDONE HCL 80 MG PO CAPS: 80.0000 mg | ORAL_CAPSULE | Freq: Every day | ORAL | Status: DC

## 2014-06-30 MED ORDER — GALANTAMINE HYDROBROMIDE ER 8 MG PO CP24
8.0000 mg | ORAL_CAPSULE | Freq: Every day | ORAL | Status: DC
  Administered 2014-07-01: 8 mg via ORAL
  Filled 2014-06-30 (×2): qty 1

## 2014-06-30 MED ORDER — METOPROLOL TARTRATE 25 MG PO TABS
25.0000 mg | ORAL_TABLET | Freq: Every day | ORAL | Status: DC
  Administered 2014-06-30: 25 mg via ORAL
  Filled 2014-06-30 (×2): qty 1

## 2014-06-30 MED ORDER — POLYVINYL ALCOHOL 1.4 % OP SOLN
1.0000 [drp] | Freq: Three times a day (TID) | OPHTHALMIC | Status: DC | PRN
Start: 2014-06-30 — End: 2014-07-01
  Filled 2014-06-30: qty 15

## 2014-06-30 MED ORDER — BIOTIN 5 MG PO CAPS: 5.0000 mg | ORAL_CAPSULE | Freq: Every day | ORAL | Status: DC

## 2014-06-30 MED ORDER — CITALOPRAM HYDROBROMIDE 20 MG PO TABS
30.0000 mg | ORAL_TABLET | Freq: Every day | ORAL | Status: DC
  Administered 2014-06-30 – 2014-07-01 (×2): 30 mg via ORAL
  Filled 2014-06-30 (×2): qty 1

## 2014-06-30 MED ORDER — POTASSIUM CHLORIDE CRYS ER 20 MEQ PO TBCR
20.0000 meq | EXTENDED_RELEASE_TABLET | ORAL | Status: DC
  Administered 2014-06-30: 20 meq via ORAL
  Filled 2014-06-30: qty 1

## 2014-06-30 MED ORDER — APIXABAN 2.5 MG PO TABS
2.5000 mg | ORAL_TABLET | Freq: Two times a day (BID) | ORAL | Status: DC
  Administered 2014-06-30 – 2014-07-01 (×3): 2.5 mg via ORAL
  Filled 2014-06-30 (×4): qty 1

## 2014-06-30 MED ORDER — DOCUSATE SODIUM 100 MG PO CAPS
100.0000 mg | ORAL_CAPSULE | Freq: Every day | ORAL | Status: DC
  Administered 2014-06-30: 100 mg via ORAL
  Filled 2014-06-30 (×2): qty 1

## 2014-06-30 MED ORDER — AMLODIPINE BESYLATE 5 MG PO TABS
5.0000 mg | ORAL_TABLET | Freq: Every morning | ORAL | Status: DC
  Administered 2014-06-30 – 2014-07-01 (×2): 5 mg via ORAL
  Filled 2014-06-30 (×2): qty 1

## 2014-06-30 MED ORDER — BIOTENE DRY MOUTH MT LIQD: 15.0000 mL | Freq: Two times a day (BID) | OROMUCOSAL | Status: DC | PRN

## 2014-06-30 MED ORDER — SIMVASTATIN 10 MG PO TABS
10.0000 mg | ORAL_TABLET | Freq: Every day | ORAL | Status: DC
  Administered 2014-06-30: 10 mg via ORAL
  Filled 2014-06-30 (×2): qty 1

## 2014-06-30 MED ORDER — PANTOPRAZOLE SODIUM 40 MG PO TBEC
40.0000 mg | DELAYED_RELEASE_TABLET | Freq: Every day | ORAL | Status: DC
  Administered 2014-06-30: 40 mg via ORAL
  Filled 2014-06-30: qty 1

## 2014-06-30 MED ORDER — METOPROLOL TARTRATE 50 MG PO TABS
50.0000 mg | ORAL_TABLET | Freq: Every day | ORAL | Status: DC
Start: 2014-06-30 — End: 2014-07-01
  Administered 2014-06-30 – 2014-07-01 (×2): 50 mg via ORAL
  Filled 2014-06-30 (×2): qty 1

## 2014-06-30 MED ORDER — BISACODYL 5 MG PO TBEC
5.0000 mg | DELAYED_RELEASE_TABLET | Freq: Every day | ORAL | Status: DC | PRN
  Filled 2014-06-30: qty 1

## 2014-06-30 MED ORDER — PSYLLIUM 95 % PO PACK
1.0000 | PACK | Freq: Three times a day (TID) | ORAL | Status: DC
  Administered 2014-06-30 – 2014-07-01 (×4): 1 via ORAL
  Filled 2014-06-30 (×6): qty 1

## 2014-06-30 MED ORDER — METOPROLOL TARTRATE 25 MG PO TABS: 25.0000 mg | ORAL_TABLET | Freq: Two times a day (BID) | ORAL | Status: DC

## 2014-06-30 NOTE — Progress Notes (Addendum)
Bilateral carotid artery duplex completed.  Right:  1-39% ICA stenosis.  Left:  >80% internal carotid artery stenosis by peak systolic velocities and ICA/CCA ratio, probably due to severe vessel tortuosity.  The end diastolic velocities are in the 40-59% range.  Bilateral:  Vertebral artery flow is antegrade.

## 2014-06-30 NOTE — Progress Notes (Signed)
STROKE TEAM PROGRESS NOTE   HISTORY Yolanda Baker is an 81 y.o. female who lives in a facility. The patient was noted to awaken normally today. Was able to go to breakfast and ate normally but after breakfast while on her Wonders back to her room was noted to have slurred speech and difficulty getting her words out correctly.LKW 06/29/2014 at 0815. EMS was called at that time and the patient was brought to the ED. Per EMS the patient cleared while en route but on arrival the patient had return of her speech difficulties. At baseline the patient walks with a walker and requires assistance for dressing. She is not incontinent and handles her own toileting needs She is able to feed herself as well. Pre-admission mRankin: 3 Patient was administered IV TPA. She was admitted to the neuro ICU for further evaluation and treatment (4N without staffing).   SUBJECTIVE (INTERVAL HISTORY) Her daughter is at the bedside.  Overall she feels her condition is completely resolved. She has not stumbled over any words today per her daughter.   OBJECTIVE Temp:  [97.6 F (36.4 C)-100.2 F (37.9 C)] 97.6 F (36.4 C) (05/04 0754) Pulse Rate:  [58-126] 66 (05/04 0800) Cardiac Rhythm:  [-] Normal sinus rhythm (05/04 0800) Resp:  [15-32] 15 (05/04 0800) BP: (119-177)/(51-128) 159/69 mmHg (05/04 0800) SpO2:  [97 %-100 %] 100 % (05/04 0800) Weight:  [46.3 kg (102 lb 1.2 oz)] 46.3 kg (102 lb 1.2 oz) (05/03 1045)  No results for input(s): GLUCAP in the last 168 hours.  Recent Labs Lab 06/29/14 0925 06/29/14 0933  NA 134* 135  K 4.0 4.0  CL 97* 97*  CO2 27  --   GLUCOSE 88 86  BUN 13 17  CREATININE 0.85 0.90  CALCIUM 8.9  --     Recent Labs Lab 06/29/14 0925  AST 56*  ALT 38  ALKPHOS 79  BILITOT 0.8  PROT 7.0  ALBUMIN 3.4*    Recent Labs Lab 06/29/14 0925 06/29/14 0933  WBC 7.1  --   NEUTROABS 4.1  --   HGB 11.4* 13.6  HCT 34.9* 40.0  MCV 93.3  --   PLT 246  --    No results for  input(s): CKTOTAL, CKMB, CKMBINDEX, TROPONINI in the last 168 hours.  Recent Labs  06/29/14 0925  LABPROT 15.4*  INR 1.20    Recent Labs  06/29/14 0943  COLORURINE YELLOW  LABSPEC 1.008  PHURINE 7.5  GLUCOSEU NEGATIVE  HGBUR NEGATIVE  BILIRUBINUR NEGATIVE  KETONESUR NEGATIVE  PROTEINUR NEGATIVE  UROBILINOGEN 0.2  NITRITE NEGATIVE  LEUKOCYTESUR SMALL*       Component Value Date/Time   CHOL 148 06/30/2014 0246   TRIG 43 06/30/2014 0246   HDL 59 06/30/2014 0246   CHOLHDL 2.5 06/30/2014 0246   VLDL 9 06/30/2014 0246   LDLCALC 80 06/30/2014 0246   No results found for: HGBA1C    Component Value Date/Time   LABOPIA NONE DETECTED 06/29/2014 0943   COCAINSCRNUR NONE DETECTED 06/29/2014 0943   LABBENZ NONE DETECTED 06/29/2014 0943   AMPHETMU NONE DETECTED 06/29/2014 0943   THCU NONE DETECTED 06/29/2014 0943   LABBARB NONE DETECTED 06/29/2014 0943     Recent Labs Lab 06/29/14 0925  ETH <5    Ct Baker Wo Contrast 06/29/2014   No acute intracranial abnormality. Stable atrophy and chronic white matter disease. Stable remote infarct in left lenticular nucleus and left corona radiata. No definite acute cortical infarction.    Yolanda Baker  Contrast 06/30/2014   1. Two small acute nonhemorrhagic infarcts in the left MCA territory, present along the mid sylvian fissure and parietal lobe. 2. Chronic small vessel disease and remote subcortical lacunar infarcts on the left. 3. Generalized brain atrophy, progressed from 2012.  Yolanda Baker/brain Wo Cm 06/30/2014  4. Mild intracranial atherosclerosis. No acute intracranial arterial finding or treatable stenosis.       PHYSICAL EXAM Frail elderly Caucasian lady not in distress. . Afebrile. Baker is nontraumatic. Neck is supple without bruit.    Cardiac exam no murmur or gallop. Lungs are clear to auscultation. Distal pulses are well felt. Neurological Exam : Awake alert oriented 3 with normal speech and language. Fluent speech without  any paraphasias or word hesitation. Able to name repeat and comprehend quite well. Extraocular movements are full range without nystagmus. Vision acuity seems adequate. Fundi were not visualized. Pupils are equal reactive. Extraocular movements are full range without nystagmus. Blinks to threat bilaterally. Face is symmetric without weakness. Tongue is midline. Motor system exam reveals symmetric and equal strength in all 4 extremities without focal weakness. Deep tendon reflexes are 2+ symmetric. Plantars are downgoing. Sensation is intact bilaterally. Coordination is accurate. Gait was not tested. NIHSS 0 ASSESSMENT/PLAN Yolanda Baker is a 81 y.o. female with history of post op atrial flutter in 2015 presenting with difficulty with speech. She did receive IV t-PA 06/29/2014 at 0955.   Stroke:  left MCA territory embolic infarct secondary to known atrial fibrillation   Post tPA oral bleeding resolved. Amount insignificant. Etiology of bleeding unknown - no recent surgery, no loose teeth, etc.  Resultant  Expressive aphasia resolved  MRI  left MCA territory embolic infarcts - mid sylvian fissure and parietal lobe  MRA  Unremarkable   Carotid Doppler  pending   2D Echo  pending   HgbA1c pending  SCDs for VTE prophylaxis Diet Heart Room service appropriate?: Yes; Fluid consistency:: Thin  aspirin 81 mg orally every day prior to admission, now on no antithrombotics as within 24h of tPA  Ongoing aggressive stroke risk factor management  Therapy recommendations:  Pending. Ok to be OOB.   Transfer to the floor  Disposition:  pending (admitted from an AL where she has been since hip replacement last Aug)  Atrial Flutter  New onset April 2015 after hip surgery  At that time, demmed not an anticoagulation candidate secondary to advanced age and frequent falls  Patient has had no fall since hip replacement in Aug 2015. Walks with a walker. Is careful per daughter.  Plan NOAC -  eliquis as post tPA imaging neg for hemorrhage. pharmacy to dose  Hypertension  Home meds:   norvasc  Stable  Hyperlipidemia  Home meds:  No statin  LDL 80, goal < 70  Add low dose statin  Continue statin at discharge  Other Stroke Risk Factors  Advanced age  Other Active Problems  Depression/anxiety  Chronic constipation. Resume home meds  Other Pertinent History  Hx atrial flutter post hip surgery 05/2013  Hospital day # 1  Rhoderick Moody [facility] See Amion for Pager information 06/30/2014 9:46 AM   I have personally examined this patient, reviewed notes, independently viewed imaging studies, participated in medical decision making and plan of care. I have made any additions or clarifications directly to the above note. Agree with note above. She presented with transient expressive aphasia likely due to left MCA branch infarct treated with IV TPA with excellent clinical  recovery. She remains at risk for neurological worsening, recurrent stroke and TIA and needs ongoing stroke evaluation and aggressive risk factor control. Maintain strict control of hypertension and close neurological monitoring as well as repeat brain imaging and echocardiogram. I had a long discussion with the patient and family and answered questions. This patient is critically ill and at significant risk of neurological worsening, death and care requires constant monitoring of vital signs, hemodynamics,respiratory and cardiac monitoring,review of multiple databases, neurological assessment, discussion with family, other specialists and medical decision making of high complexity.I have made any additions or clarifications directly to the above note.  I spent 30 minutes of neurocritical care time  in the care of  this patient.  Delia Heady, MD Medical Director [facility] Pager: [phone] 06/30/2014 2:53 PM   To contact Stroke Continuity provider, please refer to  [email or web address]. After hours, contact General Neurology

## 2014-06-30 NOTE — Progress Notes (Signed)
ANTICOAGULATION CONSULT NOTE - Initial Consult  Pharmacy Consult for apixiban Indication: CVA with afib  Allergies  Allergen Reactions  . Flagyl [Metronidazole]     Fingers get "numb"    Patient Measurements: Height: 5\' 1"  (154.9 cm) Weight: 102 lb 1.2 oz (46.3 kg) IBW/kg (Calculated) : 47.8  Vital Signs: Temp: 97.6 F (36.4 C) (05/04 0754) Temp Source: Oral (05/04 0754) BP: 159/69 mmHg (05/04 0800) Pulse Rate: 66 (05/04 0800)  Labs:  Recent Labs  06/29/14 0925 06/29/14 0933  HGB 11.4* 13.6  HCT 34.9* 40.0  PLT 246  --   APTT 28  --   LABPROT 15.4*  --   INR 1.20  --   CREATININE 0.85 0.90    Estimated Creatinine Clearance: 31 mL/min (by C-G formula based on Cr of 0.9).   Medical History: Past Medical History  Diagnosis Date  . Osteoporosis   . Hypertension   . Arthritis     neck  . Depression   . Anxiety   . Dysrhythmia     hx A-FLUTTER after hip surg April 2015- take metoprolol  . Shortness of breath     with activity  . Stroke     "mini stroke" per MRI - no deficiets   . Hip joint pain     RT  . Bruises easily   . Eczema     Medications:  Scheduled:  . amLODipine  5 mg Oral q morning - 10a  . citalopram  30 mg Oral Daily  . docusate sodium  100 mg Oral QHS  . [START ON 07/01/2014] galantamine  8 mg Oral Q breakfast  . LORazepam  0.5 mg Oral QHS  . metoprolol tartrate  50 mg Oral Daily  . metoprolol tartrate  25 mg Oral QHS  . pantoprazole (PROTONIX) IV  40 mg Intravenous QHS  . potassium chloride SA  20 mEq Oral QODAY  . psyllium  1 packet Oral TID  . simvastatin  10 mg Oral q1800  . ziprasidone  80 mg Oral QHS    Assessment: 81 yo female with CVA and known afib. Pharmacy has been consulted to dose apixiban. Patient is noted s/p TPA on 5/2 at 9:55am.  Wt= 46.3kg, SCr= 0.9 and CrCl ~ 30. Patient satisfies criteria for apixiban 2.5mg  po bid with age and weight.   Goal of Therapy:  Monitor platelets by anticoagulation protocol: Yes    Plan:  -apixiban 2.5mg  po bid -Will provide education with family  Harland German, Ilda Basset D 06/30/2014 11:12 AM

## 2014-06-30 NOTE — Progress Notes (Signed)
Echocardiogram 2D Echocardiogram has been performed.  Yolanda Baker 06/30/2014, 11:20 AM

## 2014-06-30 NOTE — Evaluation (Signed)
Physical Therapy Evaluation Patient Details Name: ANNASTASIA HASKINS MRN: [id number] DOB: 05-Dec-1923 Today's Date: 06/30/2014   History of Present Illness  Jiayi Lengacher Herter is an 81 y.o. female who lives in a facility. The patient was noted to awaken normally today. Was able to go to breakfast and ate normally but after breakfast while on her [place] back to her room was noted to have slurred speech and difficulty getting her words out correctly. EMS was called at that time and the patient was brought to the ED. Per EMS the patient cleared while en route but on arrival the patient had return of her speech difficulties.   Clinical Impression  Pt admitted with/for s/s of stroke/ aphasia.  Pt currently limited functionally due to the problems listed below.  (see problems list.)  Pt will benefit from PT to maximize function and safety to be able to get home safely with available assist of staff/family.     Follow Up Recommendations Home health PT;Supervision for mobility/OOB    Equipment Recommendations  None recommended by PT    Recommendations for Other Services       Precautions / Restrictions Precautions Precautions: Fall      Mobility  Bed Mobility Overal bed mobility: Needs Assistance Bed Mobility: Supine to Sit     Supine to sit: Min assist     General bed mobility comments: trunk stability while pt maneuvering herself to EOB  Transfers Overall transfer level: Needs assistance Equipment used: Rolling walker (2 wheeled) Transfers: Sit to/from Stand Sit to Stand: Min assist;+2 safety/equipment         General transfer comment: assist to come forward  Ambulation/Gait Ambulation/Gait assistance: Min assist Ambulation Distance (Feet): 20 Feet Assistive device: Rolling walker (2 wheeled) Gait Pattern/deviations: Step-through pattern;Decreased step length - right;Decreased step length - left Gait velocity: slow   General Gait Details: short mildly unsteady steps, mostly  uncoordinated on the Right  Stairs            Wheelchair Mobility    Modified Rankin (Stroke Patients Only) Modified Rankin (Stroke Patients Only) Pre-Morbid Rankin Score: No significant disability Modified Rankin: Moderately severe disability     Balance Overall balance assessment: Needs assistance Sitting-balance support: Feet supported;No upper extremity supported Sitting balance-Leahy Scale: Fair     Standing balance support: Bilateral upper extremity supported;Single extremity supported Standing balance-Leahy Scale: Poor                               Pertinent Vitals/Pain Pain Assessment: No/denies pain    Home Living Family/patient expects to be discharged to:: Assisted living               Home Equipment: Walker - 2 wheels;Cane - single point Additional Comments: pt assisted with bathing/dressing, but ambulated with RW to bathroom and dining hall.    Prior Function Level of Independence: Needs assistance   Gait / Transfers Assistance Needed: walked independently with RW since IM nail in April, prior to April used [facility]  ADL's / Homemaking Assistance Needed: assist with bathing/dressing        Hand Dominance        Extremity/Trunk Assessment   Upper Extremity Assessment: Defer to OT evaluation           Lower Extremity Assessment: RLE deficits/detail;LLE deficits/detail RLE Deficits / Details: generally weak at 4/5, decreased coordination, jerky movement LLE Deficits / Details: grossly WFL with mild weaknesses proximally  Communication   Communication: No difficulties  Cognition Arousal/Alertness: Awake/alert Behavior During Therapy: WFL for tasks assessed/performed Overall Cognitive Status: Within Functional Limits for tasks assessed                      General Comments      Exercises        Assessment/Plan    PT Assessment Patient needs continued PT services  PT Diagnosis Difficulty  walking;Generalized weakness;Other (comment) (decr activity tolerance)   PT Problem List Decreased strength;Decreased activity tolerance;Decreased balance;Decreased mobility;Decreased coordination  PT Treatment Interventions Gait training;Functional mobility training;Therapeutic activities;DME instruction;Balance training;Neuromuscular re-education;Patient/family education   PT Goals (Current goals can be found in the Care Plan section) Acute Rehab PT Goals Patient Stated Goal: Able to get back to walking myself Time For Goal Achievement: 07/07/14 Potential to Achieve Goals: Good    Frequency Min 3X/week   Barriers to discharge        Co-evaluation               End of Session   Activity Tolerance: Patient tolerated treatment well Patient left: in chair;with call bell/phone within reach;with family/visitor present Nurse Communication: Mobility status         Time: 1530-1601 PT Time Calculation (min) (ACUTE ONLY): 31 min   Charges:   PT Evaluation $Initial PT Evaluation Tier I: 1 Procedure PT Treatments $Gait Training: 8-22 mins   PT G Codes:        Lunette Tapp, Eliseo Gum 06/30/2014, 4:27 PM 06/30/2014  [place] Bing, PT [phone]  (pager)

## 2014-06-30 NOTE — Clinical Social Work Note (Signed)
Clinical Social Work Assessment  Patient Details  Name: Yolanda Baker MRN: [id number] Date of Birth: 10/20/1923  Date of referral:  06/30/14               Reason for consult:  Discharge Planning, Facility Placement                Permission sought to share information with:  Family Supports Permission granted to share information::  Yes, Verbal Permission Granted  Name::     Candy Reavis  Relationship::  Daughter  Contact Information:  [phone]  Housing/Transportation Living arrangements for the past 2 months:  [place] ([place]) Source of Information:  Patient, Adult Children Patient Interpreter Needed:  None Criminal Activity/Legal Involvement Pertinent to Current Situation/Hospitalization:  No - Comment as needed Significant Relationships:  Adult Children Lives with:  Facility Resident Do you feel safe going back to the place where you live?  Yes Need for family participation in patient care:  Yes (Comment)  Care giving concerns:  Patient son and daughter at bedside and are agreeable with patient wishes.  Patient children do not verbalize any concerns regarding patient return to [facility] ALF vs. Possible SNF placement at [facility].   Social Worker assessment / plan:  Holiday representative met with patient and family at bedside to offer support and discuss patient needs at discharge.  Patient states that she is currently at resident at [facility] and would prefer to return at discharge if able.  CSW explained that patient is awaiting PT/OT evaluations to determine if patient is physically able to return.  Patient states that she has been to [facility] twice in the past for short term rehab and would be agreeable to return if unable to discharge directly back to [facility].  CSW has completed FL2 and will await therapy recommendations to determine patient discharge plans.  CSW remains available for support  and to facilitate patient discharge needs once medically stable.  Employment status:  Retired Nurse, adult PT Recommendations:  Not assessed at this time Information / Referral to community resources:  [place]  Patient/Family's Response to care:  Patient and family very appropriate and agreeable with potential discharge options.  Patient and family understanding of CSW role and involvement.  Patient and family hopeful that patient will just be able to return to [facility] at discharge.  Patient/Family's Understanding of and Emotional Response to Diagnosis, Current Treatment, and Prognosis:  Patient understanding of current medical condition and family updated by patient and MD.  Patient and family appropriate and appreciative of support and concern.  Emotional Assessment Appearance:  Appears younger than stated age Attitude/Demeanor/Rapport:    Affect (typically observed):  Accepting, Happy, Hopeful, Appropriate, Calm Orientation:  Oriented to Self, Oriented to Place, Oriented to  Time Alcohol / Substance use:  Not Applicable Psych involvement (Current and /or in the community):  No (Comment)  Discharge Needs  Concerns to be addressed:  Discharge Planning Concerns Readmission within the last 30 days:  Yes Current discharge risk:  Physical Impairment Barriers to Discharge:  Continued Medical Work up, Other (Therapy Evaluations)  Barbette Or, [place]

## 2014-06-30 NOTE — Care Management Note (Signed)
Case Management Note  Patient Details  Name: Yolanda Baker MRN: [id number] Date of Birth: [birth year]  Subjective/Objective:         Stroke; s/p tPA           Action/Plan:  Await therapy evals to determine pt's level of care need at d/c and will need to be sure that her facility of residence can manage her needs.   Expected Discharge Date:  07/02/14               Expected Discharge Plan:  Assisted Living / Rest Home  Vs SNF In-House Referral:  Clinical Social Work       Status of Service:  In process, will continue to follow       Azucena Kuba, RN 06/30/2014, 9:22 AM  [phone]

## 2014-07-01 DIAGNOSIS — I4892 Unspecified atrial flutter: Secondary | ICD-10-CM

## 2014-07-01 DIAGNOSIS — K5900 Constipation, unspecified: Secondary | ICD-10-CM | POA: Diagnosis present

## 2014-07-01 DIAGNOSIS — E785 Hyperlipidemia, unspecified: Secondary | ICD-10-CM | POA: Diagnosis present

## 2014-07-01 LAB — HEMOGLOBIN A1C
Hgb A1c MFr Bld: 5.9 % — ABNORMAL HIGH (ref 4.8–5.6)
Mean Plasma Glucose: 123 mg/dL

## 2014-07-01 MED ORDER — LORAZEPAM 0.5 MG PO TABS: ORAL_TABLET | ORAL | Status: DC

## 2014-07-01 MED ORDER — SIMVASTATIN 10 MG PO TABS: 10.0000 mg | ORAL_TABLET | Freq: Every day | ORAL | Status: DC

## 2014-07-01 MED ORDER — APIXABAN 2.5 MG PO TABS: 2.5000 mg | ORAL_TABLET | Freq: Two times a day (BID) | ORAL | Status: DC

## 2014-07-01 MED ORDER — ZIPRASIDONE HCL 80 MG PO CAPS: 80.0000 mg | ORAL_CAPSULE | Freq: Every day | ORAL | Status: DC

## 2014-07-01 NOTE — Discharge Instructions (Signed)

## 2014-07-01 NOTE — Evaluation (Signed)
Speech Language Pathology Evaluation Patient Details Name: Yolanda Baker MRN: [id number] DOB: Aug 08, 1923 Today's Date: 07/01/2014 Time: 1100-1115 SLP Time Calculation (min) (ACUTE ONLY): 15 min  Problem List:  Patient Active Problem List   Diagnosis Date Noted  . Hyperlipidemia LDL goal <70 07/01/2014  . Constipation chronic 07/01/2014  . Acute CVA (cerebrovascular accident)   . Acute blood loss anemia 10/12/2013  . Postoperative anemia due to acute blood loss 10/04/2013  . Postop Transfusion 10/04/2013  . Closed fracture of femur with nonunion 10/03/2013  . Femur fracture, right 10/03/2013  . Closed femur fracture 10/03/2013  . UTI (urinary tract infection) 06/23/2013  . Dementia 06/09/2013  . Anxiety 06/09/2013  . Atrial flutter 06/07/2013  . Hip fracture 06/04/2013  . Depression 06/04/2013  . Anxiety state 06/04/2013  . HTN (hypertension) 06/04/2013  . Closed right hip fracture 06/04/2013   Past Medical History:  Past Medical History  Diagnosis Date  . Osteoporosis   . Hypertension   . Arthritis     neck  . Depression   . Anxiety   . Dysrhythmia     hx A-FLUTTER after hip surg April 2015- take metoprolol  . Shortness of breath     with activity  . Stroke     "mini stroke" per MRI - no deficiets   . Hip joint pain     RT  . Bruises easily   . Eczema    Past Surgical History:  Past Surgical History  Procedure Laterality Date  . Left hip Left 2008  . Abdominal hysterectomy    . Fracture surgery Right 06/05/2013    hip  . Intramedullary (im) nail intertrochanteric Right 06/05/2013    Procedure: INTRAMEDULLARY (IM) NAIL INTERTROCHANTRIC;  Surgeon: Loanne Drilling, MD;  Location: [facility]S;  Service: Orthopedics;  Laterality: Right;  . Joint replacement      left hip  . Hip arthroplasty Right 10/03/2013    Procedure: CONVERSION OF PREVIOUS RIGHT HIP SURGERY TO A RIGHT TOTAL HIP ARTHROPLASTY;  Surgeon: Loanne Drilling, MD;  Location: [facility];  Service: Orthopedics;   Laterality: Right;   HPI:  81 y.o. resident of assisted living facility admitted with left MCA territory embolic infarct secondary to known atrial fibrillation.  Patient was administered IV TPA and admitted to neuro ICU.     Assessment / Plan / Recommendation Clinical Impression  Pt evaluated with portions of [place] Diagnostic Aphasia Exam; presents with normal fluency, articulation, naming, repetition, and comprehension.  No SLP needs identified - will sign off.     SLP Assessment  Patient does not need any further Speech Lanaguage Pathology Services    Follow Up Recommendations  None            Pertinent Vitals/Pain Pain Assessment: No/denies pain   SLP Goals     SLP Evaluation Prior Functioning  Cognitive/Linguistic Baseline: Within functional limits Type of Home: Assisted living   Cognition  Overall Cognitive Status: Within Functional Limits for tasks assessed    Comprehension  Auditory Comprehension Overall Auditory Comprehension: Appears within functional limits for tasks assessed Yes/No Questions: Within Functional Limits Commands: Within Functional Limits Conversation: Complex Visual Recognition/Discrimination Discrimination: Within Function Limits Reading Comprehension Reading Status: Within funtional limits    Expression Expression Primary Mode of Expression: Verbal Verbal Expression Overall Verbal Expression: Appears within functional limits for tasks assessed Initiation: No impairment Automatic Speech: Day of week;Month of year Level of Generative/Spontaneous Verbalization: Conversation Repetition: No impairment Naming: No impairment Pragmatics: No impairment  Written Expression Dominant Hand: Right Written Expression: Not tested   Oral / Motor Oral Motor/Sensory Function Overall Oral Motor/Sensory Function: Appears within functional limits for tasks assessed Motor Speech Overall Motor Speech: Appears within functional limits for tasks assessed    GO   Cyd Hostler L. Samson Frederic, [place] CCC/SLP Pager [phone]   Blenda Mounts Laurice 07/01/2014, 12:45 PM

## 2014-07-01 NOTE — Clinical Social Work Note (Signed)
Clinical Social Worker facilitated patient discharge including contacting patient family and facility to confirm patient discharge plans.  Clinical information faxed to facility and family agreeable with plan.  CSW arranged transport with patient daughter for patient return to [facility]  RN to call report prior to discharge.  Clinical Social Worker will sign off for now as social work intervention is no longer needed. Please consult [place] again if new need arises.  Macario Golds, [place] [phone]

## 2014-07-01 NOTE — Progress Notes (Addendum)
According to insurance rep, Eliquis is covered at tier 2 pricing, $35.00 for 30-day supply.  Patient can use [facility], [employer], [facility] or [facility] to fill Rx.  I will provide pt with a 30-day free trial card to use, but this is a one-time use card.  **Pt requires preauth before filling.  Prescriber to call [phone].  Pt also has orders for HHPT.  I have spoken with a supervisor at her ALF who states that the contracted provider for their facility is [place].  I have called the hospital rep for [facility] and given the referral for [facility] to her.    Medicare IM not required as pt has been here less than 3 days.   Carlyle Lipa, RN BSN MHA CCM  Case Manager, Trauma Service/Unit 69M [phone]

## 2014-07-01 NOTE — Evaluation (Signed)
Occupational Therapy Evaluation Patient Details Name: Yolanda Baker MRN: [id number] DOB: 1923-05-19 Today's Date: 07/01/2014    History of Present Illness Yolanda Baker is an 81 y.o. female who lives in a facility. The patient was noted to awaken normally today. Was able to go to breakfast and ate normally but after breakfast while on her [place] back to her room was noted to have slurred speech and difficulty getting her words out correctly. EMS was called at that time and the patient was brought to the ED. Per EMS the patient cleared while en route but on arrival the patient had return of her speech difficulties.    Clinical Impression   Pt admitted with the above diagnosis and has the deficits listed below. Pt would benefit from cont OT to increase independence with basic adls and adl transfers at home in areas of grooming and toileting.  Stroke education complete with both pt and daughter in areas of signs and symptoms of CVA. Pt should be able to safely d/c back to ALF.    Follow Up Recommendations  Home health OT;Supervision/Assistance - 24 hour    Equipment Recommendations  None recommended by OT    Recommendations for Other Services       Precautions / Restrictions Precautions Precautions: Fall Restrictions Weight Bearing Restrictions: No      Mobility Bed Mobility               General bed mobility comments: Pt in chair on arrival.  Transfers Overall transfer level: Needs assistance Equipment used: Rolling walker (2 wheeled) Transfers: Sit to/from [employer] Sit to Stand: Min assist Stand pivot transfers: Min assist       General transfer comment: assist to come forward    Balance Overall balance assessment: Needs assistance Sitting-balance support: Feet supported Sitting balance-Leahy Scale: Fair     Standing balance support: Bilateral upper extremity supported;During functional activity Standing balance-Leahy Scale:  Poor Standing balance comment: Pt must have outside support to stand.                            ADL Overall ADL's : Needs assistance/impaired Eating/Feeding: Set up;Sitting   Grooming: Set up;Sitting   Upper Body Bathing: Minimal assitance;Sitting   Lower Body Bathing: Moderate assistance;Sit to/from stand   Upper Body Dressing : Minimal assistance;Sitting   Lower Body Dressing: Maximal assistance;Sit to/from stand   Toilet Transfer: Minimal assistance;BSC;Ambulation;RW Toilet Transfer Details (indicate cue type and reason): Pt walked to bathroom with min guard.  Pt has difficulty powering up to stand.  Uses a left chair in her room but normally can get off of the commode w/o assist and now needs help. Toileting- Clothing Manipulation and Hygiene: Maximal assistance;Sit to/from stand Toileting - Clothing Manipulation Details (indicate cue type and reason): pt held to walker while therapist attached depends. pt uses depends all the time at home.     Functional mobility during ADLs: Minimal assistance General ADL Comments: Pt mostl likely is very close to baseline with her adls. Pt has difficulty coming from sit to stand but once in standing does well.  Pt had assist PTA for bathing/dressing.     Vision Vision Assessment?: No apparent visual deficits   Perception Perception Perception Tested?: Yes   Praxis Praxis Praxis tested?: Within functional limits    Pertinent Vitals/Pain Pain Assessment: No/denies pain     Hand Dominance Right   Extremity/Trunk Assessment Upper Extremity Assessment Upper  Extremity Assessment: Generalized weakness   Lower Extremity Assessment Lower Extremity Assessment: Defer to PT evaluation   Cervical / Trunk Assessment Cervical / Trunk Assessment: Kyphotic   Communication Communication Communication: No difficulties   Cognition Arousal/Alertness: Awake/alert Behavior During Therapy: WFL for tasks assessed/performed Overall  Cognitive Status: Within Functional Limits for tasks assessed                     General Comments       Exercises       Shoulder Instructions      Home Living Family/patient expects to be discharged to:: Assisted living                             Home Equipment: Walker - 2 wheels;Cane - single point;Shower seat;Grab bars - toilet;Grab bars - tub/shower;Other (comment) (lift chair)   Additional Comments: pt assisted with bathing/dressing, but ambulated with RW to bathroom and dining hall.      Prior Functioning/Environment Level of Independence: Needs assistance  Gait / Transfers Assistance Needed: walked independently with RW since IM nail in April, prior to April used [facility] ADL's / Homemaking Assistance Needed: assist with bathing/dressing/getting into shower/changing depends.  Also has a lift chair she regularly uses.        OT Diagnosis: Generalized weakness   OT Problem List: Decreased strength;Impaired balance (sitting and/or standing);Decreased knowledge of use of DME or AE   OT Treatment/Interventions: Self-care/ADL training;DME and/or AE instruction;Therapeutic activities;Balance training    OT Goals(Current goals can be found in the care plan section) Acute Rehab OT Goals Patient Stated Goal: Able to get back to walking myself OT Goal Formulation: With patient/family Time For Goal Achievement: 07/15/14 Potential to Achieve Goals: Good ADL Goals Pt Will Perform Grooming: with modified independence;standing Pt Will Transfer to Toilet: with supervision;ambulating;regular height toilet;grab bars Pt Will Perform Toileting - Clothing Manipulation and hygiene: with min assist;sit to/from stand Additional ADL Goal #1: Pt will walk in room to retreive adl items with S.  OT Frequency: Min 2X/week   Barriers to D/C:    Pt in ALF with adequate assist.       Co-evaluation              End of Session Equipment Utilized During Treatment:  Rolling walker Nurse Communication: Mobility status  Activity Tolerance: Patient tolerated treatment well Patient left: in chair;with call bell/phone within reach;with family/visitor present   Time: 1025-1056 OT Time Calculation (min): 31 min Charges:  OT General Charges $OT Visit: 1 Procedure OT Evaluation $Initial OT Evaluation Tier I: 1 Procedure OT Treatments $Self Care/Home Management : 8-22 mins G-Codes:    Hope Budds Jul 15, 2014, 11:11 AM  [phone]

## 2014-07-01 NOTE — Discharge Summary (Addendum)
Stroke Discharge Summary  Patient ID: Yolanda Baker   MRN: [id number]      DOB: Dec 14, 1923  Date of Admission: 06/29/2014 Date of Discharge: 07/01/2014  Attending Physician:  Micki Riley, MD, Stroke MD  Consulting Physician(s):      none Patient's PCP:  Ezequiel Kayser, MD  DISCHARGE DIAGNOSIS:  Principal Problem:   Acute CVA (cerebrovascular accident) s/p IV tPA Active Problems:   Depression   Anxiety state   HTN (hypertension)   Atrial flutter   Dementia   Anxiety   Hyperlipidemia LDL goal <70   Constipation chronic  BMI: Body mass index is 19.3 kg/(m^2).  Past Medical History  Diagnosis Date  . Osteoporosis   . Hypertension   . Arthritis     neck  . Depression   . Anxiety   . Dysrhythmia     hx A-FLUTTER after hip surg April 2015- take metoprolol  . Shortness of breath     with activity  . Stroke     "mini stroke" per MRI - no deficiets   . Hip joint pain     RT  . Bruises easily   . Eczema    Past Surgical History  Procedure Laterality Date  . Left hip Left 2008  . Abdominal hysterectomy    . Fracture surgery Right 06/05/2013    hip  . Intramedullary (im) nail intertrochanteric Right 06/05/2013    Procedure: INTRAMEDULLARY (IM) NAIL INTERTROCHANTRIC;  Surgeon: Loanne Drilling, MD;  Location: [facility];  Service: Orthopedics;  Laterality: Right;  . Joint replacement      left hip  . Hip arthroplasty Right 10/03/2013    Procedure: CONVERSION OF PREVIOUS RIGHT HIP SURGERY TO A RIGHT TOTAL HIP ARTHROPLASTY;  Surgeon: Loanne Drilling, MD;  Location: [facility];  Service: Orthopedics;  Laterality: Right;      Medication List    STOP taking these medications        acetaminophen 500 MG chewable tablet  Commonly known as:  TYLENOL     aspirin 81 MG chewable tablet     enoxaparin 40 MG/0.4ML injection  Commonly known as:  LOVENOX     HYDROcodone-acetaminophen 5-325 MG per tablet  Commonly known as:  NORCO/VICODIN     iron polysaccharides 150 MG capsule   Commonly known as:  NIFEREX     methocarbamol 500 MG tablet  Commonly known as:  ROBAXIN     ondansetron 4 MG tablet  Commonly known as:  ZOFRAN     polyethylene glycol packet  Commonly known as:  MIRALAX / GLYCOLAX     traMADol 50 MG tablet  Commonly known as:  ULTRAM      TAKE these medications        amLODipine 5 MG tablet  Commonly known as:  NORVASC  Take 5 mg by mouth every morning.     antiseptic oral rinse Liqd  15 mLs by Mouth Rinse route 2 (two) times daily as needed for dry mouth.     apixaban 2.5 MG Tabs tablet  Commonly known as:  ELIQUIS  Take 1 tablet (2.5 mg total) by mouth 2 (two) times daily.     Biotin 5 MG Caps  Take 5 mg by mouth daily.     bisacodyl 5 MG EC tablet  Commonly known as:  DULCOLAX  Take 5 mg by mouth daily as needed for moderate constipation.     CERTAVITE SENIOR/ANTIOXIDANT PO  Take 1 capsule by  mouth daily.     citalopram 20 MG tablet  Commonly known as:  CELEXA  Take 30 mg by mouth daily.     CITRACAL PO  Take 1 capsule by mouth daily.     docusate sodium 100 MG capsule  Commonly known as:  COLACE  Take 100 mg by mouth at bedtime.     galantamine 8 MG 24 hr capsule  Commonly known as:  RAZADYNE ER  Take 8 mg by mouth daily with breakfast.     LORazepam 0.5 MG tablet  Commonly known as:  ATIVAN  Take one tablet by mouth every night at bedtime for rest     metoprolol tartrate 25 MG tablet  Commonly known as:  LOPRESSOR  Take 25-50 mg by mouth 2 (two) times daily. 2 in the morning and 1 at night     polyvinyl alcohol 1.4 % ophthalmic solution  Commonly known as:  LIQUIFILM TEARS  Place 1 drop into both eyes 3 (three) times daily as needed for dry eyes.     potassium chloride SA 20 MEQ tablet  Commonly known as:  K-DUR,KLOR-CON  Take 20 mEq by mouth every other day.     psyllium 58.6 % powder  Commonly known as:  METAMUCIL  Take 1 packet by mouth 2 (two) times daily as needed (Constipation). Pt takes 1  tablespoon at each dose     simvastatin 10 MG tablet  Commonly known as:  ZOCOR  Take 1 tablet (10 mg total) by mouth daily at 6 PM.     ziprasidone 80 MG capsule  Commonly known as:  GEODON  Take 1 capsule (80 mg total) by mouth at bedtime.        LABORATORY STUDIES CBC    Component Value Date/Time   WBC 7.1 06/29/2014 0925   RBC 3.74* 06/29/2014 0925   HGB 13.6 06/29/2014 0933   HCT 40.0 06/29/2014 0933   PLT 246 06/29/2014 0925   MCV 93.3 06/29/2014 0925   MCH 30.5 06/29/2014 0925   MCHC 32.7 06/29/2014 0925   RDW 14.4 06/29/2014 0925   LYMPHSABS 2.1 06/29/2014 0925   MONOABS 0.9 06/29/2014 0925   EOSABS 0.1 06/29/2014 0925   BASOSABS 0.0 06/29/2014 0925   CMP    Component Value Date/Time   NA 135 06/29/2014 0933   K 4.0 06/29/2014 0933   CL 97* 06/29/2014 0933   CO2 27 06/29/2014 0925   GLUCOSE 86 06/29/2014 0933   BUN 17 06/29/2014 0933   CREATININE 0.90 06/29/2014 0933   CALCIUM 8.9 06/29/2014 0925   PROT 7.0 06/29/2014 0925   ALBUMIN 3.4* 06/29/2014 0925   AST 56* 06/29/2014 0925   ALT 38 06/29/2014 0925   ALKPHOS 79 06/29/2014 0925   BILITOT 0.8 06/29/2014 0925   GFRNONAA 59* 06/29/2014 0925   GFRAA >60 06/29/2014 0925   COAGS Lab Results  Component Value Date   INR 1.20 06/29/2014   INR 1.18 10/02/2013   INR 1.08 06/04/2013   Lipid Panel    Component Value Date/Time   CHOL 148 06/30/2014 0246   TRIG 43 06/30/2014 0246   HDL 59 06/30/2014 0246   CHOLHDL 2.5 06/30/2014 0246   VLDL 9 06/30/2014 0246   LDLCALC 80 06/30/2014 0246   HgbA1C  Lab Results  Component Value Date   HGBA1C 5.9* 06/30/2014   Cardiac Panel (last 3 results) No results for input(s): CKTOTAL, CKMB, TROPONINI, RELINDX in the last 72 hours. Urinalysis    Component Value Date/Time  COLORURINE YELLOW 06/29/2014 0943   APPEARANCEUR CLEAR 06/29/2014 0943   LABSPEC 1.008 06/29/2014 0943   PHURINE 7.5 06/29/2014 0943   GLUCOSEU NEGATIVE 06/29/2014 0943   HGBUR  NEGATIVE 06/29/2014 0943   BILIRUBINUR NEGATIVE 06/29/2014 0943   KETONESUR NEGATIVE 06/29/2014 0943   PROTEINUR NEGATIVE 06/29/2014 0943   UROBILINOGEN 0.2 06/29/2014 0943   NITRITE NEGATIVE 06/29/2014 0943   LEUKOCYTESUR SMALL* 06/29/2014 0943   Urine Drug Screen     Component Value Date/Time   LABOPIA NONE DETECTED 06/29/2014 0943   COCAINSCRNUR NONE DETECTED 06/29/2014 0943   LABBENZ NONE DETECTED 06/29/2014 0943   AMPHETMU NONE DETECTED 06/29/2014 0943   THCU NONE DETECTED 06/29/2014 0943   LABBARB NONE DETECTED 06/29/2014 0943    Alcohol Level    Component Value Date/Time   ETH <5 06/29/2014 0925     SIGNIFICANT DIAGNOSTIC STUDIES Ct Head Wo Contrast 06/29/2014 No acute intracranial abnormality. Stable atrophy and chronic white matter disease. Stable remote infarct in left lenticular nucleus and left corona radiata. No definite acute cortical infarction.   Mr Brain Wo Contrast 06/30/2014 1. Two small acute nonhemorrhagic infarcts in the left MCA territory, present along the mid sylvian fissure and parietal lobe. 2. Chronic small vessel disease and remote subcortical lacunar infarcts on the left. 3. Generalized brain atrophy, progressed from 2012.  [name] 06/30/2014 4. Mild intracranial atherosclerosis. No acute intracranial arterial finding or treatable stenosis.  Carotid Doppler  Right: 1-39% ICA stenosis. Left: >80% internal carotid artery stenosis by peak systolic velocities and ICA/CCA ratio, probably due to severe vessel tortuosity. The end diastolic velocities are in the 40-59% range. Bilateral: Vertebral artery flow is antegrade.   2D Echocardiogram   - Left ventricle: The cavity size was normal. Systolic function wasnormal. The estimated ejection fraction was in the range of 55%to 60%. Wall motion was normal; there were no regional wallmotion abnormalities. There was a reduced contribution of atrialcontraction to ventricular filling,  due to increased ventriculardiastolic pressure or atrial contractile dysfunction. Dopplerparameters are consistent with a reversible restrictive pattern,indicative of decreased left ventricular diastolic complianceand/or increased left atrial pressure (grade 3 diastolicdysfunction). - Mitral valve: There was mild regurgitation. - Left atrium: The atrium was mildly dilated. - Tricuspid valve: There was mild-moderate regurgitation. - Pulmonic valve: There was trivial regurgitation. - Pulmonary arteries: PA peak pressure: 33 mm Hg (S).     HISTORY OF PRESENT ILLNESS  Shantay Eighmey Foote is an 81 y.o. female who lives in a facility. The patient was noted to awaken normally today. Was able to go to breakfast and ate normally but after breakfast while on her Dani back to her room was noted to have slurred speech and difficulty getting her words out correctly.LKW 06/29/2014 at 0815. EMS was called at that time and the patient was brought to the ED. Per EMS the patient cleared while en route but on arrival the patient had return of her speech difficulties. At baseline the patient walks with a walker and requires assistance for dressing. She is not incontinent and handles her own toileting needs She is able to feed herself as well. Pre-admission mRankin: 3 Patient was administered IV TPA. She was admitted to the neuro ICU for further evaluation and treatment (4N without staffing).   HOSPITAL COURSE Stroke: left MCA territory embolic infarct secondary to known atrial fibrillation   Post tPA oral bleeding resolved. Amount insignificant. Etiology of bleeding unknown - no recent surgery, no loose teeth, etc.  Resultant Expressive aphasia  resolved  MRI left MCA territory embolic infarcts - mid sylvian fissure and parietal lobe  MRA Unremarkable   Carotid Doppler L40-59% stenosis  2D Echo No source of embolus   HgbA1c 5.9  aspirin 81 mg orally every day prior to admission, given stroke  with hx of atrial flutter, started on Eliquis. 2.5 mg bid  Ongoing aggressive stroke risk factor management  Therapy recommendations: HH PT  Disposition: return to AL at [facility] with [facility] follow up (admitted from an [facility] where she has been since hip replacement last Aug)  Atrial Flutter  New onset April 2015 after hip surgery  At that time, demmed not an anticoagulation candidate secondary to advanced age and frequent falls  Patient has had no fall since hip replacement in Aug 2015. Walks with a walker. Is careful per daughter.  Started on NOAC - eliquis 2.5 mg bid   Hypertension  Home meds: norvasc resumed  Stable  Hyperlipidemia  Home meds: No statin  LDL 80, goal < 70  Added low dose statin  Continue statin at discharge  Other Stroke Risk Factors  Advanced age  Other Active Problems  Depression/anxiety  Chronic constipation. Resume home meds   DISCHARGE EXAM Blood pressure 156/62, pulse 67, temperature 98.6 F (37 C), temperature source Oral, resp. rate 16, height  (1.549 m), weight 46.3 kg (102 lb 1.2 oz), SpO2 100 %. Frail elderly Caucasian lady not in distress. . Afebrile. Head is nontraumatic. Neck is supple without bruit. Cardiac exam no murmur or gallop. Lungs are clear to auscultation. Distal pulses are well felt. Neurological Exam : Awake alert oriented 3 with normal speech and language. Fluent speech without any paraphasias or word hesitation. Able to name repeat and comprehend quite well. Extraocular movements are full range without nystagmus. Vision acuity seems adequate. Fundi were not visualized. Pupils are equal reactive. Extraocular movements are full range without nystagmus. Blinks to threat bilaterally. Face is symmetric without weakness. Tongue is midline. Motor system exam reveals symmetric and equal strength in all 4 extremities without focal weakness. Deep tendon reflexes are 2+ symmetric. Plantars are  downgoing. Sensation is intact bilaterally. Coordination is accurate. Gait was not tested. NIHSS 0  Discharge Diet   Diet Heart Room service appropriate?: Yes; Fluid consistency:: Thin liquids  DISCHARGE PLAN  Disposition:  Return to AL at [facility] with [facility] PT   eliquis (apixaban) for secondary stroke prevention (prior authorization obtained from [employer] good through 07/01/2015, #[id number])  Follow up carotid doppler in 6 months  Follow-up PERINI,MARK A, MD in 2 weeks.  Follow-up with Dr. Delia Heady, Stroke Clinic in 2 months.  40 minutes were spent preparing discharge.  Rhoderick Moody [facility] See Amion for Pager information 07/01/2014 12:58 PM   I have personally examined this patient, reviewed notes, independently viewed imaging studies, participated in medical decision making and plan of care. I have made any additions or clarifications directly to the above note. Agree with note above.   Delia Heady, MD Medical Director [facility] Pager: [phone] 07/01/2014 1:38 PM

## 2014-09-21 ENCOUNTER — Encounter: Payer: Self-pay | Admitting: Neurology

## 2014-09-21 ENCOUNTER — Ambulatory Visit (INDEPENDENT_AMBULATORY_CARE_PROVIDER_SITE_OTHER): Payer: Medicare Other | Admitting: Neurology

## 2014-09-21 VITALS — BP 142/66 | HR 68 | Resp 14

## 2014-09-21 DIAGNOSIS — Z8673 Personal history of transient ischemic attack (TIA), and cerebral infarction without residual deficits: Secondary | ICD-10-CM

## 2014-09-21 DIAGNOSIS — R258 Other abnormal involuntary movements: Secondary | ICD-10-CM

## 2014-09-21 DIAGNOSIS — F800 Phonological disorder: Secondary | ICD-10-CM | POA: Diagnosis not present

## 2014-09-21 NOTE — Progress Notes (Signed)
Subjective:    Patient ID: Yolanda Baker is a 81 y.o. female.  HPI    Yolanda Foley, MD, PhD [facility] [address] [address] [place], [place] [address]  [name] is an 81 year old right-handed woman with an underlying medical history of hypertension, arthritis, depression, anxiety, atrial flutter, right hip fracture, status post surgery in April 2015, status post left hip replacement surgery, who presents for initial consultation after a recent hospitalization for acute stroke. The patient is accompanied by her oldest daughter, Yolanda Baker, today. She presented to the emergency room on 06/29/2014 with sudden onset of slurred speech. At baseline, the patient walks with a walker and requires assistance for some of her ADLs. A code stroke was called and TPA was administered. The patient was transferred to the neuro ICU for monitoring and further evaluation and treatment. I reviewed the hospital records. MRI showed left MCA territory embolic infarcts. MRA head was unremarkable. Carotid Doppler studies showed left-sided 40-59% ICA stenosis, echocardiogram showed no source of embolus. Hemoglobin A1c was 5.9. History of atrial flutter prompted starting the patient on Eliquis. She was residing at [facility] assisted living. She was discharged on 07/01/2014 with home health physical therapy. Low-dose statin was added because of LDL slightly above goal at 80. I personally reviewed her brain MRI from 06/30/2014 and agree with the findings: 1. Two small acute nonhemorrhagic infarcts in the left MCA territory, present along the mid sylvian fissure and parietal lobe.  2. Chronic small vessel disease and remote subcortical lacunar infarcts on the left. 3. Generalized brain atrophy, progressed from 2012. 4. Mild intracranial atherosclerosis. No acute intracranial arterial finding or treatable stenosis.  Of note, she has been on Geodon for years, she is followed by Dr.  Meredith Baker at [facility]. Her husband passed away in 05-Jul-2022. She has been in Assisted Living since 2015. She has three grown children and 6 grandchildren and 8 great grandchildren.   She now mobilizes with a non-wheeled walker, she has no problems swallowing and speech has recovered per patient and her daughter feels, she is doing rather well.   She is in a liftchair a lot, she goes to the sit-down exercise classes about 5 times a week.   She does not snore. No apneas reported by daughter. She has been on Galantamine for some years.    Her Past Medical History Is Significant For: Past Medical History  Diagnosis Date  . Osteoporosis   . Hypertension   . Arthritis     neck  . Depression   . Anxiety   . Dysrhythmia     hx A-FLUTTER after hip surg April 2015- take metoprolol  . Shortness of breath     with activity  . Stroke     "mini stroke" per MRI - no deficiets   . Hip joint pain     RT  . Bruises easily   . Eczema   . Memory loss     Her Past Surgical History Is Significant For: Past Surgical History  Procedure Laterality Date  . Left hip Left 2008  . Abdominal hysterectomy    . Fracture surgery Right 06/05/2013    hip  . Intramedullary (im) nail intertrochanteric Right 06/05/2013    Procedure: INTRAMEDULLARY (IM) NAIL INTERTROCHANTRIC;  Surgeon: Yolanda Drilling, MD;  Location: [facility];  Service: Orthopedics;  Laterality: Right;  . Joint replacement      left hip  . Hip arthroplasty Right 10/03/2013  Procedure: CONVERSION OF PREVIOUS RIGHT HIP SURGERY TO A RIGHT TOTAL HIP ARTHROPLASTY;  Surgeon: Yolanda Drilling, MD;  Location: [facility];  Service: Orthopedics;  Laterality: Right;  . Leg surgery Right     x2    Her Family History Is Significant For: Family History  Problem Relation Age of Onset  . Heart attack Father   . Hypertension Father   . Hypertension Sister   . Hypertension Brother     Her Social History Is Significant For: History   Social History  .  Marital Status: Widowed    Spouse Name: N/A  . Number of Children: 3  . Years of Education: 28yr colleg   Occupational History  . Retired     Social History Main Topics  . Smoking status: Never Smoker   . Smokeless tobacco: Never Used  . Alcohol Use: No  . Drug Use: No  . Sexual Activity: No   Other Topics Concern  . None   Social History Narrative   Drinks about 3 cups of coffee a day     Her Allergies Are:  Allergies  Allergen Reactions  . Flagyl [Metronidazole]     Fingers get "numb"  :   Her Current Medications Are:  Outpatient Encounter Prescriptions as of 09/21/2014  Medication Sig  . acetaminophen (TYLENOL) 500 MG tablet Take 500 mg by mouth 2 (two) times daily.  Marland Kitchen amLODipine (NORVASC) 5 MG tablet Take 5 mg by mouth every morning.  Marland Kitchen antiseptic oral rinse (BIOTENE) LIQD 15 mLs by Mouth Rinse route 2 (two) times daily as needed for dry mouth.  Marland Kitchen apixaban (ELIQUIS) 2.5 MG TABS tablet Take 1 tablet (2.5 mg total) by mouth 2 (two) times daily.  . Biotin 5 MG CAPS Take 5 mg by mouth daily.  . bisacodyl (DULCOLAX) 5 MG EC tablet Take 5 mg by mouth daily as needed for moderate constipation.  . Calcium Citrate (CITRACAL PO) Take 1 capsule by mouth daily.  . citalopram (CELEXA) 20 MG tablet Take 30 mg by mouth daily.  Marland Kitchen docusate sodium (COLACE) 100 MG capsule Take 100 mg by mouth at bedtime.  Marland Kitchen galantamine (RAZADYNE ER) 8 MG 24 hr capsule Take 8 mg by mouth daily with breakfast.  . LORazepam (ATIVAN) 0.5 MG tablet Take one tablet by mouth every night at bedtime for rest  . metoprolol tartrate (LOPRESSOR) 25 MG tablet Take 25-50 mg by mouth 2 (two) times daily. 2 in the morning and 1 at night  . Multiple Vitamins-Minerals (CERTAVITE SENIOR/ANTIOXIDANT PO) Take 1 capsule by mouth daily.  . polyvinyl alcohol (LIQUIFILM TEARS) 1.4 % ophthalmic solution Place 1 drop into both eyes 3 (three) times daily as needed for dry eyes.  . potassium chloride SA (K-DUR,KLOR-CON) 20 MEQ  tablet Take 20 mEq by mouth every other day.  . psyllium (METAMUCIL) 58.6 % powder Take 1 packet by mouth 2 (two) times daily as needed (Constipation). Pt takes 1 tablespoon at each dose  . simvastatin (ZOCOR) 10 MG tablet Take 1 tablet (10 mg total) by mouth daily at 6 PM.  . traMADol (ULTRAM) 50 MG tablet   . ziprasidone (GEODON) 80 MG capsule Take 1 capsule (80 mg total) by mouth at bedtime.   No facility-administered encounter medications on file as of 09/21/2014.  :   Review of Systems:  Out of a complete 14 point review of systems, all are reviewed and negative with the exception of these symptoms as listed below:   Review of Systems  Neurological: Positive for weakness.       CVA 06/29/14.   Psychiatric/Behavioral:       Depression     Objective:  Neurologic Exam  Physical Exam Physical Examination:   Filed Vitals:   09/21/14 0927  BP: 142/66  Pulse: 68  Resp: 14   General Examination: The patient is a very pleasant 81 y.o. female in no acute distress. She appears mildly frail and deconditioned. She is well groomed. She is situated in a wheelchair. She did not bring her walker today.  HEENT: Normocephalic, atraumatic, pupils are equal, round and reactive to light and accommodation. Funduscopic exam is normal with sharp disc margins noted. Extraocular tracking is good without limitation to gaze excursion or nystagmus noted. Normal smooth pursuit is noted. Hearing is grossly intact. Tympanic membranes are clear bilaterally. Face is symmetric with normal facial animation and normal facial sensation. Speech is clear with no overt dysarthria noted, but she has mild hypophonia and mildly slow speech. She may have minimal dysarthria. There is no lip, neck/head, jaw or voice tremor. Neck is supple with full range of passive and active motion. There are no carotid bruits on auscultation. Oropharynx exam reveals: moderate mouth dryness, adequate dental hygiene and mild airway crowding,  due to narrow airway entry and redundant soft palate. Mallampati is class II. Tongue protrudes centrally and palate elevates symmetrically.   Chest: Clear to auscultation without wheezing, rhonchi or crackles noted.  Heart: S1+S2+0, regular and normal without murmurs, rubs or gallops noted.   Abdomen: Soft, non-tender and non-distended with normal bowel sounds appreciated on auscultation.  Extremities: There is trace pitting edema in the distal lower extremities bilaterally. Pedal pulses are intact.  Skin: Warm and dry without trophic changes noted. There are no varicose veins. She has mild bruising on her forearms and distal legs.  Musculoskeletal: exam reveals no obvious joint deformities, tenderness or joint swelling or erythema, with the exception of decrease in range of motion in both hips.  Neurologically:  Mental status: The patient is awake, alert and oriented in all 4 spheres. Her immediate and remote memory, attention, language skills and fund of knowledge are fairly appropriate. There is no evidence of aphasia, agnosia, apraxia or anomia. Speech is as described above. Thought process is linear. Mood is normal and affect is normal.  Cranial nerves II - XII are as described above under HEENT exam. In addition: shoulder shrug is normal with equal shoulder height noted. Motor exam: she has a thin bulk, and global strength of 4+ out of 5. She has no drift and no tremor is noted. Romberg is not tested. Reflexes are 2+ throughout. Babinski: Toes are flexor bilaterally. Fine motor skills and coordination: she has mild difficulty globally with fine motor skills. She has some slowness in her movements. Cerebellar testing: No dysmetria or intention tremor on finger to nose testing. Heel to shin is unremarkable bilaterally. There is no truncal or gait ataxia.  Sensory exam: intact to light touch, pinprick, vibration, temperature sense in the upper and lower extremities, with the exception of mild  decrease in pinprick and temperature sense in the right upper extremity.  Gait, station and balance:she did not bring her walker today and it was not safe to stand or walk her.  Assessment and Plan:   In summary, Kamore Sandoe Hasting is a very pleasant 81 y.o.-year old female with an underlying medical history of hypertension, arthritis, depression, anxiety, atrial flutter, right hip fracture, status post surgery in April 2015, status  post left hip replacement surgery, who presents for initial consultation after a recent hospitalization for acute stroke. The patient presented on 06/29/2014 with sudden onset of slurring of speech and word finding difficulties witnessed by an attendant at her assisted living facility. She is status post t-PA and for her history of atrial flutter she has been placed on Eliquis and she was also started on low-dose simvastatin for an LDL that was below goal of 70. The patient is doing reasonably well at this time. She mobilizes with a non-rolling walker at her assisted living facility. I suggested continuation of current management and reminded the patient not to try to transfer or walk without her walker due to fall risk. I talked to the patient and her daughter about secondary stroke prevention today. We reviewed test results from her hospitalization. I suggested a 3-4 month follow-up with Dr. Pearlean Brownie.  On examination, the patient has mild global weakness and right upper extremity decrease in sensation. She has slight slowness in her movements, and a mildly low volume speech and mildly slow speech. While she does not have any overt parkinsonism, she has been on Geodon for years. I answered all their questions today and the patient and her daughter were in agreement.

## 2014-09-21 NOTE — Patient Instructions (Addendum)
We will continue your medications.   Continue exercising regularly and take your medications as directed. As discussed, secondary prevention is key after a stroke. This means: taking care of blood sugar values or diabetes management, good blood pressure (hypertension) control and optimizing cholesterol management, exercising daily or regularly within your own mobility limitations of course, and overall cardiovascular risk factor reduction, which includes screening for and treatment of obstructive sleep apnea (OSA) and weight management, as appropriate.   We will have you follow up with Dr. Pearlean Brownie in about 3-4 months.

## 2014-10-26 ENCOUNTER — Encounter (HOSPITAL_COMMUNITY): Payer: Self-pay

## 2014-10-26 ENCOUNTER — Ambulatory Visit (HOSPITAL_COMMUNITY)
Admission: RE | Admit: 2014-10-26 | Discharge: 2014-10-26 | Disposition: A | Discharge status: Home / Self Care | Payer: Medicare Other | Source: Ambulatory Visit | Attending: Internal Medicine | Admitting: Internal Medicine

## 2014-10-26 DIAGNOSIS — M810 Age-related osteoporosis without current pathological fracture: Secondary | ICD-10-CM | POA: Diagnosis not present

## 2014-10-26 MED ORDER — DENOSUMAB 60 MG/ML ~~LOC~~ SOLN
60.0000 mg | Freq: Once | SUBCUTANEOUS | Status: AC
  Administered 2014-10-26: 60 mg via SUBCUTANEOUS
  Filled 2014-10-26: qty 1

## 2014-11-30 ENCOUNTER — Other Ambulatory Visit: Payer: Self-pay

## 2014-11-30 DIAGNOSIS — Z1231 Encounter for screening mammogram for malignant neoplasm of breast: Secondary | ICD-10-CM

## 2015-01-05 ENCOUNTER — Encounter: Payer: Self-pay | Admitting: Neurology

## 2015-01-05 ENCOUNTER — Ambulatory Visit (INDEPENDENT_AMBULATORY_CARE_PROVIDER_SITE_OTHER): Payer: Medicare Other | Admitting: Neurology

## 2015-01-05 VITALS — BP 116/70 | HR 61 | Ht 62.0 in | Wt 106.0 lb

## 2015-01-05 DIAGNOSIS — I63412 Cerebral infarction due to embolism of left middle cerebral artery: Secondary | ICD-10-CM | POA: Diagnosis not present

## 2015-01-05 NOTE — Progress Notes (Signed)
[facility] [address] [place]. [place] [address] [phone]       OFFICE FOLLOW-UP NOTE  [name] Date of Birth:  November 25, 1923 Medical Record Number:  [id number]   HPI: 81 year old Caucasian lady seen today for follow-up following a stroke in May 2016.patient is accompanied by her oldest daughter, [name], today. She presented to the emergency room on 06/29/2014 with sudden onset of slurred speech. At baseline, the patient walks with a walker and requires assistance for some of her ADLs. A code stroke was called and TPA was administered. The patient was transferred to the neuro ICU for monitoring and further   treatment. I reviewed the hospital records. MRI showed left MCA territory small frontal and parietal embolic infarcts. MRA head was unremarkable. Carotid Doppler studies showed left-sided 40-59% ICA stenosis, echocardiogram showed no source of embolus. Hemoglobin A1c was 5.9. History of atrial flutter prompted starting the patient on Eliquis. She was residing at [facility] assisted living. She was discharged on 07/01/2014 with home health physical therapy. Low-dose statin was added because of LDL slightly above goal at 80. She was seen on follow-up by Dr. Frances Furbish on 09/21/14 and present treatment plan was continued. She states she's done well since then. She is had no recurrent stroke or TIA symptoms. She still mostly independent in activities of daily living and walks with a walker. Her memory is good and she has been on current dose of Razadyne for several years She feels she is back to her pre-stroke baseline. Her blood pressure is well controlled and today it is 116/70. She is tolerating eliquis quite well with only minor bruising. She is very careful with her walking and has not had any falls.  ROS:   14 system review of systems is positive for  eye itching, neck pain, skin moles, anxiety, depression, nervousness and all other systems negative PMH:  Past  Medical History  Diagnosis Date  . Osteoporosis   . Hypertension   . Arthritis     neck  . Depression   . Anxiety   . Dysrhythmia     hx A-FLUTTER after hip surg April 2015- take metoprolol  . Shortness of breath     with activity  . Stroke [facility])     "mini stroke" per MRI - no deficiets   . Hip joint pain     RT  . Bruises easily   . Eczema   . Memory loss     Social History:  Social History   Social History  . Marital Status: Widowed    Spouse Name: N/A  . Number of Children: 3  . Years of Education: 81yr colleg   Occupational History  . Retired     Social History Main Topics  . Smoking status: Never Smoker   . Smokeless tobacco: Never Used  . Alcohol Use: No  . Drug Use: No  . Sexual Activity: No   Other Topics Concern  . Not on file   Social History Narrative   Drinks about 3 cups of coffee a day     Medications:   Current Outpatient Prescriptions on File Prior to Visit  Medication Sig Dispense Refill  . acetaminophen (TYLENOL) 500 MG tablet Take 500 mg by mouth 2 (two) times daily.    Marland Kitchen amLODipine (NORVASC) 5 MG tablet Take 5 mg by mouth every morning.    Marland Kitchen antiseptic oral rinse (BIOTENE) LIQD 15 mLs by Mouth Rinse route 2 (two) times daily as needed  for dry mouth.    Marland Kitchen apixaban (ELIQUIS) 2.5 MG TABS tablet Take 1 tablet (2.5 mg total) by mouth 2 (two) times daily. 60 tablet 2  . Biotin 5 MG CAPS Take 5 mg by mouth daily.    . bisacodyl (DULCOLAX) 5 MG EC tablet Take 5 mg by mouth daily as needed for moderate constipation.    . Calcium Citrate (CITRACAL PO) Take 1 capsule by mouth daily.    . citalopram (CELEXA) 20 MG tablet Take 30 mg by mouth daily.    Marland Kitchen docusate sodium (COLACE) 100 MG capsule Take 100 mg by mouth at bedtime.    Marland Kitchen galantamine (RAZADYNE ER) 8 MG 24 hr capsule Take 8 mg by mouth daily with breakfast.    . LORazepam (ATIVAN) 0.5 MG tablet Take one tablet by mouth every night at bedtime for rest (Patient taking differently: at bedtime.  Take one tablet by mouth every night at bedtime for rest and one at 0230pm) 30 tablet 5  . metoprolol tartrate (LOPRESSOR) 25 MG tablet Take 25-50 mg by mouth 2 (two) times daily. 2 in the morning and 1 at night    . Multiple Vitamins-Minerals (CERTAVITE SENIOR/ANTIOXIDANT PO) Take 1 capsule by mouth daily.    . polyvinyl alcohol (LIQUIFILM TEARS) 1.4 % ophthalmic solution Place 1 drop into both eyes 3 (three) times daily as needed for dry eyes.    . potassium chloride SA (K-DUR,KLOR-CON) 20 MEQ tablet Take 20 mEq by mouth every other day.    . psyllium (METAMUCIL) 58.6 % powder Take 1 packet by mouth 2 (two) times daily as needed (Constipation). Pt takes 1 tablespoon at each dose    . simvastatin (ZOCOR) 10 MG tablet Take 1 tablet (10 mg total) by mouth daily at 6 PM. 30 tablet 2  . traMADol (ULTRAM) 50 MG tablet     . ziprasidone (GEODON) 80 MG capsule Take 1 capsule (80 mg total) by mouth at bedtime. 30 capsule 0   No current facility-administered medications on file prior to visit.    Allergies:   Allergies  Allergen Reactions  . Flagyl [Metronidazole]     Fingers get "numb"    Physical Exam General: Frail elderly petite Caucasian lady, seated, in no evident distress Head: head normocephalic and atraumatic.  Neck: supple with no carotid or supraclavicular bruits Cardiovascular: regular rate and rhythm, soft ejection murmur Musculoskeletal: no deformity. Mild kyphoscoliosis Skin:  no rash/petichiae Vascular:  Normal pulses all extremities Filed Vitals:   01/05/15 1008  BP: 116/70  Pulse: 61   Neurologic Exam Mental Status: Awake and fully alert. Oriented to place and time. Recent and remote memory intact. Recall 2/3. Attention span, concentration and fund of knowledge slightly diminished. Mood and affect appropriate. Animal naming 8. Cranial Nerves: Fundoscopic exam reveals sharp disc margins. Pupils equal, briskly reactive to light. Extraocular movements full without nystagmus.  Visual fields full to confrontation. Hearing mildly diminished bilaterally. Facial sensation intact. Face, tongue, palate moves normally and symmetrically.  Motor: Diminished bulk and tone. Normal strength in all tested extremity muscles. Sensory.: intact to touch ,pinprick .position and vibratory sensation.  Coordination: Rapid alternating movements normal in all extremities. Finger-to-nose and heel-to-shin performed accurately bilaterally. Gait and Station: Arises from chair with t difficulty. Stance is stooped. Gait deferred as patient did not bring walker Reflexes: 1+ and symmetric. Toes downgoing.   NIHSS  0 Modified Rankin  3   ASSESSMENT: 31 year Caucasian lady with embolic left MCA branch infarcts in May  2016 secondary to atrial flutter treated with IV TPA with excellent clinical recovery. Long-standing history of mild cognitive impairment which is stable and long standing gait difficulty secondary to arthritis and hip surgery. Vascular risk factors of hypertension, hyperlipidemia, atrial flutter and stroke    PLAN: I had a long d/w patient and daughter [name] about her recent stroke, risk for recurrent stroke/TIAs, personally independently reviewed imaging studies and stroke evaluation results and answered questions.Continue Eliquis  for secondary stroke prevention given h/o atrial flutter and maintain strict control of hypertension with blood pressure goal below 130/90,   and lipids with LDL cholesterol goal below 100 mg/dL. I also advised the patient to eat a healthy diet with plenty of whole grains, cereals, fruits and vegetables, exercise regularly and maintain ideal body weight .she was also advised to continue her present time in the current dose for mild cognitive impairment which appears to be stable. I also advised her fall and safety precautions and to use her walker at all times while ambulating.Greater than 50% of time during this 25 minute visit was spent on  counseling,explanation of diagnosis, planning of further management, discussion with patient and family and coordination of care  Followup in the future with me in 6 months or call earlier if necessary. Delia Heady, MD Note: This document was prepared with digital dictation and possible smart phrase technology. Any transcriptional errors that result from this process are unintentional

## 2015-01-05 NOTE — Patient Instructions (Signed)
I had a long d/w patient and daughter Yolanda Baker about her recent stroke, risk for recurrent stroke/TIAs, personally independently reviewed imaging studies and stroke evaluation results and answered questions.Continue Eliquis  for secondary stroke prevention given h/o atrial flutter and maintain strict control of hypertension with blood pressure goal below 130/90,   and lipids with LDL cholesterol goal below 100 mg/dL. I also advised the patient to eat a healthy diet with plenty of whole grains, cereals, fruits and vegetables, exercise regularly and maintain ideal body weight .she was also advised to continue her present time in the current dose for mild cognitive impairment which appears to be stable. I also advised her fall and safety precautions and to use her walker at all times while ambulating. Followup in the future with me in 6 months or call earlier if necessary. Fall Prevention in the Home  Falls can cause injuries and can affect people from all age groups. There are many simple things that you can do to make your home safe and to help prevent falls. WHAT CAN I DO ON THE OUTSIDE OF MY HOME?  Regularly repair the edges of walkways and driveways and fix any cracks.  Remove high doorway thresholds.  Trim any shrubbery on the main path into your home.  Use bright outdoor lighting.  Clear walkways of debris and clutter, including tools and rocks.  Regularly check that handrails are securely fastened and in good repair. Both sides of any steps should have handrails.  Install guardrails along the edges of any raised decks or porches.  Have leaves, snow, and ice cleared regularly.  Use sand or salt on walkways during winter months.  In the garage, clean up any spills right away, including grease or oil spills. WHAT CAN I DO IN THE BATHROOM?  Use night lights.  Install grab bars by the toilet and in the tub and shower. Do not use towel bars as grab bars.  Use non-skid mats or decals on the  floor of the tub or shower.  If you need to sit down while you are in the shower, use a plastic, non-slip stool.[place] Kitchen  Keep the floor dry. Immediately clean up any water that spills on the floor.  Remove soap buildup in the tub or shower on a regular basis.  Attach bath mats securely with double-sided non-slip rug tape.  Remove throw rugs and other tripping hazards from the floor. WHAT CAN I DO IN THE BEDROOM?  Use night lights.  Make sure that a bedside light is easy to reach.  Do not use oversized bedding that drapes onto the floor.  Have a firm chair that has side arms to use for getting dressed.  Remove throw rugs and other tripping hazards from the floor. WHAT CAN I DO IN THE KITCHEN?   Clean up any spills right away.  Avoid walking on wet floors.  Place frequently used items in easy-to-reach places.  If you need to reach for something above you, use a sturdy step stool that has a grab bar.  Keep electrical cables out of the [place].  Do not use floor polish or wax that makes floors slippery. If you have to use wax, make sure that it is non-skid floor wax.  Remove throw rugs and other tripping hazards from the floor. WHAT CAN I DO IN THE STAIRWAYS?  Do not leave any items on the stairs.  Make sure that there are handrails on both sides of the stairs. Fix handrails that are broken or  loose. Make sure that handrails are as long as the stairways.  Check any carpeting to make sure that it is firmly attached to the stairs. Fix any carpet that is loose or worn.  Avoid having throw rugs at the top or bottom of stairways, or secure the rugs with carpet tape to prevent them from moving.  Make sure that you have a light switch at the top of the stairs and the bottom of the stairs. If you do not have them, have them installed. WHAT ARE SOME OTHER FALL PREVENTION TIPS?  Wear closed-toe shoes that fit well and support your feet. Wear shoes that have rubber soles or low  heels.  When you use a stepladder, make sure that it is completely opened and that the sides are firmly locked. Have someone hold the ladder while you are using it. Do not climb a closed stepladder.  Add color or contrast paint or tape to grab bars and handrails in your home. Place contrasting color strips on the first and last steps.  Use mobility aids as needed, such as canes, walkers, scooters, and crutches.  Turn on lights if it is dark. Replace any light bulbs that burn out.  Set up furniture so that there are clear paths. Keep the furniture in the same spot.  Fix any uneven floor surfaces.  Choose a carpet design that does not hide the edge of steps of a stairway.  Be aware of any and all pets.  Review your medicines with your healthcare provider. Some medicines can cause dizziness or changes in blood pressure, which increase your risk of falling. Talk with your health care provider about other ways that you can decrease your risk of falls. This may include working with a physical therapist or trainer to improve your strength, balance, and endurance.   This information is not intended to replace advice given to you by your health care provider. Make sure you discuss any questions you have with your health care provider.   Document Released: 02/02/2002 Document Revised: 06/29/2014 Document Reviewed: 03/19/2014 Elsevier Interactive Patient Education [employer]

## 2015-02-03 ENCOUNTER — Ambulatory Visit: Payer: Medicare Other

## 2015-03-02 ENCOUNTER — Emergency Department (HOSPITAL_COMMUNITY): Payer: Medicare Other

## 2015-03-02 ENCOUNTER — Encounter (HOSPITAL_COMMUNITY): Payer: Self-pay

## 2015-03-02 ENCOUNTER — Emergency Department (HOSPITAL_COMMUNITY)
Admission: EM | Admit: 2015-03-02 | Discharge: 2015-03-02 | Disposition: A | Discharge status: Home / Self Care | Payer: Medicare Other | Attending: Emergency Medicine | Admitting: Emergency Medicine

## 2015-03-02 DIAGNOSIS — Z872 Personal history of diseases of the skin and subcutaneous tissue: Secondary | ICD-10-CM | POA: Diagnosis not present

## 2015-03-02 DIAGNOSIS — S79912A Unspecified injury of left hip, initial encounter: Secondary | ICD-10-CM | POA: Diagnosis not present

## 2015-03-02 DIAGNOSIS — Z79899 Other long term (current) drug therapy: Secondary | ICD-10-CM | POA: Diagnosis not present

## 2015-03-02 DIAGNOSIS — Y9289 Other specified places as the place of occurrence of the external cause: Secondary | ICD-10-CM | POA: Diagnosis not present

## 2015-03-02 DIAGNOSIS — Z8673 Personal history of transient ischemic attack (TIA), and cerebral infarction without residual deficits: Secondary | ICD-10-CM | POA: Insufficient documentation

## 2015-03-02 DIAGNOSIS — M25552 Pain in left hip: Secondary | ICD-10-CM

## 2015-03-02 DIAGNOSIS — M1990 Unspecified osteoarthritis, unspecified site: Secondary | ICD-10-CM | POA: Insufficient documentation

## 2015-03-02 DIAGNOSIS — Y9389 Activity, other specified: Secondary | ICD-10-CM | POA: Insufficient documentation

## 2015-03-02 DIAGNOSIS — I10 Essential (primary) hypertension: Secondary | ICD-10-CM | POA: Diagnosis not present

## 2015-03-02 DIAGNOSIS — X58XXXA Exposure to other specified factors, initial encounter: Secondary | ICD-10-CM | POA: Diagnosis not present

## 2015-03-02 DIAGNOSIS — F419 Anxiety disorder, unspecified: Secondary | ICD-10-CM | POA: Insufficient documentation

## 2015-03-02 DIAGNOSIS — Y998 Other external cause status: Secondary | ICD-10-CM | POA: Diagnosis not present

## 2015-03-02 DIAGNOSIS — F329 Major depressive disorder, single episode, unspecified: Secondary | ICD-10-CM | POA: Diagnosis not present

## 2015-03-02 LAB — BASIC METABOLIC PANEL
Anion gap: 7 (ref 5–15)
BUN: 9 mg/dL (ref 6–20)
CO2: 27 mmol/L (ref 22–32)
Calcium: 8.3 mg/dL — ABNORMAL LOW (ref 8.9–10.3)
Chloride: 104 mmol/L (ref 101–111)
Creatinine, Ser: 0.79 mg/dL (ref 0.44–1.00)
GFR calc Af Amer: >60 mL/min (ref >60)
GFR calc non Af Amer: >60 mL/min (ref >60)
Glucose, Bld: 102 mg/dL — ABNORMAL HIGH (ref 65–99)
Potassium: 3.4 mmol/L — ABNORMAL LOW (ref 3.5–5.1)
Sodium: 138 mmol/L (ref 135–145)

## 2015-03-02 LAB — CBC WITH DIFFERENTIAL/PLATELET
Basophils Absolute: 0 10*3/uL (ref 0.0–0.1)
Basophils Relative: 0 %
Eosinophils Absolute: 0.1 10*3/uL (ref 0.0–0.7)
Eosinophils Relative: 1 %
HCT: 32 % — ABNORMAL LOW (ref 36.0–46.0)
Hemoglobin: 10.3 g/dL — ABNORMAL LOW (ref 12.0–15.0)
Lymphocytes Relative: 41 %
Lymphs Abs: 2.1 10*3/uL (ref 0.7–4.0)
MCH: 30.6 pg (ref 26.0–34.0)
MCHC: 32.2 g/dL (ref 30.0–36.0)
MCV: 95 fL (ref 78.0–100.0)
Monocytes Absolute: 0.8 10*3/uL (ref 0.1–1.0)
Monocytes Relative: 15 %
Neutro Abs: 2.1 10*3/uL (ref 1.7–7.7)
Neutrophils Relative %: 43 %
Platelets: 354 10*3/uL (ref 150–400)
RBC: 3.37 MIL/uL — ABNORMAL LOW (ref 3.87–5.11)
RDW: 14.8 % (ref 11.5–15.5)
WBC: 5 10*3/uL (ref 4.0–10.5)

## 2015-03-02 MED ORDER — ACETAMINOPHEN 500 MG PO TABS: 500.0000 mg | ORAL_TABLET | Freq: Two times a day (BID) | ORAL | Status: DC | PRN

## 2015-03-02 MED ORDER — ACETAMINOPHEN 325 MG PO TABS
650.0000 mg | ORAL_TABLET | Freq: Once | ORAL | Status: AC
  Administered 2015-03-02: 650 mg via ORAL
  Filled 2015-03-02: qty 2

## 2015-03-02 MED ORDER — ACETAMINOPHEN 500 MG PO TABS
1000.0000 mg | ORAL_TABLET | Freq: Once | ORAL | Status: DC
Start: 2015-03-02 — End: 2015-03-02

## 2015-03-02 MED ORDER — ACETAMINOPHEN-CODEINE 300-30 MG PO TABS: 1.0000 | ORAL_TABLET | Freq: Four times a day (QID) | ORAL | Status: DC

## 2015-03-02 NOTE — ED Notes (Signed)
Phleb at bedside  

## 2015-03-02 NOTE — Discharge Instructions (Signed)
You were seen today for your hip pain. This is secondary to a small fracture. This was discussed with your orthopedic surgery office. They said at this time this is not something that needs an operation. They recommended pain control. They also recommended using a wheelchair except for when transferring. Try to limit weightbearing on the leg as much as possible. Take the Tylenol 3 as prescribed. For breakthrough pain or pain that is not controlled with the Tylenol 3 first attempt and Ultram to see if that helps to control the pain. If that does not help then he can also use the Tylenol that is ordered as needed.  Keep your follow-up appointment with the orthopedist. If you need additional pain control either talk to the physician at the nursing facility or contact her orthopedic surgeon's office.  Hip Pain Your hip is the joint between your upper legs and your lower pelvis. The bones, cartilage, tendons, and muscles of your hip joint perform a lot of work each day supporting your body weight and allowing you to move around. Hip pain can range from a minor ache to severe pain in one or both of your hips. Pain may be felt on the inside of the hip joint near the groin, or the outside near the buttocks and upper thigh. You may have swelling or stiffness as well.  HOME CARE INSTRUCTIONS   Take medicines only as directed by your health care provider.  Apply ice to the injured area:  Put ice in a plastic bag.  Place a towel between your skin and the bag.  Leave the ice on for 15-20 minutes at a time, 3-4 times a day.  Keep your leg raised (elevated) when possible to lessen swelling.  Avoid activities that cause pain.  Follow specific exercises as directed by your health care provider.  Sleep with a pillow between your legs on your most comfortable side.  Record how often you have hip pain, the location of the pain, and what it feels like. SEEK MEDICAL CARE IF:   You are unable to put weight on  your leg.  Your hip is red or swollen or very tender to touch.  Your pain or swelling continues or worsens after 1 week.  You have increasing difficulty walking.  You have a fever. SEEK IMMEDIATE MEDICAL CARE IF:   You have fallen.  You have a sudden increase in pain and swelling in your hip. MAKE SURE YOU:   Understand these instructions.  Will watch your condition.  Will get help right away if you are not doing well or get worse.   This information is not intended to replace advice given to you by your health care provider. Make sure you discuss any questions you have with your health care provider.   Document Released: 08/02/2009 Document Revised: 03/05/2014 Document Reviewed: 10/09/2012 Elsevier Interactive Patient Education [employer]

## 2015-03-02 NOTE — ED Notes (Addendum)
Per EMS - pt from [facility]. Pt fell Dec 22 w/ no injuries on xrays done. Pt has had left hip pain since incident, progressively worse today. Pt given tylenol and hydrocodone at 0100 with relief. No pain w/ palpation, mild pain w/ movement of left lower extremity. A&O x 4. Pupils pinpoint  Pt also has hx stroke - unknown by facility as to what, if any, residual deficits there are.

## 2015-03-02 NOTE — ED Provider Notes (Signed)
Patient accepted in sign out pending orthopedic consultation.  Per Dr. Durwin Nora the xray from today looks the same as the one that was done in the office on 12/30.  This is non operable.  They recommend limited weight bearing and pain control as well as follow up with orthopedics outpatient.  This was discussed at length with patient and her daughter at bedside.  They expressed understanding and agreement with plan of care.  Leta Baptist, MD 03/02/15 [phone]

## 2015-03-10 ENCOUNTER — Ambulatory Visit: Payer: Medicare Other

## 2015-03-16 NOTE — ED Provider Notes (Signed)
CSN: [id number]     Arrival date & time 03/02/15  0455 History   First MD Initiated Contact with Patient 03/02/15 0457     Chief Complaint  Patient presents with  . Hip Pain     (Consider location/radiation/quality/duration/timing/severity/associated sxs/prior Treatment) Patient is a 82 y.o. female presenting with hip pain. The history is provided by the patient.  Hip Pain This is a recurrent problem. The current episode started more than 2 days ago. The problem occurs constantly. The problem has been gradually worsening. Pertinent negatives include no chest pain, no abdominal pain, no headaches and no shortness of breath. The symptoms are aggravated by twisting, walking and bending. Nothing relieves the symptoms. She has tried nothing for the symptoms. The treatment provided no relief.    82 yo F with L hip pain, going on for past couple weeks.  Sudden worsening today.  Has hx of hip replacement to that side, saw ortho for this with possible "hairline" fx near the existing hardware. Unable to walk, pain with movement, palpation.  Difficult to describe due to dementia.   Past Medical History  Diagnosis Date  . Osteoporosis   . Hypertension   . Arthritis     neck  . Depression   . Anxiety   . Dysrhythmia     hx A-FLUTTER after hip surg April 2015- take metoprolol  . Shortness of breath     with activity  . Stroke [facility]     "mini stroke" per MRI - no deficiets   . Hip joint pain     RT  . Bruises easily   . Eczema   . Memory loss    Past Surgical History  Procedure Laterality Date  . Left hip Left 2008  . Abdominal hysterectomy    . Fracture surgery Right 06/05/2013    hip  . Intramedullary (im) nail intertrochanteric Right 06/05/2013    Procedure: INTRAMEDULLARY (IM) NAIL INTERTROCHANTRIC;  Surgeon: Loanne Drilling, MD;  Location: [facility];  Service: Orthopedics;  Laterality: Right;  . Joint replacement      left hip  . Hip arthroplasty Right 10/03/2013    Procedure:  CONVERSION OF PREVIOUS RIGHT HIP SURGERY TO A RIGHT TOTAL HIP ARTHROPLASTY;  Surgeon: Loanne Drilling, MD;  Location: [facility];  Service: Orthopedics;  Laterality: Right;  . Leg surgery Right     x2   Family History  Problem Relation Age of Onset  . Heart attack Father   . Hypertension Father   . Hypertension Sister   . Stroke Sister   . Hypertension Brother    Social History  Substance Use Topics  . Smoking status: Never Smoker   . Smokeless tobacco: Never Used  . Alcohol Use: No   OB History    No data available     Review of Systems  Constitutional: Negative for fever and chills.  HENT: Negative for congestion and rhinorrhea.   Eyes: Negative for redness and visual disturbance.  Respiratory: Negative for shortness of breath and wheezing.   Cardiovascular: Negative for chest pain and palpitations.  Gastrointestinal: Negative for nausea, vomiting and abdominal pain.  Genitourinary: Negative for dysuria and urgency.  Musculoskeletal: Positive for myalgias and arthralgias.  Skin: Negative for pallor and wound.  Neurological: Negative for dizziness and headaches.   Level V caveat dementia.   Allergies  Flagyl  Home Medications   Prior to Admission medications   Medication Sig Start Date End Date Taking? Authorizing Provider  amLODipine (NORVASC) 5  MG tablet Take 5 mg by mouth every morning.   Yes Historical Provider, MD  antiseptic oral rinse (BIOTENE) LIQD 15 mLs by Mouth Rinse route 2 (two) times daily as needed for dry mouth.   Yes Historical Provider, MD  apixaban (ELIQUIS) 2.5 MG TABS tablet Take 1 tablet (2.5 mg total) by mouth 2 (two) times daily. 07/01/14  Yes Layne Benton, NP  Biotin 5 MG CAPS Take 5 mg by mouth daily.   Yes Historical Provider, MD  bisacodyl (DULCOLAX) 5 MG EC tablet Take 5 mg by mouth daily as needed for moderate constipation.   Yes Historical Provider, MD  Calcium Citrate-Vitamin D (CITRACAL PETITES/VITAMIN D) 200-250 MG-UNIT TABS Take 1 tablet  by mouth daily.   Yes Historical Provider, MD  chlorhexidine (PERIDEX) 0.12 % solution Use as directed 15 mLs in the mouth or throat 2 (two) times daily.   Yes Historical Provider, MD  citalopram (CELEXA) 10 MG tablet Take 30 mg by mouth daily.  01/03/15  Yes Historical Provider, MD  docusate sodium (COLACE) 100 MG capsule Take 100 mg by mouth at bedtime.   Yes Historical Provider, MD  galantamine (RAZADYNE ER) 8 MG 24 hr capsule Take 8 mg by mouth daily with breakfast.   Yes Historical Provider, MD  imiquimod (ALDARA) 5 % cream Apply topically 2 (two) times a week. To hand   Yes Historical Provider, MD  LORazepam (ATIVAN) 0.5 MG tablet Take one tablet by mouth every night at bedtime for rest Patient taking differently: Take 0.5 mg by mouth 2 (two) times daily. Take one tablet by mouth every night at bedtime for rest and one at 0230p 07/01/14  Yes Layne Benton, NP  metoprolol tartrate (LOPRESSOR) 25 MG tablet Take 25 mg by mouth daily.    Yes Historical Provider, MD  metoprolol tartrate (LOPRESSOR) 25 MG tablet Take 50 mg by mouth daily.   Yes Historical Provider, MD  Multiple Vitamins-Minerals (CERTAVITE SENIOR/ANTIOXIDANT PO) Take 1 capsule by mouth daily.   Yes Historical Provider, MD  polyethylene glycol (MIRALAX / GLYCOLAX) packet Take 17 g by mouth daily.   Yes Historical Provider, MD  polyvinyl alcohol (LIQUIFILM TEARS) 1.4 % ophthalmic solution Place 1 drop into both eyes 3 (three) times daily as needed for dry eyes.   Yes Historical Provider, MD  potassium chloride SA (K-DUR,KLOR-CON) 20 MEQ tablet Take 20 mEq by mouth every other day.   Yes Historical Provider, MD  psyllium (METAMUCIL) 58.6 % powder Take 1 packet by mouth 2 (two) times daily. Pt takes 1 tablespoon at each dose   Yes Historical Provider, MD  simvastatin (ZOCOR) 10 MG tablet Take 1 tablet (10 mg total) by mouth daily at 6 PM. 07/01/14  Yes Layne Benton, NP  traMADol (ULTRAM) 50 MG tablet Take 50 mg by mouth every 8 (eight) hours  as needed for moderate pain.  07/07/14  Yes Historical Provider, MD  ziprasidone (GEODON) 80 MG capsule Take 1 capsule (80 mg total) by mouth at bedtime. 07/01/14  Yes Layne Benton, NP  acetaminophen (TYLENOL) 500 MG tablet Take 1 tablet (500 mg total) by mouth 2 (two) times daily as needed for mild pain or moderate pain. 03/02/15   Leta Baptist, MD  Acetaminophen-Codeine (TYLENOL/CODEINE #3) 300-30 MG tablet Take 1 tablet by mouth every 6 (six) hours. While awake.  Do not wake patient to give medication. 03/02/15   Leta Baptist, MD   BP 145/88 mmHg  Pulse 62  Temp(Src)  97.6 F (36.4 C) (Oral)  Resp 18  SpO2 99% Physical Exam  Constitutional: She is oriented to person, place, and time. She appears well-developed and well-nourished. No distress.  HENT:  Head: Normocephalic and atraumatic.  Eyes: EOM are normal. Pupils are equal, round, and reactive to light.  Neck: Normal range of motion. Neck supple.  Cardiovascular: Normal rate and regular rhythm.  Exam reveals no gallop and no friction rub.   No murmur heard. Pulmonary/Chest: Effort normal. She has no wheezes. She has no rales.  Abdominal: Soft. She exhibits no distension. There is no tenderness. There is no rebound.  Musculoskeletal: She exhibits tenderness. She exhibits no edema.  TTP with internal and external rotation of the L hip. PMS intact distally.   Neurological: She is alert and oriented to person, place, and time.  Skin: Skin is warm and dry. She is not diaphoretic.  Psychiatric: She has a normal mood and affect. Her behavior is normal.  Nursing note and vitals reviewed.   ED Course  Procedures (including critical care time) Labs Review Labs Reviewed  CBC WITH DIFFERENTIAL/PLATELET - Abnormal; Notable for the following:    RBC 3.37 (*)    Hemoglobin 10.3 (*)    HCT 32.0 (*)    All other components within normal limits  BASIC METABOLIC PANEL - Abnormal; Notable for the following:    Potassium 3.4 (*)    Glucose,  Bld 102 (*)    Calcium 8.3 (*)    All other components within normal limits    Imaging Review No results found. I have personally reviewed and evaluated these images and lab results as part of my medical decision-making.   EKG Interpretation None      MDM   Final diagnoses:  Hip pain, left    82 yo F with L hip pain.  Xray with significant displaced fracture fragment near hardware.  Ortho consulted.   Turned over to Dr. Cyndie Chime.   The patients results and plan were reviewed and discussed.   Any x-rays performed were independently reviewed by myself.   Differential diagnosis were considered with the presenting HPI.  Medications  acetaminophen (TYLENOL) tablet 650 mg (650 mg Oral Given 03/02/15 0548)    Filed Vitals:   03/02/15 0730 03/02/15 0800 03/02/15 0830 03/02/15 0848  BP: 162/78 155/75 145/88   Pulse: 58 58 62   Temp:    97.6 F (36.4 C)  TempSrc:    Oral  Resp:      SpO2: 100% 100% 99%     Final diagnoses:  Hip pain, left         Melene Plan, DO 03/16/15 1748

## 2015-04-22 ENCOUNTER — Encounter (HOSPITAL_COMMUNITY): Payer: Self-pay

## 2015-04-22 ENCOUNTER — Emergency Department (HOSPITAL_COMMUNITY)
Admission: EM | Admit: 2015-04-22 | Discharge: 2015-04-22 | Disposition: A | Discharge status: Home / Self Care | Payer: Medicare Other | Attending: Emergency Medicine | Admitting: Emergency Medicine

## 2015-04-22 DIAGNOSIS — M810 Age-related osteoporosis without current pathological fracture: Secondary | ICD-10-CM | POA: Insufficient documentation

## 2015-04-22 DIAGNOSIS — F419 Anxiety disorder, unspecified: Secondary | ICD-10-CM | POA: Insufficient documentation

## 2015-04-22 DIAGNOSIS — Z79899 Other long term (current) drug therapy: Secondary | ICD-10-CM | POA: Diagnosis not present

## 2015-04-22 DIAGNOSIS — I4892 Unspecified atrial flutter: Secondary | ICD-10-CM | POA: Diagnosis not present

## 2015-04-22 DIAGNOSIS — I10 Essential (primary) hypertension: Secondary | ICD-10-CM | POA: Diagnosis not present

## 2015-04-22 DIAGNOSIS — Z8673 Personal history of transient ischemic attack (TIA), and cerebral infarction without residual deficits: Secondary | ICD-10-CM | POA: Insufficient documentation

## 2015-04-22 DIAGNOSIS — F329 Major depressive disorder, single episode, unspecified: Secondary | ICD-10-CM | POA: Insufficient documentation

## 2015-04-22 DIAGNOSIS — Z7901 Long term (current) use of anticoagulants: Secondary | ICD-10-CM | POA: Diagnosis not present

## 2015-04-22 DIAGNOSIS — M47812 Spondylosis without myelopathy or radiculopathy, cervical region: Secondary | ICD-10-CM | POA: Insufficient documentation

## 2015-04-22 DIAGNOSIS — L02415 Cutaneous abscess of right lower limb: Secondary | ICD-10-CM | POA: Insufficient documentation

## 2015-04-22 MED ORDER — SULFAMETHOXAZOLE-TRIMETHOPRIM 800-160 MG PO TABS
1.0000 | ORAL_TABLET | Freq: Once | ORAL | Status: AC
  Administered 2015-04-22: 1 via ORAL
  Filled 2015-04-22: qty 1

## 2015-04-22 MED ORDER — CEPHALEXIN 500 MG PO CAPS
500.0000 mg | ORAL_CAPSULE | Freq: Once | ORAL | Status: AC
  Administered 2015-04-22: 500 mg via ORAL
  Filled 2015-04-22: qty 1

## 2015-04-22 MED ORDER — SULFAMETHOXAZOLE-TRIMETHOPRIM 800-160 MG PO TABS: 1.0000 | ORAL_TABLET | Freq: Two times a day (BID) | ORAL | Status: AC

## 2015-04-22 MED ORDER — CEPHALEXIN 500 MG PO CAPS: 500.0000 mg | ORAL_CAPSULE | Freq: Three times a day (TID) | ORAL | Status: DC

## 2015-04-22 MED ORDER — LIDOCAINE HCL 2 % IJ SOLN
10.0000 mL | Freq: Once | INTRAMUSCULAR | Status: AC
  Administered 2015-04-22: 200 mg via INTRADERMAL
  Filled 2015-04-22: qty 20

## 2015-04-22 NOTE — Discharge Instructions (Signed)
Take both antibiotics as prescribed until all gone. Follow up with Dr. Despina Hick in the office on Tuesday. Change dressing twice a day. Return if worsening erythema and swelling, or if develop high fever.    Abscess An abscess is an infected area that contains a collection of pus and debris.It can occur in almost any part of the body. An abscess is also known as a furuncle or boil. CAUSES  An abscess occurs when tissue gets infected. This can occur from blockage of oil or sweat glands, infection of hair follicles, or a minor injury to the skin. As the body tries to fight the infection, pus collects in the area and creates pressure under the skin. This pressure causes pain. People with weakened immune systems have difficulty fighting infections and get certain abscesses more often.  SYMPTOMS Usually an abscess develops on the skin and becomes a painful mass that is red, warm, and tender. If the abscess forms under the skin, you may feel a moveable soft area under the skin. Some abscesses break open (rupture) on their own, but most will continue to get worse without care. The infection can spread deeper into the body and eventually into the bloodstream, causing you to feel ill.  DIAGNOSIS  Your caregiver will take your medical history and perform a physical exam. A sample of fluid may also be taken from the abscess to determine what is causing your infection. TREATMENT  Your caregiver may prescribe antibiotic medicines to fight the infection. However, taking antibiotics alone usually does not cure an abscess. Your caregiver may need to make a small cut (incision) in the abscess to drain the pus. In some cases, gauze is packed into the abscess to reduce pain and to continue draining the area. HOME CARE INSTRUCTIONS   Only take over-the-counter or prescription medicines for pain, discomfort, or fever as directed by your caregiver.  If you were prescribed antibiotics, take them as directed. Finish them  even if you start to feel better.  If gauze is used, follow your caregiver's directions for changing the gauze.  To avoid spreading the infection:  Keep your draining abscess covered with a bandage.  Wash your hands well.  Do not share personal care items, towels, or whirlpools with others.  Avoid skin contact with others.  Keep your skin and clothes clean around the abscess.  Keep all follow-up appointments as directed by your caregiver. SEEK MEDICAL CARE IF:   You have increased pain, swelling, redness, fluid drainage, or bleeding.  You have muscle aches, chills, or a general ill feeling.  You have a fever. MAKE SURE YOU:   Understand these instructions.  Will watch your condition.  Will get help right away if you are not doing well or get worse.   This information is not intended to replace advice given to you by your health care provider. Make sure you discuss any questions you have with your health care provider.   Document Released: 11/22/2004 Document Revised: 08/14/2011 Document Reviewed: 04/27/2011 Elsevier Interactive Patient Education [employer]

## 2015-04-22 NOTE — ED Provider Notes (Signed)
CSN: [id number]     Arrival date & time 04/22/15  1448 History   First MD Initiated Contact with Patient 04/22/15 1719     Chief Complaint  Patient presents with  . Abscess     (Consider location/radiation/quality/duration/timing/severity/associated sxs/prior Treatment) HPI Yolanda Baker is a 82 y.o. female with history of arthritis, CVA, a flutter, presents to emergency department complaining of an abscess to the right hip. Patient status post right hip replacement 2, last surgery was in August 2016, performed by Dr. Despina Hick. Pt states approximately 2 days ago she noticed a red and painful area to the right hip right over the incision. She states it only bothers her when she pushes on it. She denies any fever or chills. Nausea vomiting. No pain with movement of the hip that is more than usual. Per Dr. Despina Hick, pt has had similar thing happen to her 2 years ago, and it was treated with wet to dry dressing after drainage. Pt reports no other complaints.   Past Medical History  Diagnosis Date  . Osteoporosis   . Hypertension   . Arthritis     neck  . Depression   . Anxiety   . Dysrhythmia     hx A-FLUTTER after hip surg April 2015- take metoprolol  . Shortness of breath     with activity  . Stroke [facility])     "mini stroke" per MRI - no deficiets   . Hip joint pain     RT  . Bruises easily   . Eczema   . Memory loss    Past Surgical History  Procedure Laterality Date  . Left hip Left 2008  . Abdominal hysterectomy    . Fracture surgery Right 06/05/2013    hip  . Intramedullary (im) nail intertrochanteric Right 06/05/2013    Procedure: INTRAMEDULLARY (IM) NAIL INTERTROCHANTRIC;  Surgeon: Loanne Drilling, MD;  Location: [facility];  Service: Orthopedics;  Laterality: Right;  . Joint replacement      left hip  . Hip arthroplasty Right 10/03/2013    Procedure: CONVERSION OF PREVIOUS RIGHT HIP SURGERY TO A RIGHT TOTAL HIP ARTHROPLASTY;  Surgeon: Loanne Drilling, MD;  Location: [facility];   Service: Orthopedics;  Laterality: Right;  . Leg surgery Right     x2   Family History  Problem Relation Age of Onset  . Heart attack Father   . Hypertension Father   . Hypertension Sister   . Stroke Sister   . Hypertension Brother    Social History  Substance Use Topics  . Smoking status: Never Smoker   . Smokeless tobacco: Never Used  . Alcohol Use: No   OB History    No data available     Review of Systems  Constitutional: Negative for fever and chills.  Respiratory: Negative for cough, chest tightness and shortness of breath.   Cardiovascular: Negative for chest pain, palpitations and leg swelling.  Musculoskeletal: Negative for joint swelling, arthralgias, neck pain and neck stiffness.  Skin: Positive for wound. Negative for rash.  Neurological: Negative for dizziness, weakness and headaches.  All other systems reviewed and are negative.     Allergies  Flagyl  Home Medications   Prior to Admission medications   Medication Sig Start Date End Date Taking? Authorizing Provider  acetaminophen (TYLENOL) 500 MG tablet Take 1 tablet (500 mg total) by mouth 2 (two) times daily as needed for mild pain or moderate pain. 03/02/15   Leta Baptist, MD  Acetaminophen-Codeine (  TYLENOL/CODEINE #3) 300-30 MG tablet Take 1 tablet by mouth every 6 (six) hours. While awake.  Do not wake patient to give medication. 03/02/15   Leta Baptist, MD  amLODipine (NORVASC) 5 MG tablet Take 5 mg by mouth every morning.    Historical Provider, MD  antiseptic oral rinse (BIOTENE) LIQD 15 mLs by Mouth Rinse route 2 (two) times daily as needed for dry mouth.    Historical Provider, MD  apixaban (ELIQUIS) 2.5 MG TABS tablet Take 1 tablet (2.5 mg total) by mouth 2 (two) times daily. 07/01/14   Layne Benton, NP  Biotin 5 MG CAPS Take 5 mg by mouth daily.    Historical Provider, MD  bisacodyl (DULCOLAX) 5 MG EC tablet Take 5 mg by mouth daily as needed for moderate constipation.    Historical  Provider, MD  Calcium Citrate-Vitamin D (CITRACAL PETITES/VITAMIN D) 200-250 MG-UNIT TABS Take 1 tablet by mouth daily.    Historical Provider, MD  chlorhexidine (PERIDEX) 0.12 % solution Use as directed 15 mLs in the mouth or throat 2 (two) times daily.    Historical Provider, MD  citalopram (CELEXA) 10 MG tablet Take 30 mg by mouth daily.  01/03/15   Historical Provider, MD  docusate sodium (COLACE) 100 MG capsule Take 100 mg by mouth at bedtime.    Historical Provider, MD  galantamine (RAZADYNE ER) 8 MG 24 hr capsule Take 8 mg by mouth daily with breakfast.    Historical Provider, MD  imiquimod (ALDARA) 5 % cream Apply topically 2 (two) times a week. To hand    Historical Provider, MD  LORazepam (ATIVAN) 0.5 MG tablet Take one tablet by mouth every night at bedtime for rest Patient taking differently: Take 0.5 mg by mouth 2 (two) times daily. Take one tablet by mouth every night at bedtime for rest and one at 0230p 07/01/14   Layne Benton, NP  metoprolol tartrate (LOPRESSOR) 25 MG tablet Take 25 mg by mouth daily.     Historical Provider, MD  metoprolol tartrate (LOPRESSOR) 25 MG tablet Take 50 mg by mouth daily.    Historical Provider, MD  Multiple Vitamins-Minerals (CERTAVITE SENIOR/ANTIOXIDANT PO) Take 1 capsule by mouth daily.    Historical Provider, MD  polyethylene glycol (MIRALAX / GLYCOLAX) packet Take 17 g by mouth daily.    Historical Provider, MD  polyvinyl alcohol (LIQUIFILM TEARS) 1.4 % ophthalmic solution Place 1 drop into both eyes 3 (three) times daily as needed for dry eyes.    Historical Provider, MD  potassium chloride SA (K-DUR,KLOR-CON) 20 MEQ tablet Take 20 mEq by mouth every other day.    Historical Provider, MD  psyllium (METAMUCIL) 58.6 % powder Take 1 packet by mouth 2 (two) times daily. Pt takes 1 tablespoon at each dose    Historical Provider, MD  simvastatin (ZOCOR) 10 MG tablet Take 1 tablet (10 mg total) by mouth daily at 6 PM. 07/01/14   Layne Benton, NP  traMADol  (ULTRAM) 50 MG tablet Take 50 mg by mouth every 8 (eight) hours as needed for moderate pain.  07/07/14   Historical Provider, MD  ziprasidone (GEODON) 80 MG capsule Take 1 capsule (80 mg total) by mouth at bedtime. 07/01/14   Layne Benton, NP   BP 125/65 mmHg  Pulse 68  Temp(Src) 98.7 F (37.1 C) (Oral)  Resp 14  SpO2 100% Physical Exam  Constitutional: She appears well-developed and well-nourished. No distress.  Eyes: Conjunctivae are normal.  Neck: Neck supple.  Cardiovascular: Normal rate, regular rhythm and normal heart sounds.   Pulmonary/Chest: Effort normal and breath sounds normal. No respiratory distress. She has no wheezes. She has no rales.  Neurological: She is alert.  Skin: Skin is warm and dry.  A 3 cm cutaneous abscess to the right hip overlying prior hip arthroplasty incision. Slightly erythematous with mild surrounding cellulitis. Indurated. Tender to palpation.  Nursing note and vitals reviewed.   ED Course  Procedures (including critical care time) Labs Review Labs Reviewed - No data to display  Imaging Review No results found. I have personally reviewed and evaluated these images and lab results as part of my medical decision-making.   EKG Interpretation None      INCISION AND DRAINAGE Performed by: Jaynie Crumble A Consent: Verbal consent obtained. Risks and benefits: risks, benefits and alternatives were discussed Type: abscess  Body area: right hip abscess  Anesthesia: local infiltration  Incision was made with a scalpel.  Local anesthetic: lidocaine 2% wo epinephrine  Anesthetic total: 3 ml  Complexity: complex Blunt dissection to break up loculations  Drainage: purulent  Drainage amount: large  Packing material: 1/2 in iodoform gauze  Patient tolerance: Patient tolerated the procedure well with no immediate complications.    MDM   Final diagnoses:  Abscess of right thigh    patient with an abscess over right hip,  overlying prior incision. Discussed with Dr. Rhunette Croft, who use bedside ultrasound to evaluate the abscess. Visualized pus pocket, does not appear to extend all the [place] to the bone. I discussed this with Dr. Despina Hick, who is okay with as opening the abscess, packing, have patient follow-up on Tuesday with him. He reports history of the same several years ago.  Abscesses incised and drained. Initially unable to get pus, however Dr.Nanavati was able to aspirate purulent material and was able to extend incision deeper. I irrigated the wound. Culture sent. Pt is afebrile. Non toxic appearing. Stable for dc. Home with keflex and bactrim.  Filed Vitals:   04/22/15 1509 04/22/15 1822 04/22/15 1955  BP: 125/65 167/74 164/77  Pulse: 68 67 72  Temp: 98.7 F (37.1 C)    TempSrc: Oral    Resp: 14 15   SpO2: 100% 100% 100%     Jaynie Crumble, PA-C 04/23/15 0019  Derwood Kaplan, MD 04/24/15 1610

## 2015-04-22 NOTE — ED Notes (Signed)
Pt here with knot on rt hip.  Showed up last night

## 2015-04-25 ENCOUNTER — Ambulatory Visit: Payer: Medicare Other

## 2015-04-25 LAB — WOUND CULTURE: Gram Stain: NONE SEEN

## 2015-04-26 ENCOUNTER — Telehealth (HOSPITAL_BASED_OUTPATIENT_CLINIC_OR_DEPARTMENT_OTHER): Payer: Self-pay | Admitting: Emergency Medicine

## 2015-04-26 NOTE — Telephone Encounter (Signed)
Post ED Visit - Positive Culture Follow-up  Culture report reviewed by antimicrobial stewardship pharmacist:  []  Enzo Bi, Pharm.D. []  Celedonio Miyamoto, Pharm.D., BCPS [x]  Garvin Fila, Pharm.D. []  Georgina Pillion, Pharm.D., BCPS []  [place], [place].D., BCPS, AAHIVP []  Estella Husk, Pharm.D., BCPS, AAHIVP []  Tennis Must, Pharm.D. []  Sherle Poe, [place].D.  Positive wound culture E. coli Treated with bactrim DS and cephalexin, organism sensitive to the same and no further patient follow-up is required at this time.  Berle Mull 04/26/2015, 9:34 AM

## 2015-04-27 ENCOUNTER — Encounter (HOSPITAL_COMMUNITY): Payer: Self-pay

## 2015-04-27 ENCOUNTER — Ambulatory Visit (HOSPITAL_COMMUNITY)
Admission: RE | Admit: 2015-04-27 | Discharge: 2015-04-27 | Disposition: A | Discharge status: Home / Self Care | Payer: Medicare Other | Source: Ambulatory Visit | Attending: Internal Medicine | Admitting: Internal Medicine

## 2015-04-27 DIAGNOSIS — M810 Age-related osteoporosis without current pathological fracture: Secondary | ICD-10-CM | POA: Insufficient documentation

## 2015-04-27 MED ORDER — DENOSUMAB 60 MG/ML ~~LOC~~ SOLN
60.0000 mg | Freq: Once | SUBCUTANEOUS | Status: AC
  Administered 2015-04-27: 60 mg via SUBCUTANEOUS
  Filled 2015-04-27: qty 1

## 2015-04-27 NOTE — Discharge Instructions (Signed)
Denosumab injection  What is this medicine?  DENOSUMAB (den oh sue mab) slows bone breakdown. Prolia is used to treat osteoporosis in women after menopause and in men. Xgeva is used to prevent bone fractures and other bone problems caused by cancer bone metastases. Xgeva is also used to treat giant cell tumor of the bone.  This medicine may be used for other purposes; ask your health care provider or pharmacist if you have questions.  What should I tell my health care provider before I take this medicine?  They need to know if you have any of these conditions:  -dental disease  -eczema  -infection or history of infections  -kidney disease or on dialysis  -low blood calcium or vitamin D  -malabsorption syndrome  -scheduled to have surgery or tooth extraction  -taking medicine that contains denosumab  -thyroid or parathyroid disease  -an unusual reaction to denosumab, other medicines, foods, dyes, or preservatives  -pregnant or trying to get pregnant  -breast-feeding  How should I use this medicine?  This medicine is for injection under the skin. It is given by a health care professional in a hospital or clinic setting.  If you are getting Prolia, a special MedGuide will be given to you by the pharmacist with each prescription and refill. Be sure to read this information carefully each time.  For Prolia, talk to your pediatrician regarding the use of this medicine in children. Special care may be needed. For Xgeva, talk to your pediatrician regarding the use of this medicine in children. While this drug may be prescribed for children as young as 13 years for selected conditions, precautions do apply.  Overdosage: If you think you have taken too much of this medicine contact a poison control center or emergency room at once.  NOTE: This medicine is only for you. Do not share this medicine with others.  What if I miss a dose?  It is important not to miss your dose. Call your doctor or health care professional if you are  unable to keep an appointment.  What may interact with this medicine?  Do not take this medicine with any of the following medications:  -other medicines containing denosumab  This medicine may also interact with the following medications:  -medicines that suppress the immune system  -medicines that treat cancer  -steroid medicines like prednisone or cortisone  This list may not describe all possible interactions. Give your health care provider a list of all the medicines, herbs, non-prescription drugs, or dietary supplements you use. Also tell them if you smoke, drink alcohol, or use illegal drugs. Some items may interact with your medicine.  What should I watch for while using this medicine?  Visit your doctor or health care professional for regular checks on your progress. Your doctor or health care professional may order blood tests and other tests to see how you are doing.  Call your doctor or health care professional if you get a cold or other infection while receiving this medicine. Do not treat yourself. This medicine may decrease your body's ability to fight infection.  You should make sure you get enough calcium and vitamin D while you are taking this medicine, unless your doctor tells you not to. Discuss the foods you eat and the vitamins you take with your health care professional.  See your dentist regularly. Brush and floss your teeth as directed. Before you have any dental work done, tell your dentist you are receiving this medicine.  Do   not become pregnant while taking this medicine or for 5 months after stopping it. Women should inform their doctor if they wish to become pregnant or think they might be pregnant. There is a potential for serious side effects to an unborn child. Talk to your health care professional or pharmacist for more information.  What side effects may I notice from receiving this medicine?  Side effects that you should report to your doctor or health care professional as soon as  possible:  -allergic reactions like skin rash, itching or hives, swelling of the face, lips, or tongue  -breathing problems  -chest pain  -fast, irregular heartbeat  -feeling faint or lightheaded, falls  -fever, chills, or any other sign of infection  -muscle spasms, tightening, or twitches  -numbness or tingling  -skin blisters or bumps, or is dry, peels, or red  -slow healing or unexplained pain in the mouth or jaw  -unusual bleeding or bruising  Side effects that usually do not require medical attention (Report these to your doctor or health care professional if they continue or are bothersome.):  -muscle pain  -stomach upset, gas  This list may not describe all possible side effects. Call your doctor for medical advice about side effects. You may report side effects to FDA at 1-800-FDA-1088.  Where should I keep my medicine?  This medicine is only given in a clinic, doctor's office, or other health care setting and will not be stored at home.  NOTE: This sheet is a summary. It may not cover all possible information. If you have questions about this medicine, talk to your doctor, pharmacist, or health care provider.      2016, Elsevier/Gold Standard. (2011-08-13 12:37:47)

## 2015-05-11 ENCOUNTER — Ambulatory Visit
Admission: RE | Admit: 2015-05-11 | Discharge: 2015-05-11 | Disposition: A | Discharge status: Home / Self Care | Payer: Medicare Other | Source: Ambulatory Visit

## 2015-05-11 DIAGNOSIS — Z1231 Encounter for screening mammogram for malignant neoplasm of breast: Secondary | ICD-10-CM

## 2015-07-04 ENCOUNTER — Ambulatory Visit (INDEPENDENT_AMBULATORY_CARE_PROVIDER_SITE_OTHER): Payer: Medicare Other | Admitting: Neurology

## 2015-07-04 ENCOUNTER — Encounter: Payer: Self-pay | Admitting: Neurology

## 2015-07-04 VITALS — BP 131/75 | HR 57 | Ht 62.0 in | Wt 105.8 lb

## 2015-07-04 DIAGNOSIS — I6529 Occlusion and stenosis of unspecified carotid artery: Secondary | ICD-10-CM

## 2015-07-04 NOTE — Progress Notes (Signed)
[facility] [address] [place]. [place] [address] [phone]       OFFICE FOLLOW-UP NOTE  [name] Date of Birth:  [birth year] Medical Record Number:  [id number]   HPI: 82 year old Caucasian lady seen today for follow-up following a stroke in May 2016.patient is accompanied by her oldest daughter, [name], today. She presented to the emergency room on 06/29/2014 with sudden onset of slurred speech. At baseline, the patient walks with a walker and requires assistance for some of her ADLs. A code stroke was called and TPA was administered. The patient was transferred to the neuro ICU for monitoring and further   treatment. I reviewed the hospital records. MRI showed left MCA territory small frontal and parietal embolic infarcts. MRA head was unremarkable. Carotid Doppler studies showed left-sided 40-59% ICA stenosis, echocardiogram showed no source of embolus. Hemoglobin A1c was 5.9. History of atrial flutter prompted starting the patient on Eliquis. She was residing at [facility] assisted living. She was discharged on 07/01/2014 with home health physical therapy. Low-dose statin was added because of LDL slightly above goal at 80. She was seen on follow-up by Dr. Frances Furbish on 09/21/14 and present treatment plan was continued. She states she's done well since then. She is had no recurrent stroke or TIA symptoms. She still mostly independent in activities of daily living and walks with a walker. Her memory is good and she has been on current dose of Razadyne for several years She feels she is back to her pre-stroke baseline. Her blood pressure is well controlled and today it is 116/70. She is tolerating eliquis quite well with only minor bruising. She is very careful with her walking and has not had any falls. Update 07/04/2015 : She returns for follow-up after last visit 6 months ago. She is accompanied by her daughter [name]. She continues to do well without recurrent stroke or  TIA symptoms. She remains on eliquis which is tolerating well without bleeding or bruising. She lives in [place]. She walks with a walker and is usually careful. She did however have a fall in December 2016 and sustained a hairline fracture of the left hip but has improved with conservative treatment and did not need surgery. She also developed right hip abscess in March 2017 for which required surgical drainage. She states her blood pressure is quite good and today it is 131/75. She she states her mild cognitive impairment is stable and her memory difficulties are not progressive. She takes Razadyne 8 mg daily which is tolerating well without side effects. He has no new neurological complaints. ROS:   14 system review of systems is positive for  constipation, tremors, walking difficulty, depression and all other systems negative PMH:  Past Medical History  Diagnosis Date  . Osteoporosis   . Hypertension   . Arthritis     neck  . Depression   . Anxiety   . Dysrhythmia     hx A-FLUTTER after hip surg April 2015- take metoprolol  . Shortness of breath     with activity  . Stroke [facility])     "mini stroke" per MRI - no deficiets   . Hip joint pain     RT  . Bruises easily   . Eczema   . Memory loss     Social History:  Social History   Social History  . Marital Status: Widowed    Spouse Name: N/A  . Number of Children: 3  . Years of Education:  69yr colleg   Occupational History  . Retired     Social History Main Topics  . Smoking status: Never Smoker   . Smokeless tobacco: Never Used  . Alcohol Use: No  . Drug Use: No  . Sexual Activity: No   Other Topics Concern  . Not on file   Social History Narrative   Drinks about 3 cups of coffee a day     Medications:   Current Outpatient Prescriptions on File Prior to Visit  Medication Sig Dispense Refill  . acetaminophen (TYLENOL) 500 MG tablet Take 1 tablet (500 mg total) by mouth 2 (two) times daily as needed for mild  pain or moderate pain. 30 tablet 0  . Acetaminophen-Codeine (TYLENOL/CODEINE #3) 300-30 MG tablet Take 1 tablet by mouth every 6 (six) hours. While awake.  Do not wake patient to give medication. 40 tablet 0  . amLODipine (NORVASC) 5 MG tablet Take 5 mg by mouth every morning.    Marland Kitchen antiseptic oral rinse (BIOTENE) LIQD 15 mLs by Mouth Rinse route 2 (two) times daily as needed for dry mouth.    Marland Kitchen apixaban (ELIQUIS) 2.5 MG TABS tablet Take 1 tablet (2.5 mg total) by mouth 2 (two) times daily. 60 tablet 2  . Biotin 5 MG CAPS Take 5 mg by mouth daily.    . bisacodyl (DULCOLAX) 5 MG EC tablet Take 5 mg by mouth daily as needed for moderate constipation.    . Calcium Citrate-Vitamin D (CITRACAL PETITES/VITAMIN D) 200-250 MG-UNIT TABS Take 1 tablet by mouth daily.    . chlorhexidine (PERIDEX) 0.12 % solution Use as directed 15 mLs in the mouth or throat 2 (two) times daily.    . citalopram (CELEXA) 10 MG tablet Take 30 mg by mouth daily.     Marland Kitchen docusate sodium (COLACE) 100 MG capsule Take 100 mg by mouth at bedtime.    Marland Kitchen galantamine (RAZADYNE ER) 8 MG 24 hr capsule Take 8 mg by mouth daily with breakfast.    . LORazepam (ATIVAN) 0.5 MG tablet Take one tablet by mouth every night at bedtime for rest (Patient taking differently: Take 0.5 mg by mouth 2 (two) times daily. Take one tablet by mouth every night at bedtime for rest and one at 0230p) 30 tablet 5  . metoprolol tartrate (LOPRESSOR) 25 MG tablet Take 50 mg by mouth every morning.     . Multiple Vitamins-Minerals (CERTAVITE SENIOR/ANTIOXIDANT PO) Take 1 capsule by mouth daily.    . polyethylene glycol (MIRALAX / GLYCOLAX) packet Take 17 g by mouth daily.    . polyvinyl alcohol (LIQUIFILM TEARS) 1.4 % ophthalmic solution Place 1 drop into both eyes 3 (three) times daily as needed for dry eyes.    . potassium chloride SA (K-DUR,KLOR-CON) 20 MEQ tablet Take 20 mEq by mouth every other day.    . psyllium (METAMUCIL) 58.6 % powder Take 1 packet by mouth 2  (two) times daily. Pt takes 1 tablespoon at each dose    . simvastatin (ZOCOR) 10 MG tablet Take 1 tablet (10 mg total) by mouth daily at 6 PM. 30 tablet 2  . traMADol (ULTRAM) 50 MG tablet Take 50 mg by mouth every 8 (eight) hours as needed for moderate pain.      No current facility-administered medications on file prior to visit.    Allergies:   Allergies  Allergen Reactions  . Flagyl [Metronidazole]     Fingers get "numb"    Physical Exam General: Frail elderly petite  Caucasian lady, seated, in no evident distress Head: head normocephalic and atraumatic.  Neck: supple with no carotid or supraclavicular bruits Cardiovascular: regular rate and rhythm, soft ejection murmur Musculoskeletal: no deformity. Mild kyphoscoliosis Skin:  no rash/petichiae Vascular:  Normal pulses all extremities Filed Vitals:   07/04/15 0959  BP: 131/75  Pulse: 57   Neurologic Exam Mental Status: Awake and fully alert. Oriented to place and time. Recent and remote memory intact. Recall 3/3.Diminished calculation. Attention span, concentration and fund of knowledge slightly diminished. Mood and affect appropriate. Animal naming 8. Cranial Nerves: Fundoscopic exam not done . Pupils equal, briskly reactive to light. Extraocular movements full without nystagmus. Visual fields full to confrontation. Hearing mildly diminished bilaterally. Facial sensation intact. Face, tongue, palate moves normally and symmetrically.  Motor: Diminished bulk and tone. Normal strength in all tested extremity muscles. Sensory.: intact to touch ,pinprick .position and vibratory sensation.  Coordination: Rapid alternating movements normal in all extremities. Finger-to-nose and heel-to-shin performed accurately bilaterally. Gait and Station: Arises from chair with t difficulty. Stance is stooped. Gait short steps and imbalance r Reflexes: 1+ and symmetric. Toes downgoing.      ASSESSMENT: 51 year Caucasian lady with embolic left  MCA branch infarcts in May 2016 secondary to atrial flutter  Long-standing history of mild cognitive impairment which is stable and long standing gait difficulty secondary to arthritis and hip surgery. Vascular risk factors of hypertension, hyperlipidemia, atrial flutter and stroke    PLAN: I had a long d/w patient and daughter [name] about her remote stroke and mild cognitive impairment, risk for recurrent stroke/TIAs, personally independently reviewed imaging studies and stroke evaluation results and answered questions.Continue Eliquis (apixaban) daily and No antithrombotic  for secondary stroke prevention and maintain strict control of hypertension with blood pressure goal below 130/90, diabetes with hemoglobin A1c goal below 6.5% and lipids with LDL cholesterol goal below 70 mg/dL. check screening carotid ultrasound study. I also advised the patient to continue Razadyne in current dose  as her cognitive impairment appears stable.and fall risk precautions and to use walker at all times. Greater than 50% time during this 25 minute visit was spent on counseling and coordination of care about her stroke risk and mild cognitive impairment.. Followup in the future with me in 1 year or call earlier if necessary.  Delia Heady, MD Note: This document was prepared with digital dictation and possible smart phrase technology. Any transcriptional errors that result from this process are unintentional

## 2015-07-04 NOTE — Patient Instructions (Signed)
I had a long d/w patient and daughter [name] about her remote stroke and mild cognitive impairment, risk for recurrent stroke/TIAs, personally independently reviewed imaging studies and stroke evaluation results and answered questions.Continue Eliquis (apixaban) daily and No antithrombotic  for secondary stroke prevention and maintain strict control of hypertension with blood pressure goal below 130/90, diabetes with hemoglobin A1c goal below 6.5% and lipids with LDL cholesterol goal below 70 mg/dL. check screening carotid ultrasound study. I also advised the patient to continue Razadyne in current dose  As her cognitive impairment appears stable.and fall risk precautions and to use walker at all times. Followup in the future with me in 1 year or call earlier if necessary.

## 2015-07-14 ENCOUNTER — Ambulatory Visit (INDEPENDENT_AMBULATORY_CARE_PROVIDER_SITE_OTHER): Payer: Medicare Other

## 2015-07-14 DIAGNOSIS — I6529 Occlusion and stenosis of unspecified carotid artery: Secondary | ICD-10-CM | POA: Diagnosis not present

## 2015-07-23 ENCOUNTER — Encounter: Payer: Self-pay | Admitting: Neurology

## 2015-07-26 ENCOUNTER — Telehealth: Payer: Self-pay

## 2015-07-26 NOTE — Telephone Encounter (Signed)
RN talk to daughter Candy on pts dpr form.Rn stated the dopplers that are done at [facility] are scan in,and it would not be on mychart. Rn explain that the carotid ultrasound study did not show any blockages on either side of the neck. Only mild age related hardening of the vessels. Pts daughter verbalized understanding. 

## 2015-11-01 ENCOUNTER — Ambulatory Visit (HOSPITAL_COMMUNITY)
Admission: RE | Admit: 2015-11-01 | Discharge: 2015-11-01 | Disposition: A | Discharge status: Home / Self Care | Payer: Medicare Other | Source: Ambulatory Visit | Attending: Internal Medicine | Admitting: Internal Medicine

## 2015-11-01 ENCOUNTER — Other Ambulatory Visit (HOSPITAL_COMMUNITY): Payer: Self-pay | Admitting: Internal Medicine

## 2015-11-01 ENCOUNTER — Encounter (HOSPITAL_COMMUNITY): Payer: Self-pay

## 2015-11-01 DIAGNOSIS — M810 Age-related osteoporosis without current pathological fracture: Secondary | ICD-10-CM | POA: Insufficient documentation

## 2015-11-01 HISTORY — DX: Pneumonia, unspecified organism: J18.9

## 2015-11-01 MED ORDER — DENOSUMAB 60 MG/ML ~~LOC~~ SOLN
60.0000 mg | Freq: Once | SUBCUTANEOUS | Status: AC
  Administered 2015-11-01: 60 mg via SUBCUTANEOUS
  Filled 2015-11-01: qty 1

## 2015-11-01 NOTE — Progress Notes (Signed)
Prolia 60mg  SQ given to left upper arm.  D/c instructions on Prolia given to pt and her caregiver.  Pt is from assisted living facility.  Next appointment also given to pt's caregiver for May 02, 2016 at 1100.  Pt d/c via wheelchair with caregiver to lobby.

## 2015-11-01 NOTE — Discharge Instructions (Signed)
Denosumab injection  What is this medicine?  DENOSUMAB (den oh sue mab) slows bone breakdown. Prolia is used to treat osteoporosis in women after menopause and in men. Xgeva is used to prevent bone fractures and other bone problems caused by cancer bone metastases. Xgeva is also used to treat giant cell tumor of the bone.  This medicine may be used for other purposes; ask your health care provider or pharmacist if you have questions.  What should I tell my health care provider before I take this medicine?  They need to know if you have any of these conditions:  -dental disease  -eczema  -infection or history of infections  -kidney disease or on dialysis  -low blood calcium or vitamin D  -malabsorption syndrome  -scheduled to have surgery or tooth extraction  -taking medicine that contains denosumab  -thyroid or parathyroid disease  -an unusual reaction to denosumab, other medicines, foods, dyes, or preservatives  -pregnant or trying to get pregnant  -breast-feeding  How should I use this medicine?  This medicine is for injection under the skin. It is given by a health care professional in a hospital or clinic setting.  If you are getting Prolia, a special MedGuide will be given to you by the pharmacist with each prescription and refill. Be sure to read this information carefully each time.  For Prolia, talk to your pediatrician regarding the use of this medicine in children. Special care may be needed. For Xgeva, talk to your pediatrician regarding the use of this medicine in children. While this drug may be prescribed for children as young as 13 years for selected conditions, precautions do apply.  Overdosage: If you think you have taken too much of this medicine contact a poison control center or emergency room at once.  NOTE: This medicine is only for you. Do not share this medicine with others.  What if I miss a dose?  It is important not to miss your dose. Call your doctor or health care professional if you are  unable to keep an appointment.  What may interact with this medicine?  Do not take this medicine with any of the following medications:  -other medicines containing denosumab  This medicine may also interact with the following medications:  -medicines that suppress the immune system  -medicines that treat cancer  -steroid medicines like prednisone or cortisone  This list may not describe all possible interactions. Give your health care provider a list of all the medicines, herbs, non-prescription drugs, or dietary supplements you use. Also tell them if you smoke, drink alcohol, or use illegal drugs. Some items may interact with your medicine.  What should I watch for while using this medicine?  Visit your doctor or health care professional for regular checks on your progress. Your doctor or health care professional may order blood tests and other tests to see how you are doing.  Call your doctor or health care professional if you get a cold or other infection while receiving this medicine. Do not treat yourself. This medicine may decrease your body's ability to fight infection.  You should make sure you get enough calcium and vitamin D while you are taking this medicine, unless your doctor tells you not to. Discuss the foods you eat and the vitamins you take with your health care professional.  See your dentist regularly. Brush and floss your teeth as directed. Before you have any dental work done, tell your dentist you are receiving this medicine.  Do   not become pregnant while taking this medicine or for 5 months after stopping it. Women should inform their doctor if they wish to become pregnant or think they might be pregnant. There is a potential for serious side effects to an unborn child. Talk to your health care professional or pharmacist for more information.  What side effects may I notice from receiving this medicine?  Side effects that you should report to your doctor or health care professional as soon as  possible:  -allergic reactions like skin rash, itching or hives, swelling of the face, lips, or tongue  -breathing problems  -chest pain  -fast, irregular heartbeat  -feeling faint or lightheaded, falls  -fever, chills, or any other sign of infection  -muscle spasms, tightening, or twitches  -numbness or tingling  -skin blisters or bumps, or is dry, peels, or red  -slow healing or unexplained pain in the mouth or jaw  -unusual bleeding or bruising  Side effects that usually do not require medical attention (Report these to your doctor or health care professional if they continue or are bothersome.):  -muscle pain  -stomach upset, gas  This list may not describe all possible side effects. Call your doctor for medical advice about side effects. You may report side effects to FDA at 1-800-FDA-1088.  Where should I keep my medicine?  This medicine is only given in a clinic, doctor's office, or other health care setting and will not be stored at home.  NOTE: This sheet is a summary. It may not cover all possible information. If you have questions about this medicine, talk to your doctor, pharmacist, or health care provider.      2016, Elsevier/Gold Standard. (2011-08-13 12:37:47)

## 2015-11-29 ENCOUNTER — Other Ambulatory Visit: Payer: Self-pay | Admitting: Internal Medicine

## 2015-12-06 ENCOUNTER — Other Ambulatory Visit: Payer: Self-pay | Admitting: Internal Medicine

## 2015-12-19 ENCOUNTER — Other Ambulatory Visit: Payer: Self-pay | Admitting: Internal Medicine

## 2016-01-03 ENCOUNTER — Other Ambulatory Visit: Payer: Self-pay | Admitting: Internal Medicine

## 2016-01-05 ENCOUNTER — Other Ambulatory Visit: Payer: Self-pay | Admitting: Internal Medicine

## 2016-04-10 ENCOUNTER — Other Ambulatory Visit: Payer: Self-pay | Admitting: Internal Medicine

## 2016-04-25 MED ORDER — DENOSUMAB 60 MG/ML ~~LOC~~ SOLN: 60.0000 mg | Freq: Once | SUBCUTANEOUS | Status: DC

## 2016-05-02 ENCOUNTER — Ambulatory Visit (HOSPITAL_COMMUNITY)
Admission: RE | Admit: 2016-05-02 | Discharge: 2016-05-02 | Disposition: A | Discharge status: Home / Self Care | Payer: Medicare Other | Source: Ambulatory Visit | Attending: Internal Medicine | Admitting: Internal Medicine

## 2016-06-06 ENCOUNTER — Ambulatory Visit (HOSPITAL_COMMUNITY)
Admission: RE | Admit: 2016-06-06 | Discharge: 2016-06-06 | Disposition: A | Discharge status: Home / Self Care | Payer: Medicare Other | Source: Ambulatory Visit | Attending: Internal Medicine | Admitting: Internal Medicine

## 2016-06-06 ENCOUNTER — Encounter (HOSPITAL_COMMUNITY): Payer: Self-pay

## 2016-06-06 DIAGNOSIS — M810 Age-related osteoporosis without current pathological fracture: Secondary | ICD-10-CM | POA: Diagnosis present

## 2016-06-06 MED ORDER — DENOSUMAB 60 MG/ML ~~LOC~~ SOLN
60.0000 mg | Freq: Once | SUBCUTANEOUS | Status: AC
  Administered 2016-06-06: 60 mg via SUBCUTANEOUS
  Filled 2016-06-06: qty 1

## 2016-06-06 NOTE — Discharge Instructions (Signed)
° ° °Prolia °Denosumab injection °What is this medicine? °DENOSUMAB (den oh sue mab) slows bone breakdown. Prolia is used to treat osteoporosis in women after menopause and in men. Xgeva is used to treat a high calcium level due to cancer and to prevent bone fractures and other bone problems caused by multiple myeloma or cancer bone metastases. Xgeva is also used to treat giant cell tumor of the bone. °This medicine may be used for other purposes; ask your health care provider or pharmacist if you have questions. °COMMON BRAND NAME(S): Prolia, XGEVA °What should I tell my health care provider before I take this medicine? °They need to know if you have any of these conditions: °-dental disease °-having surgery or tooth extraction °-infection °-kidney disease °-low levels of calcium or Vitamin D in the blood °-malnutrition °-on hemodialysis °-skin conditions or sensitivity °-thyroid or parathyroid disease °-an unusual reaction to denosumab, other medicines, foods, dyes, or preservatives °-pregnant or trying to get pregnant °-breast-feeding °How should I use this medicine? °This medicine is for injection under the skin. It is given by a health care professional in a hospital or clinic setting. °If you are getting Prolia, a special MedGuide will be given to you by the pharmacist with each prescription and refill. Be sure to read this information carefully each time. °For Prolia, talk to your pediatrician regarding the use of this medicine in children. Special care may be needed. For Xgeva, talk to your pediatrician regarding the use of this medicine in children. While this drug may be prescribed for children as young as 13 years for selected conditions, precautions do apply. °Overdosage: If you think you have taken too much of this medicine contact a poison control center or emergency room at once. °NOTE: This medicine is only for you. Do not share this medicine with others. °What if I miss a dose? °It is important not  to miss your dose. Call your doctor or health care professional if you are unable to keep an appointment. °What may interact with this medicine? °Do not take this medicine with any of the following medications: °-other medicines containing denosumab °This medicine may also interact with the following medications: °-medicines that lower your chance of fighting infection °-steroid medicines like prednisone or cortisone °This list may not describe all possible interactions. Give your health care provider a list of all the medicines, herbs, non-prescription drugs, or dietary supplements you use. Also tell them if you smoke, drink alcohol, or use illegal drugs. Some items may interact with your medicine. °What should I watch for while using this medicine? °Visit your doctor or health care professional for regular checks on your progress. Your doctor or health care professional may order blood tests and other tests to see how you are doing. °Call your doctor or health care professional for advice if you get a fever, chills or sore throat, or other symptoms of a cold or flu. Do not treat yourself. This drug may decrease your body's ability to fight infection. Try to avoid being around people who are sick. °You should make sure you get enough calcium and vitamin D while you are taking this medicine, unless your doctor tells you not to. Discuss the foods you eat and the vitamins you take with your health care professional. °See your dentist regularly. Brush and floss your teeth as directed. Before you have any dental work done, tell your dentist you are receiving this medicine. °Do not become pregnant while taking this medicine or for 5   after stopping it. Talk with your doctor or health care professional about your birth control options while taking this medicine. Women should inform their doctor if they wish to become pregnant or think they might be pregnant. There is a potential for serious side effects to an unborn  child. Talk to your health care professional or pharmacist for more information. What side effects may I notice from receiving this medicine? Side effects that you should report to your doctor or health care professional as soon as possible: -allergic reactions like skin rash, itching or hives, swelling of the face, lips, or tongue -bone pain -breathing problems -dizziness -jaw pain, especially after dental work -redness, blistering, peeling of the skin -signs and symptoms of infection like fever or chills; cough; sore throat; pain or trouble passing urine -signs of low calcium like fast heartbeat, muscle cramps or muscle pain; pain, tingling, numbness in the hands or feet; seizures -unusual bleeding or bruising -unusually weak or tired Side effects that usually do not require medical attention (report to your doctor or health care professional if they continue or are bothersome): -constipation -diarrhea -headache -joint pain -loss of appetite -muscle pain -runny nose -tiredness -upset stomach This list may not describe all possible side effects. Call your doctor for medical advice about side effects. You may report side effects to FDA at 1-800-FDA-1088. Where should I keep my medicine? This medicine is only given in a clinic, doctor's office, or other health care setting and will not be stored at home. NOTE: This sheet is a summary. It may not cover all possible information. If you have questions about this medicine, talk to your doctor, pharmacist, or health care provider.  2018 Elsevier/Gold Standard (2016-03-06 19:17:21)

## 2016-06-14 ENCOUNTER — Other Ambulatory Visit: Payer: Self-pay | Admitting: Internal Medicine

## 2016-07-02 ENCOUNTER — Other Ambulatory Visit: Payer: Self-pay | Admitting: Internal Medicine

## 2016-07-03 ENCOUNTER — Ambulatory Visit (INDEPENDENT_AMBULATORY_CARE_PROVIDER_SITE_OTHER): Payer: Medicare Other | Admitting: Neurology

## 2016-07-03 ENCOUNTER — Encounter: Payer: Self-pay | Admitting: Neurology

## 2016-07-03 VITALS — BP 119/82 | HR 115 | Wt 106.0 lb

## 2016-07-03 DIAGNOSIS — I6990 Unspecified sequelae of unspecified cerebrovascular disease: Secondary | ICD-10-CM | POA: Diagnosis not present

## 2016-07-03 DIAGNOSIS — I6529 Occlusion and stenosis of unspecified carotid artery: Secondary | ICD-10-CM | POA: Diagnosis not present

## 2016-07-03 NOTE — Patient Instructions (Signed)
I had a long discussion with the patient and her daughter Yolanda Baker regarding her remote stroke as well as mild memory loss both of which appear fairly stable. Recommend continue Razadyne 8 mg for her memory loss and eliquis for atrial flutter for stroke prevention. Maintain strict control of hypertension with blood pressure goal below 130/90. And lipids with LDL cholesterol goal below 70 mg percent. She was counseled to use a walker at all times for ambulation and he also discussed fall and safety precautions. Check screening follow-up carotid ultrasound study. No routine scheduled follow-up appointment with me is necessary but she may be referred back in the future as needed.

## 2016-07-03 NOTE — Progress Notes (Signed)
[facility] [address] [place]. [place] [address] [phone]       OFFICE FOLLOW-UP NOTE  [name] Date of Birth:  1923/04/03 Medical Record Number:  [id number]   HPI: 83 year old Caucasian lady seen today for follow-up following a stroke in May 2016.patient is accompanied by her oldest daughter, Yolanda Baker, today. She presented to the emergency room on 06/29/2014 with sudden onset of slurred speech. At baseline, the patient walks with a walker and requires assistance for some of her ADLs. A code stroke was called and TPA was administered. The patient was transferred to the neuro ICU for monitoring and further   treatment. I reviewed the hospital records. MRI showed left MCA territory small frontal and parietal embolic infarcts. MRA head was unremarkable. Carotid Doppler studies showed left-sided 40-59% ICA stenosis, echocardiogram showed no source of embolus. Hemoglobin A1c was 5.9. History of atrial flutter prompted starting the patient on Eliquis. She was residing at [facility] assisted living. She was discharged on 07/01/2014 with home health physical therapy. Low-dose statin was added because of LDL slightly above goal at 80. She was seen on follow-up by Dr. Frances Furbish on 09/21/14 and present treatment plan was continued. She states she's done well since then. She is had no recurrent stroke or TIA symptoms. She still mostly independent in activities of daily living and walks with a walker. Her memory is good and she has been on current dose of Razadyne for several years She feels she is back to her pre-stroke baseline. Her blood pressure is well controlled and today it is 116/70. She is tolerating eliquis quite well with only minor bruising. She is very careful with her walking and has not had any falls. Update 07/04/2015 : She returns for follow-up after last visit 6 months ago. She is accompanied by her daughter Yolanda Baker. She continues to do well without recurrent stroke or  TIA symptoms. She remains on eliquis which is tolerating well without bleeding or bruising. She lives in [place]. She walks with a walker and is usually careful. She did however have a fall in December 2016 and sustained a hairline fracture of the left hip but has improved with conservative treatment and did not need surgery. She also developed right hip abscess in March 2017 for which required surgical drainage. She states her blood pressure is quite good and today it is 131/75. She she states her mild cognitive impairment is stable and her memory difficulties are not progressive. She takes Razadyne 8 mg daily which is tolerating well without side effects. He has no new neurological complaints. Update 07/03/2016 ; she returns for follow-up after last visit a year ago. She is accompanied by her daughter Yolanda Baker. She continues to do well without recurrent stroke or TIA symptoms now since her stroke in May 2016. She remains on eliquis which is tolerating fairly well though she did have a mind and nosebleed a few weeks ago. She states her blood pressure is well controlled. She is tolerating Zocor well without muscle aches and pains. She had follow-up carotid ultrasound done on 07/14/15 which I have personally reviewed and showed no significant extra-axial cranial stenosis. She continues to have mild short-term memory difficulties with these appear to be stable. She is tolerating Razadyne 8 mg well without any side effects. She does ambulate carefully with a walker and uses a wheelchair for long distances. She has had no recent falls or injuries. She has no new complaints. ROS:   14 system review  of systems is positive for    tremors, walking difficulty, memory loss and all other systems negative PMH:  Past Medical History:  Diagnosis Date  . Anxiety   . Arthritis    neck  . Bruises easily   . Depression   . Dysrhythmia    hx A-FLUTTER after hip surg April 2015- take metoprolol  . Eczema   . Hip joint  pain    RT  . Hypertension   . Memory loss   . Osteoporosis   . Pneumonia    august 2017  . Shortness of breath    with activity  . Stroke [facility])    "mini stroke" per MRI - no deficiets     Social History:  Social History   Social History  . Marital status: Widowed    Spouse name: N/A  . Number of children: 3  . Years of education: 85yr colleg   Occupational History  . Retired     Social History Main Topics  . Smoking status: Never Smoker  . Smokeless tobacco: Never Used  . Alcohol use No  . Drug use: No  . Sexual activity: No   Other Topics Concern  . Not on file   Social History Narrative   Drinks about 3 cups of coffee a day     Medications:     Allergies:   Allergies  Allergen Reactions  . Flagyl [Metronidazole]     Fingers get "numb"    Physical Exam General: Frail elderly petite Caucasian lady, seated, in no evident distress Head: head normocephalic and atraumatic.  Neck: supple with no carotid or supraclavicular bruits Cardiovascular: regular rate and rhythm, soft ejection murmur Musculoskeletal: no deformity. Mild kyphoscoliosis Skin:  no rash/petichiae Vascular:  Normal pulses all extremities Vitals:   07/03/16 1000  BP: 119/82  Pulse: (!) 115   Neurologic Exam Mental Status: Awake and fully alert. Oriented to place and time. Recent and remote memory intact.    Diminished calculation. Attention span, concentration and fund of knowledge slightly diminished. Mood and affect appropriate. Animal naming 8. Cranial Nerves: Fundoscopic exam not done . Pupils equal, briskly reactive to light. Extraocular movements full without nystagmus. Visual fields full to confrontation. Hearing mildly diminished bilaterally. Facial sensation intact. Face, tongue, palate moves normally and symmetrically.  Motor: Diminished bulk and tone. Normal strength in all tested extremity muscles. Sensory.: intact to touch ,pinprick .position and vibratory sensation.    Coordination: Rapid alternating movements normal in all extremities. Finger-to-nose and heel-to-shin performed accurately bilaterally. Gait and Station: Arises from chair with mild difficulty. Stance is stooped. Gait short steps and imbalance r Reflexes: 1+ and symmetric. Toes downgoing.      ASSESSMENT: 45 year Caucasian lady with embolic left MCA branch infarcts in May 2016 secondary to atrial flutter  Long-standing history of mild cognitive impairment which is stable and long standing gait difficulty secondary to arthritis and hip surgery. Vascular risk factors of hypertension, hyperlipidemia, atrial flutter and stroke    PLAN: I had a long discussion with the patient and her daughter Yolanda Baker regarding her remote stroke as well as mild memory loss both of which appear fairly stable. Recommend continue Razadyne 8 mg for her memory loss and eliquis for atrial flutter for stroke prevention. Maintain strict control of hypertension with blood pressure goal below 130/90. And lipids with LDL cholesterol goal below 70 mg percent. She was counseled to use a walker at all times for ambulation and he also discussed fall and safety  precautions. Check screening follow-up carotid ultrasound study. No routine scheduled follow-up appointment with me is necessary but she may be referred back in the future as needed. Greater than 50% time during this 25 minute visit was spent on counseling and coordination of care about her stroke risk and mild cognitive impairment.Delia Heady, MD Note: This document was prepared with digital dictation and possible smart phrase technology. Any transcriptional errors that result from this process are unintentional

## 2016-07-10 ENCOUNTER — Other Ambulatory Visit: Payer: Self-pay | Admitting: Internal Medicine

## 2016-07-16 ENCOUNTER — Ambulatory Visit (HOSPITAL_COMMUNITY)
Admission: RE | Admit: 2016-07-16 | Discharge: 2016-07-16 | Disposition: A | Discharge status: Home / Self Care | Payer: Medicare Other | Source: Ambulatory Visit | Attending: Neurology | Admitting: Neurology

## 2016-07-16 DIAGNOSIS — I6529 Occlusion and stenosis of unspecified carotid artery: Secondary | ICD-10-CM | POA: Diagnosis present

## 2016-07-16 DIAGNOSIS — I6523 Occlusion and stenosis of bilateral carotid arteries: Secondary | ICD-10-CM | POA: Diagnosis not present

## 2016-07-16 LAB — VAS US CAROTID
LEFT ECA DIAS: 4 cm/s
LEFT VERTEBRAL DIAS: 16 cm/s
Left CCA dist dias: 16 cm/s
Left CCA dist sys: 45 cm/s
Left CCA prox dias: 9 cm/s
Left CCA prox sys: 49 cm/s
Left ICA dist dias: -22 cm/s
Left ICA dist sys: -110 cm/s
Left ICA prox dias: 16 cm/s
Left ICA prox sys: 50 cm/s
RIGHT ECA DIAS: -2 cm/s
RIGHT VERTEBRAL DIAS: -6 cm/s
Right CCA prox dias: 9 cm/s
Right CCA prox sys: 41 cm/s
Right cca dist sys: -72 cm/s

## 2016-07-16 NOTE — Progress Notes (Signed)
VASCULAR LAB PRELIMINARY  PRELIMINARY  PRELIMINARY  PRELIMINARY  Carotid duplex completed.    Preliminary report:  1-39% ICA stenosis.  Vertebral artery flow is antegrade.   Yolanda Baker, RVT 07/16/2016, 9:33 AM

## 2016-07-17 ENCOUNTER — Other Ambulatory Visit: Payer: Self-pay | Admitting: Internal Medicine

## 2016-07-18 ENCOUNTER — Other Ambulatory Visit: Payer: Self-pay | Admitting: Internal Medicine

## 2016-07-30 ENCOUNTER — Other Ambulatory Visit: Payer: Self-pay | Admitting: Internal Medicine

## 2016-07-31 ENCOUNTER — Other Ambulatory Visit: Payer: Self-pay | Admitting: Internal Medicine

## 2016-08-08 ENCOUNTER — Telehealth: Payer: Self-pay

## 2016-08-08 NOTE — Telephone Encounter (Signed)
Rn call patients daughter [name] that the carotid ultrasound was unremarkable. PTs daughter verbalized understanding.

## 2016-08-08 NOTE — Telephone Encounter (Signed)
-----   Message from Micki Riley, MD sent at 08/04/2016 12:28 PM EDT ----- Joneen Roach inform the patient had carotid ultrasound study was unremarkable

## 2016-12-07 ENCOUNTER — Ambulatory Visit (HOSPITAL_COMMUNITY): Payer: Medicare Other

## 2016-12-11 ENCOUNTER — Ambulatory Visit (HOSPITAL_COMMUNITY)
Admission: RE | Admit: 2016-12-11 | Discharge: 2016-12-11 | Disposition: A | Discharge status: Home / Self Care | Payer: Medicare Other | Source: Ambulatory Visit | Attending: Internal Medicine | Admitting: Internal Medicine

## 2016-12-11 ENCOUNTER — Encounter (HOSPITAL_COMMUNITY): Payer: Self-pay

## 2016-12-11 DIAGNOSIS — M810 Age-related osteoporosis without current pathological fracture: Secondary | ICD-10-CM | POA: Insufficient documentation

## 2016-12-11 MED ORDER — DENOSUMAB 60 MG/ML ~~LOC~~ SOLN
60.0000 mg | Freq: Once | SUBCUTANEOUS | Status: AC
Start: 2016-12-11 — End: 2016-12-11
  Administered 2016-12-11: 60 mg via SUBCUTANEOUS
  Filled 2016-12-11: qty 1

## 2016-12-11 NOTE — Progress Notes (Signed)
Prolia 60mg  SQ given to lt upper arm.  Pt has had drug before.  Pt caregiver was given education sheet on Prolia.  Pt lives in Assisted Living Facility, caregiver brought pt and signed for her.  Informed pt and pt caregiver to contact her MD, Dr. Waynard Edwards no later than the first week of April to find out the date/time and location of her next Prolia appointment.  Informed pt this department would be moving and those details are still being worked on and have not been finalized yet.  Pt caregiver voiced understanding.  Pt was d/c via wheelchair with her caregiver to the lobby.

## 2016-12-11 NOTE — Discharge Instructions (Signed)
FYI:  Please contact your MD about your next appointment date/time & location.  This unit will be going through some changes and possibly moving in the next couple of months.  Your next injection will be due in April 2019, so please contact your doctor at least by the first week of April.  You may have the injection after June 11, 2017.    Prolia (Denosumab) injection What is this medicine? DENOSUMAB (den oh sue mab) slows bone breakdown. Prolia is used to treat osteoporosis in women after menopause and in men. Delton See is used to treat a high calcium level due to cancer and to prevent bone fractures and other bone problems caused by multiple myeloma or cancer bone metastases. Delton See is also used to treat giant cell tumor of the bone. This medicine may be used for other purposes; ask your health care provider or pharmacist if you have questions. COMMON BRAND NAME(S): Prolia, XGEVA What should I tell my health care provider before I take this medicine? They need to know if you have any of these conditions: -dental disease -having surgery or tooth extraction -infection -kidney disease -low levels of calcium or Vitamin D in the blood -malnutrition -on hemodialysis -skin conditions or sensitivity -thyroid or parathyroid disease -an unusual reaction to denosumab, other medicines, foods, dyes, or preservatives -pregnant or trying to get pregnant -breast-feeding How should I use this medicine? This medicine is for injection under the skin. It is given by a health care professional in a hospital or clinic setting. If you are getting Prolia, a special MedGuide will be given to you by the pharmacist with each prescription and refill. Be sure to read this information carefully each time. For Prolia, talk to your pediatrician regarding the use of this medicine in children. Special care may be needed. For Delton See, talk to your pediatrician regarding the use of this medicine in children. While this drug may be  prescribed for children as young as 13 years for selected conditions, precautions do apply. Overdosage: If you think you have taken too much of this medicine contact a poison control center or emergency room at once. NOTE: This medicine is only for you. Do not share this medicine with others. What if I miss a dose? It is important not to miss your dose. Call your doctor or health care professional if you are unable to keep an appointment. What may interact with this medicine? Do not take this medicine with any of the following medications: -other medicines containing denosumab This medicine may also interact with the following medications: -medicines that lower your chance of fighting infection -steroid medicines like prednisone or cortisone This list may not describe all possible interactions. Give your health care provider a list of all the medicines, herbs, non-prescription drugs, or dietary supplements you use. Also tell them if you smoke, drink alcohol, or use illegal drugs. Some items may interact with your medicine. What should I watch for while using this medicine? Visit your doctor or health care professional for regular checks on your progress. Your doctor or health care professional may order blood tests and other tests to see how you are doing. Call your doctor or health care professional for advice if you get a fever, chills or sore throat, or other symptoms of a cold or flu. Do not treat yourself. This drug may decrease your body's ability to fight infection. Try to avoid being around people who are sick. You should make sure you get enough calcium and vitamin D  while you are taking this medicine, unless your doctor tells you not to. Discuss the foods you eat and the vitamins you take with your health care professional. See your dentist regularly. Brush and floss your teeth as directed. Before you have any dental work done, tell your dentist you are receiving this medicine. Do not  become pregnant while taking this medicine or for 5 months after stopping it. Talk with your doctor or health care professional about your birth control options while taking this medicine. Women should inform their doctor if they wish to become pregnant or think they might be pregnant. There is a potential for serious side effects to an unborn child. Talk to your health care professional or pharmacist for more information. What side effects may I notice from receiving this medicine? Side effects that you should report to your doctor or health care professional as soon as possible: -allergic reactions like skin rash, itching or hives, swelling of the face, lips, or tongue -bone pain -breathing problems -dizziness -jaw pain, especially after dental work -redness, blistering, peeling of the skin -signs and symptoms of infection like fever or chills; cough; sore throat; pain or trouble passing urine -signs of low calcium like fast heartbeat, muscle cramps or muscle pain; pain, tingling, numbness in the hands or feet; seizures -unusual bleeding or bruising -unusually weak or tired Side effects that usually do not require medical attention (report to your doctor or health care professional if they continue or are bothersome): -constipation -diarrhea -headache -joint pain -loss of appetite -muscle pain -runny nose -tiredness -upset stomach This list may not describe all possible side effects. Call your doctor for medical advice about side effects. You may report side effects to FDA at 1-800-FDA-1088. Where should I keep my medicine? This medicine is only given in a clinic, doctor's office, or other health care setting and will not be stored at home. NOTE: This sheet is a summary. It may not cover all possible information. If you have questions about this medicine, talk to your doctor, pharmacist, or health care provider.  2018 Elsevier/Gold Standard (2016-03-06 19:17:21)

## 2017-01-28 ENCOUNTER — Other Ambulatory Visit: Payer: Self-pay | Admitting: Internal Medicine

## 2017-02-12 ENCOUNTER — Other Ambulatory Visit: Payer: Self-pay | Admitting: Internal Medicine

## 2017-02-13 ENCOUNTER — Other Ambulatory Visit: Payer: Self-pay | Admitting: Internal Medicine

## 2017-02-27 ENCOUNTER — Telehealth: Payer: Self-pay | Admitting: Neurology

## 2017-02-27 NOTE — Telephone Encounter (Signed)
Pt's daughter called said the pt is at assisted living @ [facility] @ [facility]. She was called and advised that today at lunch the pt had an "episode" while sitting- her eyes rolled back, she threw her arms up, the pt told her daughter she did not not know anyone but all this passed very quickly. Her vitals were taken and everything was ok. The daughter was advised Dr Pearlean Brownie is not in the clinic and won't be back until 1/14. She said she wants to know if this is life threatening, is it an emergency?, can it wait until tomorrow? I advised her if she felt the pt needed to be seen to take her to the ED. The daughter said I just need some advise. I told her I could send a msg to work in RN/provider but without the pt being seen there is not much that they would be able to tell her. Pt was appreciative. Please call to advise

## 2017-02-27 NOTE — Telephone Encounter (Signed)
I have spoken with Candy, who sts. that it was reported to her that several hrs. ago, her mother, who is in assisted living, was eating lunch, had a few seconds of ? confusion, threw her arms in the air, then returned to baseline fairly quickly.  I have given the following advice: It is impossible to tell her exactly what occurred.  However, it sounds as if whatever happened was over quickly.  If indeed she is back to baseline, with no residual effects, it would be reasonable to monitor for a recurrence of sx.  If she feels pt. may have had a stroke, or is not back to normal as far as cognition, speech, strength, etc., ,she should be taken to the nearest ER.  Candy verbalized understanding of same/fim

## 2017-03-04 ENCOUNTER — Other Ambulatory Visit: Payer: Self-pay | Admitting: Internal Medicine

## 2017-03-11 ENCOUNTER — Other Ambulatory Visit: Payer: Self-pay | Admitting: Internal Medicine

## 2017-06-13 ENCOUNTER — Encounter (HOSPITAL_COMMUNITY): Payer: Self-pay

## 2017-06-13 ENCOUNTER — Ambulatory Visit (HOSPITAL_COMMUNITY)
Admission: RE | Admit: 2017-06-13 | Discharge: 2017-06-13 | Disposition: A | Discharge status: Home / Self Care | Payer: Medicare Other | Source: Ambulatory Visit | Attending: Internal Medicine | Admitting: Internal Medicine

## 2017-06-13 DIAGNOSIS — M810 Age-related osteoporosis without current pathological fracture: Secondary | ICD-10-CM | POA: Insufficient documentation

## 2017-06-13 MED ORDER — DENOSUMAB 60 MG/ML ~~LOC~~ SOLN
60.0000 mg | Freq: Once | SUBCUTANEOUS | Status: AC
  Administered 2017-06-13: 60 mg via SUBCUTANEOUS
  Filled 2017-06-13 (×2): qty 1

## 2017-06-13 NOTE — Discharge Instructions (Signed)
Call 911 for emergency  Call Md for any problems or questions.      Prolia  Denosumab injection What is this medicine? DENOSUMAB (den oh sue mab) slows bone breakdown. Prolia is used to treat osteoporosis in women after menopause and in men. Delton See is used to treat a high calcium level due to cancer and to prevent bone fractures and other bone problems caused by multiple myeloma or cancer bone metastases. Delton See is also used to treat giant cell tumor of the bone. This medicine may be used for other purposes; ask your health care provider or pharmacist if you have questions. COMMON BRAND NAME(S): Prolia, XGEVA What should I tell my health care provider before I take this medicine? They need to know if you have any of these conditions: -dental disease -having surgery or tooth extraction -infection -kidney disease -low levels of calcium or Vitamin D in the blood -malnutrition -on hemodialysis -skin conditions or sensitivity -thyroid or parathyroid disease -an unusual reaction to denosumab, other medicines, foods, dyes, or preservatives -pregnant or trying to get pregnant -breast-feeding How should I use this medicine? This medicine is for injection under the skin. It is given by a health care professional in a hospital or clinic setting. If you are getting Prolia, a special MedGuide will be given to you by the pharmacist with each prescription and refill. Be sure to read this information carefully each time. For Prolia, talk to your pediatrician regarding the use of this medicine in children. Special care may be needed. For Delton See, talk to your pediatrician regarding the use of this medicine in children. While this drug may be prescribed for children as young as 13 years for selected conditions, precautions do apply. Overdosage: If you think you have taken too much of this medicine contact a poison control center or emergency room at once. NOTE: This medicine is only for you. Do not  share this medicine with others. What if I miss a dose? It is important not to miss your dose. Call your doctor or health care professional if you are unable to keep an appointment. What may interact with this medicine? Do not take this medicine with any of the following medications: -other medicines containing denosumab This medicine may also interact with the following medications: -medicines that lower your chance of fighting infection -steroid medicines like prednisone or cortisone This list may not describe all possible interactions. Give your health care provider a list of all the medicines, herbs, non-prescription drugs, or dietary supplements you use. Also tell them if you smoke, drink alcohol, or use illegal drugs. Some items may interact with your medicine. What should I watch for while using this medicine? Visit your doctor or health care professional for regular checks on your progress. Your doctor or health care professional may order blood tests and other tests to see how you are doing. Call your doctor or health care professional for advice if you get a fever, chills or sore throat, or other symptoms of a cold or flu. Do not treat yourself. This drug may decrease your body's ability to fight infection. Try to avoid being around people who are sick. You should make sure you get enough calcium and vitamin D while you are taking this medicine, unless your doctor tells you not to. Discuss the foods you eat and the vitamins you take with your health care professional. See your dentist regularly. Brush and floss your teeth as directed. Before you have any dental work done, tell your dentist  you are receiving this medicine. Do not become pregnant while taking this medicine or for 5 months after stopping it. Talk with your doctor or health care professional about your birth control options while taking this medicine. Women should inform their doctor if they wish to become pregnant or think they  might be pregnant. There is a potential for serious side effects to an unborn child. Talk to your health care professional or pharmacist for more information. What side effects may I notice from receiving this medicine? Side effects that you should report to your doctor or health care professional as soon as possible: -allergic reactions like skin rash, itching or hives, swelling of the face, lips, or tongue -bone pain -breathing problems -dizziness -jaw pain, especially after dental work -redness, blistering, peeling of the skin -signs and symptoms of infection like fever or chills; cough; sore throat; pain or trouble passing urine -signs of low calcium like fast heartbeat, muscle cramps or muscle pain; pain, tingling, numbness in the hands or feet; seizures -unusual bleeding or bruising -unusually weak or tired Side effects that usually do not require medical attention (report to your doctor or health care professional if they continue or are bothersome): -constipation -diarrhea -headache -joint pain -loss of appetite -muscle pain -runny nose -tiredness -upset stomach This list may not describe all possible side effects. Call your doctor for medical advice about side effects. You may report side effects to FDA at 1-800-FDA-1088. Where should I keep my medicine? This medicine is only given in a clinic, doctor's office, or other health care setting and will not be stored at home. NOTE: This sheet is a summary. It may not cover all possible information. If you have questions about this medicine, talk to your doctor, pharmacist, or health care provider.  2018 Elsevier/Gold Standard (2016-03-06 19:17:21)

## 2017-09-23 ENCOUNTER — Other Ambulatory Visit: Payer: Self-pay | Admitting: Internal Medicine

## 2017-09-24 ENCOUNTER — Other Ambulatory Visit: Payer: Self-pay | Admitting: Internal Medicine

## 2017-10-01 ENCOUNTER — Other Ambulatory Visit: Payer: Self-pay | Admitting: Internal Medicine

## 2017-10-03 ENCOUNTER — Other Ambulatory Visit: Payer: Self-pay | Admitting: Internal Medicine

## 2017-12-16 ENCOUNTER — Encounter (HOSPITAL_COMMUNITY): Payer: Self-pay

## 2017-12-16 ENCOUNTER — Ambulatory Visit (HOSPITAL_COMMUNITY)
Admission: RE | Admit: 2017-12-16 | Discharge: 2017-12-16 | Disposition: A | Discharge status: Home / Self Care | Payer: Medicare Other | Source: Ambulatory Visit | Attending: Internal Medicine | Admitting: Internal Medicine

## 2017-12-16 DIAGNOSIS — M810 Age-related osteoporosis without current pathological fracture: Secondary | ICD-10-CM | POA: Insufficient documentation

## 2017-12-16 MED ORDER — DENOSUMAB 60 MG/ML ~~LOC~~ SOSY
60.0000 mg | PREFILLED_SYRINGE | Freq: Once | SUBCUTANEOUS | Status: AC
  Administered 2017-12-16: 60 mg via SUBCUTANEOUS
  Filled 2017-12-16: qty 1

## 2017-12-16 NOTE — Discharge Instructions (Signed)
Denosumab injection °What is this medicine? °DENOSUMAB (den oh sue mab) slows bone breakdown. Prolia is used to treat osteoporosis in women after menopause and in men. Xgeva is used to treat a high calcium level due to cancer and to prevent bone fractures and other bone problems caused by multiple myeloma or cancer bone metastases. Xgeva is also used to treat giant cell tumor of the bone. °This medicine may be used for other purposes; ask your health care provider or pharmacist if you have questions. °COMMON BRAND NAME(S): Prolia, XGEVA °What should I tell my health care provider before I take this medicine? °They need to know if you have any of these conditions: °-dental disease °-having surgery or tooth extraction °-infection °-kidney disease °-low levels of calcium or Vitamin D in the blood °-malnutrition °-on hemodialysis °-skin conditions or sensitivity °-thyroid or parathyroid disease °-an unusual reaction to denosumab, other medicines, foods, dyes, or preservatives °-pregnant or trying to get pregnant °-breast-feeding °How should I use this medicine? °This medicine is for injection under the skin. It is given by a health care professional in a hospital or clinic setting. °If you are getting Prolia, a special MedGuide will be given to you by the pharmacist with each prescription and refill. Be sure to read this information carefully each time. °For Prolia, talk to your pediatrician regarding the use of this medicine in children. Special care may be needed. For Xgeva, talk to your pediatrician regarding the use of this medicine in children. While this drug may be prescribed for children as young as 13 years for selected conditions, precautions do apply. °Overdosage: If you think you have taken too much of this medicine contact a poison control center or emergency room at once. °NOTE: This medicine is only for you. Do not share this medicine with others. °What if I miss a dose? °It is important not to miss your  dose. Call your doctor or health care professional if you are unable to keep an appointment. °What may interact with this medicine? °Do not take this medicine with any of the following medications: °-other medicines containing denosumab °This medicine may also interact with the following medications: °-medicines that lower your chance of fighting infection °-steroid medicines like prednisone or cortisone °This list may not describe all possible interactions. Give your health care provider a list of all the medicines, herbs, non-prescription drugs, or dietary supplements you use. Also tell them if you smoke, drink alcohol, or use illegal drugs. Some items may interact with your medicine. °What should I watch for while using this medicine? °Visit your doctor or health care professional for regular checks on your progress. Your doctor or health care professional may order blood tests and other tests to see how you are doing. °Call your doctor or health care professional for advice if you get a fever, chills or sore throat, or other symptoms of a cold or flu. Do not treat yourself. This drug may decrease your body's ability to fight infection. Try to avoid being around people who are sick. °You should make sure you get enough calcium and vitamin D while you are taking this medicine, unless your doctor tells you not to. Discuss the foods you eat and the vitamins you take with your health care professional. °See your dentist regularly. Brush and floss your teeth as directed. Before you have any dental work done, tell your dentist you are receiving this medicine. °Do not become pregnant while taking this medicine or for 5 months after stopping   it. Talk with your doctor or health care professional about your birth control options while taking this medicine. Women should inform their doctor if they wish to become pregnant or think they might be pregnant. There is a potential for serious side effects to an unborn child. Talk  to your health care professional or pharmacist for more information. What side effects may I notice from receiving this medicine? Side effects that you should report to your doctor or health care professional as soon as possible: -allergic reactions like skin rash, itching or hives, swelling of the face, lips, or tongue -bone pain -breathing problems -dizziness -jaw pain, especially after dental work -redness, blistering, peeling of the skin -signs and symptoms of infection like fever or chills; cough; sore throat; pain or trouble passing urine -signs of low calcium like fast heartbeat, muscle cramps or muscle pain; pain, tingling, numbness in the hands or feet; seizures -unusual bleeding or bruising -unusually weak or tired Side effects that usually do not require medical attention (report to your doctor or health care professional if they continue or are bothersome): -constipation -diarrhea -headache -joint pain -loss of appetite -muscle pain -runny nose -tiredness -upset stomach This list may not describe all possible side effects. Call your doctor for medical advice about side effects. You may report side effects to FDA at 1-800-FDA-1088. Where should I keep my medicine? This medicine is only given in a clinic, doctor's office, or other health care setting and will not be stored at home. NOTE: This sheet is a summary. It may not cover all possible information. If you have questions about this medicine, talk to your doctor, pharmacist, or health care provider.  2018 Elsevier/Gold Standard (2016-03-06 19:17:21)

## 2017-12-16 NOTE — Progress Notes (Signed)
prolia given, next appointment and prolia info.sheet given to pt caregiver.  Pt has had several times before.  Pt d/c by wheelchair with caregiver.

## 2018-01-15 ENCOUNTER — Encounter: Payer: Self-pay | Admitting: Emergency Medicine

## 2018-01-15 DIAGNOSIS — F259 Schizoaffective disorder, unspecified: Secondary | ICD-10-CM

## 2018-01-15 DIAGNOSIS — F411 Generalized anxiety disorder: Secondary | ICD-10-CM | POA: Insufficient documentation

## 2018-01-30 ENCOUNTER — Encounter: Payer: Self-pay | Admitting: Psychiatry

## 2018-01-30 ENCOUNTER — Ambulatory Visit (INDEPENDENT_AMBULATORY_CARE_PROVIDER_SITE_OTHER): Payer: Medicare Other | Admitting: Psychiatry

## 2018-01-30 DIAGNOSIS — F411 Generalized anxiety disorder: Secondary | ICD-10-CM | POA: Diagnosis not present

## 2018-01-30 DIAGNOSIS — G2401 Drug induced subacute dyskinesia: Secondary | ICD-10-CM

## 2018-01-30 DIAGNOSIS — T43505A Adverse effect of unspecified antipsychotics and neuroleptics, initial encounter: Secondary | ICD-10-CM | POA: Diagnosis not present

## 2018-01-30 DIAGNOSIS — F250 Schizoaffective disorder, bipolar type: Secondary | ICD-10-CM

## 2018-01-30 NOTE — Progress Notes (Signed)
Yolanda Baker [id number] 1924-01-12 84 y.o.  Subjective:   Patient ID:  Yolanda Baker is a 84 y.o. (DOB 1923/08/16) female.  Chief Complaint:  Chief Complaint  Patient presents with  . Follow-up    psychosis and moood    HPI Yolanda Baker presents to the office today for follow-up of schizoaffective disorder and paranoia and anxiety.  No unusual fear..  Sleep good. Not sad.  Some Anxiety for no reason.  Feels welll treated at the facility, called [facility].  No problems with the meds.  Appetite and weight stable.  Able to feed herself so tremor is manageable.   Review of Systems:  Review of Systems  Respiratory: Positive for chest tightness and shortness of breath.   Neurological: Positive for tremors and weakness.  Psychiatric/Behavioral: Negative for agitation, behavioral problems, confusion, decreased concentration, dysphoric mood, hallucinations, self-injury, sleep disturbance and suicidal ideas. The patient is nervous/anxious. The patient is not hyperactive.   WC bound usually.  Can use walker short didstances.  Medications: I have reviewed the patient's current medications.    Medication Side Effects: None  Allergies:  Allergies  Allergen Reactions  . Flagyl [Metronidazole]     Fingers get "numb"    Past Medical History:  Diagnosis Date  . Anxiety   . Arthritis    neck  . Bruises easily   . Depression   . Dysrhythmia    hx A-FLUTTER after hip surg April 2015- take metoprolol  . Eczema   . Hip joint pain    RT  . Hypertension   . Memory loss   . Osteoporosis   . Pneumonia    august 2017  . Shortness of breath    with activity  . Stroke [facility])    "mini stroke" per MRI - no deficiets     Family History  Problem Relation Age of Onset  . Heart attack Father   . Hypertension Father   . Hypertension Sister   . Stroke Sister   . Hypertension Brother     Social History   Socioeconomic History  . Marital status: Widowed    Spouse name: Not on  file  . Number of children: 3  . Years of education: 67yr colleg  . Highest education level: Not on file  Occupational History  . Occupation: Retired   Engineer, production  . Financial resource strain: Not on file  . Food insecurity:    Worry: Not on file    Inability: Not on file  . Transportation needs:    Medical: Not on file    Non-medical: Not on file  Tobacco Use  . Smoking status: Never Smoker  . Smokeless tobacco: Never Used  Substance and Sexual Activity  . Alcohol use: No    Alcohol/week: 0.0 standard drinks  . Drug use: No  . Sexual activity: Never  Lifestyle  . Physical activity:    Days per week: Not on file    Minutes per session: Not on file  . Stress: Not on file  Relationships  . Social connections:    Talks on phone: Not on file    Gets together: Not on file    Attends religious service: Not on file    Active member of club or organization: Not on file    Attends meetings of clubs or organizations: Not on file    Relationship status: Not on file  . Intimate partner violence:    Fear of current or ex partner: Not on  file    Emotionally abused: Not on file    Physically abused: Not on file    Forced sexual activity: Not on file  Other Topics Concern  . Not on file  Social History Narrative   Drinks about 3 cups of coffee a day     Past Medical History, Surgical history, Social history, and Family history were reviewed and updated as appropriate.   Please see review of systems for further details on the patient's review from today.   Objective:   Physical Exam:  There were no vitals taken for this visit.  Physical Exam  Neurological: She displays no tremor. Gait normal.  Psychiatric: Judgment normal. Her mood appears anxious. Her affect is not angry. Her speech is not rapid and/or pressured and not slurred. She is slowed. She is not agitated, not aggressive and not actively hallucinating. Thought content is not paranoid. She does not exhibit a depressed  mood. She expresses no homicidal and no suicidal ideation. She is noncommunicative. She exhibits abnormal recent memory.  Insight fair.  Some cognitive decline c/w age. No paranoia. Mild facial tension stable. She is attentive.    Lab Review:     Component Value Date/Time   NA 138 03/02/2015 0544   K 3.4 (L) 03/02/2015 0544   CL 104 03/02/2015 0544   CO2 27 03/02/2015 0544   GLUCOSE 102 (H) 03/02/2015 0544   BUN 9 03/02/2015 0544   CREATININE 0.79 03/02/2015 0544   CALCIUM 8.3 (L) 03/02/2015 0544   PROT 7.0 06/29/2014 0925   ALBUMIN 3.4 (L) 06/29/2014 0925   AST 56 (H) 06/29/2014 0925   ALT 38 06/29/2014 0925   ALKPHOS 79 06/29/2014 0925   BILITOT 0.8 06/29/2014 0925   GFRNONAA >60 03/02/2015 0544   GFRAA >60 03/02/2015 0544       Component Value Date/Time   WBC 5.0 03/02/2015 0544   RBC 3.37 (L) 03/02/2015 0544   HGB 10.3 (L) 03/02/2015 0544   HCT 32.0 (L) 03/02/2015 0544   PLT 354 03/02/2015 0544   MCV 95.0 03/02/2015 0544   MCH 30.6 03/02/2015 0544   MCHC 32.2 03/02/2015 0544   RDW 14.8 03/02/2015 0544   LYMPHSABS 2.1 03/02/2015 0544   MONOABS 0.8 03/02/2015 0544   EOSABS 0.1 03/02/2015 0544   BASOSABS 0.0 03/02/2015 0544    No results found for: POCLITH, LITHIUM   No results found for: PHENYTOIN, PHENOBARB, VALPROATE, CBMZ   .res Assessment: Plan:    Schizoaffective disorder, bipolar type (HCC)  Generalized anxiety disorder  Neuroleptic-induced tardive dyskinesia   All symptoms are under good control with only mild TD without dysfunction related nor discomfort.  No indication for changes.  She's on a low dosage of Geodon and the risk of change is greater than the advantages that might be gained.  Discussed potential metabolic side effects associated with atypical antipsychotics, as well as potential risk for movement side effects. Advised pt to contact office if movement side effects occur.   We discussed the short-term risks associated with  benzodiazepines including sedation and increased fall risk among others.  Discussed long-term side effect risk including dependence, potential withdrawal symptoms, and the potential eventual dose-related risk of dementia. Use the LED.  Cannot care for herself.  Needs asst living.  This appt was 30 mins.-  Seen with daughter.  follow up  6-9 mos  Meredith Staggers, MD, DFAPA   Please see After Visit Summary for patient specific instructions.  Future Appointments  Date Time Provider  Department Center  06/19/2018 10:00 AM [facility] ROOM [facility] None    No orders of the defined types were placed in this encounter.     -------------------------------

## 2018-06-19 ENCOUNTER — Ambulatory Visit (HOSPITAL_COMMUNITY): Payer: Medicare Other

## 2018-07-25 ENCOUNTER — Ambulatory Visit (HOSPITAL_COMMUNITY)
Admission: RE | Admit: 2018-07-25 | Discharge: 2018-07-25 | Disposition: A | Discharge status: Home / Self Care | Payer: Medicare Other | Source: Ambulatory Visit | Attending: Internal Medicine | Admitting: Internal Medicine

## 2018-07-25 ENCOUNTER — Encounter (HOSPITAL_COMMUNITY): Payer: Medicare Other

## 2018-07-25 ENCOUNTER — Ambulatory Visit (HOSPITAL_COMMUNITY): Payer: Medicare Other

## 2018-07-25 ENCOUNTER — Encounter (HOSPITAL_COMMUNITY): Payer: Self-pay

## 2018-07-25 ENCOUNTER — Other Ambulatory Visit: Payer: Self-pay

## 2018-07-25 DIAGNOSIS — M810 Age-related osteoporosis without current pathological fracture: Secondary | ICD-10-CM | POA: Insufficient documentation

## 2018-07-25 MED ORDER — DENOSUMAB 60 MG/ML ~~LOC~~ SOSY
PREFILLED_SYRINGE | SUBCUTANEOUS | Status: AC
  Administered 2018-07-25: 60 mg via SUBCUTANEOUS
  Filled 2018-07-25: qty 1

## 2018-07-25 MED ORDER — DENOSUMAB 60 MG/ML ~~LOC~~ SOSY
60.0000 mg | PREFILLED_SYRINGE | Freq: Once | SUBCUTANEOUS | Status: AC
  Administered 2018-07-25: 60 mg via SUBCUTANEOUS

## 2018-07-25 NOTE — Discharge Instructions (Signed)
Denosumab injection °What is this medicine? °DENOSUMAB (den oh sue mab) slows bone breakdown. Prolia is used to treat osteoporosis in women after menopause and in men, and in people who are taking corticosteroids for 6 months or more. Xgeva is used to treat a high calcium level due to cancer and to prevent bone fractures and other bone problems caused by multiple myeloma or cancer bone metastases. Xgeva is also used to treat giant cell tumor of the bone. °This medicine may be used for other purposes; ask your health care provider or pharmacist if you have questions. °COMMON BRAND NAME(S): Prolia, XGEVA °What should I tell my health care provider before I take this medicine? °They need to know if you have any of these conditions: °-dental disease °-having surgery or tooth extraction °-infection °-kidney disease °-low levels of calcium or Vitamin D in the blood °-malnutrition °-on hemodialysis °-skin conditions or sensitivity °-thyroid or parathyroid disease °-an unusual reaction to denosumab, other medicines, foods, dyes, or preservatives °-pregnant or trying to get pregnant °-breast-feeding °How should I use this medicine? °This medicine is for injection under the skin. It is given by a health care professional in a hospital or clinic setting. °A special MedGuide will be given to you before each treatment. Be sure to read this information carefully each time. °For Prolia, talk to your pediatrician regarding the use of this medicine in children. Special care may be needed. For Xgeva, talk to your pediatrician regarding the use of this medicine in children. While this drug may be prescribed for children as young as 13 years for selected conditions, precautions do apply. °Overdosage: If you think you have taken too much of this medicine contact a poison control center or emergency room at once. °NOTE: This medicine is only for you. Do not share this medicine with others. °What if I miss a dose? °It is important not to  miss your dose. Call your doctor or health care professional if you are unable to keep an appointment. °What may interact with this medicine? °Do not take this medicine with any of the following medications: °-other medicines containing denosumab °This medicine may also interact with the following medications: °-medicines that lower your chance of fighting infection °-steroid medicines like prednisone or cortisone °This list may not describe all possible interactions. Give your health care provider a list of all the medicines, herbs, non-prescription drugs, or dietary supplements you use. Also tell them if you smoke, drink alcohol, or use illegal drugs. Some items may interact with your medicine. °What should I watch for while using this medicine? °Visit your doctor or health care professional for regular checks on your progress. Your doctor or health care professional may order blood tests and other tests to see how you are doing. °Call your doctor or health care professional for advice if you get a fever, chills or sore throat, or other symptoms of a cold or flu. Do not treat yourself. This drug may decrease your body's ability to fight infection. Try to avoid being around people who are sick. °You should make sure you get enough calcium and vitamin D while you are taking this medicine, unless your doctor tells you not to. Discuss the foods you eat and the vitamins you take with your health care professional. °See your dentist regularly. Brush and floss your teeth as directed. Before you have any dental work done, tell your dentist you are receiving this medicine. °Do not become pregnant while taking this medicine or for 5 months   after stopping it. Talk with your doctor or health care professional about your birth control options while taking this medicine. Women should inform their doctor if they wish to become pregnant or think they might be pregnant. There is a potential for serious side effects to an unborn  child. Talk to your health care professional or pharmacist for more information. °What side effects may I notice from receiving this medicine? °Side effects that you should report to your doctor or health care professional as soon as possible: °-allergic reactions like skin rash, itching or hives, swelling of the face, lips, or tongue °-bone pain °-breathing problems °-dizziness °-jaw pain, especially after dental work °-redness, blistering, peeling of the skin °-signs and symptoms of infection like fever or chills; cough; sore throat; pain or trouble passing urine °-signs of low calcium like fast heartbeat, muscle cramps or muscle pain; pain, tingling, numbness in the hands or feet; seizures °-unusual bleeding or bruising °-unusually weak or tired °Side effects that usually do not require medical attention (report to your doctor or health care professional if they continue or are bothersome): °-constipation °-diarrhea °-headache °-joint pain °-loss of appetite °-muscle pain °-runny nose °-tiredness °-upset stomach °This list may not describe all possible side effects. Call your doctor for medical advice about side effects. You may report side effects to FDA at 1-800-FDA-1088. °Where should I keep my medicine? °This medicine is only given in a clinic, doctor's office, or other health care setting and will not be stored at home. °NOTE: This sheet is a summary. It may not cover all possible information. If you have questions about this medicine, talk to your doctor, pharmacist, or health care provider. °© 2019 Elsevier/Gold Standard (2017-06-21 16:10:44) ° °

## 2018-09-30 ENCOUNTER — Ambulatory Visit (INDEPENDENT_AMBULATORY_CARE_PROVIDER_SITE_OTHER): Payer: Medicare Other | Admitting: Psychiatry

## 2018-09-30 ENCOUNTER — Encounter: Payer: Self-pay | Admitting: Psychiatry

## 2018-09-30 ENCOUNTER — Other Ambulatory Visit: Payer: Self-pay

## 2018-09-30 DIAGNOSIS — F250 Schizoaffective disorder, bipolar type: Secondary | ICD-10-CM | POA: Diagnosis not present

## 2018-09-30 DIAGNOSIS — G2401 Drug induced subacute dyskinesia: Secondary | ICD-10-CM | POA: Diagnosis not present

## 2018-09-30 DIAGNOSIS — F411 Generalized anxiety disorder: Secondary | ICD-10-CM | POA: Diagnosis not present

## 2018-09-30 NOTE — Progress Notes (Signed)
Yolanda Baker [id number] 09-16-1923 85 y.o.   Virtual Visit via Telephone Note  I connected with pt by telephone and verified that I am speaking with the correct person using two identifiers.   I discussed the limitations, risks, security and privacy concerns of performing an evaluation and management service by telephone and the availability of in person appointments. I also discussed with the patient that there may be a patient responsible charge related to this service. The patient expressed understanding and agreed to proceed.  I discussed the assessment and treatment plan with the patient. The patient was provided an opportunity to ask questions and all were answered. The patient agreed with the plan and demonstrated an understanding of the instructions.   The patient was advised to call back or seek an in-person evaluation if the symptoms worsen or if the condition fails to improve as anticipated.  I provided 20 minutes of non-face-to-face time during this encounter. The call started at 940  and ended at 10:00 . The patient was located at home and the provider was located office. Participated in 3 Doss call with her daughter Drue FlirtCandy.  Subjective:   Patient ID:  Yolanda Dagoarlene S Bisesi is a 85 y.o. (DOB 09-16-1923) female.  Chief Complaint:  Chief Complaint  Patient presents with  . Follow-up    Medication Management  . Anxiety    Medication Management  . Depression    Medication Management    Anxiety Symptoms include nervous/anxious behavior and shortness of breath. Patient reports no confusion, decreased concentration or suicidal ideas.    Depression        Associated symptoms include no decreased concentration and no suicidal ideas.  Past medical history includes anxiety.    Yolanda DagoEarlene S Lebow presents to the office today for follow-up of schizoaffective disorder and paranoia and anxiety.    Last seen December.  No meds changed.  [facility].  Eats in her room.  Not seeing  others except if needs something.  Gets lonely but managing.  Feels OK about the same. No unusual fear..  Sleep good. Not sad.  Some Anxiety for no reason.  Feels welll treated at the facility, [facility].  No problems with the meds.  Appetite and weight stable.  Able to feed herself so tremor is manageable.  Routine schedule.  No SI.  PCP conference call without med changes.  Ziprasidone reduced from 80 to 60 mg daily in April 2017.  Past Psychiatric Medication Trials: risperidone, Zoloft, citalopram 40, ziprasidone   Review of Systems:  Review of Systems  Respiratory: Positive for shortness of breath. Negative for chest tightness.   Neurological: Positive for tremors and weakness.  Psychiatric/Behavioral: Positive for depression. Negative for agitation, behavioral problems, confusion, decreased concentration, dysphoric mood, hallucinations, self-injury, sleep disturbance and suicidal ideas. The patient is nervous/anxious. The patient is not hyperactive.   WC bound usually.  Can use walker short didstances.  Medications: I have reviewed the patient's current medications.  Medication Side Effects: None  Allergies:  Allergies  Allergen Reactions  . Flagyl [Metronidazole]     Fingers get "numb"    Past Medical History:  Diagnosis Date  . Anxiety   . Arthritis    neck  . Bruises easily   . Depression   . Dysrhythmia    hx A-FLUTTER after hip surg April 2015- take metoprolol  . Eczema   . Hip joint pain    RT  . Hypertension   . Memory loss   .  Osteoporosis   . Pneumonia    august 2017  . Shortness of breath    with activity  . Stroke [facility])    "mini stroke" per MRI - no deficiets     Family History  Problem Relation Age of Onset  . Heart attack Father   . Hypertension Father   . Hypertension Sister   . Stroke Sister   . Hypertension Brother     Social History   Socioeconomic History  . Marital status: Widowed    Spouse name: Not on file  .  Number of children: 3  . Years of education: 67yr colleg  . Highest education level: Not on file  Occupational History  . Occupation: Retired   Scientific laboratory technician  . Financial resource strain: Not on file  . Food insecurity    Worry: Not on file    Inability: Not on file  . Transportation needs    Medical: Not on file    Non-medical: Not on file  Tobacco Use  . Smoking status: Never Smoker  . Smokeless tobacco: Never Used  Substance and Sexual Activity  . Alcohol use: No    Alcohol/week: 0.0 standard drinks  . Drug use: No  . Sexual activity: Never  Lifestyle  . Physical activity    Days per week: Not on file    Minutes per session: Not on file  . Stress: Not on file  Relationships  . Social Herbalist on phone: Not on file    Gets together: Not on file    Attends religious service: Not on file    Active member of club or organization: Not on file    Attends meetings of clubs or organizations: Not on file    Relationship status: Not on file  . Intimate partner violence    Fear of current or ex partner: Not on file    Emotionally abused: Not on file    Physically abused: Not on file    Forced sexual activity: Not on file  Other Topics Concern  . Not on file  Social History Narrative   Drinks about 3 cups of coffee a day     Past Medical History, Surgical history, Social history, and Family history were reviewed and updated as appropriate.   Please see review of systems for further details on the patient's review from today.   Objective:   Physical Exam:  There were no vitals taken for this visit.  Physical Exam Neurological:     Mental Status: She is alert and oriented to person, place, and time.     Cranial Nerves: No dysarthria.  Psychiatric:        Attention and Perception: Attention normal.        Mood and Affect: Mood is anxious. Affect is blunt.        Speech: Speech normal.        Behavior: Behavior is cooperative.        Thought Content:  Thought content normal. Thought content is not paranoid or delusional. Thought content does not include homicidal or suicidal ideation. Thought content does not include homicidal or suicidal plan.        Cognition and Memory: Cognition and memory normal.        Judgment: Judgment normal.     Comments: Cognition reasonable for age. No fear elecitied.      Lab Review:     Component Value Date/Time   NA 138 03/02/2015 0544   K  3.4 (L) 03/02/2015 0544   CL 104 03/02/2015 0544   CO2 27 03/02/2015 0544   GLUCOSE 102 (H) 03/02/2015 0544   BUN 9 03/02/2015 0544   CREATININE 0.79 03/02/2015 0544   CALCIUM 8.3 (L) 03/02/2015 0544   PROT 7.0 06/29/2014 0925   ALBUMIN 3.4 (L) 06/29/2014 0925   AST 56 (H) 06/29/2014 0925   ALT 38 06/29/2014 0925   ALKPHOS 79 06/29/2014 0925   BILITOT 0.8 06/29/2014 0925   GFRNONAA >60 03/02/2015 0544   GFRAA >60 03/02/2015 0544       Component Value Date/Time   WBC 5.0 03/02/2015 0544   RBC 3.37 (L) 03/02/2015 0544   HGB 10.3 (L) 03/02/2015 0544   HCT 32.0 (L) 03/02/2015 0544   PLT 354 03/02/2015 0544   MCV 95.0 03/02/2015 0544   MCH 30.6 03/02/2015 0544   MCHC 32.2 03/02/2015 0544   RDW 14.8 03/02/2015 0544   LYMPHSABS 2.1 03/02/2015 0544   MONOABS 0.8 03/02/2015 0544   EOSABS 0.1 03/02/2015 0544   BASOSABS 0.0 03/02/2015 0544    No results found for: POCLITH, LITHIUM   No results found for: PHENYTOIN, PHENOBARB, VALPROATE, CBMZ   .res Assessment: Plan:    Pansy was seen today for follow-up, anxiety and depression.  Diagnoses and all orders for this visit:  Schizoaffective disorder, bipolar type (HCC)  Generalized anxiety disorder  Neuroleptic-induced tardive dyskinesia   All symptoms are under good control with only mild TD without dysfunction related nor discomfort.  No indication for changes.  She's on a low dosage of Geodon and the risk of change is greater than the advantages that might be gained.  History of paranoid  psychosis managed and TD is no worse.  Anxiety managed.  Discussed potential metabolic side effects associated with atypical antipsychotics, as well as potential risk for movement side effects. Advised pt to contact office if movement side effects occur.   We discussed the short-term risks associated with benzodiazepines including sedation and increased fall risk among others.  Discussed long-term side effect risk including dependence, potential withdrawal symptoms, and the potential eventual dose-related risk of dementia. Use the LED.  Cannot care for herself.  Needs asst living.  No med changes.   This appt was 30 mins.- with daughter.  follow up  6-9 mos bc stable  Meredith Staggers, MD, DFAPA   Please see After Visit Summary for patient specific instructions.  Future Appointments  Date Time Provider Department Center  01/28/2019 10:00 AM [facility] ROOM [facility] None    No orders of the defined types were placed in this encounter.     -------------------------------

## 2019-01-28 ENCOUNTER — Other Ambulatory Visit: Payer: Self-pay

## 2019-01-28 ENCOUNTER — Encounter (HOSPITAL_COMMUNITY): Payer: Self-pay

## 2019-01-28 ENCOUNTER — Ambulatory Visit (HOSPITAL_COMMUNITY)
Admission: RE | Admit: 2019-01-28 | Discharge: 2019-01-28 | Disposition: A | Discharge status: Home / Self Care | Payer: Medicare Other | Source: Ambulatory Visit | Attending: Internal Medicine | Admitting: Internal Medicine

## 2019-01-28 DIAGNOSIS — M810 Age-related osteoporosis without current pathological fracture: Secondary | ICD-10-CM | POA: Insufficient documentation

## 2019-01-28 MED ORDER — DENOSUMAB 60 MG/ML ~~LOC~~ SOSY
60.0000 mg | PREFILLED_SYRINGE | Freq: Once | SUBCUTANEOUS | Status: AC
  Administered 2019-01-28: 10:00:00 60 mg via SUBCUTANEOUS
  Filled 2019-01-28: qty 1

## 2019-01-28 NOTE — Discharge Instructions (Signed)
Denosumab injection °What is this medicine? °DENOSUMAB (den oh sue mab) slows bone breakdown. Prolia is used to treat osteoporosis in women after menopause and in men, and in people who are taking corticosteroids for 6 months or more. Xgeva is used to treat a high calcium level due to cancer and to prevent bone fractures and other bone problems caused by multiple myeloma or cancer bone metastases. Xgeva is also used to treat giant cell tumor of the bone. °This medicine may be used for other purposes; ask your health care provider or pharmacist if you have questions. °COMMON BRAND NAME(S): Prolia, XGEVA °What should I tell my health care provider before I take this medicine? °They need to know if you have any of these conditions: °· dental disease °· having surgery or tooth extraction °· infection °· kidney disease °· low levels of calcium or Vitamin D in the blood °· malnutrition °· on hemodialysis °· skin conditions or sensitivity °· thyroid or parathyroid disease °· an unusual reaction to denosumab, other medicines, foods, dyes, or preservatives °· pregnant or trying to get pregnant °· breast-feeding °How should I use this medicine? °This medicine is for injection under the skin. It is given by a health care professional in a hospital or clinic setting. °A special MedGuide will be given to you before each treatment. Be sure to read this information carefully each time. °For Prolia, talk to your pediatrician regarding the use of this medicine in children. Special care may be needed. For Xgeva, talk to your pediatrician regarding the use of this medicine in children. While this drug may be prescribed for children as young as 13 years for selected conditions, precautions do apply. °Overdosage: If you think you have taken too much of this medicine contact a poison control center or emergency room at once. °NOTE: This medicine is only for you. Do not share this medicine with others. °What if I miss a dose? °It is  important not to miss your dose. Call your doctor or health care professional if you are unable to keep an appointment. °What may interact with this medicine? °Do not take this medicine with any of the following medications: °· other medicines containing denosumab °This medicine may also interact with the following medications: °· medicines that lower your chance of fighting infection °· steroid medicines like prednisone or cortisone °This list may not describe all possible interactions. Give your health care provider a list of all the medicines, herbs, non-prescription drugs, or dietary supplements you use. Also tell them if you smoke, drink alcohol, or use illegal drugs. Some items may interact with your medicine. °What should I watch for while using this medicine? °Visit your doctor or health care professional for regular checks on your progress. Your doctor or health care professional may order blood tests and other tests to see how you are doing. °Call your doctor or health care professional for advice if you get a fever, chills or sore throat, or other symptoms of a cold or flu. Do not treat yourself. This drug may decrease your body's ability to fight infection. Try to avoid being around people who are sick. °You should make sure you get enough calcium and vitamin D while you are taking this medicine, unless your doctor tells you not to. Discuss the foods you eat and the vitamins you take with your health care professional. °See your dentist regularly. Brush and floss your teeth as directed. Before you have any dental work done, tell your dentist you are   receiving this medicine. Do not become pregnant while taking this medicine or for 5 months after stopping it. Talk with your doctor or health care professional about your birth control options while taking this medicine. Women should inform their doctor if they wish to become pregnant or think they might be pregnant. There is a potential for serious side  effects to an unborn child. Talk to your health care professional or pharmacist for more information. What side effects may I notice from receiving this medicine? Side effects that you should report to your doctor or health care professional as soon as possible:  allergic reactions like skin rash, itching or hives, swelling of the face, lips, or tongue  bone pain  breathing problems  dizziness  jaw pain, especially after dental work  redness, blistering, peeling of the skin  signs and symptoms of infection like fever or chills; cough; sore throat; pain or trouble passing urine  signs of low calcium like fast heartbeat, muscle cramps or muscle pain; pain, tingling, numbness in the hands or feet; seizures  unusual bleeding or bruising  unusually weak or tired Side effects that usually do not require medical attention (report to your doctor or health care professional if they continue or are bothersome):  constipation  diarrhea  headache  joint pain  loss of appetite  muscle pain  runny nose  tiredness  upset stomach This list may not describe all possible side effects. Call your doctor for medical advice about side effects. You may report side effects to FDA at 1-800-FDA-1088. Where should I keep my medicine? This medicine is only given in a clinic, doctor's office, or other health care setting and will not be stored at home. NOTE: This sheet is a summary. It may not cover all possible information. If you have questions about this medicine, talk to your doctor, pharmacist, or health care provider.  2020 [employer]vier/Gold Standard (2017-06-21 16:10:44)

## 2019-02-02 ENCOUNTER — Emergency Department (HOSPITAL_COMMUNITY): Payer: Medicare Other

## 2019-02-02 ENCOUNTER — Other Ambulatory Visit: Payer: Self-pay

## 2019-02-02 ENCOUNTER — Encounter (HOSPITAL_COMMUNITY): Payer: Self-pay | Admitting: Emergency Medicine

## 2019-02-02 ENCOUNTER — Inpatient Hospital Stay (HOSPITAL_COMMUNITY)
Admission: EM | Admit: 2019-02-02 | Discharge: 2019-02-07 | DRG: 292 | Disposition: A | Discharge status: Skilled Nursing Facility | Payer: Medicare Other | Source: Skilled Nursing Facility | Attending: Internal Medicine | Admitting: Internal Medicine

## 2019-02-02 DIAGNOSIS — I4891 Unspecified atrial fibrillation: Secondary | ICD-10-CM | POA: Diagnosis not present

## 2019-02-02 DIAGNOSIS — Z96641 Presence of right artificial hip joint: Secondary | ICD-10-CM | POA: Diagnosis present

## 2019-02-02 DIAGNOSIS — M810 Age-related osteoporosis without current pathological fracture: Secondary | ICD-10-CM | POA: Diagnosis present

## 2019-02-02 DIAGNOSIS — F329 Major depressive disorder, single episode, unspecified: Secondary | ICD-10-CM | POA: Diagnosis present

## 2019-02-02 DIAGNOSIS — I509 Heart failure, unspecified: Secondary | ICD-10-CM

## 2019-02-02 DIAGNOSIS — Z8673 Personal history of transient ischemic attack (TIA), and cerebral infarction without residual deficits: Secondary | ICD-10-CM

## 2019-02-02 DIAGNOSIS — I43 Cardiomyopathy in diseases classified elsewhere: Secondary | ICD-10-CM | POA: Diagnosis present

## 2019-02-02 DIAGNOSIS — E785 Hyperlipidemia, unspecified: Secondary | ICD-10-CM | POA: Diagnosis present

## 2019-02-02 DIAGNOSIS — I480 Paroxysmal atrial fibrillation: Secondary | ICD-10-CM | POA: Diagnosis present

## 2019-02-02 DIAGNOSIS — I110 Hypertensive heart disease with heart failure: Principal | ICD-10-CM | POA: Diagnosis present

## 2019-02-02 DIAGNOSIS — I5033 Acute on chronic diastolic (congestive) heart failure: Secondary | ICD-10-CM | POA: Diagnosis present

## 2019-02-02 DIAGNOSIS — R296 Repeated falls: Secondary | ICD-10-CM | POA: Diagnosis present

## 2019-02-02 DIAGNOSIS — G20 Parkinson's disease: Secondary | ICD-10-CM | POA: Diagnosis present

## 2019-02-02 DIAGNOSIS — I10 Essential (primary) hypertension: Secondary | ICD-10-CM | POA: Diagnosis present

## 2019-02-02 DIAGNOSIS — F259 Schizoaffective disorder, unspecified: Secondary | ICD-10-CM | POA: Diagnosis present

## 2019-02-02 DIAGNOSIS — Z8249 Family history of ischemic heart disease and other diseases of the circulatory system: Secondary | ICD-10-CM

## 2019-02-02 DIAGNOSIS — Z888 Allergy status to other drugs, medicaments and biological substances status: Secondary | ICD-10-CM

## 2019-02-02 DIAGNOSIS — Z90710 Acquired absence of both cervix and uterus: Secondary | ICD-10-CM

## 2019-02-02 DIAGNOSIS — F0280 Dementia in other diseases classified elsewhere without behavioral disturbance: Secondary | ICD-10-CM | POA: Diagnosis present

## 2019-02-02 DIAGNOSIS — I340 Nonrheumatic mitral (valve) insufficiency: Secondary | ICD-10-CM | POA: Diagnosis present

## 2019-02-02 DIAGNOSIS — Z7901 Long term (current) use of anticoagulants: Secondary | ICD-10-CM

## 2019-02-02 DIAGNOSIS — Z79899 Other long term (current) drug therapy: Secondary | ICD-10-CM

## 2019-02-02 DIAGNOSIS — J449 Chronic obstructive pulmonary disease, unspecified: Secondary | ICD-10-CM | POA: Diagnosis present

## 2019-02-02 DIAGNOSIS — Z66 Do not resuscitate: Secondary | ICD-10-CM | POA: Diagnosis present

## 2019-02-02 DIAGNOSIS — I5032 Chronic diastolic (congestive) heart failure: Secondary | ICD-10-CM

## 2019-02-02 DIAGNOSIS — R29898 Other symptoms and signs involving the musculoskeletal system: Secondary | ICD-10-CM

## 2019-02-02 DIAGNOSIS — Z823 Family history of stroke: Secondary | ICD-10-CM

## 2019-02-02 DIAGNOSIS — F0390 Unspecified dementia without behavioral disturbance: Secondary | ICD-10-CM | POA: Diagnosis present

## 2019-02-02 DIAGNOSIS — E854 Organ-limited amyloidosis: Secondary | ICD-10-CM | POA: Diagnosis present

## 2019-02-02 DIAGNOSIS — Z20828 Contact with and (suspected) exposure to other viral communicable diseases: Secondary | ICD-10-CM | POA: Diagnosis present

## 2019-02-02 LAB — CBC WITH DIFFERENTIAL/PLATELET
Abs Immature Granulocytes: 0.03 K/uL (ref 0.00–0.07)
Basophils Absolute: 0.1 K/uL (ref 0.0–0.1)
Basophils Relative: 1 %
Eosinophils Absolute: 0.1 K/uL (ref 0.0–0.5)
Eosinophils Relative: 1 %
HCT: 40.3 % (ref 36.0–46.0)
Hemoglobin: 12.9 g/dL (ref 12.0–15.0)
Immature Granulocytes: 0 %
Lymphocytes Relative: 26 %
Lymphs Abs: 2 K/uL (ref 0.7–4.0)
MCH: 33.2 pg (ref 26.0–34.0)
MCHC: 32 g/dL (ref 30.0–36.0)
MCV: 103.6 fL — ABNORMAL HIGH (ref 80.0–100.0)
Monocytes Absolute: 0.7 K/uL (ref 0.1–1.0)
Monocytes Relative: 10 %
Neutro Abs: 4.7 K/uL (ref 1.7–7.7)
Neutrophils Relative %: 62 %
Platelets: 234 K/uL (ref 150–400)
RBC: 3.89 MIL/uL (ref 3.87–5.11)
RDW: 14 % (ref 11.5–15.5)
WBC: 7.6 K/uL (ref 4.0–10.5)
nRBC: 0 % (ref 0.0–0.2)

## 2019-02-02 LAB — COMPREHENSIVE METABOLIC PANEL
ALT: 15 U/L (ref 0–44)
AST: 31 U/L (ref 15–41)
Albumin: 3.4 g/dL — ABNORMAL LOW (ref 3.5–5.0)
Alkaline Phosphatase: 87 U/L (ref 38–126)
Anion gap: 10 (ref 5–15)
BUN: 19 mg/dL (ref 8–23)
CO2: 26 mmol/L (ref 22–32)
Calcium: 9 mg/dL (ref 8.9–10.3)
Chloride: 100 mmol/L (ref 98–111)
Creatinine, Ser: 0.84 mg/dL (ref 0.44–1.00)
GFR calc Af Amer: >60 mL/min (ref >60)
GFR calc non Af Amer: 60 mL/min — ABNORMAL LOW (ref >60)
Glucose, Bld: 113 mg/dL — ABNORMAL HIGH (ref 70–99)
Potassium: 4.1 mmol/L (ref 3.5–5.1)
Sodium: 136 mmol/L (ref 135–145)
Total Bilirubin: 0.4 mg/dL (ref 0.3–1.2)
Total Protein: 7.6 g/dL (ref 6.5–8.1)

## 2019-02-02 LAB — TROPONIN I (HIGH SENSITIVITY)
Troponin I (High Sensitivity): 9 ng/L
Troponin I (High Sensitivity): 12 ng/L

## 2019-02-02 LAB — LACTIC ACID, PLASMA: Lactic Acid, Venous: 1.3 mmol/L (ref 0.5–1.9)

## 2019-02-02 LAB — BRAIN NATRIURETIC PEPTIDE: B Natriuretic Peptide: 335 pg/mL — ABNORMAL HIGH (ref 0.0–100.0)

## 2019-02-02 LAB — POC SARS CORONAVIRUS 2 AG -  ED: SARS Coronavirus 2 Ag: NEGATIVE

## 2019-02-02 MED ORDER — HYDROMORPHONE HCL 1 MG/ML IJ SOLN: 0.5000 mg | Freq: Once | INTRAMUSCULAR | Status: DC

## 2019-02-02 MED ORDER — METOPROLOL TARTRATE 5 MG/5ML IV SOLN
2.5000 mg | Freq: Once | INTRAVENOUS | Status: AC
  Administered 2019-02-02: 23:00:00 2.5 mg via INTRAVENOUS
  Filled 2019-02-02: qty 5

## 2019-02-02 MED ORDER — HYDROMORPHONE HCL 1 MG/ML IJ SOLN
INTRAMUSCULAR | Status: AC
  Filled 2019-02-02: qty 1

## 2019-02-02 MED ORDER — METOPROLOL TARTRATE 5 MG/5ML IV SOLN
2.5000 mg | Freq: Once | INTRAVENOUS | Status: AC
  Administered 2019-02-02: 22:00:00 2.5 mg via INTRAVENOUS
  Filled 2019-02-02: qty 5

## 2019-02-02 MED ORDER — FUROSEMIDE 10 MG/ML IJ SOLN
20.0000 mg | Freq: Once | INTRAMUSCULAR | Status: AC
  Administered 2019-02-03: 20 mg via INTRAVENOUS
  Filled 2019-02-02: qty 2

## 2019-02-02 NOTE — ED Provider Notes (Signed)
[facility]ERGENCY DEPARTMENT Provider Note   CSN: [id number] Arrival date & time: 02/02/19  2017     History   Chief Complaint Chief Complaint  Patient presents with  . Shortness of Breath  . Atrial Fibrillation    HPI Yolanda Baker is a 85 y.o. female.     The history is provided by the patient and medical records. No language interpreter was used.  Shortness of Breath Atrial Fibrillation Associated symptoms include shortness of breath.   Yolanda Baker is a 85 y.o. female who presents to the Emergency Department complaining of sob. She presents the emergency department by EMS for evaluation of sudden onset shortness of breath that began today. She is a resident at [facility] She was in her routine state of health yesterday. Today she endorses subjective fevers as well as increase shortness of breath. She denies any chest pain, productive cough, abdominal pain. She does have mild associated nausea. No known COVID 19 exposures but there has been coronavirus at her facility.  Sxs are moderate and constant in nature.   Past Medical History:  Diagnosis Date  . Anxiety   . Arthritis    neck  . Bruises easily   . Depression   . Dysrhythmia    hx A-FLUTTER after hip surg April 2015- take metoprolol  . Eczema   . Hip joint pain    RT  . Hypertension   . Memory loss   . Osteoporosis   . Pneumonia    august 2017  . Shortness of breath    with activity  . Stroke [facility])    "mini stroke" per MRI - no deficiets     Patient Active Problem List   Diagnosis Date Noted  . GAD (generalized anxiety disorder) 01/15/2018  . Schizoaffective disorder ([place]) 01/15/2018  . Hyperlipidemia LDL goal <70 07/01/2014  . Constipation chronic 07/01/2014  . Acute CVA (cerebrovascular accident) ([place])   . Acute blood loss anemia 10/12/2013  . Postoperative anemia due to acute blood loss 10/04/2013  . Postop Transfusion 10/04/2013  . Closed fracture of femur with nonunion  10/03/2013  . Femur fracture, right ([place]) 10/03/2013  . Closed femur fracture ([place]) 10/03/2013  . UTI (urinary tract infection) 06/23/2013  . Dementia ([place]) 06/09/2013  . Anxiety 06/09/2013  . Atrial flutter ([place]) 06/07/2013  . Hip fracture ([place]) 06/04/2013  . Depression 06/04/2013  . Anxiety state 06/04/2013  . HTN (hypertension) 06/04/2013  . Closed right hip fracture ([place]) 06/04/2013    Past Surgical History:  Procedure Laterality Date  . ABDOMINAL HYSTERECTOMY    . FRACTURE SURGERY Right 06/05/2013   hip  . HIP ARTHROPLASTY Right 10/03/2013   Procedure: CONVERSION OF PREVIOUS RIGHT HIP SURGERY TO A RIGHT TOTAL HIP ARTHROPLASTY;  Surgeon: Gearlean Alf, MD;  Location: [facility] ORS;  Service: Orthopedics;  Laterality: Right;  . INTRAMEDULLARY (IM) NAIL INTERTROCHANTERIC Right 06/05/2013   Procedure: INTRAMEDULLARY (IM) NAIL INTERTROCHANTRIC;  Surgeon: Gearlean Alf, MD;  Location: [facility];  Service: Orthopedics;  Laterality: Right;  . JOINT REPLACEMENT     left hip  . left hip Left 2008  . LEG SURGERY Right    x2     OB History   No obstetric history on file.      Home Medications    Prior to Admission medications   Medication Sig Start Date End Date Taking? Authorizing Provider  acetaminophen (TYLENOL) 500 MG tablet Take 1 tablet (500 mg total) by mouth  2 (two) times daily as needed for mild pain or moderate pain. Patient taking differently: Take 1,000 mg by mouth every 6 (six) hours.  03/02/15  Yes Leta Baptist, MD  albuterol (VENTOLIN HFA) 108 (90 Base) MCG/ACT inhaler Inhale 2 puffs into the lungs every 6 (six) hours as needed for wheezing or shortness of breath.   Yes [provider]  amLODipine (NORVASC) 5 MG tablet Take 5 mg by mouth daily.   Yes [provider]  apixaban (ELIQUIS) 2.5 MG TABS tablet Take 1 tablet (2.5 mg total) by mouth 2 (two) times daily. 07/01/14  Yes Layne Benton, NP  chlorhexidine (PERIDEX) 0.12 % solution Use as directed 15  mLs in the mouth or throat 2 (two) times daily. Swish and spit   Yes [provider]  Chlorpheniramine-APAP (CORICIDIN) 2-325 MG TABS Take 1 tablet by mouth 2 (two) times daily as needed (congestion).   Yes [provider]  citalopram (CELEXA) 10 MG tablet Take 30 mg by mouth daily.  01/03/15  Yes [provider]  docusate sodium (COLACE) 100 MG capsule Take 100 mg by mouth at bedtime.   Yes [provider]  galantamine (RAZADYNE ER) 8 MG 24 hr capsule Take 8 mg by mouth daily with breakfast.   Yes [provider]  LORazepam (ATIVAN) 0.5 MG tablet Take one tablet by mouth every night at bedtime for rest Patient taking differently: Take 0.5 mg by mouth See admin instructions. Take one tablet (0.5 mg) by mouth twice daily - 2:30pm and 8pm, may also take one tablet (0.5 mg) by mouth prior to 2am as needed for anxiety 07/01/14  Yes Layne Benton, NP  metoprolol tartrate (LOPRESSOR) 25 MG tablet Take 25-50 mg by mouth See admin instructions. Take two tablets (50 mg) by mouth every morning and one tablet (25 mg) at night   Yes [provider]  Multiple Vitamins-Minerals (CERTAVITE SENIOR/ANTIOXIDANT PO) Take 1 tablet by mouth daily.    Yes [provider]  Polyethyl Glycol-Propyl Glycol (SYSTANE) 0.4-0.3 % SOLN Place 1 drop into both eyes every hour as needed (dry eyes/irritation).   Yes [provider]  polyethylene glycol (MIRALAX / GLYCOLAX) packet Take 17 g by mouth daily as needed (constipation). Mix in 4-8 oz liquid and drink   Yes [provider]  potassium chloride SA (K-DUR,KLOR-CON) 20 MEQ tablet Take 20 mEq by mouth every other day.   Yes [provider]  Psyllium (METAMUCIL PO) Take 5 mLs by mouth 2 (two) times daily. Mix in water and drink   Yes [provider]  simvastatin (ZOCOR) 10 MG tablet Take 1 tablet (10 mg total) by mouth daily at 6 PM. 07/01/14  Yes Biby, Jani Files, NP  Skin Protectants, Misc.  (CALAZIME SKIN PROTECTANT EX) Apply 1 application topically See admin instructions. Apply topically to affected area of coccyx and buttocks as needed for rash/irritation   Yes [provider]  umeclidinium-vilanterol (ANORO ELLIPTA) 62.5-25 MCG/INH AEPB Inhale 1 puff into the lungs daily.   Yes [provider]  ziprasidone (GEODON) 60 MG capsule Take 60 mg by mouth at bedtime.  06/08/15  Yes [provider]  Acetaminophen-Codeine (TYLENOL/CODEINE #3) 300-30 MG tablet Take 1 tablet by mouth every 6 (six) hours. While awake.  Do not wake patient to give medication. Patient not taking: Reported on 02/02/2019 03/02/15   Leta Baptist, MD  amLODipine (NORVASC) 5 MG tablet TAKE 1 TABLET BY MOUTH EVERY MORNING *  01/03/16* Patient not taking: Reported on 02/02/2019 01/03/16   Kimber RelicGreen, Arthur G, MD    Family History Family History  Problem Relation Age of Onset  . Heart attack Father   . Hypertension Father   . Hypertension Sister   . Stroke Sister   . Hypertension Brother     Social History Social History   Tobacco Use  . Smoking status: Never Smoker  . Smokeless tobacco: Never Used  Substance Use Topics  . Alcohol use: No    Alcohol/week: 0.0 standard drinks  . Drug use: No     Allergies   Flagyl [metronidazole]   Review of Systems Review of Systems  Respiratory: Positive for shortness of breath.   All other systems reviewed and are negative.    Physical Exam Updated Vital Signs BP (!) 152/98   Pulse 81   Temp 98.8 F (37.1 C) (Rectal)   Resp 16   SpO2 94%   Physical Exam Vitals signs and nursing note reviewed.  Constitutional:      Appearance: She is well-developed.     Comments: Thin, elderly appearing  HENT:     Head: Normocephalic and atraumatic.  Cardiovascular:     Rate and Rhythm: Tachycardia present. Rhythm irregular.     Heart sounds: No murmur.  Pulmonary:     Effort: Pulmonary effort is normal. No respiratory distress.      Comments: Decreased sounds bilaterally Abdominal:     Palpations: Abdomen is soft.     Tenderness: There is no abdominal tenderness. There is no guarding or rebound.  Musculoskeletal:        General: No tenderness.     Comments: One plus pitting edema to bilateral lower extremities  Skin:    General: Skin is warm and dry.  Neurological:     Mental Status: She is alert and oriented to person, place, and time.  Psychiatric:        Behavior: Behavior normal.      ED Treatments / Results  Labs (all labs ordered are listed, but only abnormal results are displayed) Labs Reviewed  COMPREHENSIVE METABOLIC PANEL - Abnormal; Notable for the following components:      Result Value   Glucose, Bld 113 (*)    Albumin 3.4 (*)    GFR calc non Af Amer 60 (*)    All other components within normal limits  CBC WITH DIFFERENTIAL/PLATELET - Abnormal; Notable for the following components:   MCV 103.6 (*)    All other components within normal limits  BRAIN NATRIURETIC PEPTIDE - Abnormal; Notable for the following components:   B Natriuretic Peptide 335.0 (*)    All other components within normal limits  SARS CORONAVIRUS 2 (TAT 6-24 HRS)  LACTIC ACID, PLASMA  LACTIC ACID, PLASMA  URINALYSIS, ROUTINE W REFLEX MICROSCOPIC  POC SARS CORONAVIRUS 2 AG -  ED  TROPONIN I (HIGH SENSITIVITY)  TROPONIN I (HIGH SENSITIVITY)    EKG EKG Interpretation  Date/Time:  Monday February 02 2019 20:51:31 EST Ventricular Rate:  122 PR Interval:    QRS Duration: 111 QT Interval:  336 QTC Calculation: 479 R Axis:   80 Text Interpretation: Atrial fibrillation Ventricular premature complex Borderline low voltage, extremity leads Anteroseptal infarct, old Artifact in lead(s) I II aVR aVL aVF V1 Confirmed by Tilden Fossaees, Jniya Madara [phone]([id number]) on 02/02/2019 8:56:37 PM   Radiology Dg Chest Port 1 View  Result Date: 02/02/2019 CLINICAL DATA:  Shortness of breath and AFib for 1 day, history of pneumonia and hypertension,  nonsmoker EXAM: PORTABLE CHEST 1 VIEW COMPARISON:  Radiograph 06/04/2013 FINDINGS: Hazy interstitial opacity. There is a small right pleural effusion. No pneumothorax or left effusion is seen. No consolidative opacity is present. Cardiomegaly, slightly more pronounced than on comparison portable radiographs. Atherosclerotic plaque in the aorta. Degenerative changes are present in the imaged spine and shoulders. No acute osseous or soft tissue abnormality. IMPRESSION: 1. Hazy interstitial opacity with a small right pleural effusion and increasing cardiomegaly. Findings could reflect CHF with mild edema. 2.  Aortic Atherosclerosis (ICD10-I70.0). Electronically Signed   By: Kreg Shropshire M.D.   On: 02/02/2019 20:57    Procedures Procedures (including critical care time)  Medications Ordered in ED Medications  metoprolol tartrate (LOPRESSOR) injection 2.5 mg (2.5 mg Intravenous Given 02/02/19 2156)  metoprolol tartrate (LOPRESSOR) injection 2.5 mg (2.5 mg Intravenous Given 02/02/19 2257)  furosemide (LASIX) injection 20 mg (20 mg Intravenous Given 02/03/19 0006)     Initial Impression / Assessment and Plan / ED Course  I have reviewed the triage vital signs and the nursing notes.  Pertinent labs & imaging results that were available during my care of the patient were reviewed by me and considered in my medical decision making (see chart for details).        Patient with history of atrial flutter here for evaluation of increased shortness of breath. Patient in no acute distress on ED eval. EKG with atrial fibrillation. Heart rates ranging from 100 to 120s in the emergency department. Chest x-ray with possible pleural effusion. Presentation is not consistent with pneumonia. Her heart rate did improve after metoprolol administration. BNP is mildly elevated. She was treated with Lasix for diuresis. Hospitalist consulted for admission. Patient and daughter updated findings of studies recommendation for  admission and they are in agreement with treatment plan.  Final Clinical Impressions(s) / ED Diagnoses   Final diagnoses:  Atrial fibrillation with rapid ventricular response (HCC)  Acute congestive heart failure, unspecified heart failure type [facility])    ED Discharge Orders    None       Tilden Fossa, MD 02/03/19 [phone]

## 2019-02-02 NOTE — ED Triage Notes (Signed)
Pt BIB [facility] from [facility], c/o shortness of breath that started today. Denies chest pain, cough. Endorses nausea, no vomiting. Noted by EMS to be in afib, rate 70-140.

## 2019-02-02 NOTE — ED Notes (Signed)
Yolanda Baker [phone] daughter is looking for an update on patient

## 2019-02-03 ENCOUNTER — Inpatient Hospital Stay (HOSPITAL_COMMUNITY): Payer: Medicare Other

## 2019-02-03 ENCOUNTER — Encounter (HOSPITAL_COMMUNITY): Payer: Self-pay | Admitting: Internal Medicine

## 2019-02-03 DIAGNOSIS — I5041 Acute combined systolic (congestive) and diastolic (congestive) heart failure: Secondary | ICD-10-CM

## 2019-02-03 DIAGNOSIS — Z888 Allergy status to other drugs, medicaments and biological substances status: Secondary | ICD-10-CM | POA: Diagnosis not present

## 2019-02-03 DIAGNOSIS — Z79899 Other long term (current) drug therapy: Secondary | ICD-10-CM | POA: Diagnosis not present

## 2019-02-03 DIAGNOSIS — I5033 Acute on chronic diastolic (congestive) heart failure: Secondary | ICD-10-CM | POA: Diagnosis present

## 2019-02-03 DIAGNOSIS — F0281 Dementia in other diseases classified elsewhere with behavioral disturbance: Secondary | ICD-10-CM

## 2019-02-03 DIAGNOSIS — I43 Cardiomyopathy in diseases classified elsewhere: Secondary | ICD-10-CM | POA: Diagnosis present

## 2019-02-03 DIAGNOSIS — I509 Heart failure, unspecified: Secondary | ICD-10-CM | POA: Diagnosis not present

## 2019-02-03 DIAGNOSIS — I4891 Unspecified atrial fibrillation: Secondary | ICD-10-CM

## 2019-02-03 DIAGNOSIS — Z20828 Contact with and (suspected) exposure to other viral communicable diseases: Secondary | ICD-10-CM | POA: Diagnosis present

## 2019-02-03 DIAGNOSIS — E854 Organ-limited amyloidosis: Secondary | ICD-10-CM | POA: Diagnosis present

## 2019-02-03 DIAGNOSIS — I110 Hypertensive heart disease with heart failure: Secondary | ICD-10-CM | POA: Diagnosis present

## 2019-02-03 DIAGNOSIS — G309 Alzheimer's disease, unspecified: Secondary | ICD-10-CM | POA: Diagnosis not present

## 2019-02-03 DIAGNOSIS — F0150 Vascular dementia without behavioral disturbance: Secondary | ICD-10-CM | POA: Diagnosis not present

## 2019-02-03 DIAGNOSIS — M810 Age-related osteoporosis without current pathological fracture: Secondary | ICD-10-CM | POA: Diagnosis present

## 2019-02-03 DIAGNOSIS — E785 Hyperlipidemia, unspecified: Secondary | ICD-10-CM | POA: Diagnosis present

## 2019-02-03 DIAGNOSIS — F259 Schizoaffective disorder, unspecified: Secondary | ICD-10-CM

## 2019-02-03 DIAGNOSIS — I10 Essential (primary) hypertension: Secondary | ICD-10-CM

## 2019-02-03 DIAGNOSIS — Z90710 Acquired absence of both cervix and uterus: Secondary | ICD-10-CM | POA: Diagnosis not present

## 2019-02-03 DIAGNOSIS — R296 Repeated falls: Secondary | ICD-10-CM | POA: Diagnosis present

## 2019-02-03 DIAGNOSIS — I340 Nonrheumatic mitral (valve) insufficiency: Secondary | ICD-10-CM

## 2019-02-03 DIAGNOSIS — Z66 Do not resuscitate: Secondary | ICD-10-CM | POA: Diagnosis present

## 2019-02-03 DIAGNOSIS — J449 Chronic obstructive pulmonary disease, unspecified: Secondary | ICD-10-CM | POA: Diagnosis present

## 2019-02-03 DIAGNOSIS — G20 Parkinson's disease: Secondary | ICD-10-CM | POA: Diagnosis present

## 2019-02-03 DIAGNOSIS — Z8673 Personal history of transient ischemic attack (TIA), and cerebral infarction without residual deficits: Secondary | ICD-10-CM | POA: Diagnosis not present

## 2019-02-03 DIAGNOSIS — I480 Paroxysmal atrial fibrillation: Secondary | ICD-10-CM | POA: Diagnosis present

## 2019-02-03 DIAGNOSIS — Z823 Family history of stroke: Secondary | ICD-10-CM | POA: Diagnosis not present

## 2019-02-03 DIAGNOSIS — Z8249 Family history of ischemic heart disease and other diseases of the circulatory system: Secondary | ICD-10-CM | POA: Diagnosis not present

## 2019-02-03 DIAGNOSIS — F0280 Dementia in other diseases classified elsewhere without behavioral disturbance: Secondary | ICD-10-CM | POA: Diagnosis present

## 2019-02-03 DIAGNOSIS — Z96641 Presence of right artificial hip joint: Secondary | ICD-10-CM | POA: Diagnosis present

## 2019-02-03 DIAGNOSIS — Z7901 Long term (current) use of anticoagulants: Secondary | ICD-10-CM | POA: Diagnosis not present

## 2019-02-03 DIAGNOSIS — F329 Major depressive disorder, single episode, unspecified: Secondary | ICD-10-CM | POA: Diagnosis present

## 2019-02-03 LAB — COMPREHENSIVE METABOLIC PANEL
ALT: 11 U/L (ref 0–44)
AST: 35 U/L (ref 15–41)
Albumin: 3.4 g/dL — ABNORMAL LOW (ref 3.5–5.0)
Alkaline Phosphatase: 73 U/L (ref 38–126)
Anion gap: 12 (ref 5–15)
BUN: 13 mg/dL (ref 8–23)
CO2: 27 mmol/L (ref 22–32)
Calcium: 8.6 mg/dL — ABNORMAL LOW (ref 8.9–10.3)
Chloride: 98 mmol/L (ref 98–111)
Creatinine, Ser: 0.76 mg/dL (ref 0.44–1.00)
GFR calc Af Amer: >60 mL/min (ref >60)
GFR calc non Af Amer: >60 mL/min (ref >60)
Glucose, Bld: 101 mg/dL — ABNORMAL HIGH (ref 70–99)
Potassium: 3.6 mmol/L (ref 3.5–5.1)
Sodium: 137 mmol/L (ref 135–145)
Total Bilirubin: 1.1 mg/dL (ref 0.3–1.2)
Total Protein: 7.3 g/dL (ref 6.5–8.1)

## 2019-02-03 LAB — CBC WITH DIFFERENTIAL/PLATELET
Abs Immature Granulocytes: 0.03 10*3/uL (ref 0.00–0.07)
Basophils Absolute: 0.1 10*3/uL (ref 0.0–0.1)
Basophils Relative: 1 %
Eosinophils Absolute: 0.1 10*3/uL (ref 0.0–0.5)
Eosinophils Relative: 1 %
HCT: 41.7 % (ref 36.0–46.0)
Hemoglobin: 13.4 g/dL (ref 12.0–15.0)
Immature Granulocytes: 0 %
Lymphocytes Relative: 33 %
Lymphs Abs: 2.3 10*3/uL (ref 0.7–4.0)
MCH: 33.2 pg (ref 26.0–34.0)
MCHC: 32.1 g/dL (ref 30.0–36.0)
MCV: 103.2 fL — ABNORMAL HIGH (ref 80.0–100.0)
Monocytes Absolute: 0.8 10*3/uL (ref 0.1–1.0)
Monocytes Relative: 11 %
Neutro Abs: 3.7 10*3/uL (ref 1.7–7.7)
Neutrophils Relative %: 54 %
Platelets: 225 10*3/uL (ref 150–400)
RBC: 4.04 MIL/uL (ref 3.87–5.11)
RDW: 13.8 % (ref 11.5–15.5)
WBC: 6.9 10*3/uL (ref 4.0–10.5)
nRBC: 0 % (ref 0.0–0.2)

## 2019-02-03 LAB — MAGNESIUM: Magnesium: 1.8 mg/dL (ref 1.7–2.4)

## 2019-02-03 LAB — URINALYSIS, ROUTINE W REFLEX MICROSCOPIC
Bilirubin Urine: NEGATIVE
Glucose, UA: NEGATIVE mg/dL
Hgb urine dipstick: NEGATIVE
Ketones, ur: NEGATIVE mg/dL
Leukocytes,Ua: NEGATIVE
Nitrite: NEGATIVE
Protein, ur: 100 mg/dL — AB
Specific Gravity, Urine: 1.014 (ref 1.005–1.030)
pH: 7 (ref 5.0–8.0)

## 2019-02-03 LAB — TSH: TSH: 4.514 u[IU]/mL — ABNORMAL HIGH (ref 0.350–4.500)

## 2019-02-03 LAB — TROPONIN I (HIGH SENSITIVITY): Troponin I (High Sensitivity): 14 ng/L (ref <18)

## 2019-02-03 LAB — SARS CORONAVIRUS 2 (TAT 6-24 HRS): SARS Coronavirus 2: NEGATIVE

## 2019-02-03 LAB — D-DIMER, QUANTITATIVE: D-Dimer, Quant: 1.5 ug/mL-FEU — ABNORMAL HIGH (ref 0.00–0.50)

## 2019-02-03 LAB — ECHOCARDIOGRAM COMPLETE

## 2019-02-03 MED ORDER — ACETAMINOPHEN 325 MG PO TABS: 650.0000 mg | ORAL_TABLET | Freq: Four times a day (QID) | ORAL | Status: DC | PRN

## 2019-02-03 MED ORDER — POLYETHYLENE GLYCOL 3350 17 G PO PACK: 17.0000 g | PACK | Freq: Every day | ORAL | Status: DC | PRN

## 2019-02-03 MED ORDER — ACETAMINOPHEN 650 MG RE SUPP: 650.0000 mg | Freq: Four times a day (QID) | RECTAL | Status: DC | PRN

## 2019-02-03 MED ORDER — LOSARTAN POTASSIUM 25 MG PO TABS
25.0000 mg | ORAL_TABLET | Freq: Every day | ORAL | Status: DC
  Administered 2019-02-04: 08:00:00 25 mg via ORAL
  Filled 2019-02-03: qty 1

## 2019-02-03 MED ORDER — METOPROLOL TARTRATE 5 MG/5ML IV SOLN
5.0000 mg | Freq: Once | INTRAVENOUS | Status: AC
  Administered 2019-02-03: 13:00:00 5 mg via INTRAVENOUS
  Filled 2019-02-03: qty 5

## 2019-02-03 MED ORDER — ALBUTEROL SULFATE (2.5 MG/3ML) 0.083% IN NEBU: 2.5000 mg | INHALATION_SOLUTION | Freq: Four times a day (QID) | RESPIRATORY_TRACT | Status: DC | PRN

## 2019-02-03 MED ORDER — ZIPRASIDONE HCL 40 MG PO CAPS
60.0000 mg | ORAL_CAPSULE | Freq: Every day | ORAL | Status: DC
  Administered 2019-02-03 – 2019-02-06 (×4): 60 mg via ORAL
  Filled 2019-02-03 (×5): qty 1

## 2019-02-03 MED ORDER — ONDANSETRON HCL 4 MG/2ML IJ SOLN: 4.0000 mg | Freq: Four times a day (QID) | INTRAMUSCULAR | Status: DC | PRN

## 2019-02-03 MED ORDER — METOPROLOL TARTRATE 25 MG PO TABS
25.0000 mg | ORAL_TABLET | Freq: Every day | ORAL | Status: DC
  Administered 2019-02-03: 25 mg via ORAL
  Filled 2019-02-03: qty 1

## 2019-02-03 MED ORDER — SIMVASTATIN 20 MG PO TABS
10.0000 mg | ORAL_TABLET | Freq: Every day | ORAL | Status: DC
  Administered 2019-02-03 – 2019-02-06 (×4): 10 mg via ORAL
  Filled 2019-02-03 (×4): qty 1

## 2019-02-03 MED ORDER — AMLODIPINE BESYLATE 5 MG PO TABS
5.0000 mg | ORAL_TABLET | Freq: Every day | ORAL | Status: DC
  Administered 2019-02-03: 09:00:00 5 mg via ORAL
  Filled 2019-02-03: qty 1

## 2019-02-03 MED ORDER — GALANTAMINE HYDROBROMIDE ER 8 MG PO CP24
8.0000 mg | ORAL_CAPSULE | Freq: Every day | ORAL | Status: DC
  Administered 2019-02-03 – 2019-02-07 (×5): 8 mg via ORAL
  Filled 2019-02-03 (×5): qty 1

## 2019-02-03 MED ORDER — CITALOPRAM HYDROBROMIDE 20 MG PO TABS
30.0000 mg | ORAL_TABLET | Freq: Every day | ORAL | Status: DC
  Administered 2019-02-03 – 2019-02-07 (×5): 30 mg via ORAL
  Filled 2019-02-03 (×3): qty 2
  Filled 2019-02-03: qty 3
  Filled 2019-02-03: qty 2

## 2019-02-03 MED ORDER — UMECLIDINIUM-VILANTEROL 62.5-25 MCG/INH IN AEPB
1.0000 | INHALATION_SPRAY | Freq: Every day | RESPIRATORY_TRACT | Status: DC
  Administered 2019-02-03 – 2019-02-07 (×5): 1 via RESPIRATORY_TRACT
  Filled 2019-02-03: qty 14

## 2019-02-03 MED ORDER — POTASSIUM CHLORIDE CRYS ER 20 MEQ PO TBCR
20.0000 meq | EXTENDED_RELEASE_TABLET | ORAL | Status: DC
  Administered 2019-02-03 – 2019-02-07 (×3): 20 meq via ORAL
  Filled 2019-02-03 (×4): qty 1

## 2019-02-03 MED ORDER — DOCUSATE SODIUM 100 MG PO CAPS
100.0000 mg | ORAL_CAPSULE | Freq: Every day | ORAL | Status: DC
  Administered 2019-02-03 – 2019-02-06 (×4): 100 mg via ORAL
  Filled 2019-02-03 (×4): qty 1

## 2019-02-03 MED ORDER — METOPROLOL TARTRATE 25 MG PO TABS: 25.0000 mg | ORAL_TABLET | ORAL | Status: DC

## 2019-02-03 MED ORDER — ADULT MULTIVITAMIN W/MINERALS CH
1.0000 | ORAL_TABLET | Freq: Every day | ORAL | Status: DC
  Administered 2019-02-03 – 2019-02-07 (×5): 1 via ORAL
  Filled 2019-02-03 (×5): qty 1

## 2019-02-03 MED ORDER — FUROSEMIDE 10 MG/ML IJ SOLN
20.0000 mg | Freq: Two times a day (BID) | INTRAMUSCULAR | Status: DC
  Administered 2019-02-03 – 2019-02-04 (×2): 20 mg via INTRAVENOUS
  Filled 2019-02-03 (×2): qty 2

## 2019-02-03 MED ORDER — POLYVINYL ALCOHOL 1.4 % OP SOLN: 1.0000 [drp] | OPHTHALMIC | Status: DC | PRN

## 2019-02-03 MED ORDER — FUROSEMIDE 10 MG/ML IJ SOLN
20.0000 mg | Freq: Every day | INTRAMUSCULAR | Status: DC
  Administered 2019-02-03: 20 mg via INTRAVENOUS
  Filled 2019-02-03: qty 2

## 2019-02-03 MED ORDER — IOHEXOL 350 MG/ML SOLN
80.0000 mL | Freq: Once | INTRAVENOUS | Status: AC | PRN
  Administered 2019-02-03: 80 mL via INTRAVENOUS

## 2019-02-03 MED ORDER — LORAZEPAM 0.5 MG PO TABS
0.5000 mg | ORAL_TABLET | Freq: Two times a day (BID) | ORAL | Status: DC
  Administered 2019-02-03 – 2019-02-06 (×8): 0.5 mg via ORAL
  Filled 2019-02-03 (×8): qty 1

## 2019-02-03 MED ORDER — PSYLLIUM 95 % PO PACK
1.0000 | PACK | Freq: Two times a day (BID) | ORAL | Status: DC
  Administered 2019-02-03 – 2019-02-06 (×8): 1 via ORAL
  Filled 2019-02-03 (×8): qty 1

## 2019-02-03 MED ORDER — LORAZEPAM 0.5 MG PO TABS
0.5000 mg | ORAL_TABLET | Freq: Every day | ORAL | Status: DC | PRN
  Administered 2019-02-03: 0.5 mg via ORAL
  Filled 2019-02-03: qty 1

## 2019-02-03 MED ORDER — ONDANSETRON HCL 4 MG PO TABS: 4.0000 mg | ORAL_TABLET | Freq: Four times a day (QID) | ORAL | Status: DC | PRN

## 2019-02-03 MED ORDER — APIXABAN 2.5 MG PO TABS
2.5000 mg | ORAL_TABLET | Freq: Two times a day (BID) | ORAL | Status: DC
  Administered 2019-02-03 – 2019-02-07 (×9): 2.5 mg via ORAL
  Filled 2019-02-03 (×10): qty 1

## 2019-02-03 MED ORDER — METOPROLOL TARTRATE 50 MG PO TABS
50.0000 mg | ORAL_TABLET | Freq: Every day | ORAL | Status: DC
  Administered 2019-02-03 – 2019-02-04 (×2): 50 mg via ORAL
  Filled 2019-02-03: qty 1
  Filled 2019-02-03: qty 2

## 2019-02-03 NOTE — Progress Notes (Signed)
  Echocardiogram 2D Echocardiogram has been performed.  [name] 02/03/2019, 12:13 PM

## 2019-02-03 NOTE — ED Notes (Signed)
Lunch Tray Ordered @ 1042. 

## 2019-02-03 NOTE — H&P (Addendum)
History and Physical    Yolanda Dagoarlene S Durnil GEX:[id number] DOB: Dec 02, 1923 DOA: 02/02/2019  PCP: Rodrigo RanPerini, Mark, MD  Patient coming from: Assisted living facility.  Chief Complaint: Shortness of breath.  History obtained from patient's daughter.  HPI: Yolanda Baker is a 85 y.o. female with history of A. fib hypertension, stroke, dementia was brought to the ER after patient became acutely short of breath after supper last evening.  Patient did not have any chest pain fever chills or productive cough.  Has been experiencing palpitations last 24 hours.  ED Course: In the ER patient was found to be in A. fib with RVR and chest x-ray shows pleural effusion and which is concerning for CHF.  Labs show BNP of 335 high-sensitivity troponin of 9 creatinine 1.8 hemoglobin 12.9 platelets 234.  Patient's heart rate improved with 2 doses of IV metoprolol and was given Lasix 20 mg IV.  Patient admitted for further management of acute CHF with A. fib with RVR.  COVID-19 test was negative.  Review of Systems: As per HPI, rest all negative.   Past Medical History:  Diagnosis Date  . Anxiety   . Arthritis    neck  . Bruises easily   . Depression   . Dysrhythmia    hx A-FLUTTER after hip surg April 2015- take metoprolol  . Eczema   . Hip joint pain    RT  . Hypertension   . Memory loss   . Osteoporosis   . Pneumonia    august 2017  . Shortness of breath    with activity  . Stroke [facility](HCC)    "mini stroke" per MRI - no deficiets     Past Surgical History:  Procedure Laterality Date  . ABDOMINAL HYSTERECTOMY    . FRACTURE SURGERY Right 06/05/2013   hip  . HIP ARTHROPLASTY Right 10/03/2013   Procedure: CONVERSION OF PREVIOUS RIGHT HIP SURGERY TO A RIGHT TOTAL HIP ARTHROPLASTY;  Surgeon: Loanne DrillingFrank Aluisio V, MD;  Location: [facility];  Service: Orthopedics;  Laterality: Right;  . INTRAMEDULLARY (IM) NAIL INTERTROCHANTERIC Right 06/05/2013   Procedure: INTRAMEDULLARY (IM) NAIL INTERTROCHANTRIC;  Surgeon: Loanne DrillingFrank V  Aluisio, MD;  Location: [facility];  Service: Orthopedics;  Laterality: Right;  . JOINT REPLACEMENT     left hip  . left hip Left 2008  . LEG SURGERY Right    x2     reports that she has never smoked. She has never used smokeless tobacco. She reports that she does not drink alcohol or use drugs.  Allergies  Allergen Reactions  . Flagyl [Metronidazole]     Fingers get "numb"    Family History  Problem Relation Age of Onset  . Heart attack Father   . Hypertension Father   . Hypertension Sister   . Stroke Sister   . Hypertension Brother     Prior to Admission medications   Medication Sig Start Date End Date Taking? Authorizing Provider  acetaminophen (TYLENOL) 500 MG tablet Take 1 tablet (500 mg total) by mouth 2 (two) times daily as needed for mild pain or moderate pain. Patient taking differently: Take 1,000 mg by mouth every 6 (six) hours.  03/02/15  Yes Leta BaptistNguyen, Emily Roe, MD  albuterol (VENTOLIN HFA) 108 (90 Base) MCG/ACT inhaler Inhale 2 puffs into the lungs every 6 (six) hours as needed for wheezing or shortness of breath.   Yes [provider]  amLODipine (NORVASC) 5 MG tablet Take 5 mg by mouth daily.   Yes [provider]  apixaban (ELIQUIS) 2.5 MG TABS tablet Take 1 tablet (2.5 mg total) by mouth 2 (two) times daily. 07/01/14  Yes Donzetta Starch, NP  chlorhexidine (PERIDEX) 0.12 % solution Use as directed 15 mLs in the mouth or throat 2 (two) times daily. Swish and spit   Yes [provider]  Chlorpheniramine-APAP (CORICIDIN) 2-325 MG TABS Take 1 tablet by mouth 2 (two) times daily as needed (congestion).   Yes [provider]  citalopram (CELEXA) 10 MG tablet Take 30 mg by mouth daily.  01/03/15  Yes [provider]  docusate sodium (COLACE) 100 MG capsule Take 100 mg by mouth at bedtime.   Yes [provider]  galantamine (RAZADYNE ER) 8 MG 24 hr capsule Take 8 mg by mouth daily with breakfast.   Yes [provider]   LORazepam (ATIVAN) 0.5 MG tablet Take one tablet by mouth every night at bedtime for rest Patient taking differently: Take 0.5 mg by mouth See admin instructions. Take one tablet (0.5 mg) by mouth twice daily - 2:30pm and 8pm, may also take one tablet (0.5 mg) by mouth prior to 2am as needed for anxiety 07/01/14  Yes Donzetta Starch, NP  metoprolol tartrate (LOPRESSOR) 25 MG tablet Take 25-50 mg by mouth See admin instructions. Take two tablets (50 mg) by mouth every morning and one tablet (25 mg) at night   Yes [provider]  Multiple Vitamins-Minerals (CERTAVITE SENIOR/ANTIOXIDANT PO) Take 1 tablet by mouth daily.    Yes [provider]  Polyethyl Glycol-Propyl Glycol (SYSTANE) 0.4-0.3 % SOLN Place 1 drop into both eyes every hour as needed (dry eyes/irritation).   Yes [provider]  polyethylene glycol (MIRALAX / GLYCOLAX) packet Take 17 g by mouth daily as needed (constipation). Mix in 4-8 oz liquid and drink   Yes [provider]  potassium chloride SA (K-DUR,KLOR-CON) 20 MEQ tablet Take 20 mEq by mouth every other day.   Yes [provider]  Psyllium (METAMUCIL PO) Take 5 mLs by mouth 2 (two) times daily. Mix in water and drink   Yes [provider]  simvastatin (ZOCOR) 10 MG tablet Take 1 tablet (10 mg total) by mouth daily at 6 PM. 07/01/14  Yes Biby, Massie Kluver, NP  Skin Protectants, Misc. (CALAZIME SKIN PROTECTANT EX) Apply 1 application topically See admin instructions. Apply topically to affected area of coccyx and buttocks as needed for rash/irritation   Yes [provider]  umeclidinium-vilanterol (ANORO ELLIPTA) 62.5-25 MCG/INH AEPB Inhale 1 puff into the lungs daily.   Yes [provider]  ziprasidone (GEODON) 60 MG capsule Take 60 mg by mouth at bedtime.  06/08/15  Yes [provider]  Acetaminophen-Codeine (TYLENOL/CODEINE #3) 300-30 MG tablet Take 1 tablet by mouth every 6 (six) hours. While awake.  Do not  wake patient to give medication. Patient not taking: Reported on 02/02/2019 03/02/15   Harvel Quale, MD  amLODipine (NORVASC) 5 MG tablet TAKE 1 TABLET BY MOUTH EVERY MORNING COURTESY  01/03/16* Patient not taking: Reported on 02/02/2019 01/03/16   Estill Dooms, MD    Physical Exam: Constitutional: Moderately built and nourished. Vitals:   02/03/19 0330 02/03/19 0345 02/03/19 0421 02/03/19 0445  BP: (!) 163/94 135/90 (!) 154/99 (!) 163/99  Pulse:   (!) 52 92  Resp: (!) 26 18 18 20   Temp:      TempSrc:      SpO2:   96% 95%   Eyes: Anicteric no pallor.  ENMT: No discharge from the ears eyes nose or mouth. Neck: JVD elevated no mass felt. Respiratory: No longer crepitations. Cardiovascular: S1-S2 heard. Abdomen: Soft nontender bowel sounds present. Musculoskeletal: No edema. Skin: No rash. Neurologic: Alert awake oriented to name and place.  Moves all extremities. Psychiatric: Mood place.   Labs on Admission: I have personally reviewed following labs and imaging studies  CBC: Recent Labs  Lab 02/02/19 2050  WBC 7.6  NEUTROABS 4.7  HGB 12.9  HCT 40.3  MCV 103.6*  PLT 234   Basic Metabolic Panel: Recent Labs  Lab 02/02/19 2050  NA 136  K 4.1  CL 100  CO2 26  GLUCOSE 113*  BUN 19  CREATININE 0.84  CALCIUM 9.0   GFR: CrCl cannot be calculated (Unknown ideal weight.). Liver Function Tests: Recent Labs  Lab 02/02/19 2050  AST 31  ALT 15  ALKPHOS 87  BILITOT 0.4  PROT 7.6  ALBUMIN 3.4*   No results for input(s): LIPASE, AMYLASE in the last 168 hours. No results for input(s): AMMONIA in the last 168 hours. Coagulation Profile: No results for input(s): INR, PROTIME in the last 168 hours. Cardiac Enzymes: No results for input(s): CKTOTAL, CKMB, CKMBINDEX, TROPONINI in the last 168 hours. BNP (last 3 results) No results for input(s): PROBNP in the last 8760 hours. HbA1C: No results for input(s): HGBA1C in the last 72 hours. CBG: No results for  input(s): GLUCAP in the last 168 hours. Lipid Profile: No results for input(s): CHOL, HDL, LDLCALC, TRIG, CHOLHDL, LDLDIRECT in the last 72 hours. Thyroid Function Tests: No results for input(s): TSH, T4TOTAL, FREET4, T3FREE, THYROIDAB in the last 72 hours. Anemia Panel: No results for input(s): VITAMINB12, FOLATE, FERRITIN, TIBC, IRON, RETICCTPCT in the last 72 hours. Urine analysis:    Component Value Date/Time   COLORURINE YELLOW 02/03/2019 0007   APPEARANCEUR CLEAR 02/03/2019 0007   LABSPEC 1.014 02/03/2019 0007   PHURINE 7.0 02/03/2019 0007   GLUCOSEU NEGATIVE 02/03/2019 0007   HGBUR NEGATIVE 02/03/2019 0007   BILIRUBINUR NEGATIVE 02/03/2019 0007   KETONESUR NEGATIVE 02/03/2019 0007   PROTEINUR 100 (A) 02/03/2019 0007   UROBILINOGEN 0.2 06/29/2014 0943   NITRITE NEGATIVE 02/03/2019 0007   LEUKOCYTESUR NEGATIVE 02/03/2019 0007   Sepsis Labs: @LABRCNTIP (procalcitonin:4,lacticidven:4) ) Recent Results (from the past 240 hour(s))  SARS CORONAVIRUS 2 (TAT 6-24 HRS) Nasopharyngeal Nasopharyngeal Swab     Status: None   Collection Time: 02/02/19 11:15 PM   Specimen: Nasopharyngeal Swab  Result Value Ref Range Status   SARS Coronavirus 2 NEGATIVE NEGATIVE Final    Comment: (NOTE) SARS-CoV-2 target nucleic acids are NOT DETECTED. The SARS-CoV-2 RNA is generally detectable in upper and lower respiratory specimens during the acute phase of infection. Negative results do not preclude SARS-CoV-2 infection, do not rule out co-infections with other pathogens, and should not be used as the sole basis for treatment or other patient management decisions. Negative results must be combined with clinical observations, patient history, and epidemiological information. The expected result is Negative. Fact Sheet for Patients: HairSlick.no Fact Sheet for Healthcare Providers: quierodirigir.com This test is not yet approved or cleared by  the [place] FDA and  has been authorized for detection and/or diagnosis of SARS-CoV-2 by FDA under an Emergency Use Authorization (EUA). This EUA will remain  in effect (meaning this test can be used) for the duration of the COVID-19 declaration under Section 56 4(b)(1) of the Act, 21 U.S.C. section 360bbb-3(b)(1), unless the authorization is terminated or revoked sooner. Performed  at [facility], [address], [place], [place] [address]      Radiological Exams on Admission: Dg Chest Port 1 View  Result Date: 02/02/2019 CLINICAL DATA:  Shortness of breath and AFib for 1 day, history of pneumonia and hypertension, nonsmoker EXAM: PORTABLE CHEST 1 VIEW COMPARISON:  Radiograph 06/04/2013 FINDINGS: Hazy interstitial opacity. There is a small right pleural effusion. No pneumothorax or left effusion is seen. No consolidative opacity is present. Cardiomegaly, slightly more pronounced than on comparison portable radiographs. Atherosclerotic plaque in the aorta. Degenerative changes are present in the imaged spine and shoulders. No acute osseous or soft tissue abnormality. IMPRESSION: 1. Hazy interstitial opacity with a small right pleural effusion and increasing cardiomegaly. Findings could reflect CHF with mild edema. 2.  Aortic Atherosclerosis (ICD10-I70.0). Electronically Signed   By: Kreg Shropshire M.D.   On: 02/02/2019 20:57    EKG: Independently reviewed.  A. fib with RVR.  Assessment/Plan Principal Problem:   Acute CHF (congestive heart failure) (HCC) Active Problems:   HTN (hypertension)   Dementia (HCC)   Schizoaffective disorder (HCC)   Atrial fibrillation with rapid ventricular response (HCC)    1. Acute CHF likely diastolic precipitated by A. fib with RVR on Lasix 20 mg IV 1 dose was given will continue with Lasix 20 mg IV daily and follow intake output metabolic panel.  Last 2D echo done in 2016 showed EF of 55 to 60% with grade 3 diastolic dysfunction.  Will recheck  2D echo and check D-dimer. 2. A. fib with RVR improved with IV metoprolol.  Continue metoprolol and apixaban.  Check TSH. 3. Hypertension on metoprolol and amlodipine. 4. Dementia on Razadyne. 5. Schizoaffective disorder on Geodon and Celexa and Ativan. 6. History of stroke on apixaban. 7. COPD not actively wheezing.  Continue inhalers.  Given the acute CHF will need more than 2 midnight stay and inpatient status.   DVT prophylaxis: Apixaban. Code Status: DO NOT INTUBATE. Family Communication: Patient's daughter. Disposition Plan: Back to facility when stable. Consults called: Cardiology. Admission status: Inpatient.   Eduard Clos MD [facility] Pager [phone].  If 7PM-7AM, please contact night-coverage [email or web address] Password [email or web address]  02/03/2019, 5:14 AM

## 2019-02-03 NOTE — Progress Notes (Addendum)
Pt keeps removing ECG leads, we have tried mittens and trying to keep the patient distracted, she is still refusing. Pt very agitated and anxious PRN ativan given  MD notified

## 2019-02-03 NOTE — ED Notes (Signed)
hospitalist at the bedside 

## 2019-02-03 NOTE — Progress Notes (Signed)
PROGRESS NOTE  Yolanda Baker SVX:[id number] DOB: 07-01-1923 DOA: 02/02/2019 PCP: Crist Infante, MD  Brief History   The patient is a 85 yr old woman who presented to [facility] with complaints of shortness of breath. She carries a past medical history significant for anxiety, arthritis, depression, hypertension, atrial flutter following surgery, memory loss, "mini-strokes".  She was found to be in atrial fibrillation with RVR. Cardiology has been consulted. Rate was initially controlled with oral metoprolol. Rate has increased again in the last couple of hours. Cardiology has been consulted.  Consultants  . Cardiology  Procedures  . None  Antibiotics   Anti-infectives (From admission, onward)   None    .  Subjective  The patient is resting comfortably. No new complaints.  Objective   Vitals:  Vitals:   02/03/19 1106 02/03/19 1213  BP: (!) 135/94 (!) 134/93  Pulse: (!) 120 (!) 119  Resp: 16 18  Temp:  98.7 F (37.1 C)  SpO2: 98% 98%    Exam:  Constitutional:  . The patient is awake, alert, and oriented x 3. No acute distress. Eyes:  . pupils and irises appear normal . Normal lids and conjunctivae ENMT:  . grossly normal hearing  . Lips appear normal . external ears, nose appear normal . Oropharynx: mucosa, tongue,posterior pharynx appear normal Neck:  . neck appears normal, no masses, normal ROM, supple . no thyromegaly Respiratory:  . No increased work of breathing. . No wheezes, rales, or rhonchi . No tactile fremitus Cardiovascular:  . Regular rate and rhythm . No murmurs, ectopy, or gallups. . No lateral PMI. No thrills. Abdomen:  . Abdomen is soft, non-tender, non-distended . No hernias, masses, or organomegaly . Normoactive bowel sounds.  Musculoskeletal:  . No cyanosis, clubbing, or edema Skin:  . No rashes, lesions, ulcers . palpation of skin: no induration or nodules Neurologic:  . CN 2-12 intact . Sensation all 4 extremities intact  Psychiatric:  . Mental status o Mood, affect appropriate o Orientation to person, place, time  . judgment and insight appear intact  I have personally reviewed the following:   Today's Data  . Vitals, CMP, BMP  Imaging  . CTA chest  Cardiology Data  . EKG: AF with RVR . Echocardiogram: EF 50-55%, possible cardiac amyloidosis, indeterminate diastolic function, nl global right systolic function  Scheduled Meds: . apixaban  2.5 mg Oral BID  . citalopram  30 mg Oral Daily  . docusate sodium  100 mg Oral QHS  . furosemide  20 mg Intravenous BID  . galantamine  8 mg Oral Q breakfast  . LORazepam  0.5 mg Oral BID  . [START ON 02/04/2019] losartan  25 mg Oral Daily  . metoprolol tartrate  50 mg Oral Daily   And  . metoprolol tartrate  25 mg Oral QHS  . multivitamin with minerals  1 tablet Oral Daily  . potassium chloride SA  20 mEq Oral QODAY  . psyllium  1 packet Oral BID  . simvastatin  10 mg Oral q1800  . umeclidinium-vilanterol  1 puff Inhalation Daily  . ziprasidone  60 mg Oral QHS   Continuous Infusions:  Principal Problem:   Acute CHF (congestive heart failure) (HCC) Active Problems:   HTN (hypertension)   Dementia (HCC)   Schizoaffective disorder (HCC)   Atrial fibrillation with rapid ventricular response (HCC)   LOS: 0 days   A & P  Acute CHF likely diastolic precipitated by A. fib with RVR on Lasix 20 mg  IV 1 dose was given will continue with Lasix 20 mg IV daily and follow intake output metabolic panel.  Last 2D echo done in 2016 showed EF of 55 to 60% with grade 3 diastolic dysfunction.  Will recheck 2D echo and check D-dimer.  A. fib with RVR improved with IV metoprolol.  Continue metoprolol and apixaban.  Check TSH.  Hypertension on metoprolol and amlodipine.  Dementia on Razadyne.  Schizoaffective disorder on Geodon and Celexa and Ativan.  History of stroke on apixaban.  COPD not actively wheezing.  Continue inhalers.  I have seen and examined  this patient myself. I have spent 35 minutes in her evaluation and care.  Jaidyn Usery, DO [facility] Direct contact: see [email or web address]  7PM-7AM contact night coverage as above 02/03/2019, 4:53 PM  LOS: 0 days

## 2019-02-03 NOTE — Social Work (Signed)
Pt from [place]. CSW continuing to follow for support with disposition when medically appropriate.  Westley Hummer, MSW, [place] Work [phone]

## 2019-02-03 NOTE — Consult Note (Signed)
Cardiology Consultation:   Patient ID: Yolanda Baker MRN: [id number]; DOB: 08/19/1923  Admit date: 02/02/2019 Date of Consult: 02/03/2019  Primary Care Provider: Rodrigo RanPerini, Mark, MD Primary Cardiologist: Yolanda Baker Primary Electrophysiologist:  None    Patient Profile:   Yolanda Baker is a 85 y.o. female with a hx of atrial fibrillation/flutter, hypertension, stroke, and dementia who is being seen today for the evaluation of atrial fibrillation and CHF at the request of Dr. Gerri Baker.  History of Present Illness:   Yolanda Baker [facility] for evaluation of sudden onset of shortness of breath that began after supper.  She had been experiencing palpitations for the prior 24 hours.  In the ED she was found to be in atrial fibrillation with RVR and chest x-ray showed pleural effusion which was concerning for CHF.  BNP was 335.  High-sensitivity troponin was not elevated, 9.  Serum creatinine 1.8.  Hemoglobin 12.9.  D-dimer was elevated at 1.50.  TSH was very slightly elevated at 4.514.  COVID-19 test was negative.  Echocardiogram has been ordered.  Patient's heart rate improved after 2 doses of IV metoprolol.  She was also given Lasix 20 mg IV.  Yolanda Baker was first diagnosed with atrial fibrillation/flutter in 2015, initially not anticoagulated due to advanced age and frequent falls.  She had a stroke treated with TPA in 2016 and was subsequently placed on Eliquis for anticoagulation, now on reduced dose due to weight less than 60 kg and age.  She has been rate controlled on metoprolol 50 mg in the morning and 25 mg in the evening.  On exam, Yolanda Baker is a very pleasant lady. She lives in [facility] living [facility]. She says that she gets around with a walker and goes to the dining room for meals. Last evening after her evening meal, she developed shortness of breath and mild chest tightness. She thinks that she was having some heart  palpitations, but does not have a clear memory. She states that she often has mild pedal edema but it goes down overnight. She has never been treated by a cardiologist. She does not smoke or drink alcohol. Her medications are administered by staff at her facility.   Currently she says that she feels about the same, no better or worse.   Heart Pathway Score:     Past Medical History:  Diagnosis Date  . Anxiety   . Arthritis    neck  . Bruises easily   . Depression   . Dysrhythmia    hx A-FLUTTER after hip surg April 2015- take metoprolol  . Eczema   . Hip joint pain    RT  . Hypertension   . Memory loss   . Osteoporosis   . Pneumonia    august 2017  . Shortness of breath    with activity  . Stroke [facility](HCC)    "mini stroke" per MRI - no deficiets     Past Surgical History:  Procedure Laterality Date  . ABDOMINAL HYSTERECTOMY    . FRACTURE SURGERY Right 06/05/2013   hip  . HIP ARTHROPLASTY Right 10/03/2013   Procedure: CONVERSION OF PREVIOUS RIGHT HIP SURGERY TO A RIGHT TOTAL HIP ARTHROPLASTY;  Surgeon: Loanne DrillingFrank Aluisio V, MD;  Location: [facility];  Service: Orthopedics;  Laterality: Right;  . INTRAMEDULLARY (IM) NAIL INTERTROCHANTERIC Right 06/05/2013   Procedure: INTRAMEDULLARY (IM) NAIL INTERTROCHANTRIC;  Surgeon: Loanne DrillingFrank V Aluisio, MD;  Location:  [facility];  Service: Orthopedics;  Laterality: Right;  . JOINT REPLACEMENT     left hip  . left hip Left 2008  . LEG SURGERY Right    x2     Home Medications:  Prior to Admission medications   Medication Sig Start Date End Date Taking? Authorizing Provider  acetaminophen (TYLENOL) 500 MG tablet Take 1 tablet (500 mg total) by mouth 2 (two) times daily as needed for mild pain or moderate pain. Patient taking differently: Take 1,000 mg by mouth every 6 (six) hours.  03/02/15  Yes Leta BaptistNguyen, Emily Roe, MD  albuterol (VENTOLIN HFA) 108 (90 Base) MCG/ACT inhaler Inhale 2 puffs into the lungs every 6 (six) hours as needed for wheezing or shortness of  breath.   Yes [provider]  amLODipine (NORVASC) 5 MG tablet Take 5 mg by mouth daily.   Yes [provider]  apixaban (ELIQUIS) 2.5 MG TABS tablet Take 1 tablet (2.5 mg total) by mouth 2 (two) times daily. 07/01/14  Yes Layne BentonBiby, Sharon L, NP  chlorhexidine (PERIDEX) 0.12 % solution Use as directed 15 mLs in the mouth or throat 2 (two) times daily. Swish and spit   Yes [provider]  Chlorpheniramine-APAP (CORICIDIN) 2-325 MG TABS Take 1 tablet by mouth 2 (two) times daily as needed (congestion).   Yes [provider]  citalopram (CELEXA) 10 MG tablet Take 30 mg by mouth daily.  01/03/15  Yes [provider]  docusate sodium (COLACE) 100 MG capsule Take 100 mg by mouth at bedtime.   Yes [provider]  galantamine (RAZADYNE ER) 8 MG 24 hr capsule Take 8 mg by mouth daily with breakfast.   Yes [provider]  LORazepam (ATIVAN) 0.5 MG tablet Take one tablet by mouth every night at bedtime for rest Patient taking differently: Take 0.5 mg by mouth See admin instructions. Take one tablet (0.5 mg) by mouth twice daily - 2:30pm and 8pm, may also take one tablet (0.5 mg) by mouth prior to 2am as needed for anxiety 07/01/14  Yes Layne BentonBiby, Sharon L, NP  metoprolol tartrate (LOPRESSOR) 25 MG tablet Take 25-50 mg by mouth See admin instructions. Take two tablets (50 mg) by mouth every morning and one tablet (25 mg) at night   Yes [provider]  Multiple Vitamins-Minerals (CERTAVITE SENIOR/ANTIOXIDANT PO) Take 1 tablet by mouth daily.    Yes [provider]  Polyethyl Glycol-Propyl Glycol (SYSTANE) 0.4-0.3 % SOLN Place 1 drop into both eyes every hour as needed (dry eyes/irritation).   Yes [provider]  polyethylene glycol (MIRALAX / GLYCOLAX) packet Take 17 g by mouth daily as needed (constipation). Mix in 4-8 oz liquid and drink   Yes [provider]  potassium chloride SA (K-DUR,KLOR-CON) 20 MEQ tablet Take 20 mEq  by mouth every other day.   Yes [provider]  Psyllium (METAMUCIL PO) Take 5 mLs by mouth 2 (two) times daily. Mix in water and drink   Yes [provider]  simvastatin (ZOCOR) 10 MG tablet Take 1 tablet (10 mg total) by mouth daily at 6 PM. 07/01/14  Yes Biby, Jani FilesSharon L, NP  Skin Protectants, Misc. (CALAZIME SKIN PROTECTANT EX) Apply 1 application topically See admin instructions. Apply topically to affected area of coccyx and buttocks as needed for rash/irritation   Yes [provider]  umeclidinium-vilanterol (ANORO ELLIPTA) 62.5-25 MCG/INH AEPB Inhale 1 puff into the lungs daily.   Yes [provider]  ziprasidone (GEODON) 60 MG  capsule Take 60 mg by mouth at bedtime.  06/08/15  Yes [provider]  Acetaminophen-Codeine (TYLENOL/CODEINE #3) 300-30 MG tablet Take 1 tablet by mouth every 6 (six) hours. While awake.  Do not wake patient to give medication. Patient not taking: Reported on 02/02/2019 03/02/15   Leta Baptist, MD  amLODipine (NORVASC) 5 MG tablet TAKE 1 TABLET BY MOUTH EVERY MORNING * COURTESY FILL 01/03/16* Patient not taking: Reported on 02/02/2019 01/03/16   Kimber Relic, MD    Inpatient Medications: Scheduled Meds: . amLODipine  5 mg Oral Daily  . apixaban  2.5 mg Oral BID  . citalopram  30 mg Oral Daily  . docusate sodium  100 mg Oral QHS  . furosemide  20 mg Intravenous Daily  . galantamine  8 mg Oral Q breakfast  . LORazepam  0.5 mg Oral BID  . metoprolol tartrate  50 mg Oral Daily   And  . metoprolol tartrate  25 mg Oral QHS  . multivitamin with minerals  1 tablet Oral Daily  . potassium chloride SA  20 mEq Oral QODAY  . psyllium  1 packet Oral BID  . simvastatin  10 mg Oral q1800  . umeclidinium-vilanterol  1 puff Inhalation Daily  . ziprasidone  60 mg Oral QHS   Continuous Infusions:  PRN Meds: acetaminophen **OR** acetaminophen, albuterol, LORazepam, ondansetron **OR** ondansetron (ZOFRAN) IV, polyethylene  glycol, polyvinyl alcohol  Allergies:    Allergies  Allergen Reactions  . Flagyl [Metronidazole]     Fingers get "numb"    Social History:   Social History   Socioeconomic History  . Marital status: Widowed    Spouse name: Not on file  . Number of children: 3  . Years of education: 49yr colleg  . Highest education level: Not on file  Occupational History  . Occupation: Retired   Engineer, production  . Financial resource strain: Not on file  . Food insecurity    Worry: Not on file    Inability: Not on file  . Transportation needs    Medical: Not on file    Non-medical: Not on file  Tobacco Use  . Smoking status: Never Smoker  . Smokeless tobacco: Never Used  Substance and Sexual Activity  . Alcohol use: No    Alcohol/week: 0.0 standard drinks  . Drug use: No  . Sexual activity: Never  Lifestyle  . Physical activity    Days per week: Not on file    Minutes per session: Not on file  . Stress: Not on file  Relationships  . Social Musician on phone: Not on file    Gets together: Not on file    Attends religious service: Not on file    Active member of club or organization: Not on file    Attends meetings of clubs or organizations: Not on file    Relationship status: Not on file  . Intimate partner violence    Fear of current or ex partner: Not on file    Emotionally abused: Not on file    Physically abused: Not on file    Forced sexual activity: Not on file  Other Topics Concern  . Not on file  Social History Narrative   Drinks about 3 cups of coffee a day     Family History:    Family History  Problem Relation Age of Onset  . Heart attack Father   . Hypertension Father   . Hypertension Sister   .  Stroke Sister   . Hypertension Brother      ROS:  Please see the history of present illness.   All other ROS reviewed and negative.     Physical Exam/Data:   Vitals:   02/03/19 0545 02/03/19 0615 02/03/19 0645 02/03/19 0745  BP: (!) 136/91 (!)  143/96 (!) 157/108 (!) 150/99  Pulse: (!) 105 69 (!) 57 80  Resp: (!) 21 (!) 22 17 20   Temp:      TempSrc:      SpO2: 98% 100% 98% 98%   No intake or output data in the 24 hours ending 02/03/19 1036 Last 3 Weights 06/13/2017 07/03/2016 11/01/2015  Weight (lbs) 101 lb 12.8 oz 106 lb 97 lb 12.8 oz  Weight (kg) 46.176 kg 48.081 kg 44.362 kg     There is no height or weight on file to calculate BMI.  General:  Thin, frail elderly female, in no acute distress HEENT: normal Neck: no JVD Vascular: No carotid bruits; FA pulses 2+ bilaterally without bruits  Cardiac:  normal S1, S2; Irregularly irregular rhythm; no murmur  Lungs:  clear to auscultation bilaterally, no wheezing, rhonchi or rales  Abd: soft, nontender, no hepatomegaly  Ext: no edema Musculoskeletal:  generalized muscle wasting.  Skin: warm and dry  Neuro:  CNs 2-12 intact, no focal abnormalities noted Psych:  Normal affect   EKG:  The EKG was personally reviewed and demonstrates: Atrial fibrillation, 122 bpm, possible old anteroseptal infarct-no Yolanda changes except atrial fibrillation Telemetry:  Telemetry was personally reviewed and demonstrates:  Atrial fibrillation, was in the 90's early this am, now in the 100's-110's.   Relevant CV Studies:  Echocardiogram pending  Echocardiogram 06/30/2014 Study Conclusions  - Left ventricle: The cavity size was normal. Systolic function was   normal. The estimated ejection fraction was in the range of 55%   to 60%. Wall motion was normal; there were no regional wall   motion abnormalities. There was a reduced contribution of atrial   contraction to ventricular filling, due to increased ventricular   diastolic pressure or atrial contractile dysfunction. Doppler   parameters are consistent with a reversible restrictive pattern,   indicative of decreased left ventricular diastolic compliance   and/or increased left atrial pressure (grade 3 diastolic   dysfunction). - Mitral valve:  There was mild regurgitation. - Left atrium: The atrium was mildly dilated. - Tricuspid valve: There was mild-moderate regurgitation. - Pulmonic valve: There was trivial regurgitation. - Pulmonary arteries: PA peak pressure: 33 mm Hg (S).   Laboratory Data:  High Sensitivity Troponin:   Recent Labs  Lab 02/02/19 2050 02/02/19 2219 02/03/19 0841  TROPONINIHS 9 12 14      Chemistry Recent Labs  Lab 02/02/19 2050 02/03/19 0843  NA 136 137  K 4.1 3.6  CL 100 98  CO2 26 27  GLUCOSE 113* 101*  BUN 19 13  CREATININE 0.84 0.76  CALCIUM 9.0 8.6*  GFRNONAA 60* >60  GFRAA >60 >60  ANIONGAP 10 12    Recent Labs  Lab 02/02/19 2050 02/03/19 0843  PROT 7.6 7.3  ALBUMIN 3.4* 3.4*  AST 31 35  ALT 15 11  ALKPHOS 87 73  BILITOT 0.4 1.1   Hematology Recent Labs  Lab 02/02/19 2050 02/03/19 0843  WBC 7.6 6.9  RBC 3.89 4.04  HGB 12.9 13.4  HCT 40.3 41.7  MCV 103.6* 103.2*  MCH 33.2 33.2  MCHC 32.0 32.1  RDW 14.0 13.8  PLT 234 225   BNP Recent  Labs  Lab 02/02/19 2200  BNP 335.0*    DDimer  Recent Labs  Lab 02/03/19 0843  DDIMER 1.50*     Radiology/Studies:  Dg Chest Port 1 View  Result Date: 02/02/2019 CLINICAL DATA:  Shortness of breath and AFib for 1 day, history of pneumonia and hypertension, nonsmoker EXAM: PORTABLE CHEST 1 VIEW COMPARISON:  Radiograph 06/04/2013 FINDINGS: Hazy interstitial opacity. There is a small right pleural effusion. No pneumothorax or left effusion is seen. No consolidative opacity is present. Cardiomegaly, slightly more pronounced than on comparison portable radiographs. Atherosclerotic plaque in the aorta. Degenerative changes are present in the imaged spine and shoulders. No acute osseous or soft tissue abnormality. IMPRESSION: 1. Hazy interstitial opacity with a small right pleural effusion and increasing cardiomegaly. Findings could reflect CHF with mild edema. 2.  Aortic Atherosclerosis (ICD10-I70.0). Electronically Signed   By:  Kreg Shropshire M.D.   On: 02/02/2019 20:57    Assessment and Plan:   1. Atrial fibrillation with RVR -Patient presented with sudden onset of shortness of breath after eating supper last night, palpitations x24 hours.  Found to be in atrial fibrillation with RVR. -Patient has a history of atrial fib/flutter in 2015.  She had a stroke in 2016, not on anticoagulation at the time. -D-dimer was elevated at 1.50, chest CTA is ordered to evaluate for PE.  TSH was very slightly elevated at 4.514. -CHA2DS2/VAS Stroke Risk Score is 7 (CHF, HTN, age (2), stroke (2), female). She is on Eliquis 2.5 mg twice daily for stroke risk reduction, adjusted dose due to weight less than 60 kg and age. -Heart rate improved with IV metoprolol x2 in the ED.  She continues on her home metoprolol 50 mg in the morning and 25 mg in the evening. HR was in the 90's early this am now in the 100's-110's. I will give an additional dose of metoprolol 5 mg IV. Her BP is also elevated.  -Assess echocardiogram for LV function, valvular disease, wall motion. -She may need increase in her home beta blocker. Could start Cardizem if needed for rate control (if started, would stop amlodipine).  -Dr. Duke Salvia to see pt with further recommendations.    2. CHF -BNP 335. Troponins 12,14.  -Chest x-ray with hazy interstitial opacity with a small right pleural effusion increasing cardiomegaly.  Findings could reflect CHF with mild edema. -Pt with mild shortness of breath with exertion, no orthopnea or edema. May be related to afib with RVR.  -Patient is being gently diuresed with Lasix 20 mg IV X 2 doses. -Echocardiogram ordered.  -Monitor strict intake and output and daily weights  3. Hypertension -Home medications include amlodipine 5 mg and metoprolol -Blood pressure is elevated, 147/105 -Consider increasing metoprolol for elevated heart rate as well as blood pressure vs adding cardizem (stopping amlodipine).   For questions or updates,  please contact [facility] Please consult [email or web address] for contact info under     Signed, Berton Bon, NP  02/03/2019 10:36 AM

## 2019-02-04 DIAGNOSIS — I5032 Chronic diastolic (congestive) heart failure: Secondary | ICD-10-CM

## 2019-02-04 DIAGNOSIS — Z66 Do not resuscitate: Secondary | ICD-10-CM | POA: Diagnosis present

## 2019-02-04 DIAGNOSIS — I5033 Acute on chronic diastolic (congestive) heart failure: Secondary | ICD-10-CM

## 2019-02-04 MED ORDER — FUROSEMIDE 20 MG PO TABS: 20.0000 mg | ORAL_TABLET | Freq: Every day | ORAL | 0 refills | Status: DC

## 2019-02-04 MED ORDER — METOPROLOL TARTRATE 50 MG PO TABS
75.0000 mg | ORAL_TABLET | Freq: Two times a day (BID) | ORAL | Status: DC
  Administered 2019-02-04 – 2019-02-05 (×3): 75 mg via ORAL
  Filled 2019-02-04 (×3): qty 1

## 2019-02-04 MED ORDER — AMLODIPINE BESYLATE 5 MG PO TABS
5.0000 mg | ORAL_TABLET | Freq: Every day | ORAL | Status: DC
  Administered 2019-02-05 – 2019-02-06 (×2): 5 mg via ORAL
  Filled 2019-02-04 (×3): qty 1

## 2019-02-04 MED ORDER — FUROSEMIDE 20 MG PO TABS
20.0000 mg | ORAL_TABLET | Freq: Every day | ORAL | Status: DC
  Administered 2019-02-05 – 2019-02-06 (×2): 20 mg via ORAL
  Filled 2019-02-04 (×3): qty 1

## 2019-02-04 MED ORDER — METOPROLOL TARTRATE 75 MG PO TABS: 75.0000 mg | ORAL_TABLET | Freq: Two times a day (BID) | ORAL | 0 refills | Status: DC

## 2019-02-04 MED ORDER — METOPROLOL TARTRATE 50 MG PO TABS: 50.0000 mg | ORAL_TABLET | Freq: Two times a day (BID) | ORAL | Status: DC

## 2019-02-04 MED ORDER — LORAZEPAM 0.5 MG PO TABS: 0.5000 mg | ORAL_TABLET | Freq: Two times a day (BID) | ORAL | 0 refills | Status: DC

## 2019-02-04 NOTE — NC FL2 (Addendum)
[place] MEDICAID FL2 LEVEL OF CARE SCREENING TOOL     IDENTIFICATION  Patient Name: Yolanda Baker Birthdate: 1923/10/24 Sex: female Admission Date (Current Location): 02/02/2019  [facility] and [place] Number:  Herbalist and Address:  The [place], [place] [address] [place], [place] [address]      Provider Number: [id number]  Attending Physician Name and Address:  Karie Kirks, DO  Relative Name and Phone Number:       Current Level of Care: Hospital Recommended Level of Care: [place] Prior Approval Number:    Date Approved/Denied:   PASRR Number:    Discharge Plan: Other (Comment)([facility])    Current Diagnoses: Patient Active Problem List   Diagnosis Date Noted  . Acute on chronic diastolic heart failure ([place])   . Acute CHF (congestive heart failure) ([place]) 02/03/2019  . Atrial fibrillation with rapid ventricular response ([place]) 02/03/2019  . GAD (generalized anxiety disorder) 01/15/2018  . Schizoaffective disorder ([place]) 01/15/2018  . Hyperlipidemia LDL goal <70 07/01/2014  . Constipation chronic 07/01/2014  . Acute CVA (cerebrovascular accident) ([place])   . Acute blood loss anemia 10/12/2013  . Postoperative anemia due to acute blood loss 10/04/2013  . Postop Transfusion 10/04/2013  . Closed fracture of femur with nonunion 10/03/2013  . Femur fracture, right ([place]) 10/03/2013  . Closed femur fracture ([place]) 10/03/2013  . UTI (urinary tract infection) 06/23/2013  . Dementia ([place]) 06/09/2013  . Anxiety 06/09/2013  . Atrial flutter ([place]) 06/07/2013  . Hip fracture ([place]) 06/04/2013  . Depression 06/04/2013  . Anxiety state 06/04/2013  . HTN (hypertension) 06/04/2013  . Closed right hip fracture ([place]) 06/04/2013    Orientation RESPIRATION BLADDER Height & Weight     Self, Time, Place, Situation  Normal Continent Weight: 93 lb 14.7 oz (42.6 kg) Height:     BEHAVIORAL SYMPTOMS/MOOD NEUROLOGICAL BOWEL  NUTRITION STATUS      Continent Diet (regular diet)  AMBULATORY STATUS COMMUNICATION OF NEEDS Skin   Limited Assist Verbally Other (Comment), Normal(generalized ecchymosis)                       Personal Care Assistance Level of Assistance  Bathing, Feeding, Dressing Bathing Assistance: Limited assistance Feeding assistance: Independent Dressing Assistance: Limited assistance     Functional Limitations Info  Sight, Hearing, Speech Sight Info: Adequate Hearing Info: Adequate Speech Info: Adequate    SPECIAL CARE FACTORS FREQUENCY                       Contractures Contractures Info: Not present    Additional Factors Info  Code Status, Allergies, Psychotropic Code Status Info: Full Code Allergies Info: Flagyl (Metronidazole) Psychotropic Info: LORazepam (ATIVAN) tablet 0.5 mg daily PO; citalopram (CELEXA) tablet 30 mg daily PO; galantamine (RAZADYNE ER) 24 hr capsule 8 mg daily with breakfast PO; ziprasidone (GEODON) capsule 60 mg daily at bedtime PO         Current Medications (02/04/2019):  This is the current hospital active medication list Current Facility-Administered Medications  Medication Dose Route Frequency Provider Last Rate Last Dose  . acetaminophen (TYLENOL) tablet 650 mg  650 mg Oral Q6H PRN Rise Patience, MD       Or  . acetaminophen (TYLENOL) suppository 650 mg  650 mg Rectal Q6H PRN Rise Patience, MD      . albuterol (PROVENTIL) (2.5 MG/3ML) 0.083% nebulizer solution 2.5 mg  2.5 mg Inhalation  Q6H PRN Eduard Clos, MD      . Melene Muller ON 02/05/2019] amLODipine (NORVASC) tablet 5 mg  5 mg Oral Daily Chilton Si, MD      . apixaban Everlene Balls) tablet 2.5 mg  2.5 mg Oral BID Eduard Clos, MD   2.5 mg at 02/04/19 0818  . citalopram (CELEXA) tablet 30 mg  30 mg Oral Daily Eduard Clos, MD   30 mg at 02/04/19 0818  . docusate sodium (COLACE) capsule 100 mg  100 mg Oral QHS Eduard Clos, MD   100 mg at  02/03/19 2226  . [START ON 02/05/2019] furosemide (LASIX) tablet 20 mg  20 mg Oral Daily Chilton Si, MD      . galantamine (RAZADYNE ER) 24 hr capsule 8 mg  8 mg Oral Q breakfast Eduard Clos, MD   8 mg at 02/04/19 0817  . LORazepam (ATIVAN) tablet 0.5 mg  0.5 mg Oral BID Eduard Clos, MD   0.5 mg at 02/04/19 1409  . LORazepam (ATIVAN) tablet 0.5 mg  0.5 mg Oral Daily PRN Eduard Clos, MD   0.5 mg at 02/03/19 1831  . metoprolol tartrate (LOPRESSOR) tablet 75 mg  75 mg Oral BID Chilton Si, MD   75 mg at 02/04/19 1128  . multivitamin with minerals tablet 1 tablet  1 tablet Oral Daily Eduard Clos, MD   1 tablet at 02/04/19 0817  . ondansetron (ZOFRAN) tablet 4 mg  4 mg Oral Q6H PRN Eduard Clos, MD       Or  . ondansetron [facility]) injection 4 mg  4 mg Intravenous Q6H PRN Eduard Clos, MD      . polyethylene glycol (MIRALAX / GLYCOLAX) packet 17 g  17 g Oral Daily PRN Eduard Clos, MD      . polyvinyl alcohol (LIQUIFILM TEARS) 1.4 % ophthalmic solution 1 drop  1 drop Both Eyes Q1H PRN Eduard Clos, MD      . potassium chloride SA (KLOR-CON) CR tablet 20 mEq  20 mEq Oral Cristela Felt, MD   20 mEq at 02/03/19 3202  . psyllium (HYDROCIL/METAMUCIL) packet 1 packet  1 packet Oral BID Eduard Clos, MD   1 packet at 02/04/19 [phone]  . simvastatin (ZOCOR) tablet 10 mg  10 mg Oral q1800 Eduard Clos, MD   10 mg at 02/03/19 1720  . umeclidinium-vilanterol (ANORO ELLIPTA) 62.5-25 MCG/INH 1 puff  1 puff Inhalation Daily Eduard Clos, MD   1 puff at 02/04/19 0816  . ziprasidone (GEODON) capsule 60 mg  60 mg Oral QHS Eduard Clos, MD   60 mg at 02/03/19 2227     Discharge Medications: STOP taking these medications   Acetaminophen-Codeine 300-30 MG tablet Commonly known as: TYLENOL/CODEINE #3     TAKE these medications   acetaminophen 500 MG tablet Commonly known as: TYLENOL Take 1  tablet (500 mg total) by mouth 2 (two) times daily as needed for mild pain or moderate pain. What changed:   how much to take  when to take this   albuterol 108 (90 Base) MCG/ACT inhaler Commonly known as: VENTOLIN HFA Inhale 2 puffs into the lungs every 6 (six) hours as needed for wheezing or shortness of breath.   amLODipine 5 MG tablet Commonly known as: NORVASC Take 5 mg by mouth daily. What changed: Another medication with the same name was removed. Continue taking this medication, and follow the directions you  see here.   Anoro Ellipta 62.5-25 MCG/INH Aepb Generic drug: umeclidinium-vilanterol Inhale 1 puff into the lungs daily.   apixaban 2.5 MG Tabs tablet Commonly known as: ELIQUIS Take 1 tablet (2.5 mg total) by mouth 2 (two) times daily.   CALAZIME SKIN PROTECTANT EX Apply 1 application topically See admin instructions. Apply topically to affected area of coccyx and buttocks as needed for rash/irritation   CERTAVITE SENIOR/ANTIOXIDANT PO Take 1 tablet by mouth daily.   chlorhexidine 0.12 % solution Commonly known as: PERIDEX Use as directed 15 mLs in the mouth or throat 2 (two) times daily. Swish and spit   citalopram 10 MG tablet Commonly known as: CELEXA Take 30 mg by mouth daily.   CORICIDIN 2-325 MG Tabs Generic drug: Chlorpheniramine-APAP Take 1 tablet by mouth 2 (two) times daily as needed (congestion).   docusate sodium 100 MG capsule Commonly known as: COLACE Take 100 mg by mouth at bedtime.   furosemide 20 MG tablet Commonly known as: LASIX Take 1 tablet (20 mg total) by mouth daily. Start taking on: February 05, 2019   galantamine 8 MG 24 hr capsule Commonly known as: RAZADYNE ER Take 8 mg by mouth daily with breakfast.   LORazepam 0.5 MG tablet Commonly known as: ATIVAN Take 1 tablet (0.5 mg total) by mouth 2 (two) times daily. What changed:   how much to take  how to take this  when to take this  additional  instructions   METAMUCIL PO Take 5 mLs by mouth 2 (two) times daily. Mix in water and drink   Metoprolol Tartrate 75 MG Tabs Take 75 mg by mouth 2 (two) times daily. What changed:   medication strength  how much to take  when to take this  additional instructions   polyethylene glycol 17 g packet Commonly known as: MIRALAX / GLYCOLAX Take 17 g by mouth daily as needed (constipation). Mix in 4-8 oz liquid and drink   potassium chloride SA 20 MEQ tablet Commonly known as: KLOR-CON Take 20 mEq by mouth every other day.   simvastatin 10 MG tablet Commonly known as: ZOCOR Take 1 tablet (10 mg total) by mouth daily at 6 PM.   Systane 0.4-0.3 % Soln Generic drug: Polyethyl Glycol-Propyl Glycol Place 1 drop into both eyes every hour as needed (dry eyes/irritation).   ziprasidone 60 MG capsule Commonly known as: GEODON Take 60 mg by mouth at bedtime.     Relevant Imaging Results:  Relevant Lab Results:   Additional Information SS[address][id number] [place], [place]

## 2019-02-04 NOTE — Discharge Instructions (Signed)
Information on my medicine - ELIQUIS (apixaban)  This medication education was reviewed with me or my healthcare representative as part of my discharge preparation.  You were taking this medication prior to this hospital admission.   Why was Eliquis prescribed for you? Eliquis was prescribed for you to reduce the risk of a blood clot forming that can cause a stroke if you have a medical condition called atrial fibrillation (a type of irregular heartbeat).  What do You need to know about Eliquis ? Take your Eliquis TWICE DAILY - one tablet in the morning and one tablet in the evening with or without food. If you have difficulty swallowing the tablet whole please discuss with your pharmacist how to take the medication safely.  Take Eliquis exactly as prescribed by your doctor and DO NOT stop taking Eliquis without talking to the doctor who prescribed the medication.  Stopping may increase your risk of developing a stroke.  Refill your prescription before you run out.  After discharge, you should have regular check-up appointments with your healthcare provider that is prescribing your Eliquis.  In the future your dose may need to be changed if your kidney function or weight changes by a significant amount or as you get older.  What do you do if you miss a dose? If you miss a dose, take it as soon as you remember on the same day and resume taking twice daily.  Do not take more than one dose of ELIQUIS at the same time to make up a missed dose.  Important Safety Information A possible side effect of Eliquis is bleeding. You should call your healthcare provider right away if you experience any of the following: Bleeding from an injury or your nose that does not stop. Unusual colored urine (red or dark brown) or unusual colored stools (red or black). Unusual bruising for unknown reasons. A serious fall or if you hit your head (even if there is no bleeding).  Some medicines may interact with  Eliquis and might increase your risk of bleeding or clotting while on Eliquis. To help avoid this, consult your healthcare provider or pharmacist prior to using any new prescription or non-prescription medications, including herbals, vitamins, non-steroidal anti-inflammatory drugs (NSAIDs) and supplements.  This website has more information on Eliquis (apixaban): http://www.eliquis.com/eliquis/home  

## 2019-02-04 NOTE — Progress Notes (Signed)
PROGRESS NOTE  Yolanda Baker UEA:[id number] DOB: Jan 19, 1924 DOA: 02/02/2019 PCP: Crist Infante, MD  Brief History   The patient is a 85 yr old woman who presented to [facility] with complaints of shortness of breath. She carries a past medical history significant for anxiety, arthritis, depression, hypertension, atrial flutter following surgery, memory loss, "mini-strokes".  She was found to be in atrial fibrillation with RVR. Cardiology has been consulted. Rate was initially controlled with oral metoprolol. Rate has increased again in the last couple of hours. Cardiology has been consulted. Her heart rate has been controlled on metoprolol 75 mg bid. Echocardiogram was performed on 02/03/2019 that demonstrated EF of 50-55% with LV morphology that could be consistent with cardiac amyloidosis. Diastolic parameters are indeterminate. RV pressure is mildly elevated at 32.6 mmHg. Plan is for cardiology to evaluate her for amyloidosis as outpatient.   She will return to [facility] on discharge.  Consultants  . Cardiology  Procedures  . None  Antibiotics   Anti-infectives (From admission, onward)   None     Subjective  The patient is resting comfortably. No new complaints.  Objective   Vitals:  Vitals:   02/04/19 0623 02/04/19 1222  BP: (!) 145/101 101/76  Pulse: 91 78  Resp: 17 16  Temp: 98 F (36.7 C) 98.6 F (37 C)  SpO2: 100% 98%    Exam:  Constitutional:  . The patient is awake, alert, and oriented x 3. No acute distress. Respiratory:  . No increased work of breathing. . No wheezes, rales, or rhonchi . No tactile fremitus Cardiovascular:  . Regular rate and rhythm . No murmurs, ectopy, or gallups. . No lateral PMI. No thrills. Abdomen:  . Abdomen is soft, non-tender, non-distended . No hernias, masses, or organomegaly . Normoactive bowel sounds.  Musculoskeletal:  . No cyanosis, clubbing, or edema Skin:  . No rashes, lesions, ulcers . palpation of skin: no  induration or nodules Neurologic:  . CN 2-12 intact . Sensation all 4 extremities intact Psychiatric:  . Mental status o Mood, affect appropriate o Orientation to person, place, time  . judgment and insight appear intact  I have personally reviewed the following:   Today's Data  . Vitals  Imaging  . CTA chest  Cardiology Data  . EKG: AF with RVR . Echocardiogram: EF 50-55%, possible cardiac amyloidosis, indeterminate diastolic function, nl global right systolic function  Scheduled Meds: . [START ON 02/05/2019] amLODipine  5 mg Oral Daily  . apixaban  2.5 mg Oral BID  . citalopram  30 mg Oral Daily  . docusate sodium  100 mg Oral QHS  . [START ON 02/05/2019] furosemide  20 mg Oral Daily  . galantamine  8 mg Oral Q breakfast  . LORazepam  0.5 mg Oral BID  . metoprolol tartrate  75 mg Oral BID  . multivitamin with minerals  1 tablet Oral Daily  . potassium chloride SA  20 mEq Oral QODAY  . psyllium  1 packet Oral BID  . simvastatin  10 mg Oral q1800  . umeclidinium-vilanterol  1 puff Inhalation Daily  . ziprasidone  60 mg Oral QHS   Continuous Infusions:  Principal Problem:   Acute CHF (congestive heart failure) (HCC) Active Problems:   HTN (hypertension)   Dementia (HCC)   Schizoaffective disorder (HCC)   Atrial fibrillation with rapid ventricular response (HCC)   Acute on chronic diastolic heart failure (HCC)   LOS: 1 day   A & P  Acute CHF likely diastolic  precipitated by A. fib with RVR on Lasix 20 mg IV 1 dose was given will continue with Lasix 20 mg IV daily and follow intake output metabolic panel.  Last 2D echo done in 2016 showed EF of 55 to 60% with grade 3 diastolic dysfunction. Echocardiogram was performed on 02/03/2019 that demonstrated EF of 50-55% with LV morphology that could be consistent with cardiac amyloidosis. Diastolic parameters are indeterminate. RV pressure is mildly elevated at 32.6 mmHg. Will continue lasix 20 mg daily as per cardiology.   A. fib with RVR improved with IV metoprolol.  Now stable on oral metoprolol at 75 mg bid.   Hypertension on metoprolol and amlodipine.  Dementia on Razadyne.  Schizoaffective disorder on Geodon and Celexa and Ativan.  History of stroke on apixaban.  COPD not actively wheezing.  Continue inhalers.  I have seen and examined this patient myself. I have spent 30 minutes in her evaluation and care.  CODE STATUS: Partial Code (She wishes to be a full code except for intubation and mechanical ventilation). DVT Prophylaxis: Apixaban Family Communication: none available Dispostition: To return to [facility] [place].  Norine Reddington, DO [facility] Direct contact: see [email or web address]  7PM-7AM contact night coverage as above  02/04/2019, 4:39 PM  LOS: 0 days

## 2019-02-04 NOTE — Progress Notes (Signed)
Progress Note  Patient Name: Yolanda Baker Date of Encounter: 02/04/2019  Primary Cardiologist:Dr. Duke Salvia  Subjective   Breathing improved  Inpatient Medications    Scheduled Meds:  apixaban  2.5 mg Oral BID   citalopram  30 mg Oral Daily   docusate sodium  100 mg Oral QHS   furosemide  20 mg Intravenous BID   galantamine  8 mg Oral Q breakfast   LORazepam  0.5 mg Oral BID   losartan  25 mg Oral Daily   metoprolol tartrate  50 mg Oral Daily   And   metoprolol tartrate  25 mg Oral QHS   multivitamin with minerals  1 tablet Oral Daily   potassium chloride SA  20 mEq Oral QODAY   psyllium  1 packet Oral BID   simvastatin  10 mg Oral q1800   umeclidinium-vilanterol  1 puff Inhalation Daily   ziprasidone  60 mg Oral QHS   Continuous Infusions:  PRN Meds: acetaminophen **OR** acetaminophen, albuterol, LORazepam, ondansetron **OR** ondansetron (ZOFRAN) IV, polyethylene glycol, polyvinyl alcohol   Vital Signs    Vitals:   02/03/19 1106 02/03/19 1213 02/03/19 2232 02/04/19 0623  BP: (!) 135/94 (!) 134/93 (!) 141/108 (!) 145/101  Pulse: (!) 120 (!) 119 92 91  Resp: 16 18 16 17   Temp:  98.7 F (37.1 C) 98.1 F (36.7 C) 98 F (36.7 C)  TempSrc:  Oral Oral Oral  SpO2: 98% 98% 100% 100%  Weight:    42.6 kg    Intake/Output Summary (Last 24 hours) at 02/04/2019 0825 Last data filed at 02/04/2019 1423 Gross per 24 hour  Intake --  Output 1100 ml  Net -1100 ml   Last 3 Weights 02/04/2019 06/13/2017 07/03/2016  Weight (lbs) 93 lb 14.7 oz 101 lb 12.8 oz 106 lb  Weight (kg) 42.6 kg 46.176 kg 48.081 kg      Telemetry    afib at rate of 90-100s - Personally Reviewed  ECG    No new tracing   Physical Exam   GEN: thin frail elderly female in no acute distress.   Neck: No JVD Cardiac: Ir IR tachycardia, no murmurs, rubs, or gallops.  Respiratory: Clear to auscultation bilaterally. GI: Soft, nontender, non-distended  MS: No edema; No  deformity. Neuro:  Nonfocal  Psych: Normal affect   Labs    High Sensitivity Troponin:   Recent Labs  Lab 02/02/19 2050 02/02/19 2219 02/03/19 0841  TROPONINIHS 9 12 14       Chemistry Recent Labs  Lab 02/02/19 2050 02/03/19 0843  NA 136 137  K 4.1 3.6  CL 100 98  CO2 26 27  GLUCOSE 113* 101*  BUN 19 13  CREATININE 0.84 0.76  CALCIUM 9.0 8.6*  PROT 7.6 7.3  ALBUMIN 3.4* 3.4*  AST 31 35  ALT 15 11  ALKPHOS 87 73  BILITOT 0.4 1.1  GFRNONAA 60* >60  GFRAA >60 >60  ANIONGAP 10 12     Hematology Recent Labs  Lab 02/02/19 2050 02/03/19 0843  WBC 7.6 6.9  RBC 3.89 4.04  HGB 12.9 13.4  HCT 40.3 41.7  MCV 103.6* 103.2*  MCH 33.2 33.2  MCHC 32.0 32.1  RDW 14.0 13.8  PLT 234 225    BNP Recent Labs  Lab 02/02/19 2200  BNP 335.0*     DDimer  Recent Labs  Lab 02/03/19 0843  DDIMER 1.50*     Radiology    Ct Angio Chest Pe W Or Wo Contrast  Result Date: 02/03/2019 CLINICAL DATA:  Shortness of breath. EXAM: CT ANGIOGRAPHY CHEST WITH CONTRAST TECHNIQUE: Multidetector CT imaging of the chest was performed using the standard protocol during bolus administration of intravenous contrast. Multiplanar CT image reconstructions and MIPs were obtained to evaluate the vascular anatomy. CONTRAST:  78mL OMNIPAQUE IOHEXOL 350 MG/ML SOLN COMPARISON:  Chest x-ray dated 02/02/2019 FINDINGS: Cardiovascular: Satisfactory opacification of the pulmonary arteries to the segmental level. No evidence of pulmonary embolism. Cardiomegaly. Aortic atherosclerosis. RV LV ratio is normal. No pericardial effusion. Mediastinum/Nodes: No enlarged mediastinal, hilar, or axillary lymph nodes. Thyroid gland, trachea, and esophagus demonstrate no significant findings. Lungs/Pleura: Small right pleural effusion. No infiltrates. No mass lesions. Pulmonary vascularity is normal. Upper Abdomen: No acute abnormality. Musculoskeletal: No chest wall abnormality. No acute or significant osseous findings.  Review of the MIP images confirms the above findings. IMPRESSION: 1. No pulmonary emboli. 2. Cardiomegaly. 3. Small right pleural effusion. 4. Aortic Atherosclerosis (ICD10-I70.0). Electronically Signed   By: Francene Boyers M.D.   On: 02/03/2019 13:52   Dg Chest Port 1 View  Result Date: 02/02/2019 CLINICAL DATA:  Shortness of breath and AFib for 1 day, history of pneumonia and hypertension, nonsmoker EXAM: PORTABLE CHEST 1 VIEW COMPARISON:  Radiograph 06/04/2013 FINDINGS: Hazy interstitial opacity. There is a small right pleural effusion. No pneumothorax or left effusion is seen. No consolidative opacity is present. Cardiomegaly, slightly more pronounced than on comparison portable radiographs. Atherosclerotic plaque in the aorta. Degenerative changes are present in the imaged spine and shoulders. No acute osseous or soft tissue abnormality. IMPRESSION: 1. Hazy interstitial opacity with a small right pleural effusion and increasing cardiomegaly. Findings could reflect CHF with mild edema. 2.  Aortic Atherosclerosis (ICD10-I70.0). Electronically Signed   By: Kreg Shropshire M.D.   On: 02/02/2019 20:57    Cardiac Studies   Echo 02/03/2019  1. Left ventricular ejection fraction, by visual estimation, is 50 to 55%. The left ventricle has low normal function. There is moderately increased left ventricular hypertrophy. The LV morphology could be consistent with cardiac amyloidosis.  2. Left ventricular diastolic parameters are indeterminate.  3. The left ventricle has no regional wall motion abnormalities.  4. Global right ventricle has normal systolic function.The right ventricular size is normal. No increase in right ventricular wall thickness.  5. Left atrial size was mildly dilated.  6. Right atrial size was mildly dilated.  7. Trivial pericardial effusion is present.  8. The mitral valve is normal in structure. Mild mitral valve regurgitation. No evidence of mitral stenosis.  9. The tricuspid valve is  normal in structure. Tricuspid valve regurgitation is trivial. 10. The aortic valve is tricuspid. Aortic valve regurgitation is not visualized. No evidence of aortic valve sclerosis or stenosis. 11. The tricuspid regurgitant velocity is 2.48 m/s, and with an assumed right atrial pressure of 8 mmHg, the estimated right ventricular systolic pressure is mildly elevated at 32.6 mmHg. 12. The inferior vena cava is normal in size with <50% respiratory variability, suggesting right atrial pressure of 8 mmHg.   Patient Profile:   Yolanda Baker is a 86 y.o. female with a hx of paroxysmal atrial fibrillation/flutter, hypertension, stroke (in the setting of avoiding anticoagulation for frequent falls, now on low dose Eliquis) and dementia here with afib RVR  Assessment & Plan    1. Atrial fibrillation with RVR - TSH minimally up. Echo showed LVEF of low normal at 50-55%, moderate DD. The LV morphology could be consistent with cardiac amyloidosis. - Per note,  Plan to increase lopressor however remained on home dose at 50mg  AM and 25mg  PM. HR improved to 90-100s. Will review with MD. Likely 50mg  BID. Amlodipine changed to Losartan.   2. Acute diastolic CHF - BNP 542. CTA of chest without PE but showed R pleural effusion. Does not look significant volume overloaded. On IV lasix BID, likely change to PO.   3. HTN - Relatively stable  For questions or updates, please contact [place] Please consult [email or web address] for contact info under        SignedLeanor Kail, PA  02/04/2019, 8:25 AM

## 2019-02-04 NOTE — Discharge Summary (Signed)
Physician Discharge Summary  Yolanda Baker FXO:[id number] DOB: 1923/06/27 DOA: 02/02/2019  PCP: Rodrigo Ran, MD  Admit date: 02/02/2019 Discharge date: 02/04/2019  Recommendations for Outpatient Follow-up:  1. Discharge to home with home health for vitals/med checks 2. Follow up with Cardiology in 1 month for follow up on atrial fibrillation and possible amyloidosis. 3. Follow up with PCP in 7-10 days. 4. Check chemistry on 02/09/2019 and report to PCP.  Follow-up Information    Molli Hazard Thomasene Ripple, NP. Go on 02/16/2019.   Specialty: Cardiology Why: @11am  for hospital follow up with Dr. Leonides Sake PA/NP Contact information: [address] [place] [address] [phone]          Discharge Diagnoses: Principal diagnosis is #1 1. Atrial fibrillation with RVR 2. Acute CHF exacerbation 3. Possible amyloidosis on echocardiogram 4. Hypertension 5. Dementia 6. Schizoaffective disorder   Discharge Condition: Fair  Disposition: Return to [name]  Diet recommendation: Heart healthy  Filed Weights   02/04/19 0623  Weight: 42.6 kg   History of present illness:  Yolanda Baker is a 85 y.o. female with history of A. fib hypertension, stroke, dementia was brought to the ER after patient became acutely short of breath after supper last evening.  Patient did not have any chest pain fever chills or productive cough.  Has been experiencing palpitations last 24 hours.  ED Course: In the ER patient was found to be in A. fib with RVR and chest x-ray shows pleural effusion and which is concerning for CHF.  Labs show BNP of 335 high-sensitivity troponin of 9 creatinine 1.8 hemoglobin 12.9 platelets 234.  Patient's heart rate improved with 2 doses of IV metoprolol and was given Lasix 20 mg IV.  Patient admitted for further management of acute CHF with A. fib with RVR.  COVID-19 test was negative.  Hospital Course:  The patient was admitted to a telemetry bed.  Cardiology was consulted.   Today's assessment: S: The patient is resting comfortably. No new complaints. O: Vitals:  Vitals:   02/04/19 0623 02/04/19 1222  BP: (!) 145/101 101/76  Pulse: 91 78  Resp: 17 16  Temp: 98 F (36.7 C) 98.6 F (37 C)  SpO2: 100% 98%    Constitutional:   The patient is awake, alert, and oriented x 3. No acute distress. Respiratory:   No increased work of breathing.  No wheezes, rales, or rhonchi  No tactile fremitus Cardiovascular:   Regular rate and rhythm  No murmurs, ectopy, or gallups.  No lateral PMI. No thrills. Abdomen:   Abdomen is soft, non-tender, non-distended  No hernias, masses, or organomegaly  Normoactive bowel sounds.  Musculoskeletal:   No cyanosis, clubbing, or edema Skin:   No rashes, lesions, ulcers  palpation of skin: no induration or nodules Neurologic:   CN 2-12 intact  Sensation all 4 extremities intact Psychiatric:   Mental status ? Mood, affect appropriate ? Orientation to person, place, time   judgment and insight appear intact   Discharge Instructions  Discharge Instructions    Activity as tolerated - No restrictions   Complete by: As directed    Call MD for:  difficulty breathing, headache or visual disturbances   Complete by: As directed    Call MD for:  extreme fatigue   Complete by: As directed    Diet - low sodium heart healthy   Complete by: As directed    Discharge instructions   Complete by: As directed  Discharge to home with home health for vitals/med checks Follow up with Cardiology in 1 month for follow up on atrial fibrillation and possible amyloidosis. Follow up with PCP in 7-10 days. Check chemistry on 02/09/2019 and report to PCP.   Increase activity slowly   Complete by: As directed      Allergies as of 02/04/2019      Reactions   Flagyl [metronidazole]    Fingers get "numb"      Medication List    STOP taking these medications   Acetaminophen-Codeine  300-30 MG tablet Commonly known as: TYLENOL/CODEINE #3     TAKE these medications   acetaminophen 500 MG tablet Commonly known as: TYLENOL Take 1 tablet (500 mg total) by mouth 2 (two) times daily as needed for mild pain or moderate pain. What changed:   how much to take  when to take this   albuterol 108 (90 Base) MCG/ACT inhaler Commonly known as: VENTOLIN HFA Inhale 2 puffs into the lungs every 6 (six) hours as needed for wheezing or shortness of breath.   amLODipine 5 MG tablet Commonly known as: NORVASC Take 5 mg by mouth daily. What changed: Another medication with the same name was removed. Continue taking this medication, and follow the directions you see here.   Anoro Ellipta 62.5-25 MCG/INH Aepb Generic drug: umeclidinium-vilanterol Inhale 1 puff into the lungs daily.   apixaban 2.5 MG Tabs tablet Commonly known as: ELIQUIS Take 1 tablet (2.5 mg total) by mouth 2 (two) times daily.   CALAZIME SKIN PROTECTANT EX Apply 1 application topically See admin instructions. Apply topically to affected area of coccyx and buttocks as needed for rash/irritation   CERTAVITE SENIOR/ANTIOXIDANT PO Take 1 tablet by mouth daily.   chlorhexidine 0.12 % solution Commonly known as: PERIDEX Use as directed 15 mLs in the mouth or throat 2 (two) times daily. Swish and spit   citalopram 10 MG tablet Commonly known as: CELEXA Take 30 mg by mouth daily.   CORICIDIN 2-325 MG Tabs Generic drug: Chlorpheniramine-APAP Take 1 tablet by mouth 2 (two) times daily as needed (congestion).   docusate sodium 100 MG capsule Commonly known as: COLACE Take 100 mg by mouth at bedtime.   furosemide 20 MG tablet Commonly known as: LASIX Take 1 tablet (20 mg total) by mouth daily. Start taking on: February 05, 2019   galantamine 8 MG 24 hr capsule Commonly known as: RAZADYNE ER Take 8 mg by mouth daily with breakfast.   LORazepam 0.5 MG tablet Commonly known as: ATIVAN Take 1 tablet (0.5  mg total) by mouth 2 (two) times daily. What changed:   how much to take  how to take this  when to take this  additional instructions   METAMUCIL PO Take 5 mLs by mouth 2 (two) times daily. Mix in water and drink   Metoprolol Tartrate 75 MG Tabs Take 75 mg by mouth 2 (two) times daily. What changed:   medication strength  how much to take  when to take this  additional instructions   polyethylene glycol 17 g packet Commonly known as: MIRALAX / GLYCOLAX Take 17 g by mouth daily as needed (constipation). Mix in 4-8 oz liquid and drink   potassium chloride SA 20 MEQ tablet Commonly known as: KLOR-CON Take 20 mEq by mouth every other day.   simvastatin 10 MG tablet Commonly known as: ZOCOR Take 1 tablet (10 mg total) by mouth daily at 6 PM.   Systane 0.4-0.3 % Soln Generic  drug: Polyethyl Glycol-Propyl Glycol Place 1 drop into both eyes every hour as needed (dry eyes/irritation).   ziprasidone 60 MG capsule Commonly known as: GEODON Take 60 mg by mouth at bedtime.      Allergies  Allergen Reactions  . Flagyl [Metronidazole]     Fingers get "numb"    The results of significant diagnostics from this hospitalization (including imaging, microbiology, ancillary and laboratory) are listed below for reference.    Significant Diagnostic Studies: Ct Angio Chest Pe W Or Wo Contrast  Result Date: 02/03/2019 CLINICAL DATA:  Shortness of breath. EXAM: CT ANGIOGRAPHY CHEST WITH CONTRAST TECHNIQUE: Multidetector CT imaging of the chest was performed using the standard protocol during bolus administration of intravenous contrast. Multiplanar CT image reconstructions and MIPs were obtained to evaluate the vascular anatomy. CONTRAST:  80mL OMNIPAQUE IOHEXOL 350 MG/ML SOLN COMPARISON:  Chest x-ray dated 02/02/2019 FINDINGS: Cardiovascular: Satisfactory opacification of the pulmonary arteries to the segmental level. No evidence of pulmonary embolism. Cardiomegaly. Aortic  atherosclerosis. RV LV ratio is normal. No pericardial effusion. Mediastinum/Nodes: No enlarged mediastinal, hilar, or axillary lymph nodes. Thyroid gland, trachea, and esophagus demonstrate no significant findings. Lungs/Pleura: Small right pleural effusion. No infiltrates. No mass lesions. Pulmonary vascularity is normal. Upper Abdomen: No acute abnormality. Musculoskeletal: No chest wall abnormality. No acute or significant osseous findings. Review of the MIP images confirms the above findings. IMPRESSION: 1. No pulmonary emboli. 2. Cardiomegaly. 3. Small right pleural effusion. 4. Aortic Atherosclerosis (ICD10-I70.0). Electronically Signed   By: Francene BoyersJames  Maxwell M.D.   On: 02/03/2019 13:52   Dg Chest Port 1 View  Result Date: 02/02/2019 CLINICAL DATA:  Shortness of breath and AFib for 1 day, history of pneumonia and hypertension, nonsmoker EXAM: PORTABLE CHEST 1 VIEW COMPARISON:  Radiograph 06/04/2013 FINDINGS: Hazy interstitial opacity. There is a small right pleural effusion. No pneumothorax or left effusion is seen. No consolidative opacity is present. Cardiomegaly, slightly more pronounced than on comparison portable radiographs. Atherosclerotic plaque in the aorta. Degenerative changes are present in the imaged spine and shoulders. No acute osseous or soft tissue abnormality. IMPRESSION: 1. Hazy interstitial opacity with a small right pleural effusion and increasing cardiomegaly. Findings could reflect CHF with mild edema. 2.  Aortic Atherosclerosis (ICD10-I70.0). Electronically Signed   By: Kreg ShropshirePrice  DeHay M.D.   On: 02/02/2019 20:57    Microbiology: Recent Results (from the past 240 hour(s))  SARS CORONAVIRUS 2 (TAT 6-24 HRS) Nasopharyngeal Nasopharyngeal Swab     Status: None   Collection Time: 02/02/19 11:15 PM   Specimen: Nasopharyngeal Swab  Result Value Ref Range Status   SARS Coronavirus 2 NEGATIVE NEGATIVE Final    Comment: (NOTE) SARS-CoV-2 target nucleic acids are NOT DETECTED. The  SARS-CoV-2 RNA is generally detectable in upper and lower respiratory specimens during the acute phase of infection. Negative results do not preclude SARS-CoV-2 infection, do not rule out co-infections with other pathogens, and should not be used as the sole basis for treatment or other patient management decisions. Negative results must be combined with clinical observations, patient history, and epidemiological information. The expected result is Negative. Fact Sheet for Patients: HairSlick.nohttps://www.fda.gov/media/138098/download Fact Sheet for Healthcare Providers: quierodirigir.comhttps://www.fda.gov/media/138095/download This test is not yet approved or cleared by the [place] FDA and  has been authorized for detection and/or diagnosis of SARS-CoV-2 by FDA under an Emergency Use Authorization (EUA). This EUA will remain  in effect (meaning this test can be used) for the duration of the COVID-19 declaration under Section 56 4(b)(1) of the  Act, 21 U.S.C. section 360bbb-3(b)(1), unless the authorization is terminated or revoked sooner. Performed at [facility], [address] [place], [place] [address]      Labs: Basic Metabolic Panel: Recent Labs  Lab 02/02/19 2050 02/03/19 0843  NA 136 137  K 4.1 3.6  CL 100 98  CO2 26 27  GLUCOSE 113* 101*  BUN 19 13  CREATININE 0.84 0.76  CALCIUM 9.0 8.6*  MG  --  1.8   Liver Function Tests: Recent Labs  Lab 02/02/19 2050 02/03/19 0843  AST 31 35  ALT 15 11  ALKPHOS 87 73  BILITOT 0.4 1.1  PROT 7.6 7.3  ALBUMIN 3.4* 3.4*   No results for input(s): LIPASE, AMYLASE in the last 168 hours. No results for input(s): AMMONIA in the last 168 hours. CBC: Recent Labs  Lab 02/02/19 2050 02/03/19 0843  WBC 7.6 6.9  NEUTROABS 4.7 3.7  HGB 12.9 13.4  HCT 40.3 41.7  MCV 103.6* 103.2*  PLT 234 225   Cardiac Enzymes: No results for input(s): CKTOTAL, CKMB, CKMBINDEX, TROPONINI in the last 168 hours. BNP: BNP (last 3 results) Recent  Labs    02/02/19 2200  BNP 335.0*    ProBNP (last 3 results) No results for input(s): PROBNP in the last 8760 hours.  CBG: No results for input(s): GLUCAP in the last 168 hours.  Principal Problem:   Acute CHF (congestive heart failure) (HCC) Active Problems:   HTN (hypertension)   Dementia (HCC)   Schizoaffective disorder (HCC)   Atrial fibrillation with rapid ventricular response (HCC)   Acute on chronic diastolic heart failure (HCC)   Time coordinating discharge: 38 minutes.  Signed:        Ioan Landini, DO [facility]  02/04/2019, 4:24 PM

## 2019-02-04 NOTE — TOC Progression Note (Signed)
Transition of Care [facility]) - Progression Note    Patient Details  Name: Yolanda Baker MRN: [id number] Date of Birth: 1923-04-21  Transition of Care [facility]) CM/SW [place], [place] Phone Number: 02/04/2019, 4:34 PM  Clinical Narrative:    Acknowledging d/c order placed at 4:23pm. Spoke with MD, both CSW and facility need time to prepare fl2 with medications and review both that and discharge summary to arrange medications for pt to return to ALF.   CSW has spoken with daughter she is aware and will transport pt tomorrow back to ALF. CSW left another message with Imane the admissions director for fax number to send all needed clinicals.    Expected Discharge Plan: Assisted Living Barriers to Discharge: Continued Medical Work up  Expected Discharge Plan and Services Expected Discharge Plan: Assisted Living In-house Referral: Clinical Social Work Discharge Planning Services: CM Consult Post Acute Care Choice: Resumption of Svcs/PTA Provider Living arrangements for the past 2 months: [place], [place] Expected Discharge Date: 02/04/19      Readmission Risk Interventions Readmission Risk Prevention Plan 02/04/2019  Transportation Screening Complete  PCP or Specialist Appt within 5-7 Days Complete  Home Care Screening Complete  Medication Review (RN CM) Complete  Some recent data might be hidden

## 2019-02-04 NOTE — TOC Initial Note (Signed)
Transition of Care [facility]) - Initial/Assessment Note    Patient Details  Name: Yolanda Baker MRN: [id number] Date of Birth: 1923/06/11  Transition of Care [facility]) CM/SW Contact:    [place], [place] Phone Number: 02/04/2019, 11:49 AM  Clinical Narrative:                 CSW spoke with pt daughter Yolanda Baker via telephone. Introduced self, role, reason for call. Pt daughter confirms pt resides in an Assisted Living apartment in [place]. Pt usually ambulates with a walker around her apartment but utilizes a wheelchair when she needs to go further. Pt daughter confirms PCP and states pt has an appointment already scheduled for next week.   We discussed contacted Arlina Robes, Johnnette Barrios is the resident care coordinator. CSW shared that per notes pt has been cleared by cardiology- pt daughter with questions about f/u with that team and CSW shared that it likely would be on her discharge instructions if there was an further recommendations from the [facility] team.   Pt daughter aware that it is up to hospitalist as to when pt is cleared for d/c but we would need to ensure [facility] [place] [facility] has what they need for her return. Pt daughter confirms desire for pt to return to familiar setting at [facility].   CSW has left secure message with Johnnette Barrios to discuss return to [facility] when ready.   Expected Discharge Plan: Assisted Living Barriers to Discharge: Continued Medical Work up   Patient Goals and CMS Choice Patient states their goals for this hospitalization and ongoing recovery are:: for her to return to [facility] CMS Medicare.gov Compare Post Acute Care list provided to:: (n/a) Choice offered to / list presented to : NA  Expected Discharge Plan and Services Expected Discharge Plan: Assisted Living In-house Referral: Clinical Social Work Discharge Planning Services: CM Consult Post Acute Care Choice: Resumption of Svcs/PTA  Provider Living arrangements for the past 2 months: Apartment, [place]  Prior Living Arrangements/Services Living arrangements for the past 2 months: [place], [place] Lives with:: Self, Facility Resident Patient language and need for interpreter reviewed:: Yes(no needs) Do you feel safe going back to the place where you live?: Yes      Need for Family Participation in Patient Care: Yes (Comment)(assistance with ADL/IADLs; supervision) Care giver support system in place?: Yes (comment)(facility staff; pt adult children) Current home services: DME Criminal Activity/Legal Involvement Pertinent to Current Situation/Hospitalization: No - Comment as needed  Permission Sought/Granted Permission sought to share information with : Facility Sport and exercise psychologist, Family Supports Permission granted to share information with : No(pt with some fluctuating orientation)  Share Information with NAME: Adult nurse  Permission granted to share info w AGENCY: Venda Rodes at [employer] granted to share info w Relationship: daughter  Permission granted to share info w Contact Information: [phone]  Emotional Assessment Appearance:: Other (Comment Required(telephonic assessment completed with pt daughter Yolanda Baker) Attitude/Demeanor/Rapport: (telephonic assessment completed with pt daughter Yolanda Baker) Affect (typically observed): (telephonic assessment completed with pt daughter Yolanda Baker) Orientation: : Oriented to Self, Oriented to Place, Oriented to  Time, Fluctuating Orientation (Suspected and/or reported Sundowners) Alcohol / Substance Use: Not Applicable Psych Involvement: No (comment)  Admission diagnosis:  Atrial fibrillation with rapid ventricular response ([place]) [I48.91] Acute congestive heart failure, unspecified heart failure type (HCC) [I50.9] Acute CHF (congestive heart failure) ([place]) [I50.9] Patient Active Problem List   Diagnosis Date Noted  .  Acute on chronic diastolic heart failure (HCC)   . Acute CHF (congestive heart failure) (HCC) 02/03/2019  . Atrial fibrillation with rapid ventricular response (HCC) 02/03/2019  . GAD (generalized anxiety disorder) 01/15/2018  . Schizoaffective disorder (HCC) 01/15/2018  . Hyperlipidemia LDL goal <70 07/01/2014  . Constipation chronic 07/01/2014  . Acute CVA (cerebrovascular accident) (HCC)   . Acute blood loss anemia 10/12/2013  . Postoperative anemia due to acute blood loss 10/04/2013  . Postop Transfusion 10/04/2013  . Closed fracture of femur with nonunion 10/03/2013  . Femur fracture, right (HCC) 10/03/2013  . Closed femur fracture (HCC) 10/03/2013  . UTI (urinary tract infection) 06/23/2013  . Dementia (HCC) 06/09/2013  . Anxiety 06/09/2013  . Atrial flutter (HCC) 06/07/2013  . Hip fracture (HCC) 06/04/2013  . Depression 06/04/2013  . Anxiety state 06/04/2013  . HTN (hypertension) 06/04/2013  . Closed right hip fracture (HCC) 06/04/2013   PCP:  Rodrigo Ran, MD Pharmacy:  No Pharmacies Listed    Social Determinants of Health (SDOH) Interventions    Readmission Risk Interventions Readmission Risk Prevention Plan 02/04/2019  Transportation Screening Complete  PCP or Specialist Appt within 5-7 Days Complete  Home Care Screening Complete  Medication Review (RN CM) Complete  Some recent data might be hidden

## 2019-02-05 DIAGNOSIS — G20 Parkinson's disease: Secondary | ICD-10-CM

## 2019-02-05 DIAGNOSIS — Z66 Do not resuscitate: Secondary | ICD-10-CM

## 2019-02-05 MED ORDER — METOPROLOL TARTRATE 25 MG PO TABS
25.0000 mg | ORAL_TABLET | Freq: Once | ORAL | Status: AC
  Administered 2019-02-05: 12:00:00 25 mg via ORAL
  Filled 2019-02-05: qty 1

## 2019-02-05 MED ORDER — METOPROLOL TARTRATE 50 MG PO TABS
100.0000 mg | ORAL_TABLET | Freq: Two times a day (BID) | ORAL | Status: DC
  Administered 2019-02-05 – 2019-02-06 (×3): 100 mg via ORAL
  Filled 2019-02-05 (×4): qty 2

## 2019-02-05 NOTE — Progress Notes (Signed)
Progress Note  Patient Name: Yolanda Baker Date of Encounter: 02/05/2019  Primary Cardiologist: No primary care provider on file.   Subjective   Feeling well.  No chest pain, shortness of breath or palpitations.   Inpatient Medications    Scheduled Meds: . amLODipine  5 mg Oral Daily  . apixaban  2.5 mg Oral BID  . citalopram  30 mg Oral Daily  . docusate sodium  100 mg Oral QHS  . furosemide  20 mg Oral Daily  . galantamine  8 mg Oral Q breakfast  . LORazepam  0.5 mg Oral BID  . metoprolol tartrate  75 mg Oral BID  . multivitamin with minerals  1 tablet Oral Daily  . potassium chloride SA  20 mEq Oral QODAY  . psyllium  1 packet Oral BID  . simvastatin  10 mg Oral q1800  . umeclidinium-vilanterol  1 puff Inhalation Daily  . ziprasidone  60 mg Oral QHS   Continuous Infusions:  PRN Meds: acetaminophen **OR** acetaminophen, albuterol, LORazepam, ondansetron **OR** ondansetron (ZOFRAN) IV, polyethylene glycol, polyvinyl alcohol   Vital Signs    Vitals:   02/04/19 1222 02/04/19 1724 02/04/19 2336 02/05/19 0513  BP: 101/76 139/88 115/76 115/72  Pulse: 78 (!) 101 76 66  Resp: 16 16 17 17   Temp: 98.6 F (37 C) 97.6 F (36.4 C) 98.9 F (37.2 C) 98.9 F (37.2 C)  TempSrc: Oral Oral Oral Oral  SpO2: 98% 99% 97% 100%  Weight:    42.7 kg   No intake or output data in the 24 hours ending 02/05/19 0927 Last 3 Weights 02/05/2019 02/04/2019 06/13/2017  Weight (lbs) 94 lb 2.2 oz 93 lb 14.7 oz 101 lb 12.8 oz  Weight (kg) 42.7 kg 42.6 kg 46.176 kg      Telemetry    Atrial fibrillation.  Rate 90s-110s - Personally Reviewed  ECG    N/A - Personally Reviewed  Physical Exam   VS:  BP 115/72 (BP Location: Left Arm)   Pulse 66   Temp 98.9 F (37.2 C) (Oral)   Resp 17   Wt 42.7 kg   SpO2 100%   BMI 17.22 kg/m  , BMI Body mass index is 17.22 kg/m. GENERAL:  Frail, elderly woman. No acute distress HEENT: Pupils equal round and reactive, fundi not visualized, oral  mucosa unremarkable NECK:  No jugular venous distention, waveform within normal limits, carotid upstroke brisk and symmetric, no bruits LUNGS:  Clear to auscultation bilaterally HEART: irregularly irregular.  PMI not displaced or sustained,S1 and S2 within normal limits, no S3, no S4, no clicks, no rubs, no murmurs ABD:  Flat, positive bowel sounds normal in frequency in pitch, no bruits, no rebound, no guarding, no midline pulsatile mass, no hepatomegaly, no splenomegaly EXT:  2 plus pulses throughout, no edema, no cyanosis no clubbing SKIN:  No rashes no nodules NEURO:  Cranial nerves II through XII grossly intact, motor grossly intact throughout PSYCH:  Cognitively intact, oriented to person place and time   Labs    High Sensitivity Troponin:   Recent Labs  Lab 02/02/19 2050 02/02/19 2219 02/03/19 0841  TROPONINIHS 9 12 14       Chemistry Recent Labs  Lab 02/02/19 2050 02/03/19 0843  NA 136 137  K 4.1 3.6  CL 100 98  CO2 26 27  GLUCOSE 113* 101*  BUN 19 13  CREATININE 0.84 0.76  CALCIUM 9.0 8.6*  PROT 7.6 7.3  ALBUMIN 3.4* 3.4*  AST 31 35  ALT 15 11  ALKPHOS 87 73  BILITOT 0.4 1.1  GFRNONAA 60* >60  GFRAA >60 >60  ANIONGAP 10 12     Hematology Recent Labs  Lab 02/02/19 2050 02/03/19 0843  WBC 7.6 6.9  RBC 3.89 4.04  HGB 12.9 13.4  HCT 40.3 41.7  MCV 103.6* 103.2*  MCH 33.2 33.2  MCHC 32.0 32.1  RDW 14.0 13.8  PLT 234 225    BNP Recent Labs  Lab 02/02/19 2200  BNP 335.0*     DDimer  Recent Labs  Lab 02/03/19 0843  DDIMER 1.50*     Radiology    CT ANGIO CHEST PE W OR WO CONTRAST  Result Date: 02/03/2019 CLINICAL DATA:  Shortness of breath. EXAM: CT ANGIOGRAPHY CHEST WITH CONTRAST TECHNIQUE: Multidetector CT imaging of the chest was performed using the standard protocol during bolus administration of intravenous contrast. Multiplanar CT image reconstructions and MIPs were obtained to evaluate the vascular anatomy. CONTRAST:  42mL  OMNIPAQUE IOHEXOL 350 MG/ML SOLN COMPARISON:  Chest x-ray dated 02/02/2019 FINDINGS: Cardiovascular: Satisfactory opacification of the pulmonary arteries to the segmental level. No evidence of pulmonary embolism. Cardiomegaly. Aortic atherosclerosis. RV LV ratio is normal. No pericardial effusion. Mediastinum/Nodes: No enlarged mediastinal, hilar, or axillary lymph nodes. Thyroid gland, trachea, and esophagus demonstrate no significant findings. Lungs/Pleura: Small right pleural effusion. No infiltrates. No mass lesions. Pulmonary vascularity is normal. Upper Abdomen: No acute abnormality. Musculoskeletal: No chest wall abnormality. No acute or significant osseous findings. Review of the MIP images confirms the above findings. IMPRESSION: 1. No pulmonary emboli. 2. Cardiomegaly. 3. Small right pleural effusion. 4. Aortic Atherosclerosis (ICD10-I70.0). Electronically Signed   By: Francene Boyers M.D.   On: 02/03/2019 13:52   ECHOCARDIOGRAM COMPLETE  Result Date: 02/03/2019   ECHOCARDIOGRAM REPORT   Patient Name:   Yolanda Baker Date of Exam: 02/03/2019 Medical Rec #:  [id number]     Height:       62.0 in Accession #:    [id number]    Weight:       101.8 lb Date of Birth:  1924-02-20     BSA:          1.43 m Patient Age:    85 years      BP:           135/94 mmHg Patient Gender: F             HR:           95 bpm. Exam Location:  Inpatient Procedure: 2D Echo Indications:    428 CHF  History:        Patient has prior history of Echocardiogram examinations, most                 recent 06/30/2014. Stroke; Risk Factors:Hypertension. Dysrhythmia.  Sonographer:    Celene Skeen RDCS (AE) Referring Phys: [id number] Meryle Ready [facility]  Sonographer Comments: limited mobility. off axis apical windows IMPRESSIONS  1. Left ventricular ejection fraction, by visual estimation, is 50 to 55%. The left ventricle has low normal function. There is moderately increased left ventricular hypertrophy. The LV morphology could be consistent with  cardiac amyloidosis.  2. Left ventricular diastolic parameters are indeterminate.  3. The left ventricle has no regional wall motion abnormalities.  4. Global right ventricle has normal systolic function.The right ventricular size is normal. No increase in right ventricular wall thickness.  5. Left atrial size was mildly dilated.  6. Right atrial size was mildly dilated.  7. Trivial pericardial effusion is present.  8. The mitral valve is normal in structure. Mild mitral valve regurgitation. No evidence of mitral stenosis.  9. The tricuspid valve is normal in structure. Tricuspid valve regurgitation is trivial. 10. The aortic valve is tricuspid. Aortic valve regurgitation is not visualized. No evidence of aortic valve sclerosis or stenosis. 11. The tricuspid regurgitant velocity is 2.48 m/s, and with an assumed right atrial pressure of 8 mmHg, the estimated right ventricular systolic pressure is mildly elevated at 32.6 mmHg. 12. The inferior vena cava is normal in size with <50% respiratory variability, suggesting right atrial pressure of 8 mmHg. FINDINGS  Left Ventricle: Left ventricular ejection fraction, by visual estimation, is 50 to 55%. The left ventricle has low normal function. The left ventricle has no regional wall motion abnormalities. The left ventricular internal cavity size was the left ventricle is normal in size. There is moderately increased left ventricular hypertrophy. Left ventricular diastolic parameters are indeterminate. Right Ventricle: The right ventricular size is normal. No increase in right ventricular wall thickness. Global RV systolic function is has normal systolic function. The tricuspid regurgitant velocity is 2.48 m/s, and with an assumed right atrial pressure  of 8 mmHg, the estimated right ventricular systolic pressure is mildly elevated at 32.6 mmHg. Left Atrium: Left atrial size was mildly dilated. Right Atrium: Right atrial size was mildly dilated Pericardium: Trivial  pericardial effusion is present. Mitral Valve: The mitral valve is normal in structure. No evidence of mitral valve stenosis by observation. Mild mitral valve regurgitation. Tricuspid Valve: The tricuspid valve is normal in structure. Tricuspid valve regurgitation is trivial. Aortic Valve: The aortic valve is tricuspid. Aortic valve regurgitation is not visualized. The aortic valve is structurally normal, with no evidence of sclerosis or stenosis. Pulmonic Valve: The pulmonic valve was normal in structure. Pulmonic valve regurgitation is not visualized. Aorta: The aortic root is normal in size and structure. Venous: The inferior vena cava is normal in size with less than 50% respiratory variability, suggesting right atrial pressure of 8 mmHg. IAS/Shunts: No atrial level shunt detected by color flow Doppler.  LEFT VENTRICLE PLAX 2D LVIDd:         3.30 cm LVIDs:         2.90 cm LV PW:         1.00 cm LV IVS:        1.00 cm LVOT diam:     1.80 cm LV SV:         12 ml LV SV Index:   8.42 LVOT Area:     2.54 cm  LEFT ATRIUM           Index LA diam:      3.30 cm 2.30 cm/m LA Vol (A4C): 20.1 ml 14.01 ml/m  AORTIC VALVE LVOT Vmax:   49.60 cm/s LVOT Vmean:  36.500 cm/s LVOT VTI:    0.085 m  AORTA Ao Root diam: 2.80 cm MITRAL VALVE                       TRICUSPID VALVE MV Area (PHT): 2.95 cm            TR Peak grad:   24.6 mmHg MV PHT:        74.53 msec          TR Vmax:        248.00 cm/s MV Decel Time: 257 msec MV E velocity: 88.70 cm/s 103 cm/s SHUNTS  Systemic VTI:  0.09 m                                    Systemic Diam: 1.80 cm  Marca Ancona MD Electronically signed by Marca Ancona MD Signature Date/Time: 02/03/2019/4:03:44 PM    Final     Cardiac Studies   Echo 02/03/19: IMPRESSIONS    1. Left ventricular ejection fraction, by visual estimation, is 50 to 55%. The left ventricle has low normal function. There is moderately increased left ventricular hypertrophy. The LV  morphology could be consistent with cardiac amyloidosis.  2. Left ventricular diastolic parameters are indeterminate.  3. The left ventricle has no regional wall motion abnormalities.  4. Global right ventricle has normal systolic function.The right ventricular size is normal. No increase in right ventricular wall thickness.  5. Left atrial size was mildly dilated.  6. Right atrial size was mildly dilated.  7. Trivial pericardial effusion is present.  8. The mitral valve is normal in structure. Mild mitral valve regurgitation. No evidence of mitral stenosis.  9. The tricuspid valve is normal in structure. Tricuspid valve regurgitation is trivial. 10. The aortic valve is tricuspid. Aortic valve regurgitation is not visualized. No evidence of aortic valve sclerosis or stenosis. 11. The tricuspid regurgitant velocity is 2.48 m/s, and with an assumed right atrial pressure of 8 mmHg, the estimated right ventricular systolic pressure is mildly elevated at 32.6 mmHg. 12. The inferior vena cava is normal in size with <50% respiratory variability, suggesting right atrial pressure of 8 mmHg.  Patient Profile     Ms. Corsi is a 79F with PAF, hypertension, CVA, dementia and frequent falls admitted with atrial fibrillation with RVR  Assessment & Plan    # Atrial fibrillation with RVR: She has a history of afib and previously had a CVA in the setting of avoiding anticoagulation for frequent falls. Lately she has been taking Eliquis.  Rate still >100 at rest at times.  Increase metoprolol to 100mg  bid.  # Acute on chronic diastolic heart failure: Breathing improved with minimal diuresis.  Would check BMP at follow up 12/21.  There was concern for amyloidosis on echo.  Consider outpatient PYP scan.  # Essential hypertension:  BP well-controlled on amlodipine and metoprolol.  # Hyperlipidemia:  Continue simvastatin.       For questions or updates, please contact [facility] Please consult  [email or web address] for contact info under        Signed, Chilton Si, MD  02/05/2019, 9:27 AM

## 2019-02-05 NOTE — TOC Transition Note (Signed)
Transition of Care [facility]) - CM/SW Discharge Note   Patient Details  Name: Yolanda Baker MRN: [id number] Date of Birth: 04-15-1923  Transition of Care [facility]) CM/SW Contact:  Alexander Mt, [place] Phone Number: 02/05/2019, 10:46 AM   Clinical Narrative:    CSW spoke with pt daughter via telephone. She is aware pt stable for discharge. Pt paperwork faxed to [facility].  Pt daughter will come pick pt up in her car at 12:30pm- she requested CSW let pt RN know that pt necklace is home with her if pt asks.   Pt RN Jana Half aware and will call report to [facility].    Final next level of care: Assisted Living Barriers to Discharge: Barriers Resolved   Patient Goals and CMS Choice Patient states their goals for this hospitalization and ongoing recovery are:: for her to return to [facility] CMS Medicare.gov Compare Post Acute Care list provided to:: (n/a) Choice offered to / list presented to : NA  Discharge Placement       Patient chooses bed at: [facility] Patient to be transferred to facility by: pt daughter car Name of family member notified: [name] via telephone Patient and family notified of of transfer: 02/05/19  Discharge Plan and Services In-house Referral: Clinical Social Work Discharge Planning Services: CM Consult Post Acute Care Choice: Resumption of Svcs/PTA Provider           Readmission Risk Interventions Readmission Risk Prevention Plan 02/04/2019  Transportation Screening Complete  PCP or Specialist Appt within 5-7 Days Complete  Home Care Screening Complete  Medication Review (RN CM) Complete  Some recent data might be hidden

## 2019-02-05 NOTE — NC FL2 (Signed)
[place] MEDICAID FL2 LEVEL OF CARE SCREENING TOOL     IDENTIFICATION  Patient Name: Yolanda Baker Birthdate: [birth year] Sex: female Admission Date (Current Location): 02/02/2019  [facility] and [place] Number:  Herbalist and Address:  [facility], [place] [address] [place], [place] [address]      Provider Number: [id number]  Attending Physician Name and Address:  Karie Kirks, DO  Relative Name and Phone Number:       Current Level of Care: Hospital Recommended Level of Care: [place] Prior Approval Number:    Date Approved/Denied:   PASRR Number: [id number]  Discharge Plan: SNF    Current Diagnoses: Patient Active Problem List   Diagnosis Date Noted  . DNR (do not resuscitate) present on admission 02/04/2019  . Acute on chronic diastolic heart failure ([place])   . Acute CHF (congestive heart failure) ([place]) 02/03/2019  . Atrial fibrillation with rapid ventricular response ([place]) 02/03/2019  . GAD (generalized anxiety disorder) 01/15/2018  . Schizoaffective disorder ([place]) 01/15/2018  . Hyperlipidemia LDL goal <70 07/01/2014  . Constipation chronic 07/01/2014  . Acute CVA (cerebrovascular accident) ([place])   . Acute blood loss anemia 10/12/2013  . Postoperative anemia due to acute blood loss 10/04/2013  . Postop Transfusion 10/04/2013  . Closed fracture of femur with nonunion 10/03/2013  . Femur fracture, right ([place]) 10/03/2013  . Closed femur fracture ([place]) 10/03/2013  . UTI (urinary tract infection) 06/23/2013  . Dementia ([place]) 06/09/2013  . Anxiety 06/09/2013  . Atrial flutter ([place]) 06/07/2013  . Hip fracture ([place]) 06/04/2013  . Depression 06/04/2013  . Anxiety state 06/04/2013  . HTN (hypertension) 06/04/2013  . Closed right hip fracture ([place]) 06/04/2013    Orientation RESPIRATION BLADDER Height & Weight     Self, Place, Time  Normal Continent, External catheter Weight: 94 lb 2.2 oz (42.7 kg) Height:      BEHAVIORAL SYMPTOMS/MOOD NEUROLOGICAL BOWEL NUTRITION STATUS      Continent Diet(see discharge summary)  AMBULATORY STATUS COMMUNICATION OF NEEDS Skin   Extensive Assist Verbally Other (Comment)(dry; generalized ecchymosis)                       Personal Care Assistance Level of Assistance  Dressing, Feeding, Bathing Bathing Assistance: Maximum assistance Feeding assistance: Independent Dressing Assistance: Maximum assistance     Functional Limitations Info  Hearing, Speech, Sight Sight Info: Adequate Hearing Info: Adequate Speech Info: Adequate    SPECIAL CARE FACTORS FREQUENCY  OT (By licensed OT), PT (By licensed PT)     PT Frequency: 5x week OT Frequency: 5x week            Contractures Contractures Info: Not present    Additional Factors Info  Code Status, Allergies, Psychotropic Code Status Info: Full Code Allergies Info: Flagyl (Metronidazole) Psychotropic Info: LORazepam (ATIVAN) tablet 0.5 mg daily PO; citalopram (CELEXA) tablet 30 mg daily PO; galantamine (RAZADYNE ER) 24 hr capsule 8 mg daily with breakfast PO; ziprasidone (GEODON) capsule 60 mg daily at bedtime PO         Current Medications (02/05/2019):  This is the current hospital active medication list Current Facility-Administered Medications  Medication Dose Route Frequency Provider Last Rate Last Admin  . acetaminophen (TYLENOL) tablet 650 mg  650 mg Oral Q6H PRN Rise Patience, MD       Or  . acetaminophen (TYLENOL) suppository 650 mg  650 mg Rectal Q6H PRN Rise Patience, MD      .  albuterol (PROVENTIL) (2.5 MG/3ML) 0.083% nebulizer solution 2.5 mg  2.5 mg Inhalation Q6H PRN Eduard Clos, MD      . amLODipine (NORVASC) tablet 5 mg  5 mg Oral Daily Chilton Si, MD   5 mg at 02/05/19 9242  . apixaban (ELIQUIS) tablet 2.5 mg  2.5 mg Oral BID Eduard Clos, MD   2.5 mg at 02/05/19 0853  . citalopram (CELEXA) tablet 30 mg  30 mg Oral Daily Eduard Clos,  MD   30 mg at 02/05/19 0851  . docusate sodium (COLACE) capsule 100 mg  100 mg Oral QHS Eduard Clos, MD   100 mg at 02/04/19 2113  . furosemide (LASIX) tablet 20 mg  20 mg Oral Daily Chilton Si, MD   20 mg at 02/05/19 6834  . galantamine (RAZADYNE ER) 24 hr capsule 8 mg  8 mg Oral Q breakfast Eduard Clos, MD   8 mg at 02/05/19 0854  . LORazepam (ATIVAN) tablet 0.5 mg  0.5 mg Oral BID Eduard Clos, MD   0.5 mg at 02/04/19 2113  . LORazepam (ATIVAN) tablet 0.5 mg  0.5 mg Oral Daily PRN Eduard Clos, MD   0.5 mg at 02/03/19 1831  . metoprolol tartrate (LOPRESSOR) tablet 100 mg  100 mg Oral BID Chilton Si, MD      . multivitamin with minerals tablet 1 tablet  1 tablet Oral Daily Eduard Clos, MD   1 tablet at 02/05/19 [phone]  . ondansetron (ZOFRAN) tablet 4 mg  4 mg Oral Q6H PRN Eduard Clos, MD       Or  . ondansetron [facility]) injection 4 mg  4 mg Intravenous Q6H PRN Eduard Clos, MD      . polyethylene glycol (MIRALAX / GLYCOLAX) packet 17 g  17 g Oral Daily PRN Eduard Clos, MD      . polyvinyl alcohol (LIQUIFILM TEARS) 1.4 % ophthalmic solution 1 drop  1 drop Both Eyes Q1H PRN Eduard Clos, MD      . potassium chloride SA (KLOR-CON) CR tablet 20 mEq  20 mEq Oral Cristela Felt, MD   20 mEq at 02/05/19 0853  . psyllium (HYDROCIL/METAMUCIL) packet 1 packet  1 packet Oral BID Eduard Clos, MD   1 packet at 02/05/19 [phone]  . simvastatin (ZOCOR) tablet 10 mg  10 mg Oral q1800 Eduard Clos, MD   10 mg at 02/04/19 1735  . umeclidinium-vilanterol (ANORO ELLIPTA) 62.5-25 MCG/INH 1 puff  1 puff Inhalation Daily Eduard Clos, MD   1 puff at 02/05/19 [phone]  . ziprasidone (GEODON) capsule 60 mg  60 mg Oral QHS Eduard Clos, MD   60 mg at 02/04/19 2117     Discharge Medications: Please see discharge summary for a list of discharge medications.  Relevant Imaging  Results:  Relevant Lab Results:   Additional Information SS[id number] [place], [place]

## 2019-02-05 NOTE — Progress Notes (Signed)
Patient supposed to be discharge back to her assisted living facility. However, patient requiring a lot of help with movement. Patient could not sit at the side of the bed or roll to side of bed very easy. Contacted Education officer, museum, doctor, daughter, and facility, whom said that she is weaker that baseline. Discharge delayed for OT and PT evaluation for safe discharge. Continue to monitor.

## 2019-02-05 NOTE — Plan of Care (Signed)

## 2019-02-05 NOTE — Discharge Summary (Signed)
Physician Discharge Summary  Yolanda Baker UEA:[id number] DOB: 1923-04-12 DOA: 02/02/2019  PCP: Rodrigo Ran, MD  Admit date: 02/02/2019 Discharge date: 02/05/2019  Recommendations for Outpatient Follow-up:  1. Discharge to home with home health for vitals/med checks 2. Follow up with Cardiology in 1 month for follow up on atrial fibrillation and possible amyloidosis. 3. Follow up with PCP in 7-10 days. 4. Check chemistry on 02/09/2019 and report to PCP. 5. PT/OT  Follow-up Information    Ronney Asters, NP. Go on 02/16/2019.   Specialty: Cardiology Why:  for hospital follow up with Dr. Leonides Sake PA/NP Contact information: [address] [place] [address] [phone]          Discharge Diagnoses: Principal diagnosis is #1 1. Atrial fibrillation with RVR 2. Acute CHF exacerbation 3. Possible amyloidosis on echocardiogram 4. Hypertension 5. Dementia 6. Schizoaffective disorder  7. Ambulatory dysfunction  Discharge Condition: Fair  Disposition: Return to [facility]  Diet recommendation: Heart healthy  Filed Weights   02/04/19 0623 02/05/19 0513  Weight: 42.6 kg 42.7 kg   History of present illness:  Yolanda Baker is a 85 y.o. female with history of A. fib hypertension, stroke, dementia was brought to the ER after patient became acutely short of breath after supper last evening.  Patient did not have any chest pain fever chills or productive cough.  Has been experiencing palpitations last 24 hours.  ED Course: In the ER patient was found to be in A. fib with RVR and chest x-ray shows pleural effusion and which is concerning for CHF.  Labs show BNP of 335 high-sensitivity troponin of 9 creatinine 1.8 hemoglobin 12.9 platelets 234.  Patient's heart rate improved with 2 doses of IV metoprolol and was given Lasix 20 mg IV.  Patient admitted for further management of acute CHF with A. fib with RVR.  COVID-19 test was  negative.  Hospital Course:  The patient was admitted to a telemetry bed. Cardiology was consulted.   Today's assessment: S: The patient is resting comfortably. No new complaints. O: Vitals:  Vitals:   02/04/19 2336 02/05/19 0513  BP: 115/76 115/72  Pulse: 76 66  Resp: 17 17  Temp: 98.9 F (37.2 C) 98.9 F (37.2 C)  SpO2: 97% 100%    Constitutional:   The patient is awake, alert, and oriented x 3. No acute distress. Respiratory:   No increased work of breathing.  No wheezes, rales, or rhonchi  No tactile fremitus Cardiovascular:   Regular rate and rhythm  No murmurs, ectopy, or gallups.  No lateral PMI. No thrills. Abdomen:   Abdomen is soft, non-tender, non-distended  No hernias, masses, or organomegaly  Normoactive bowel sounds.  Musculoskeletal:   No cyanosis, clubbing, or edema Skin:   No rashes, lesions, ulcers  palpation of skin: no induration or nodules Neurologic:   CN 2-12 intact  Sensation all 4 extremities intact Psychiatric:   Mental status ? Mood, affect appropriate ? Orientation to person, place, time   judgment and insight appear intact   Discharge Instructions  Discharge Instructions    Activity as tolerated - No restrictions   Complete by: As directed    Call MD for:  difficulty breathing, headache or visual disturbances   Complete by: As directed    Call MD for:  extreme fatigue   Complete by: As directed    Diet - low sodium heart healthy   Complete by: As directed    Discharge instructions  Complete by: As directed    Discharge to home with home health for vitals/med checks Follow up with Cardiology in 1 month for follow up on atrial fibrillation and possible amyloidosis. Follow up with PCP in 7-10 days. Check chemistry on 02/09/2019 and report to PCP.   Increase activity slowly   Complete by: As directed      Allergies as of 02/05/2019      Reactions   Flagyl [metronidazole]    Fingers get "numb"       Medication List    STOP taking these medications   Acetaminophen-Codeine 300-30 MG tablet Commonly known as: TYLENOL/CODEINE #3     TAKE these medications   acetaminophen 500 MG tablet Commonly known as: TYLENOL Take 1 tablet (500 mg total) by mouth 2 (two) times daily as needed for mild pain or moderate pain. What changed:   how much to take  when to take this   albuterol 108 (90 Base) MCG/ACT inhaler Commonly known as: VENTOLIN HFA Inhale 2 puffs into the lungs every 6 (six) hours as needed for wheezing or shortness of breath.   amLODipine 5 MG tablet Commonly known as: NORVASC Take 5 mg by mouth daily. What changed: Another medication with the same name was removed. Continue taking this medication, and follow the directions you see here.   Anoro Ellipta 62.5-25 MCG/INH Aepb Generic drug: umeclidinium-vilanterol Inhale 1 puff into the lungs daily.   apixaban 2.5 MG Tabs tablet Commonly known as: ELIQUIS Take 1 tablet (2.5 mg total) by mouth 2 (two) times daily.   CALAZIME SKIN PROTECTANT EX Apply 1 application topically See admin instructions. Apply topically to affected area of coccyx and buttocks as needed for rash/irritation   CERTAVITE SENIOR/ANTIOXIDANT PO Take 1 tablet by mouth daily.   chlorhexidine 0.12 % solution Commonly known as: PERIDEX Use as directed 15 mLs in the mouth or throat 2 (two) times daily. Swish and spit   citalopram 10 MG tablet Commonly known as: CELEXA Take 30 mg by mouth daily.   CORICIDIN 2-325 MG Tabs Generic drug: Chlorpheniramine-APAP Take 1 tablet by mouth 2 (two) times daily as needed (congestion).   docusate sodium 100 MG capsule Commonly known as: COLACE Take 100 mg by mouth at bedtime.   furosemide 20 MG tablet Commonly known as: LASIX Take 1 tablet (20 mg total) by mouth daily.   galantamine 8 MG 24 hr capsule Commonly known as: RAZADYNE ER Take 8 mg by mouth daily with breakfast.   LORazepam 0.5 MG  tablet Commonly known as: ATIVAN Take 1 tablet (0.5 mg total) by mouth 2 (two) times daily. What changed:   how much to take  how to take this  when to take this  additional instructions   METAMUCIL PO Take 5 mLs by mouth 2 (two) times daily. Mix in water and drink   Metoprolol Tartrate 75 MG Tabs Take 75 mg by mouth 2 (two) times daily. What changed:   medication strength  how much to take  when to take this  additional instructions   polyethylene glycol 17 g packet Commonly known as: MIRALAX / GLYCOLAX Take 17 g by mouth daily as needed (constipation). Mix in 4-8 oz liquid and drink   potassium chloride SA 20 MEQ tablet Commonly known as: KLOR-CON Take 20 mEq by mouth every other day.   simvastatin 10 MG tablet Commonly known as: ZOCOR Take 1 tablet (10 mg total) by mouth daily at 6 PM.   Systane 0.4-0.3 % Soln  Generic drug: Polyethyl Glycol-Propyl Glycol Place 1 drop into both eyes every hour as needed (dry eyes/irritation).   ziprasidone 60 MG capsule Commonly known as: GEODON Take 60 mg by mouth at bedtime.      Allergies  Allergen Reactions  . Flagyl [Metronidazole]     Fingers get "numb"    The results of significant diagnostics from this hospitalization (including imaging, microbiology, ancillary and laboratory) are listed below for reference.    Significant Diagnostic Studies: CT ANGIO CHEST PE W OR WO CONTRAST  Result Date: 02/03/2019 CLINICAL DATA:  Shortness of breath. EXAM: CT ANGIOGRAPHY CHEST WITH CONTRAST TECHNIQUE: Multidetector CT imaging of the chest was performed using the standard protocol during bolus administration of intravenous contrast. Multiplanar CT image reconstructions and MIPs were obtained to evaluate the vascular anatomy. CONTRAST:  80mL OMNIPAQUE IOHEXOL 350 MG/ML SOLN COMPARISON:  Chest x-ray dated 02/02/2019 FINDINGS: Cardiovascular: Satisfactory opacification of the pulmonary arteries to the segmental level. No evidence  of pulmonary embolism. Cardiomegaly. Aortic atherosclerosis. RV LV ratio is normal. No pericardial effusion. Mediastinum/Nodes: No enlarged mediastinal, hilar, or axillary lymph nodes. Thyroid gland, trachea, and esophagus demonstrate no significant findings. Lungs/Pleura: Small right pleural effusion. No infiltrates. No mass lesions. Pulmonary vascularity is normal. Upper Abdomen: No acute abnormality. Musculoskeletal: No chest wall abnormality. No acute or significant osseous findings. Review of the MIP images confirms the above findings. IMPRESSION: 1. No pulmonary emboli. 2. Cardiomegaly. 3. Small right pleural effusion. 4. Aortic Atherosclerosis (ICD10-I70.0). Electronically Signed   By: Francene BoyersJames  Maxwell M.D.   On: 02/03/2019 13:52   DG Chest Port 1 View  Result Date: 02/02/2019 CLINICAL DATA:  Shortness of breath and AFib for 1 day, history of pneumonia and hypertension, nonsmoker EXAM: PORTABLE CHEST 1 VIEW COMPARISON:  Radiograph 06/04/2013 FINDINGS: Hazy interstitial opacity. There is a small right pleural effusion. No pneumothorax or left effusion is seen. No consolidative opacity is present. Cardiomegaly, slightly more pronounced than on comparison portable radiographs. Atherosclerotic plaque in the aorta. Degenerative changes are present in the imaged spine and shoulders. No acute osseous or soft tissue abnormality. IMPRESSION: 1. Hazy interstitial opacity with a small right pleural effusion and increasing cardiomegaly. Findings could reflect CHF with mild edema. 2.  Aortic Atherosclerosis (ICD10-I70.0). Electronically Signed   By: Kreg ShropshirePrice  DeHay M.D.   On: 02/02/2019 20:57   ECHOCARDIOGRAM COMPLETE  Result Date: 02/03/2019   ECHOCARDIOGRAM REPORT   Patient Name:   Yolanda PhlegmARLENE S Purtee Date of Exam: 02/03/2019 Medical Rec #:  [id number]     Height:       62.0 in Accession #:    [id number]    Weight:       101.8 lb Date of Birth:  1923/04/21     BSA:          1.43 m Patient Age:    89 years      BP:            135/94 mmHg Patient Gender: F             HR:           95 bpm. Exam Location:  Inpatient Procedure: 2D Echo Indications:    428 CHF  History:        Patient has prior history of Echocardiogram examinations, most                 recent 06/30/2014. Stroke; Risk Factors:Hypertension. Dysrhythmia.  Sonographer:    Celene SkeenVijay Shankar RDCS (AE) Referring Phys: [phone] [facility]oy[facility]  N KAKRAKANDY  Sonographer Comments: limited mobility. off axis apical windows IMPRESSIONS  1. Left ventricular ejection fraction, by visual estimation, is 50 to 55%. The left ventricle has low normal function. There is moderately increased left ventricular hypertrophy. The LV morphology could be consistent with cardiac amyloidosis.  2. Left ventricular diastolic parameters are indeterminate.  3. The left ventricle has no regional wall motion abnormalities.  4. Global right ventricle has normal systolic function.The right ventricular size is normal. No increase in right ventricular wall thickness.  5. Left atrial size was mildly dilated.  6. Right atrial size was mildly dilated.  7. Trivial pericardial effusion is present.  8. The mitral valve is normal in structure. Mild mitral valve regurgitation. No evidence of mitral stenosis.  9. The tricuspid valve is normal in structure. Tricuspid valve regurgitation is trivial. 10. The aortic valve is tricuspid. Aortic valve regurgitation is not visualized. No evidence of aortic valve sclerosis or stenosis. 11. The tricuspid regurgitant velocity is 2.48 m/s, and with an assumed right atrial pressure of 8 mmHg, the estimated right ventricular systolic pressure is mildly elevated at 32.6 mmHg. 12. The inferior vena cava is normal in size with <50% respiratory variability, suggesting right atrial pressure of 8 mmHg. FINDINGS  Left Ventricle: Left ventricular ejection fraction, by visual estimation, is 50 to 55%. The left ventricle has low normal function. The left ventricle has no regional wall motion abnormalities. The  left ventricular internal cavity size was the left ventricle is normal in size. There is moderately increased left ventricular hypertrophy. Left ventricular diastolic parameters are indeterminate. Right Ventricle: The right ventricular size is normal. No increase in right ventricular wall thickness. Global RV systolic function is has normal systolic function. The tricuspid regurgitant velocity is 2.48 m/s, and with an assumed right atrial pressure  of 8 mmHg, the estimated right ventricular systolic pressure is mildly elevated at 32.6 mmHg. Left Atrium: Left atrial size was mildly dilated. Right Atrium: Right atrial size was mildly dilated Pericardium: Trivial pericardial effusion is present. Mitral Valve: The mitral valve is normal in structure. No evidence of mitral valve stenosis by observation. Mild mitral valve regurgitation. Tricuspid Valve: The tricuspid valve is normal in structure. Tricuspid valve regurgitation is trivial. Aortic Valve: The aortic valve is tricuspid. Aortic valve regurgitation is not visualized. The aortic valve is structurally normal, with no evidence of sclerosis or stenosis. Pulmonic Valve: The pulmonic valve was normal in structure. Pulmonic valve regurgitation is not visualized. Aorta: The aortic root is normal in size and structure. Venous: The inferior vena cava is normal in size with less than 50% respiratory variability, suggesting right atrial pressure of 8 mmHg. IAS/Shunts: No atrial level shunt detected by color flow Doppler.  LEFT VENTRICLE PLAX 2D LVIDd:         3.30 cm LVIDs:         2.90 cm LV PW:         1.00 cm LV IVS:        1.00 cm LVOT diam:     1.80 cm LV SV:         12 ml LV SV Index:   8.42 LVOT Area:     2.54 cm  LEFT ATRIUM           Index LA diam:      3.30 cm 2.30 cm/m LA Vol (A4C): 20.1 ml 14.01 ml/m  AORTIC VALVE LVOT Vmax:   49.60 cm/s LVOT Vmean:  36.500 cm/s LVOT VTI:  0.085 m  AORTA Ao Root diam: 2.80 cm MITRAL VALVE                       TRICUSPID  VALVE MV Area (PHT): 2.95 cm            TR Peak grad:   24.6 mmHg MV PHT:        74.53 msec          TR Vmax:        248.00 cm/s MV Decel Time: 257 msec MV E velocity: 88.70 cm/s 103 cm/s SHUNTS                                    Systemic VTI:  0.09 m                                    Systemic Diam: 1.80 cm  Marca Ancona MD Electronically signed by Marca Ancona MD Signature Date/Time: 02/03/2019/4:03:44 PM    Final     Microbiology: Recent Results (from the past 240 hour(s))  SARS CORONAVIRUS 2 (TAT 6-24 HRS) Nasopharyngeal Nasopharyngeal Swab     Status: None   Collection Time: 02/02/19 11:15 PM   Specimen: Nasopharyngeal Swab  Result Value Ref Range Status   SARS Coronavirus 2 NEGATIVE NEGATIVE Final    Comment: (NOTE) SARS-CoV-2 target nucleic acids are NOT DETECTED. The SARS-CoV-2 RNA is generally detectable in upper and lower respiratory specimens during the acute phase of infection. Negative results do not preclude SARS-CoV-2 infection, do not rule out co-infections with other pathogens, and should not be used as the sole basis for treatment or other patient management decisions. Negative results must be combined with clinical observations, patient history, and epidemiological information. The expected result is Negative. Fact Sheet for Patients: HairSlick.no Fact Sheet for Healthcare Providers: quierodirigir.com This test is not yet approved or cleared by the [place] FDA and  has been authorized for detection and/or diagnosis of SARS-CoV-2 by FDA under an Emergency Use Authorization (EUA). This EUA will remain  in effect (meaning this test can be used) for the duration of the COVID-19 declaration under Section 56 4(b)(1) of the Act, 21 U.S.C. section 360bbb-3(b)(1), unless the authorization is terminated or revoked sooner. Performed at [facility], [address] [place], [place] [address]       Labs: Basic Metabolic Panel: Recent Labs  Lab 02/02/19 2050 02/03/19 0843  NA 136 137  K 4.1 3.6  CL 100 98  CO2 26 27  GLUCOSE 113* 101*  BUN 19 13  CREATININE 0.84 0.76  CALCIUM 9.0 8.6*  MG  --  1.8   Liver Function Tests: Recent Labs  Lab 02/02/19 2050 02/03/19 0843  AST 31 35  ALT 15 11  ALKPHOS 87 73  BILITOT 0.4 1.1  PROT 7.6 7.3  ALBUMIN 3.4* 3.4*   No results for input(s): LIPASE, AMYLASE in the last 168 hours. No results for input(s): AMMONIA in the last 168 hours. CBC: Recent Labs  Lab 02/02/19 2050 02/03/19 0843  WBC 7.6 6.9  NEUTROABS 4.7 3.7  HGB 12.9 13.4  HCT 40.3 41.7  MCV 103.6* 103.2*  PLT 234 225   Cardiac Enzymes: No results for input(s): CKTOTAL, CKMB, CKMBINDEX, TROPONINI in the last 168 hours. BNP: BNP (last 3 results) Recent  Labs    02/02/19 2200  BNP 335.0*    ProBNP (last 3 results) No results for input(s): PROBNP in the last 8760 hours.  CBG: No results for input(s): GLUCAP in the last 168 hours.  Principal Problem:   Acute CHF (congestive heart failure) (HCC) Active Problems:   HTN (hypertension)   Dementia (HCC)   Schizoaffective disorder (HCC)   Atrial fibrillation with rapid ventricular response (HCC)   Acute on chronic diastolic heart failure (HCC)   DNR (do not resuscitate) present on admission   Time coordinating discharge: 38 minutes.  Signed:        Masey Scheiber, DO [facility]  02/05/2019, 4:42 PM

## 2019-02-05 NOTE — Social Work (Signed)
Clinical Social Worker facilitated patient discharge including contacting patient family and facility to confirm patient discharge plans.  Clinical information faxed to facility and family agreeable with plan. Pt daughter to transport pt to [facility].  RN to call [phone]  with report prior to discharge.  Clinical Social Worker will sign off for now as social work intervention is no longer needed. Please consult [place] again if new need arises.  Westley Hummer, MSW, [place] Social Worker [phone]

## 2019-02-05 NOTE — Consult Note (Addendum)
Neurology Consultation  Reason for Consult: Rigidity  Referring Physician: Dr. Benny Lennert  History is obtained from: Chart  HPI: Yolanda Baker is a 85 y.o. female with history of stroke, shortness of breath, pneumonia, memory loss, hypertension, dysrhythmia-atrial flutter after hip surgery, arthritis.  Patient was initially brought to the hospital secondary to shortness of breath.  While in the ED she was found to be in A. fib with RVR and chest x-ray showed pleural effusion concerning for CHF.  Physical Therapy today saw the patient.  They noted that the patient was significantly rigid during their exam.  They did contact the primary physician who then consulted neurology to follow-up on the rigidity.  Unfortunately when talking to the patient she is poorly oriented: she does not know the city, she is unaware the month and believes that she still at the facility that she came from.  Not much information could be obtained from the patient.   Chart review: She is followed by Dr. Leonie Man as an outpatient.  It is described in his notes that patient ambulates with a walker and at times in a wheelchair for long distances.  Patient also is noted in the review of systems to have tremors, walking difficulties and memory loss.   Past Medical History:  Diagnosis Date  . Anxiety   . Arthritis    neck  . Bruises easily   . Depression   . Dysrhythmia    hx A-FLUTTER after hip surg April 2015- take metoprolol  . Eczema   . Hip joint pain    RT  . Hypertension   . Memory loss   . Osteoporosis   . Pneumonia    august 2017  . Shortness of breath    with activity  . Stroke [facility])    "mini stroke" per MRI - no deficiets      Family History  Problem Relation Age of Onset  . Heart attack Father   . Hypertension Father   . Hypertension Sister   . Stroke Sister   . Hypertension Brother     Social History:   reports that she has never smoked. She has never used smokeless tobacco. She reports that she  does not drink alcohol or use drugs.  Medications  Current Facility-Administered Medications:  .  acetaminophen (TYLENOL) tablet 650 mg, 650 mg, Oral, Q6H PRN **OR** acetaminophen (TYLENOL) suppository 650 mg, 650 mg, Rectal, Q6H PRN, Rise Patience, MD .  albuterol (PROVENTIL) (2.5 MG/3ML) 0.083% nebulizer solution 2.5 mg, 2.5 mg, Inhalation, Q6H PRN, Rise Patience, MD .  amLODipine (NORVASC) tablet 5 mg, 5 mg, Oral, Daily, Skeet Latch, MD, 5 mg at 02/05/19 [phone] .  apixaban (ELIQUIS) tablet 2.5 mg, 2.5 mg, Oral, BID, Rise Patience, MD, 2.5 mg at 02/05/19 0853 .  citalopram (CELEXA) tablet 30 mg, 30 mg, Oral, Daily, Rise Patience, MD, 30 mg at 02/05/19 0851 .  docusate sodium (COLACE) capsule 100 mg, 100 mg, Oral, QHS, Rise Patience, MD, 100 mg at 02/04/19 2113 .  furosemide (LASIX) tablet 20 mg, 20 mg, Oral, Daily, Skeet Latch, MD, 20 mg at 02/05/19 5277 .  galantamine (RAZADYNE ER) 24 hr capsule 8 mg, 8 mg, Oral, Q breakfast, Rise Patience, MD, 8 mg at 02/05/19 0854 .  LORazepam (ATIVAN) tablet 0.5 mg, 0.5 mg, Oral, BID, Rise Patience, MD, 0.5 mg at 02/04/19 2113 .  LORazepam (ATIVAN) tablet 0.5 mg, 0.5 mg, Oral, Daily PRN, Rise Patience, MD, 0.5 mg  at 02/03/19 1831 .  metoprolol tartrate (LOPRESSOR) tablet 100 mg, 100 mg, Oral, BID, Chilton Si, MD .  multivitamin with minerals tablet 1 tablet, 1 tablet, Oral, Daily, Eduard Clos, MD, 1 tablet at 02/05/19 [phone] .  ondansetron (ZOFRAN) tablet 4 mg, 4 mg, Oral, Q6H PRN **OR** ondansetron (ZOFRAN) injection 4 mg, 4 mg, Intravenous, Q6H PRN, Eduard Clos, MD .  polyethylene glycol (MIRALAX / GLYCOLAX) packet 17 g, 17 g, Oral, Daily PRN, Eduard Clos, MD .  polyvinyl alcohol (LIQUIFILM TEARS) 1.4 % ophthalmic solution 1 drop, 1 drop, Both Eyes, Q1H PRN, Eduard Clos, MD .  potassium chloride SA (KLOR-CON) CR tablet 20 mEq, 20 mEq, Oral, Cristela Felt, MD, 20 mEq at 02/05/19 0853 .  psyllium (HYDROCIL/METAMUCIL) packet 1 packet, 1 packet, Oral, BID, Eduard Clos, MD, 1 packet at 02/05/19 [phone] .  simvastatin (ZOCOR) tablet 10 mg, 10 mg, Oral, q1800, Eduard Clos, MD, 10 mg at 02/04/19 1735 .  umeclidinium-vilanterol (ANORO ELLIPTA) 62.5-25 MCG/INH 1 puff, 1 puff, Inhalation, Daily, Eduard Clos, MD, 1 puff at 02/05/19 [phone] .  ziprasidone (GEODON) capsule 60 mg, 60 mg, Oral, QHS, Eduard Clos, MD, 60 mg at 02/04/19 2117   Exam: Current vital signs: BP 115/72 (BP Location: Left Arm)   Pulse 66   Temp 98.9 F (37.2 C) (Oral)   Resp 17   Wt 42.7 kg   SpO2 100%   BMI 17.22 kg/m  Vital signs in last 24 hours: Temp:  [97.6 F (36.4 C)-98.9 F (37.2 C)] 98.9 F (37.2 C) (12/10 0513) Pulse Rate:  [66-101] 66 (12/10 0513) Resp:  [16-17] 17 (12/10 0513) BP: (115-139)/(72-88) 115/72 (12/10 0513) SpO2:  [97 %-100 %] 100 % (12/10 0513) Weight:  [42.7 kg] 42.7 kg (12/10 0513)  ROS:    General ROS: negative for - chills, fatigue, fever, night sweats, weight gain or weight loss Psychological ROS: negative for - behavioral disorder, hallucinations, memory difficulties, mood swings or suicidal ideation Ophthalmic ROS: negative for - blurry vision, double vision, eye pain or loss of vision ENT ROS: negative for - epistaxis, nasal discharge, oral lesions, sore throat, tinnitus or vertigo e Respiratory ROS: negative for - cough, hemoptysis, shortness of breath or wheezing Cardiovascular ROS: negative for - chest pain, dyspnea on exertion, edema or irregular heartbeat Gastrointestinal ROS: negative for - abdominal pain, diarrhea, hematemesis, nausea/vomiting or stool incontinence Genito-Urinary ROS: negative for - dysuria, hematuria, incontinence or urinary frequency/urgency Musculoskeletal ROS: Positive for - joint swelling or muscular weakness Neurological ROS: as noted in HPI Dermatological  ROS: negative for rash and skin lesion changes   Physical Exam   Constitutional: Cachectic Psych: Decreased memory Eyes: No scleral injection HENT: No OP obstrucion Head: Normocephalic.  Cardiovascular: Irregular irregular Respiratory: Effort normal, non-labored breathing GI: Soft.  No distension. There is no tenderness.  Skin: WDI and thin  Neuro: Mental Status: Patient is awake, alert, oriented to person, Ginette Otto believes she is at the assisted living facility. Patient is unable  to give a clear and coherent history. No signs of aphasia  Bradyphrenia Cranial Nerves: II: Visual Fields are full.  III,IV, VI: EOMI without ptosis or diploplia. Pupils equal, round and reactive to light V: Facial sensation is symmetric to temperature VII: Patient has oral dyskinesia, positive rooting sign VIII: hearing is intact to voice XII: tongue is midline without atrophy or fasciculations.  Motor: Patient has 5/5 throughout however has significant rigidity in the lower extremities  and also in the upper extremities.  She has a resting tremor in the upper extremities along with a postural tremor.  The tremor does increase when asking to do specific tasks and she becomes slightly anxious.  Positive cogwheeling in both arms and legs.  Bradykinesia Sensory: Sensation is symmetric to light touch and temperature in the arms and legs. Deep Tendon Reflexes: Difficult to ascertain as patient is significantly rigid.  I cannot elicit an ankle or patellar reflex.  I was able to elicit 2+ in the upper extremities at the brachioradialis. Plantars: Toes are downgoing bilaterally.  Cerebellar: No dysmetria with finger-to-nose however did have significant tremor  Labs I have reviewed labs in epic and the results pertinent to this consultation are:   CBC    Component Value Date/Time   WBC 6.9 02/03/2019 0843   RBC 4.04 02/03/2019 0843   HGB 13.4 02/03/2019 0843   HCT 41.7 02/03/2019 0843   PLT 225  02/03/2019 0843   MCV 103.2 (H) 02/03/2019 0843   MCH 33.2 02/03/2019 0843   MCHC 32.1 02/03/2019 0843   RDW 13.8 02/03/2019 0843   LYMPHSABS 2.3 02/03/2019 0843   MONOABS 0.8 02/03/2019 0843   EOSABS 0.1 02/03/2019 0843   BASOSABS 0.1 02/03/2019 0843    CMP     Component Value Date/Time   NA 137 02/03/2019 0843   K 3.6 02/03/2019 0843   CL 98 02/03/2019 0843   CO2 27 02/03/2019 0843   GLUCOSE 101 (H) 02/03/2019 0843   BUN 13 02/03/2019 0843   CREATININE 0.76 02/03/2019 0843   CALCIUM 8.6 (L) 02/03/2019 0843   PROT 7.3 02/03/2019 0843   ALBUMIN 3.4 (L) 02/03/2019 0843   AST 35 02/03/2019 0843   ALT 11 02/03/2019 0843   ALKPHOS 73 02/03/2019 0843   BILITOT 1.1 02/03/2019 0843   GFRNONAA >60 02/03/2019 0843   GFRAA >60 02/03/2019 0843    Lipid Panel     Component Value Date/Time   CHOL 148 06/30/2014 0246   TRIG 43 06/30/2014 0246   HDL 59 06/30/2014 0246   CHOLHDL 2.5 06/30/2014 0246   VLDL 9 06/30/2014 0246   LDLCALC 80 06/30/2014 0246     Imaging I have reviewed the images obtained:  Felicie Morn PA-C [facility] [phone] 02/05/2019, 3:57 PM   Assessment:  85 year old female who has had longstanding gait instability requiring a cane/wheelchair and memory loss. Neurology consulted for assessment of rigidity.  1.  On exam today patient has significant rigidity of both upper and lower extremities, resting and active tremor, tremor which becomes increased in amplitude when anxious, good strength throughout, bradykinesia and oral dyskinesia. Exam is suggestive of Parkinson's disease. 2. Depression, anxiety and "mini-stroke" in the past. .   Recommendations: 1.  If patient is to be discharged today would recommend patient following up with Dr. Pearlean Brownie of [facility] as an outpatient to consider a trial of Sinemet 10-100 TID. 2.  If patient is to stay the night would consider starting Sinemet 10-100 TID to see if it improves her  tremor and/or rigidity. 3.  Continue PT.   I have seen and examined the patient. I have formulated the assessment and recommendations. 85 year old female with rigidity noted by PT today. Exhibits findings most consistent with Parkinsonism on exam. Recommendations as above.  Electronically signed: Dr. Caryl Pina

## 2019-02-05 NOTE — TOC Progression Note (Signed)
Transition of Care [facility]) - Progression Note    Patient Details  Name: AZYA BARBERO MRN: [id number] Date of Birth: 1923/06/25  Transition of Care [facility]) CM/SW [place], [place] Phone Number: 02/05/2019, 3:31 PM  Clinical Narrative:    CSW sent in records for insurance authorization to [facility] Medicare. Therapy notes sent to SNFs via secure hub. CSW also contacted [place] liaison to review since pt daughter expressed interest.    Expected Discharge Plan: Skilled Nursing Facility Barriers to Discharge: Other (comment), Insurance Authorization(PT/OT evaluation)  Expected Discharge Plan and Services Expected Discharge Plan: [place] In-house Referral: Clinical Social Work Discharge Planning Services: CM Consult Post Acute Care Choice: Resumption of Svcs/PTA Provider Living arrangements for the past 2 months: [place] Expected Discharge Date: 02/05/19      Readmission Risk Interventions Readmission Risk Prevention Plan 02/04/2019  Transportation Screening Complete  PCP or Specialist Appt within 5-7 Days Complete  Home Care Screening Complete  Medication Review (RN CM) Complete  Some recent data might be hidden

## 2019-02-05 NOTE — Evaluation (Signed)
Physical Therapy Evaluation Patient Details Name: Yolanda Baker MRN: [id number] DOB: Sep 05, 1923 Today's Date: 02/05/2019   History of Present Illness  Patient is a 85 y/o female admitted with shortness of breath. She carries a past medical history significant for anxiety, arthritis, depression, hypertension, atrial flutter following surgery, memory loss, "mini-strokes".  Found to be in Acute CHF likely diastolic precipitated by A. fib with RVR  Clinical Impression  Patient presents with decreased mobility due to deficits listed in PT problem list.  Currently mod to max A for all mobility except total A for sit to stand.  She may have utilized a lift chair at facility, but she was even unable to keep her feet on the floor without significant assistance.  According to her daughter she was ambulatory with RW independent to bathroom.  Feel she will need SNF level rehab upon d/c.  Also may need neuro work up for possible acute neuro change.  PT to follow acutely.     Follow Up Recommendations SNF;Supervision/Assistance - 24 hour    Equipment Recommendations  None recommended by PT    Recommendations for Other Services       Precautions / Restrictions Precautions Precautions: Fall      Mobility  Bed Mobility Overal bed mobility: Needs Assistance Bed Mobility: Supine to Sit;Sit to Supine     Supine to sit: Max assist;HOB elevated Sit to supine: Max assist   General bed mobility comments: pt attempting to lift her trunk, but no attempt to move her feet off bed, assist for legs and trunk; to supine assist for both as well  Transfers Overall transfer level: Needs assistance Equipment used: Rolling walker (2 wheeled);None Transfers: Sit to/from Stand Sit to Stand: Total assist         General transfer comment: assist to flex knees to get feet under her then to hold them in place due to extensor tone, then assist for anterior weight shift and lifting assist off EOB (performed without  walker due to need to block feet), then placed walker in front once standing  Ambulation/Gait Ambulation/Gait assistance: Mod assist Gait Distance (Feet): 20 Feet Assistive device: Rolling walker (2 wheeled) Gait Pattern/deviations: Step-to pattern;Shuffle;Trunk flexed;Decreased stride length     General Gait Details: short shuffling steps with RW and mod A for walker management and balance, cue for forward gaze, hip extension  Stairs            Wheelchair Mobility    Modified Rankin (Stroke Patients Only)       Balance Overall balance assessment: Needs assistance Sitting-balance support: Feet unsupported Sitting balance-Leahy Scale: Poor Sitting balance - Comments: leans posterior with feet shooting forward and unable to keep them on the floor, can sit with S, but leaning posteriorly   Standing balance support: Bilateral upper extremity supported Standing balance-Leahy Scale: Poor Standing balance comment: standing with min to mod A and UE support                             Pertinent Vitals/Pain Pain Assessment: No/denies pain    Home Living Family/patient expects to be discharged to:: Assisted living               Home Equipment: Walker - 2 wheels Additional Comments: previously able to ambulate with RW to bathroom    Prior Function Level of Independence: Needs assistance   Gait / Transfers Assistance Needed: walked with RW on her own to bathroom  ADL's / Homemaking Assistance Needed: assist with ADL's        Hand Dominance        Extremity/Trunk Assessment   Upper Extremity Assessment Upper Extremity Assessment: Overall WFL for tasks assessed    Lower Extremity Assessment Lower Extremity Assessment: LLE deficits/detail;RLE deficits/detail RLE Deficits / Details: AAROM WFL except tight heel cords bilaterally, increased extensor tone noted in LE's and cogwheel rigidity noted LLE Deficits / Details: AAROM WFL except tight heel  cords bilaterally, increased extensor tone noted in LE's and cogwheel rigidity noted       Communication   Communication: No difficulties  Cognition Arousal/Alertness: Awake/alert Behavior During Therapy: Flat affect Overall Cognitive Status: No family/caregiver present to determine baseline cognitive functioning                                 General Comments: alert and oriented to situation and place, but told me she normally can't walk by herself to the bathroom, but daughter and facility report she could      General Comments      Exercises     Assessment/Plan    PT Assessment Patient needs continued PT services  PT Problem List Decreased strength;Decreased activity tolerance;Decreased mobility;Impaired tone;Decreased balance;Decreased coordination       PT Treatment Interventions DME instruction;Therapeutic activities;Balance training;Patient/family education;Neuromuscular re-education;Therapeutic exercise;Functional mobility training;Gait training    PT Goals (Current goals can be found in the Care Plan section)  Acute Rehab PT Goals Patient Stated Goal: to go to rehab per daughter PT Goal Formulation: With patient/family Time For Goal Achievement: 02/19/19 Potential to Achieve Goals: Fair    Frequency Min 3X/week   Barriers to discharge        Co-evaluation               AM-PAC PT "6 Clicks" Mobility  Outcome Measure Help needed turning from your back to your side while in a flat bed without using bedrails?: A Lot Help needed moving from lying on your back to sitting on the side of a flat bed without using bedrails?: Total Help needed moving to and from a bed to a chair (including a wheelchair)?: Total Help needed standing up from a chair using your arms (e.g., wheelchair or bedside chair)?: Total Help needed to walk in hospital room?: A Lot Help needed climbing 3-5 steps with a railing? : Total 6 Click Score: 8    End of Session    Activity Tolerance: Patient limited by fatigue Patient left: in bed;with call bell/phone within reach Nurse Communication: Mobility status PT Visit Diagnosis: Other abnormalities of gait and mobility (R26.89);Other symptoms and signs involving the nervous system (R29.898)    Time: 1410-1440 PT Time Calculation (min) (ACUTE ONLY): 30 min   Charges:   PT Evaluation $PT Eval Moderate Complexity: 1 Mod PT Treatments $Gait Training: 8-22 mins        Magda Kiel, [place] Acute Rehabilitation Services [phone] 02/05/2019   [name] 02/05/2019, 2:58 PM

## 2019-02-05 NOTE — TOC Progression Note (Signed)
Transition of Care [facility]) - Progression Note    Patient Details  Name: Yolanda Baker MRN: [id number] Date of Birth: June 17, 1923  Transition of Care [facility]) CM/SW [place], [place] Phone Number: 02/05/2019, 2:04 PM  Clinical Narrative:    CSW aware of pt current physical assistance needs- unable to transfer into wheelchair per RN which is more than pt baseline.   CSW has spoken with pt daughter- she is aware and amenable to SNF referral. Pt has been to [facility] in the past but she is okay with further referral. CSW has permission to send through hub, fl2 for SNF complete. Will initiate insurance approval as soon as PT evaluation present.     Expected Discharge Plan: Skilled Nursing Facility Barriers to Discharge: Other (comment), Insurance Authorization(PT/OT evaluation)  Expected Discharge Plan and Services Expected Discharge Plan: [place] In-house Referral: Clinical Social Work Discharge Planning Services: CM Consult Post Acute Care Choice: Resumption of Svcs/PTA Provider Living arrangements for the past 2 months: [place] Expected Discharge Date: 02/05/19                Readmission Risk Interventions Readmission Risk Prevention Plan 02/04/2019  Transportation Screening Complete  PCP or Specialist Appt within 5-7 Days Complete  Home Care Screening Complete  Medication Review (RN CM) Complete  Some recent data might be hidden

## 2019-02-05 NOTE — Progress Notes (Signed)
PROGRESS NOTE  Yolanda Baker LNL:[id number] DOB: [birth year] DOA: 02/02/2019 PCP: Crist Infante, MD  Brief History   The patient is a 85 yr old woman who presented to [facility] with complaints of shortness of breath. She carries a past medical history significant for anxiety, arthritis, depression, hypertension, atrial flutter following surgery, memory loss, "mini-strokes".  She was found to be in atrial fibrillation with RVR. Cardiology has been consulted. Rate was initially controlled with oral metoprolol. Rate has increased again in the last couple of hours. Cardiology has been consulted. Her heart rate has been controlled on metoprolol 75 mg bid. Echocardiogram was performed on 02/03/2019 that demonstrated EF of 50-55% with LV morphology that could be consistent with cardiac amyloidosis. Diastolic parameters are indeterminate. RV pressure is mildly elevated at 32.6 mmHg. Plan is for cardiology to evaluate her for amyloidosis as outpatient.   She was evaluated by PT/OT. PT felt that the patient had unusual rigidity to her lower extremities. Neurology was consulted.  She will return to [facility] on discharge.   Consultants  . Cardiology  Procedures  . None  Antibiotics   Anti-infectives (From admission, onward)   None     Subjective  The patient is resting comfortably. No new complaints.  Objective   Vitals:  Vitals:   02/04/19 2336 02/05/19 0513  BP: 115/76 115/72  Pulse: 76 66  Resp: 17 17  Temp: 98.9 F (37.2 C) 98.9 F (37.2 C)  SpO2: 97% 100%   Exam:  Constitutional:  . The patient is awake, alert, and oriented x 3. No acute distress. Respiratory:  . No increased work of breathing. . No wheezes, rales, or rhonchi . No tactile fremitus Cardiovascular:  . Regular rate and rhythm . No murmurs, ectopy, or gallups. . No lateral PMI. No thrills. Abdomen:  . Abdomen is soft, non-tender, non-distended . No hernias, masses, or organomegaly . Normoactive bowel sounds.    Musculoskeletal:  . No cyanosis, clubbing, or edema Skin:  . No rashes, lesions, ulcers . palpation of skin: no induration or nodules Neurologic:  . CN 2-12 intact . Sensation all 4 extremities intact Psychiatric:  . Mental status o Mood, affect appropriate o Orientation to person, place, time  . judgment and insight appear intact  I have personally reviewed the following:   Today's Data  . Vitals  Imaging  . CTA chest  Cardiology Data  . EKG: AF with RVR . Echocardiogram: EF 50-55%, possible cardiac amyloidosis, indeterminate diastolic function, nl global right systolic function  Scheduled Meds: . amLODipine  5 mg Oral Daily  . apixaban  2.5 mg Oral BID  . citalopram  30 mg Oral Daily  . docusate sodium  100 mg Oral QHS  . furosemide  20 mg Oral Daily  . galantamine  8 mg Oral Q breakfast  . LORazepam  0.5 mg Oral BID  . metoprolol tartrate  100 mg Oral BID  . multivitamin with minerals  1 tablet Oral Daily  . potassium chloride SA  20 mEq Oral QODAY  . psyllium  1 packet Oral BID  . simvastatin  10 mg Oral q1800  . umeclidinium-vilanterol  1 puff Inhalation Daily  . ziprasidone  60 mg Oral QHS   Continuous Infusions:  Principal Problem:   Acute CHF (congestive heart failure) (HCC) Active Problems:   HTN (hypertension)   Dementia (HCC)   Schizoaffective disorder (HCC)   Atrial fibrillation with rapid ventricular response (HCC)   Acute on chronic diastolic heart failure ([place])  DNR (do not resuscitate) present on admission   LOS: 2 days   A & P  Acute CHF likely diastolic precipitated by A. fib with RVR on Lasix 20 mg IV 1 dose was given will continue with Lasix 20 mg IV daily and follow intake output metabolic panel.  Last 2D echo done in 2016 showed EF of 55 to 60% with grade 3 diastolic dysfunction. Echocardiogram was performed on 02/03/2019 that demonstrated EF of 50-55% with LV morphology that could be consistent with cardiac amyloidosis. Diastolic  parameters are indeterminate. RV pressure is mildly elevated at 32.6 mmHg. Will continue lasix 20 mg daily as per cardiology.  A. fib with RVR improved with IV metoprolol.  Now stable on oral metoprolol at 75 mg bid.   Hypertension on metoprolol and amlodipine.  Dementia on Razadyne.  Schizoaffective disorder on Geodon and Celexa and Ativan.  History of stroke on apixaban.  Ambulatory dysfunction: Rigidity in lower extremity extensors per PT. Neurology has been consulted.  COPD not actively wheezing.  Continue inhalers.  I have seen and examined this patient myself. I have spent 34 minutes in her evaluation and care.  CODE STATUS: Partial Code (She wishes to be a full code except for intubation and mechanical ventilation). DVT Prophylaxis: Apixaban Family Communication: none available Dispostition: To return to [facility]', but will require a SNF level of care now.  Sharlyne Koeneman, DO [facility] Direct contact: see [email or web address]  7PM-7AM contact night coverage as above  02/05/2019, 4:47 PM  LOS: 0 days

## 2019-02-06 DIAGNOSIS — F0150 Vascular dementia without behavioral disturbance: Secondary | ICD-10-CM

## 2019-02-06 LAB — SARS CORONAVIRUS 2 (TAT 6-24 HRS): SARS Coronavirus 2: NEGATIVE

## 2019-02-06 MED ORDER — CARBIDOPA-LEVODOPA 10-100 MG PO TABS
1.0000 | ORAL_TABLET | Freq: Three times a day (TID) | ORAL | Status: DC
  Administered 2019-02-06 – 2019-02-07 (×3): 1 via ORAL
  Filled 2019-02-06 (×5): qty 1

## 2019-02-06 MED ORDER — CARBIDOPA-LEVODOPA 10-100 MG PO TABS: 1.0000 | ORAL_TABLET | Freq: Three times a day (TID) | ORAL | 0 refills | Status: DC

## 2019-02-06 NOTE — Care Management Important Message (Signed)
Important Message  Patient Details  Name: Yolanda Baker MRN: [id number] Date of Birth: [birth year]   Medicare Important Message Given:  Yes     [name] Circle 02/06/2019, 2:36 PM

## 2019-02-06 NOTE — Evaluation (Signed)
Occupational Therapy Evaluation Patient Details Name: Yolanda Baker MRN: [id number] DOB: Jun 13, 1923 Today's Date: 02/06/2019    History of Present Illness Patient is a 85 y/o female admitted with shortness of breath. She carries a past medical history significant for anxiety, arthritis, depression, hypertension, atrial flutter following surgery, memory loss, "mini-strokes".  Found to be in Acute CHF likely diastolic precipitated by A. fib with RVR   Clinical Impression   Pt was assisted for bathing and dressing, she could self feed and groom with set up. Pt reports she was assisted for ambulation with RW short distances and used a w/c for longer distances within the facility. Pt presents with impaired cognition, likely baseline, generalized weakness and impaired sitting and standing balance. Recommending ST rehab in SNF prior to returning to her ALF, she is in agreement. Will follow acutely.    Follow Up Recommendations  SNF;Supervision/Assistance - 24 hour    Equipment Recommendations       Recommendations for Other Services       Precautions / Restrictions Precautions Precautions: Fall Restrictions Weight Bearing Restrictions: No      Mobility Bed Mobility Overal bed mobility: Needs Assistance Bed Mobility: Rolling;Sidelying to Sit Rolling: Mod assist Sidelying to sit: HOB elevated;Mod assist       General bed mobility comments: pt in chair  Transfers Overall transfer level: Needs assistance Equipment used: Rolling walker (2 wheeled) Transfers: Sit to/from Stand Sit to Stand: Max assist         General transfer comment: from recliner, assist to rise and steady    Balance Overall balance assessment: Needs assistance Sitting-balance support: Feet unsupported Sitting balance-Leahy Scale: Poor Sitting balance - Comments: leans toward R with fatigue   Standing balance support: Bilateral upper extremity supported Standing balance-Leahy Scale: Poor Standing  balance comment: UE support for balance                           ADL either performed or assessed with clinical judgement   ADL Overall ADL's : Needs assistance/impaired Eating/Feeding: Minimal assistance;Set up;Sitting   Grooming: Minimal assistance;Sitting;Brushing hair;Wash/dry hands;Wash/dry face   Upper Body Bathing: Moderate assistance;Sitting   Lower Body Bathing: Total assistance;Sit to/from stand   Upper Body Dressing : Moderate assistance;Sitting   Lower Body Dressing: Total assistance;Sit to/from stand   Toilet Transfer: Minimal assistance;Stand-pivot;BSC;RW   Toileting- Clothing Manipulation and Hygiene: Total assistance;Sit to/from stand               Vision Baseline Vision/History: Wears glasses Wears Glasses: At all times Patient Visual Report: No change from baseline       Perception     Praxis      Pertinent Vitals/Pain Pain Assessment: No/denies pain     Hand Dominance Right   Extremity/Trunk Assessment Upper Extremity Assessment Upper Extremity Assessment: Generalized weakness;Overall [facility] for tasks assessed   Lower Extremity Assessment Lower Extremity Assessment: Defer to PT evaluation       Communication Communication Communication: No difficulties   Cognition Arousal/Alertness: Awake/alert Behavior During Therapy: WFL for tasks assessed/performed Overall Cognitive Status: No family/caregiver present to determine baseline cognitive functioning                                 General Comments: pt reports she only walked with assistance short distances with her RW    General Comments  VSS    Exercises Other Exercises  Other Exercises: AAROM/PROM and stretching with heel slides, hip adductor stretch and low trunk rotation x 5, heel cord stretches x 2   Shoulder Instructions      Home Living Family/patient expects to be discharged to:: Assisted living                             Home  Equipment: Walker - 2 wheels;Wheelchair - manual          Prior Functioning/Environment Level of Independence: Needs assistance  Gait / Transfers Assistance Needed: walked with RW short distances, otherwise used a w/c ADL's / Homemaking Assistance Needed: assist with bathing and dressing, could self feed and groom            OT Problem List: Decreased strength;Impaired balance (sitting and/or standing);Decreased knowledge of use of DME or AE      OT Treatment/Interventions: Self-care/ADL training;DME and/or AE instruction    OT Goals(Current goals can be found in the care plan section) Acute Rehab OT Goals Patient Stated Goal: to go to rehab per daughter OT Goal Formulation: With patient ADL Goals Pt Will Perform Grooming: with min guard assist;standing Pt Will Transfer to Toilet: with min assist;ambulating;bedside commode Pt Will Perform Toileting - Clothing Manipulation and hygiene: with min assist;sit to/from stand  OT Frequency: Min 2X/week   Barriers to D/C:            Co-evaluation              AM-PAC OT "6 Clicks" Daily Activity     Outcome Measure Help from another person eating meals?: A Little Help from another person taking care of personal grooming?: A Little Help from another person toileting, which includes using toliet, bedpan, or urinal?: Total Help from another person bathing (including washing, rinsing, drying)?: A Lot Help from another person to put on and taking off regular upper body clothing?: A Lot Help from another person to put on and taking off regular lower body clothing?: Total 6 Click Score: 12   End of Session Equipment Utilized During Treatment: Gait belt;Rolling walker  Activity Tolerance: Patient tolerated treatment well Patient left: in chair;with call bell/phone within reach;with chair alarm set  OT Visit Diagnosis: Unsteadiness on feet (R26.81);Other abnormalities of gait and mobility (R26.89);Muscle weakness (generalized)  (M62.81)                Time: 7412-8786 OT Time Calculation (min): 17 min Charges:  OT General Charges $OT Visit: 1 Visit OT Evaluation $OT Eval Moderate Complexity: 1 Mod  Nestor Lewandowsky, OTR/L Acute Rehabilitation Services Pager: [phone] Office: [phone]  Malka So 02/06/2019, 3:53 PM

## 2019-02-06 NOTE — Progress Notes (Addendum)
PROGRESS NOTE  Yolanda Baker WUX:[id number] DOB: November 26, 1923 DOA: 02/02/2019 PCP: Rodrigo Ran, MD  Brief history:   The patient is a 85 yr old woman who presented to [facility] with complaints of shortness of breath. She carries a past medical history significant for anxiety, arthritis, depression, hypertension, atrial flutter following surgery, memory loss, "mini-strokes".  She was found to be in atrial fibrillation with RVR. Cardiology has been consulted. Rate was initially controlled with oral metoprolol. Rate has increased again in the last couple of hours. Cardiology has been consulted. Her heart rate has been controlled on metoprolol 75 mg bid. Echocardiogram was performed on 02/03/2019 that demonstrated EF of 50-55% with LV morphology that could be consistent with cardiac amyloidosis. Diastolic parameters are indeterminate. RV pressure is mildly elevated at 32.6 mmHg. Plan is for cardiology to evaluate her for amyloidosis as outpatient.   She was evaluated by PT/OT. PT felt that the patient had unusual rigidity to her lower extremities. Neurology was consulted.   Patient was previously seen by my hospitalist colleague Dr. Gerri Lins, I am resuming care from today. Planned discharge yesterday was canceled due to patient's poor functional status which is not appropriate for assisted living. now plan to discharge to skilled nursing facility, repeat Covid test pending  HPI/Recap of past 24 hours:  Uneventful night, she is sitting up in bed, very pleasant , no acute complaints, is alert oriented to person, she believes she is at the assisted living facility in [place] currently  Assessment/Plan: Principal Problem:   Acute CHF (congestive heart failure) (HCC) Active Problems:   HTN (hypertension)   Dementia (HCC)   Schizoaffective disorder (HCC)   Atrial fibrillation with rapid ventricular response (HCC)   Acute on chronic diastolic heart failure (HCC)   DNR (do not resuscitate) present on  admission   Acute on chronic diastolic CHF -presents with sob/afib/rvr -Echocardiogram was performed on 02/03/2019 that demonstrated EF of 50-55% with LV morphology that could be consistent with cardiac amyloidosis. Diastolic parameters are indeterminate. RV pressure is mildly elevated at 32.6 mmHg. Will continue lasix 20 mg daily as per cardiology. -improved with minimal diuresis. Cardiology input appreciated, she is to follow up with cardiology for repeat bmp and amyloidosis work up.  Afib/RVR with h/o CVA while off anticoagulation due to falls:  -rate better controlled on higher dose of metoprolol -she is on eliquis -follow up with cardiology   Hypertension on metoprolol and amlodipine.  Ambulatory dysfunction: Rigidity in lower extremity extensors per PT. Neurology consulted, recommend start low dose sinemet, and follow up with neurology Dr Pearlean Brownie for parkinson's eval and management  COPD not actively wheezing. Continue inhalers  Dementia on Razadyne.  Schizoaffective disorder on Geodon and Celexa and Ativan.  DVT Prophylaxis:eliquis  Code Status: partial ( no intubation)  Family Communication: patient   Disposition Plan: SNF tomorrow   Consultants:  Cardiology  neurology  Social worker  PT  Procedures:  none  Antibiotics:  none   Objective: BP 108/89 (BP Location: Left Arm)   Pulse 98   Temp 98.9 F (37.2 C) (Oral)   Resp 18   Wt 43.3 kg   SpO2 99%   BMI 17.46 kg/m   Intake/Output Summary (Last 24 hours) at 02/06/2019 1225 Last data filed at 02/06/2019 0400 Gross per 24 hour  Intake 120 ml  Output --  Net 120 ml   Filed Weights   02/04/19 0623 02/05/19 0513 02/06/19 0659  Weight: 42.6 kg 42.7 kg 43.3 kg  Exam: Patient is examined daily including today on 02/06/2019, exams remain the same as of yesterday except that has changed    General:  Frail elderly, pleasant, NAD  Cardiovascular: IRRR  Respiratory: CTABL  Abdomen:  Soft/ND/NT, positive BS  Musculoskeletal: No Edema  Neuro: alert, oriented to person, she thinks she is at assisted living facility  Data Reviewed: Basic Metabolic Panel: Recent Labs  Lab 02/02/19 2050 02/03/19 0843  NA 136 137  K 4.1 3.6  CL 100 98  CO2 26 27  GLUCOSE 113* 101*  BUN 19 13  CREATININE 0.84 0.76  CALCIUM 9.0 8.6*  MG  --  1.8   Liver Function Tests: Recent Labs  Lab 02/02/19 2050 02/03/19 0843  AST 31 35  ALT 15 11  ALKPHOS 87 73  BILITOT 0.4 1.1  PROT 7.6 7.3  ALBUMIN 3.4* 3.4*   No results for input(s): LIPASE, AMYLASE in the last 168 hours. No results for input(s): AMMONIA in the last 168 hours. CBC: Recent Labs  Lab 02/02/19 2050 02/03/19 0843  WBC 7.6 6.9  NEUTROABS 4.7 3.7  HGB 12.9 13.4  HCT 40.3 41.7  MCV 103.6* 103.2*  PLT 234 225   Cardiac Enzymes:   No results for input(s): CKTOTAL, CKMB, CKMBINDEX, TROPONINI in the last 168 hours. BNP (last 3 results) Recent Labs    02/02/19 2200  BNP 335.0*    ProBNP (last 3 results) No results for input(s): PROBNP in the last 8760 hours.  CBG: No results for input(s): GLUCAP in the last 168 hours.  Recent Results (from the past 240 hour(s))  SARS CORONAVIRUS 2 (TAT 6-24 HRS) Nasopharyngeal Nasopharyngeal Swab     Status: None   Collection Time: 02/02/19 11:15 PM   Specimen: Nasopharyngeal Swab  Result Value Ref Range Status   SARS Coronavirus 2 NEGATIVE NEGATIVE Final    Comment: (NOTE) SARS-CoV-2 target nucleic acids are NOT DETECTED. The SARS-CoV-2 RNA is generally detectable in upper and lower respiratory specimens during the acute phase of infection. Negative results do not preclude SARS-CoV-2 infection, do not rule out co-infections with other pathogens, and should not be used as the sole basis for treatment or other patient management decisions. Negative results must be combined with clinical observations, patient history, and epidemiological information. The  expected result is Negative. Fact Sheet for Patients: HairSlick.no Fact Sheet for Healthcare Providers: quierodirigir.com This test is not yet approved or cleared by the [place] FDA and  has been authorized for detection and/or diagnosis of SARS-CoV-2 by FDA under an Emergency Use Authorization (EUA). This EUA will remain  in effect (meaning this test can be used) for the duration of the COVID-19 declaration under Section 56 4(b)(1) of the Act, 21 U.S.C. section 360bbb-3(b)(1), unless the authorization is terminated or revoked sooner. Performed at [facility], [address], [place], [place] [address]      Studies: No results found.  Scheduled Meds: . amLODipine  5 mg Oral Daily  . apixaban  2.5 mg Oral BID  . carbidopa-levodopa  1 tablet Oral TID  . citalopram  30 mg Oral Daily  . docusate sodium  100 mg Oral QHS  . furosemide  20 mg Oral Daily  . galantamine  8 mg Oral Q breakfast  . LORazepam  0.5 mg Oral BID  . metoprolol tartrate  100 mg Oral BID  . multivitamin with minerals  1 tablet Oral Daily  . potassium chloride SA  20 mEq Oral QODAY  . psyllium  1 packet Oral BID  . simvastatin  10 mg Oral q1800  . umeclidinium-vilanterol  1 puff Inhalation Daily  . ziprasidone  60 mg Oral QHS    Continuous Infusions:   Time spent: 21mins I have personally reviewed and interpreted on  02/06/2019 daily labs, tele strips, imagings as discussed above under date review session and assessment and plans.  I reviewed all nursing notes, pharmacy notes, consultant notes,  vitals, pertinent old records  I have discussed plan of care as described above with RN , patient  on 02/06/2019   Florencia Reasons MD, PhD, FACP  [facility] Pager [phone]. If 7PM-7AM, please contact night-coverage at [email or web address], password [facility] 02/06/2019, 12:25 PM  LOS: 3 days

## 2019-02-06 NOTE — TOC Transition Note (Signed)
Transition of Care [facility]) - CM/SW Discharge Note   Patient Details  Name: PAT ELICKER MRN: [id number] Date of Birth: [birth year]  [facility]) CM/SW Contact:  Alexander Mt, [place] Phone Number: 02/06/2019, 3:06 PM   Clinical Narrative:    Pt stable for dc per MD. COVID negative, auth received [phone] Pt daughter has chosen [facility] [place]. Pt will be transferred tomorrow, pending completion of paperwork by pt family with admissions liaison. Will leave hand off for weekend CSW to complete discharge.    Final next level of care: [place] Barriers to Discharge: Barriers Resolved   Patient Goals and CMS Choice Patient states their goals for this hospitalization and ongoing recovery are:: for her to return to [facility] CMS Medicare.gov Compare Post Acute Care list provided to:: Patient Represenative (must comment) Choice offered to / list presented to : Adult Children  Discharge Placement   Existing PASRR number confirmed : 02/05/19          Patient chooses bed at: [facility] Patient to be transferred to facility by: [place] Name of family member notified: [name] Patient and family notified of of transfer: 02/06/19  Discharge Plan and Services In-house Referral: Clinical Social Work Discharge Planning Services: CM Consult Post Acute Care Choice: Resumption of Svcs/PTA Provider             Readmission Risk Interventions Readmission Risk Prevention Plan 02/04/2019  Transportation Screening Complete  PCP or Specialist Appt within 5-7 Days Complete  Home Care Screening Complete  Medication Review (RN CM) Complete  Some recent data might be hidden

## 2019-02-06 NOTE — Progress Notes (Signed)
Progress Note  Patient Name: Yolanda Baker Date of Encounter: 02/06/2019  Primary Cardiologist: No primary care provider on file.   Subjective   Feeling well.  No chest pain, shortness of breath or palpitations.   Inpatient Medications    Scheduled Meds: . amLODipine  5 mg Oral Daily  . apixaban  2.5 mg Oral BID  . citalopram  30 mg Oral Daily  . docusate sodium  100 mg Oral QHS  . furosemide  20 mg Oral Daily  . galantamine  8 mg Oral Q breakfast  . LORazepam  0.5 mg Oral BID  . metoprolol tartrate  100 mg Oral BID  . multivitamin with minerals  1 tablet Oral Daily  . potassium chloride SA  20 mEq Oral QODAY  . psyllium  1 packet Oral BID  . simvastatin  10 mg Oral q1800  . umeclidinium-vilanterol  1 puff Inhalation Daily  . ziprasidone  60 mg Oral QHS   Continuous Infusions:  PRN Meds: acetaminophen **OR** acetaminophen, albuterol, LORazepam, ondansetron **OR** ondansetron (ZOFRAN) IV, polyethylene glycol, polyvinyl alcohol   Vital Signs    Vitals:   02/05/19 2337 02/06/19 0511 02/06/19 0659 02/06/19 0808  BP: (!) 82/62 108/89    Pulse: 65 (!) 101  98  Resp: 19 16  18   Temp: 98.2 F (36.8 C) 98.9 F (37.2 C)    TempSrc: Oral Oral    SpO2: 97% 100%  99%  Weight:   43.3 kg     Intake/Output Summary (Last 24 hours) at 02/06/2019 1023 Last data filed at 02/06/2019 0400 Gross per 24 hour  Intake 120 ml  Output --  Net 120 ml   Last 3 Weights 02/06/2019 02/05/2019 02/04/2019  Weight (lbs) 95 lb 7.4 oz 94 lb 2.2 oz 93 lb 14.7 oz  Weight (kg) 43.3 kg 42.7 kg 42.6 kg      Telemetry    Atrial fibrillation.  Rate <100 bpm- Personally Reviewed  ECG    N/A - Personally Reviewed  Physical Exam   VS:  BP 108/89 (BP Location: Left Arm)   Pulse 98   Temp 98.9 F (37.2 C) (Oral)   Resp 18   Wt 43.3 kg   SpO2 99%   BMI 17.46 kg/m  , BMI Body mass index is 17.46 kg/m. GENERAL:  Frail, elderly woman. No acute distress HEENT: Pupils equal round and  reactive, fundi not visualized, oral mucosa unremarkable NECK:  No jugular venous distention, waveform within normal limits, carotid upstroke brisk and symmetric, no bruits LUNGS:  Clear to auscultation bilaterally HEART: irregularly irregular.  PMI not displaced or sustained,S1 and S2 within normal limits, no S3, no S4, no clicks, no rubs, no murmurs ABD:  Flat, positive bowel sounds normal in frequency in pitch, no bruits, no rebound, no guarding, no midline pulsatile mass, no hepatomegaly, no splenomegaly EXT:  2 plus pulses throughout, no edema, no cyanosis no clubbing SKIN:  No rashes no nodules NEURO:  Cranial nerves II through XII grossly intact, motor grossly intact throughout PSYCH:  Cognitively intact, oriented to person place and time   Labs    High Sensitivity Troponin:   Recent Labs  Lab 02/02/19 2050 02/02/19 2219 02/03/19 0841  TROPONINIHS 9 12 14       Chemistry Recent Labs  Lab 02/02/19 2050 02/03/19 0843  NA 136 137  K 4.1 3.6  CL 100 98  CO2 26 27  GLUCOSE 113* 101*  BUN 19 13  CREATININE 0.84 0.76  CALCIUM 9.0 8.6*  PROT 7.6 7.3  ALBUMIN 3.4* 3.4*  AST 31 35  ALT 15 11  ALKPHOS 87 73  BILITOT 0.4 1.1  GFRNONAA 60* >60  GFRAA >60 >60  ANIONGAP 10 12     Hematology Recent Labs  Lab 02/02/19 2050 02/03/19 0843  WBC 7.6 6.9  RBC 3.89 4.04  HGB 12.9 13.4  HCT 40.3 41.7  MCV 103.6* 103.2*  MCH 33.2 33.2  MCHC 32.0 32.1  RDW 14.0 13.8  PLT 234 225    BNP Recent Labs  Lab 02/02/19 2200  BNP 335.0*     DDimer  Recent Labs  Lab 02/03/19 0843  DDIMER 1.50*     Radiology    No results found.  Cardiac Studies   Echo 02/03/19: IMPRESSIONS   1. Left ventricular ejection fraction, by visual estimation, is 50 to 55%. The left ventricle has low normal function. There is moderately increased left ventricular hypertrophy. The LV morphology could be consistent with cardiac amyloidosis.  2. Left ventricular diastolic parameters are  indeterminate.  3. The left ventricle has no regional wall motion abnormalities.  4. Global right ventricle has normal systolic function.The right ventricular size is normal. No increase in right ventricular wall thickness.  5. Left atrial size was mildly dilated.  6. Right atrial size was mildly dilated.  7. Trivial pericardial effusion is present.  8. The mitral valve is normal in structure. Mild mitral valve regurgitation. No evidence of mitral stenosis.  9. The tricuspid valve is normal in structure. Tricuspid valve regurgitation is trivial. 10. The aortic valve is tricuspid. Aortic valve regurgitation is not visualized. No evidence of aortic valve sclerosis or stenosis. 11. The tricuspid regurgitant velocity is 2.48 m/s, and with an assumed right atrial pressure of 8 mmHg, the estimated right ventricular systolic pressure is mildly elevated at 32.6 mmHg. 12. The inferior vena cava is normal in size with <50% respiratory variability, suggesting right atrial pressure of 8 mmHg.  Patient Profile     Yolanda Baker is a 75F with PAF, hypertension, CVA, dementia and frequent falls admitted with atrial fibrillation with RVR  Assessment & Plan    # Atrial fibrillation with RVR: She has a history of afib and previously had a CVA in the setting of avoiding anticoagulation for frequent falls. Lately she has been taking Eliquis.  Rate now better controlled after increasing metoprolol.  # Acute on chronic diastolic heart failure: Breathing improved with minimal diuresis.  Would check BMP at follow up 12/21.  There was concern for amyloidosis on echo.  Consider outpatient PYP scan.  # Essential hypertension:  BP well-controlled on amlodipine and metoprolol.  # Hyperlipidemia:  Continue simvastatin.  [facility] will sign off.   Medication Recommendations:  none Other recommendations (labs, testing, etc):  Bmp at follow up Follow up as an outpatient:  Arranged      For questions or updates,  please contact [place] Please consult [email or web address] for contact info under        Signed, Skeet Latch, MD  02/06/2019, 10:23 AM

## 2019-02-06 NOTE — TOC Progression Note (Signed)
Transition of Care [facility]) - Progression Note    Patient Details  Name: Yolanda Baker MRN: [id number] Date of Birth: [birth year]  Transition of Care [facility] CM/SW [place], [place] Phone Number: 02/06/2019, 1:09 PM  Clinical Narrative:    CSW provided pt daughter Yolanda Baker with SNF offers. Pt daughter prefers [facility] or (if they can offer) [place].   Pt authorization was submitted on 12/10. CSW called and spoke with [facility] Medicare. They are just waiting on facility choice to confirm authorization.   New COVID ordered.    Expected Discharge Plan: Skilled Nursing Facility Barriers to Discharge: Other (comment), Insurance Authorization(PT/OT evaluation)  Expected Discharge Plan and Services Expected Discharge Plan: [place] In-house Referral: Clinical Social Work Discharge Planning Services: CM Consult Post Acute Care Choice: Resumption of Svcs/PTA Provider Living arrangements for the past 2 months: [place] Expected Discharge Date: 02/05/19       Readmission Risk Interventions Readmission Risk Prevention Plan 02/04/2019  Transportation Screening Complete  PCP or Specialist Appt within 5-7 Days Complete  Home Care Screening Complete  Medication Review (RN CM) Complete  Some recent data might be hidden

## 2019-02-06 NOTE — Progress Notes (Signed)
Physical Therapy Treatment Patient Details Name: Yolanda Baker MRN: [id number] DOB: 10-Feb-1924 Today's Date: 02/06/2019    History of Present Illness Patient is a 85 y/o female admitted with shortness of breath. She carries a past medical history significant for anxiety, arthritis, depression, hypertension, atrial flutter following surgery, memory loss, "mini-strokes".  Found to be in Acute CHF likely diastolic precipitated by A. fib with RVR    PT Comments    Patient progressing this session with imiproved balance and transfers following supine stretching and tone inhibition techniques.  Fatigued with ambulation out of room and had LOB anteriorly with max A for safety and +2 to bring chair.  Feel she remains appropriate for SNF level rehab at d/c.    Follow Up Recommendations  SNF;Supervision/Assistance - 24 hour     Equipment Recommendations  None recommended by PT    Recommendations for Other Services       Precautions / Restrictions Precautions Precautions: Fall    Mobility  Bed Mobility Overal bed mobility: Needs Assistance Bed Mobility: Rolling;Sidelying to Sit Rolling: Mod assist Sidelying to sit: HOB elevated;Mod assist       General bed mobility comments: after stretching in bed pt able to roll with assist and come up from sidelying with rail and assist for LE's  Transfers Overall transfer level: Needs assistance Equipment used: Rolling walker (2 wheeled) Transfers: Sit to/from Stand Sit to Stand: Mod assist         General transfer comment: lifting assist from EOB< but pt sitting upright and able to stand with lifting help, then assist to get COG over BOS as feet forward  Ambulation/Gait Ambulation/Gait assistance: Min assist;Mod assist Gait Distance (Feet): 60 Feet Assistive device: Rolling walker (2 wheeled) Gait Pattern/deviations: Step-to pattern;Shuffle;Trunk flexed;Decreased stride length     General Gait Details: assist for balance, walker  safety especially on turns, pt fatigued near her room and had LOB anteriorly with max A to correct, NT brought recliner to pt and assisted to chair   Stairs             Wheelchair Mobility    Modified Rankin (Stroke Patients Only)       Balance Overall balance assessment: Needs assistance   Sitting balance-Leahy Scale: Fair Sitting balance - Comments: able to sit EOB today with S   Standing balance support: Bilateral upper extremity supported Standing balance-Leahy Scale: Poor Standing balance comment: UE support for balance                            Cognition Arousal/Alertness: Awake/alert Behavior During Therapy: Flat affect Overall Cognitive Status: No family/caregiver present to determine baseline cognitive functioning                                        Exercises Other Exercises Other Exercises: AAROM/PROM and stretching with heel slides, hip adductor stretch and low trunk rotation x 5, heel cord stretches x 2    General Comments General comments (skin integrity, edema, etc.): VSS      Pertinent Vitals/Pain Pain Assessment: No/denies pain    Home Living                      Prior Function            PT Goals (current goals can now be found in the  care plan section) Progress towards PT goals: Progressing toward goals    Frequency    Min 2X/week      PT Plan Current plan remains appropriate    Co-evaluation              AM-PAC PT "6 Clicks" Mobility   Outcome Measure  Help needed turning from your back to your side while in a flat bed without using bedrails?: A Lot Help needed moving from lying on your back to sitting on the side of a flat bed without using bedrails?: A Lot Help needed moving to and from a bed to a chair (including a wheelchair)?: A Lot Help needed standing up from a chair using your arms (e.g., wheelchair or bedside chair)?: A Lot Help needed to walk in hospital room?: A  Lot Help needed climbing 3-5 steps with a railing? : Total 6 Click Score: 11    End of Session Equipment Utilized During Treatment: Gait belt Activity Tolerance: Patient limited by fatigue Patient left: in chair;with call bell/phone within reach;with chair alarm set   PT Visit Diagnosis: Other abnormalities of gait and mobility (R26.89);Other symptoms and signs involving the nervous system (R29.898)     Time: 7824-2353 PT Time Calculation (min) (ACUTE ONLY): 38 min  Charges:  $Gait Training: 8-22 mins $Therapeutic Exercise: 8-22 mins $Therapeutic Activity: 8-22 mins                     Yolanda Baker, [place] [phone] 02/06/2019    Yolanda Baker 02/06/2019, 2:32 PM

## 2019-02-07 ENCOUNTER — Encounter (HOSPITAL_COMMUNITY): Payer: Self-pay | Admitting: Internal Medicine

## 2019-02-07 MED ORDER — METOPROLOL TARTRATE 50 MG PO TABS
50.0000 mg | ORAL_TABLET | Freq: Once | ORAL | Status: AC
  Administered 2019-02-07: 12:00:00 50 mg via ORAL

## 2019-02-07 MED ORDER — FUROSEMIDE 20 MG PO TABS: 20.0000 mg | ORAL_TABLET | ORAL | 0 refills | Status: DC

## 2019-02-07 MED ORDER — METOPROLOL TARTRATE 25 MG PO TABS: 75.0000 mg | ORAL_TABLET | Freq: Two times a day (BID) | ORAL | 0 refills | Status: DC

## 2019-02-07 MED ORDER — SENNOSIDES-DOCUSATE SODIUM 8.6-50 MG PO TABS
1.0000 | ORAL_TABLET | Freq: Two times a day (BID) | ORAL | Status: DC
  Administered 2019-02-07: 12:00:00 1 via ORAL
  Filled 2019-02-07: qty 1

## 2019-02-07 MED ORDER — SENNOSIDES-DOCUSATE SODIUM 8.6-50 MG PO TABS: 1.0000 | ORAL_TABLET | Freq: Every day | ORAL | 0 refills | Status: DC

## 2019-02-07 MED ORDER — METOPROLOL TARTRATE 100 MG PO TABS: 100.0000 mg | ORAL_TABLET | Freq: Two times a day (BID) | ORAL | 0 refills | Status: DC

## 2019-02-07 NOTE — Progress Notes (Signed)
[facility] notified for transfer. [facility] medical neccescity form is in discharge packet left at nursing station.

## 2019-02-07 NOTE — Discharge Summary (Addendum)
Discharge Summary  Yolanda Baker TOI:[id number] DOB: Sep 08, 1923  PCP: Rodrigo Ran, MD  Admit date: 02/02/2019 Discharge date: 02/07/2019  Time spent: , more than 50% time spent on coordination of care.  Recommendations for Outpatient Follow-up:  1. F/u with SNF MD for hospital discharge follow up, repeat cbc/bmp at follow up 2. F/u with cardiology as scheduled 3. F/u with neurology Dr Pearlean Brownie  Discharge Diagnoses:  Active Hospital Problems   Diagnosis Date Noted  . Acute CHF (congestive heart failure) (HCC) 02/03/2019  . DNR (do not resuscitate) present on admission 02/04/2019  . Acute on chronic diastolic heart failure (HCC)   . Atrial fibrillation with rapid ventricular response (HCC) 02/03/2019  . Schizoaffective disorder (HCC) 01/15/2018  . Dementia (HCC) 06/09/2013  . HTN (hypertension) 06/04/2013    Resolved Hospital Problems  No resolved problems to display.    Discharge Condition: stable  Diet recommendation: heart healthy  Filed Weights   02/05/19 0513 02/06/19 0659 02/07/19 0555  Weight: 42.7 kg 43.3 kg 44.7 kg    History of present illness: (per admitting MD Dr. Toniann Fail)  PCP: Rodrigo Ran, MD  Patient coming from: Assisted living facility.  Chief Complaint: Shortness of breath.  History obtained from patient's daughter.  HPI: Yolanda Baker He is a 85 y.o. female with history of A. fib hypertension, stroke, dementia was brought to the ER after patient became acutely short of breath after supper last evening.  Patient did not have any chest pain fever chills or productive cough.  Has been experiencing palpitations last 24 hours.  ED Course: In the ER patient was found to be in A. fib with RVR and chest x-ray shows pleural effusion and which is concerning for CHF.  Labs show BNP of 335 high-sensitivity troponin of 9 creatinine 1.8 hemoglobin 12.9 platelets 234.  Patient's heart rate improved with 2 doses of IV metoprolol and was given Lasix 20 mg  IV.  Patient admitted for further management of acute CHF with A. fib with RVR.  COVID-19 test was negative. Hospital Course:  Principal Problem:   Acute CHF (congestive heart failure) (HCC) Active Problems:   HTN (hypertension)   Dementia (HCC)   Schizoaffective disorder (HCC)   Atrial fibrillation with rapid ventricular response (HCC)   Acute on chronic diastolic heart failure (HCC)   DNR (do not resuscitate) present on admission   Acute on chronic diastolic CHF -presents with sob/afib/rvr -Echocardiogram was performed on 02/03/2019 that demonstrated EF of 50-55% with LV morphology that could be consistent with cardiac amyloidosis. Diastolic parameters are indeterminate. RV pressure is mildly elevated at 32.6 mmHg.  -she treated with lasix 20 mg daily as per cardiology, she is discharged on lasix 20mg  qmwf due to poor oral intake, further lasix dose /frequency adjustment depends on oral intake improvement and renal function at hospital discharge follow up.  -improved with minimal diuresis. Cardiology input appreciated, she is to follow up with cardiology for afib, heart failure management and amyloidosis work up.  Afib/RVR with h/o CVA while off anticoagulation due to falls:  -rate better controlled on higher dose of metoprolol 100mg  bid per cardiology recommendation, patient blood pressure running low normal at discharge, will decrease lopressor to 75mg  bid -she is on eliquis -follow up with cardiology   Hypertension: bp low normal at discharge Amlodipine discontinued  metoprolol dose reduced from 100mg  bid to 75mg  bid Lasix changed from 20mg  daily to 20mg  qmwf. SNF MD to close monitor blood pressure, further blood pressure meds change per SNF  MD and cardiology   Ambulatory dysfunction:  Rigidity in lower extremity extensors per PT. Neurology consulted, recommend start low dose sinemet, and follow up with neurology Dr Pearlean BrownieSethi for parkinson's eval and management She is deemed not  appropriate to return to ALF , she is to be discharged to SNF  COPD not actively wheezing. Continue inhalers  Dementia on Razadyne.  Schizoaffective disorder on Geodon and Celexa and Ativan.  DVT Prophylaxis:eliquis  Code Status: partial ( no intubation)  Family Communication: patient   Disposition Plan: SNF    Consultants:  Cardiology  neurology  Social worker  PT  Procedures:  none  Antibiotics:  none   Discharge Exam: BP (!) 100/54 (BP Location: Left Arm)   Pulse (!) 111   Temp 98.4 F (36.9 C) (Oral)   Resp 16   Ht 5\' 2"  (1.575 m)   Wt 44.7 kg   SpO2 98%   BMI 18.02 kg/m    General:  Frail elderly, pleasant, NAD  Cardiovascular: IRRR  Respiratory: CTABL  Abdomen: Soft/ND/NT, positive BS  Musculoskeletal: No Edema  Neuro: alert, oriented to person, not to place   Discharge Instructions You were cared for by a hospitalist during your hospital stay. If you have any questions about your discharge medications or the care you received while you were in the hospital after you are discharged, you can call the unit and asked to speak with the hospitalist on call if the hospitalist that took care of you is not available. Once you are discharged, your primary care physician will handle any further medical issues. Please note that NO REFILLS for any discharge medications will be authorized once you are discharged, as it is imperative that you return to your primary care physician (or establish a relationship with a primary care physician if you do not have one) for your aftercare needs so that they can reassess your need for medications and monitor your lab values.  Discharge Instructions    Activity as tolerated - No restrictions   Complete by: As directed    Ambulatory referral to Neurology   Complete by: As directed    An appointment is requested in approximately: 4 weeks with Dr Pearlean BrownieSethi for parkinson's eval and management.   Call MD for:   difficulty breathing, headache or visual disturbances   Complete by: As directed    Call MD for:  extreme fatigue   Complete by: As directed    Diet - low sodium heart healthy   Complete by: As directed    Discharge instructions   Complete by: As directed    Follow up with Cardiology  for follow up on atrial fibrillation and possible amyloidosis. Check chemistry on 02/09/2019   Increase activity slowly   Complete by: As directed    Increase activity slowly   Complete by: As directed      Allergies as of 02/07/2019      Reactions   Flagyl [metronidazole]    Fingers get "numb"      Medication List    STOP taking these medications   Acetaminophen-Codeine 300-30 MG tablet Commonly known as: TYLENOL/CODEINE #3   amLODipine 5 MG tablet Commonly known as: NORVASC     TAKE these medications   acetaminophen 500 MG tablet Commonly known as: TYLENOL Take 1 tablet (500 mg total) by mouth 2 (two) times daily as needed for mild pain or moderate pain. What changed:   how much to take  when to take this   albuterol  108 (90 Base) MCG/ACT inhaler Commonly known as: VENTOLIN HFA Inhale 2 puffs into the lungs every 6 (six) hours as needed for wheezing or shortness of breath.   Anoro Ellipta 62.5-25 MCG/INH Aepb Generic drug: umeclidinium-vilanterol Inhale 1 puff into the lungs daily.   apixaban 2.5 MG Tabs tablet Commonly known as: ELIQUIS Take 1 tablet (2.5 mg total) by mouth 2 (two) times daily.   CALAZIME SKIN PROTECTANT EX Apply 1 application topically See admin instructions. Apply topically to affected area of coccyx and buttocks as needed for rash/irritation   carbidopa-levodopa 10-100 MG tablet Commonly known as: SINEMET IR Take 1 tablet by mouth 3 (three) times daily.   CERTAVITE SENIOR/ANTIOXIDANT PO Take 1 tablet by mouth daily.   chlorhexidine 0.12 % solution Commonly known as: PERIDEX Use as directed 15 mLs in the mouth or throat 2 (two) times daily. Swish  and spit   citalopram 10 MG tablet Commonly known as: CELEXA Take 30 mg by mouth daily.   CORICIDIN 2-325 MG Tabs Generic drug: Chlorpheniramine-APAP Take 1 tablet by mouth 2 (two) times daily as needed (congestion).   docusate sodium 100 MG capsule Commonly known as: COLACE Take 100 mg by mouth at bedtime.   furosemide 20 MG tablet Commonly known as: LASIX Take 1 tablet (20 mg total) by mouth every Monday, Wednesday, and Friday. Start taking on: February 09, 2019   galantamine 8 MG 24 hr capsule Commonly known as: RAZADYNE ER Take 8 mg by mouth daily with breakfast.   LORazepam 0.5 MG tablet Commonly known as: ATIVAN Take 1 tablet (0.5 mg total) by mouth 2 (two) times daily. What changed:   how much to take  how to take this  when to take this  additional instructions   METAMUCIL PO Take 5 mLs by mouth 2 (two) times daily. Mix in water and drink   metoprolol tartrate 25 MG tablet Commonly known as: LOPRESSOR Take 3 tablets (75 mg total) by mouth 2 (two) times daily. What changed:   how much to take  when to take this  additional instructions   polyethylene glycol 17 g packet Commonly known as: MIRALAX / GLYCOLAX Take 17 g by mouth daily as needed (constipation). Mix in 4-8 oz liquid and drink   potassium chloride SA 20 MEQ tablet Commonly known as: KLOR-CON Take 20 mEq by mouth every other day.   senna-docusate 8.6-50 MG tablet Commonly known as: Senokot-S Take 1 tablet by mouth at bedtime.   simvastatin 10 MG tablet Commonly known as: ZOCOR Take 1 tablet (10 mg total) by mouth daily at 6 PM.   Systane 0.4-0.3 % Soln Generic drug: Polyethyl Glycol-Propyl Glycol Place 1 drop into both eyes every hour as needed (dry eyes/irritation).   ziprasidone 60 MG capsule Commonly known as: GEODON Take 60 mg by mouth at bedtime.      Allergies  Allergen Reactions  . Flagyl [Metronidazole]     Fingers get "numb"    Contact information for follow-up  providers    Ronney Asters, NP. Go on 02/16/2019.   Specialty: Cardiology Why: @11am  for hospital follow up with Dr. Leonides Sake PA/NP Contact information: [address] [place] [address] [phone]        Micki Riley, MD Follow up in 1 month(s).   Specialties: Neurology, Radiology Why: for parkinson's eval and management Contact information: [address] [place] [address] [phone]            Contact  information for after-discharge care    Destination    [facility] Preferred SNF .   Service: Skilled Nursing Contact information: [address] [place] [address] [phone]                   The results of significant diagnostics from this hospitalization (including imaging, microbiology, ancillary and laboratory) are listed below for reference.    Significant Diagnostic Studies: CT ANGIO CHEST PE W OR WO CONTRAST  Result Date: 02/03/2019 CLINICAL DATA:  Shortness of breath. EXAM: CT ANGIOGRAPHY CHEST WITH CONTRAST TECHNIQUE: Multidetector CT imaging of the chest was performed using the standard protocol during bolus administration of intravenous contrast. Multiplanar CT image reconstructions and MIPs were obtained to evaluate the vascular anatomy. CONTRAST:  80mL OMNIPAQUE IOHEXOL 350 MG/ML SOLN COMPARISON:  Chest x-ray dated 02/02/2019 FINDINGS: Cardiovascular: Satisfactory opacification of the pulmonary arteries to the segmental level. No evidence of pulmonary embolism. Cardiomegaly. Aortic atherosclerosis. RV LV ratio is normal. No pericardial effusion. Mediastinum/Nodes: No enlarged mediastinal, hilar, or axillary lymph nodes. Thyroid gland, trachea, and esophagus demonstrate no significant findings. Lungs/Pleura: Small right pleural effusion. No infiltrates. No mass lesions. Pulmonary vascularity is normal. Upper Abdomen: No acute abnormality. Musculoskeletal: No chest  wall abnormality. No acute or significant osseous findings. Review of the MIP images confirms the above findings. IMPRESSION: 1. No pulmonary emboli. 2. Cardiomegaly. 3. Small right pleural effusion. 4. Aortic Atherosclerosis (ICD10-I70.0). Electronically Signed   By: Francene Boyers M.D.   On: 02/03/2019 13:52   DG Chest Port 1 View  Result Date: 02/02/2019 CLINICAL DATA:  Shortness of breath and AFib for 1 day, history of pneumonia and hypertension, nonsmoker EXAM: PORTABLE CHEST 1 VIEW COMPARISON:  Radiograph 06/04/2013 FINDINGS: Hazy interstitial opacity. There is a small right pleural effusion. No pneumothorax or left effusion is seen. No consolidative opacity is present. Cardiomegaly, slightly more pronounced than on comparison portable radiographs. Atherosclerotic plaque in the aorta. Degenerative changes are present in the imaged spine and shoulders. No acute osseous or soft tissue abnormality. IMPRESSION: 1. Hazy interstitial opacity with a small right pleural effusion and increasing cardiomegaly. Findings could reflect CHF with mild edema. 2.  Aortic Atherosclerosis (ICD10-I70.0). Electronically Signed   By: Kreg Shropshire M.D.   On: 02/02/2019 20:57   ECHOCARDIOGRAM COMPLETE  Result Date: 02/03/2019   ECHOCARDIOGRAM REPORT   Patient Name:   GRAY MAUGERI Date of Exam: 02/03/2019 Medical Rec #:  [id number]     Height:       62.0 in Accession #:    [id number]    Weight:       101.8 lb Date of Birth:  1923/03/30     BSA:          1.43 m Patient Age:    85 years      BP:           135/94 mmHg Patient Gender: F             HR:           95 bpm. Exam Location:  Inpatient Procedure: 2D Echo Indications:    428 CHF  History:        Patient has prior history of Echocardiogram examinations, most                 recent 06/30/2014. Stroke; Risk Factors:Hypertension. Dysrhythmia.  Sonographer:    Celene Skeen RDCS (AE) Referring Phys: [id number] Ready [facility]  Sonographer  Comments: limited mobility. off axis apical  windows IMPRESSIONS  1. Left ventricular ejection fraction, by visual estimation, is 50 to 55%. The left ventricle has low normal function. There is moderately increased left ventricular hypertrophy. The LV morphology could be consistent with cardiac amyloidosis.  2. Left ventricular diastolic parameters are indeterminate.  3. The left ventricle has no regional wall motion abnormalities.  4. Global right ventricle has normal systolic function.The right ventricular size is normal. No increase in right ventricular wall thickness.  5. Left atrial size was mildly dilated.  6. Right atrial size was mildly dilated.  7. Trivial pericardial effusion is present.  8. The mitral valve is normal in structure. Mild mitral valve regurgitation. No evidence of mitral stenosis.  9. The tricuspid valve is normal in structure. Tricuspid valve regurgitation is trivial. 10. The aortic valve is tricuspid. Aortic valve regurgitation is not visualized. No evidence of aortic valve sclerosis or stenosis. 11. The tricuspid regurgitant velocity is 2.48 m/s, and with an assumed right atrial pressure of 8 mmHg, the estimated right ventricular systolic pressure is mildly elevated at 32.6 mmHg. 12. The inferior vena cava is normal in size with <50% respiratory variability, suggesting right atrial pressure of 8 mmHg. FINDINGS  Left Ventricle: Left ventricular ejection fraction, by visual estimation, is 50 to 55%. The left ventricle has low normal function. The left ventricle has no regional wall motion abnormalities. The left ventricular internal cavity size was the left ventricle is normal in size. There is moderately increased left ventricular hypertrophy. Left ventricular diastolic parameters are indeterminate. Right Ventricle: The right ventricular size is normal. No increase in right ventricular wall thickness. Global RV systolic function is has normal systolic function. The tricuspid regurgitant velocity is 2.48 m/s, and with an assumed right  atrial pressure  of 8 mmHg, the estimated right ventricular systolic pressure is mildly elevated at 32.6 mmHg. Left Atrium: Left atrial size was mildly dilated. Right Atrium: Right atrial size was mildly dilated Pericardium: Trivial pericardial effusion is present. Mitral Valve: The mitral valve is normal in structure. No evidence of mitral valve stenosis by observation. Mild mitral valve regurgitation. Tricuspid Valve: The tricuspid valve is normal in structure. Tricuspid valve regurgitation is trivial. Aortic Valve: The aortic valve is tricuspid. Aortic valve regurgitation is not visualized. The aortic valve is structurally normal, with no evidence of sclerosis or stenosis. Pulmonic Valve: The pulmonic valve was normal in structure. Pulmonic valve regurgitation is not visualized. Aorta: The aortic root is normal in size and structure. Venous: The inferior vena cava is normal in size with less than 50% respiratory variability, suggesting right atrial pressure of 8 mmHg. IAS/Shunts: No atrial level shunt detected by color flow Doppler.  LEFT VENTRICLE PLAX 2D LVIDd:         3.30 cm LVIDs:         2.90 cm LV PW:         1.00 cm LV IVS:        1.00 cm LVOT diam:     1.80 cm LV SV:         12 ml LV SV Index:   8.42 LVOT Area:     2.54 cm  LEFT ATRIUM           Index LA diam:      3.30 cm 2.30 cm/m LA Vol (A4C): 20.1 ml 14.01 ml/m  AORTIC VALVE LVOT Vmax:   49.60 cm/s LVOT Vmean:  36.500 cm/s LVOT VTI:    0.085 m  AORTA Ao Root diam: 2.80 cm MITRAL VALVE                       TRICUSPID VALVE MV Area (PHT): 2.95 cm            TR Peak grad:   24.6 mmHg MV PHT:        74.53 msec          TR Vmax:        248.00 cm/s MV Decel Time: 257 msec MV E velocity: 88.70 cm/s 103 cm/s SHUNTS                                    Systemic VTI:  0.09 m                                    Systemic Diam: 1.80 cm  Marca Anconaalton Mclean MD Electronically signed by Marca Anconaalton Mclean MD Signature Date/Time: 02/03/2019/4:03:44 PM    Final      Microbiology: Recent Results (from the past 240 hour(s))  SARS CORONAVIRUS 2 (TAT 6-24 HRS) Nasopharyngeal Nasopharyngeal Swab     Status: None   Collection Time: 02/02/19 11:15 PM   Specimen: Nasopharyngeal Swab  Result Value Ref Range Status   SARS Coronavirus 2 NEGATIVE NEGATIVE Final    Comment: (NOTE) SARS-CoV-2 target nucleic acids are NOT DETECTED. The SARS-CoV-2 RNA is generally detectable in upper and lower respiratory specimens during the acute phase of infection. Negative results do not preclude SARS-CoV-2 infection, do not rule out co-infections with other pathogens, and should not be used as the sole basis for treatment or other patient management decisions. Negative results must be combined with clinical observations, patient history, and epidemiological information. The expected result is Negative. Fact Sheet for Patients: HairSlick.nohttps://www.fda.gov/media/138098/download Fact Sheet for Healthcare Providers: quierodirigir.comhttps://www.fda.gov/media/138095/download This test is not yet approved or cleared by the [place] FDA and  has been authorized for detection and/or diagnosis of SARS-CoV-2 by FDA under an Emergency Use Authorization (EUA). This EUA will remain  in effect (meaning this test can be used) for the duration of the COVID-19 declaration under Section 56 4(b)(1) of the Act, 21 U.S.C. section 360bbb-3(b)(1), unless the authorization is terminated or revoked sooner. Performed at [facility], [address] [place], [place] [address]   SARS CORONAVIRUS 2 (TAT 6-24 HRS) Nasopharyngeal Nasopharyngeal Swab     Status: None   Collection Time: 02/06/19  8:36 AM   Specimen: Nasopharyngeal Swab  Result Value Ref Range Status   SARS Coronavirus 2 NEGATIVE NEGATIVE Final    Comment: (NOTE) SARS-CoV-2 target nucleic acids are NOT DETECTED. The SARS-CoV-2 RNA is generally detectable in upper and lower respiratory specimens during the acute phase of infection.  Negative results do not preclude SARS-CoV-2 infection, do not rule out co-infections with other pathogens, and should not be used as the sole basis for treatment or other patient management decisions. Negative results must be combined with clinical observations, patient history, and epidemiological information. The expected result is Negative. Fact Sheet for Patients: HairSlick.nohttps://www.fda.gov/media/138098/download Fact Sheet for Healthcare Providers: quierodirigir.comhttps://www.fda.gov/media/138095/download This test is not yet approved or cleared by the [place] FDA and  has been authorized for detection and/or diagnosis of SARS-CoV-2 by FDA under an Emergency Use Authorization (EUA). This EUA will remain  in effect (meaning  this test can be used) for the duration of the COVID-19 declaration under Section 56 4(b)(1) of the Act, 21 U.S.C. section 360bbb-3(b)(1), unless the authorization is terminated or revoked sooner. Performed at [facility], [address], [place], [place] [address]      Labs: Basic Metabolic Panel: Recent Labs  Lab 02/02/19 2050 02/03/19 0843  NA 136 137  K 4.1 3.6  CL 100 98  CO2 26 27  GLUCOSE 113* 101*  BUN 19 13  CREATININE 0.84 0.76  CALCIUM 9.0 8.6*  MG  --  1.8   Liver Function Tests: Recent Labs  Lab 02/02/19 2050 02/03/19 0843  AST 31 35  ALT 15 11  ALKPHOS 87 73  BILITOT 0.4 1.1  PROT 7.6 7.3  ALBUMIN 3.4* 3.4*   No results for input(s): LIPASE, AMYLASE in the last 168 hours. No results for input(s): AMMONIA in the last 168 hours. CBC: Recent Labs  Lab 02/02/19 2050 02/03/19 0843  WBC 7.6 6.9  NEUTROABS 4.7 3.7  HGB 12.9 13.4  HCT 40.3 41.7  MCV 103.6* 103.2*  PLT 234 225   Cardiac Enzymes: No results for input(s): CKTOTAL, CKMB, CKMBINDEX, TROPONINI in the last 168 hours. BNP: BNP (last 3 results) Recent Labs    02/02/19 2200  BNP 335.0*    ProBNP (last 3 results) No results for input(s): PROBNP in the last 8760  hours.  CBG: No results for input(s): GLUCAP in the last 168 hours.     Signed:  Albertine Grates MD, PhD, FACP  [facility] 02/07/2019, 12:13 PM

## 2019-02-07 NOTE — Progress Notes (Signed)
Report given to nurse at [facility].

## 2019-02-07 NOTE — Progress Notes (Addendum)
TOC SW notified of medications adjusted, [facility] can be called to get pt on the list for transport.   Report will be called to [facility] at 1300.   Call will be placed to family, to notify if not completed by Joey,SW.   MD reminded of printed prescription requiring signature.

## 2019-02-07 NOTE — Progress Notes (Signed)
Page to MD  5C12, [place]. BP 98/59, prior to BP/HR meds, currently held; please advise.

## 2019-02-07 NOTE — TOC Transition Note (Signed)
Transition of Care [facility]) - CM/SW Discharge Note   Patient Details  Name: Yolanda Baker MRN: [id number] Date of Birth: 07-21-1923  Transition of Care [facility]) CM/SW Contact:  Oretha Milch, LCSW Phone Number: 02/07/2019, 11:48 AM   Clinical Narrative:  Patient will discharge to: [facility] Discharge date: 02/07/2019 Transport by: Corey Harold  Per MD patient is appropriate for discharge and will discharge to [facility] in room 27. RN, patient, patient's family, and facility have been notified of discharge. Assessment, FL-2, PASRR, and discharge summary sent to facillity. RN was provided with the following number for report: [phone]. PTAR will be used to transfer patient to the facility. CSW will continue to follow for any discharge supports. CSW will wait to hear back from RN prior to arranging PTAR transportation due to patient currently being hypotensive.     Final next level of care: [place] Barriers to Discharge: Barriers Resolved   Patient Goals and CMS Choice Patient states their goals for this hospitalization and ongoing recovery are:: for her to return to [facility] CMS Medicare.gov Compare Post Acute Care list provided to:: Patient Represenative (must comment) Choice offered to / list presented to : Adult Children  Discharge Placement   Existing PASRR number confirmed : 02/05/19          Patient chooses bed at: [facility] Patient to be transferred to facility by: [facility] Name of family member notified: [name] Patient and family notified of of transfer: 02/06/19  Discharge Plan and Services In-house Referral: Clinical Social Work Discharge Planning Services: CM Consult Post Acute Care Choice: Resumption of Svcs/PTA Provider                               Social Determinants of Health (SDOH) Interventions     Readmission Risk Interventions Readmission Risk Prevention Plan 02/04/2019   Transportation Screening Complete  PCP or Specialist Appt within 5-7 Days Complete  Home Care Screening Complete  Medication Review (RN CM) Complete  Some recent data might be hidden

## 2019-02-07 NOTE — Progress Notes (Signed)
Attempted to call report to facility at 1315, no nurse was available for report.  Left message with phone number for call back.

## 2019-02-07 NOTE — Progress Notes (Signed)
Call placed to CCMD to notify of telemetry monitoring d/c.   

## 2019-02-07 NOTE — Progress Notes (Addendum)
MD returned page earlier, requested manual BP for which this department does not have supplies, after tracking down a loaner manual cuff MD paged again with reading.  5C12 [place]. BP(manual) 100/54  11:58 AM     12:00PM  MD hold all but metoprolol, change 100mg  order to one time 50mg . AVS/DC Meds will be adjusted accordingly as well.

## 2019-02-07 NOTE — Progress Notes (Signed)
Patient sleeping during shift report.      

## 2019-02-09 ENCOUNTER — Telehealth: Payer: Self-pay | Admitting: Neurology

## 2019-02-09 NOTE — Telephone Encounter (Signed)
Dr. Rexene Alberts, would you be ok to see this patient?

## 2019-02-09 NOTE — Telephone Encounter (Signed)
Ok to see Dr Rexene Alberts if she agrees

## 2019-02-09 NOTE — Telephone Encounter (Signed)
Dr. Leonie Man, could you please advise? We've received an internal referral from the ED req eval and management for parkinson's. You have seen patient in the past (2018), but I wanted to check and see if you would recommend pt seen another provider for this?

## 2019-02-11 ENCOUNTER — Telehealth: Payer: Self-pay | Admitting: General Practice

## 2019-02-11 NOTE — Telephone Encounter (Signed)
Pts daughter [name] to come to her 02/16/19 appt with her.

## 2019-02-11 NOTE — Telephone Encounter (Signed)
New Message  Patient's daughter [name]) would like approval to accompany the patient to her appointment. States that patient has trouble walking, hearing, and understanding and needs her there with her. Please give patient's daughter a call back to confirm at [phone].

## 2019-02-13 NOTE — Progress Notes (Signed)
Cardiology Clinic Note   Patient Name: Yolanda Baker Date of Encounter: 02/16/2019  Primary Care Provider:  Rodrigo RanPerini, Mark, MD Primary Cardiologist:  Chilton Siiffany [place], MD  Patient Profile     Yolanda Baker 85 year old female presents today for follow-up of her hypertension, atrial flutter, CHF and atrial fibrillation.  Past Medical History    Past Medical History:  Diagnosis Date  . Anxiety   . Arthritis    neck  . Bruises easily   . Depression   . Dysrhythmia    hx A-FLUTTER after hip surg April 2015- take metoprolol  . Eczema   . Hip joint pain    RT  . Hypertension   . Memory loss   . Osteoporosis   . Pneumonia    august 2017  . Shortness of breath    with activity  . Stroke [facility](HCC)    "mini stroke" per MRI - no deficiets    Past Surgical History:  Procedure Laterality Date  . ABDOMINAL HYSTERECTOMY    . FRACTURE SURGERY Right 06/05/2013   hip  . HIP ARTHROPLASTY Right 10/03/2013   Procedure: CONVERSION OF PREVIOUS RIGHT HIP SURGERY TO A RIGHT TOTAL HIP ARTHROPLASTY;  Surgeon: Loanne DrillingFrank Aluisio V, MD;  Location: [facility] ORS;  Service: Orthopedics;  Laterality: Right;  . INTRAMEDULLARY (IM) NAIL INTERTROCHANTERIC Right 06/05/2013   Procedure: INTRAMEDULLARY (IM) NAIL INTERTROCHANTRIC;  Surgeon: Loanne DrillingFrank V Aluisio, MD;  Location: [facility] ORS;  Service: Orthopedics;  Laterality: Right;  . JOINT REPLACEMENT     left hip  . left hip Left 2008  . LEG SURGERY Right    x2    Allergies  Allergies  Allergen Reactions  . Flagyl [Metronidazole]     Fingers get "numb"    History of Present Illness    [name] have a past medical history of atrial fibrillation, hypertension, CVA, and dementia.  She was recently admitted to the hospital on 02/02/2019 and discharged on 02/07/2019.  She is a resident of an assisted living facility and was noted to be short of breath after her evening meal.  She was brought to the emergency department and found to be in atrial fibrillation with rapid  ventricular rate.  At that time her chest x-ray showed pleural effusion that was concerning for CHF.  Her BNP was 335 and a high-sensitivity troponin was 9.  Her creatinine was 1.8 with a hemoglobin of 12.9.  She showed significant improvement with 2 doses of IV metoprolol and 20 mg of IV Lasix.  Her COVID-19 test at that time was negative.  Her metoprolol tartrate 75 mg twice daily was continued at discharge and she was instructed to take furosemide 20 mg Monday Wednesday and Friday to be started on 02/09/2019.  She presents to the clinic today accompanied by the facility CNA and her daughter on the phone.  She states she is back to her normal daily activities.  She denies chest pain, shortness of breath, bleeding, and presyncope, syncope.  Her daughter asks about amyloidosis and we spent several minutes on the phone talking about the possibility of this diagnosis.  We also reviewed patient's echocardiogram and her diagnosis of atrial fibrillation.  At this point time the daughter does not wish to do any further amyloid testing.  I will check a BMP today and have her follow-up in 3 months.   Home Medications    Prior to Admission medications   Medication Sig Start Date End Date Taking? Authorizing Provider  acetaminophen (TYLENOL) 500  MG tablet Take 1 tablet (500 mg total) by mouth 2 (two) times daily as needed for mild pain or moderate pain. Patient taking differently: Take 1,000 mg by mouth every 6 (six) hours.  03/02/15   Leta Baptist, MD  albuterol (VENTOLIN HFA) 108 (90 Base) MCG/ACT inhaler Inhale 2 puffs into the lungs every 6 (six) hours as needed for wheezing or shortness of breath.    [provider]  apixaban (ELIQUIS) 2.5 MG TABS tablet Take 1 tablet (2.5 mg total) by mouth 2 (two) times daily. 07/01/14   Layne Benton, NP  carbidopa-levodopa (SINEMET IR) 10-100 MG tablet Take 1 tablet by mouth 3 (three) times daily. 02/06/19   Albertine Grates, MD  chlorhexidine (PERIDEX) 0.12 %  solution Use as directed 15 mLs in the mouth or throat 2 (two) times daily. Swish and spit    [provider]  Chlorpheniramine-APAP (CORICIDIN) 2-325 MG TABS Take 1 tablet by mouth 2 (two) times daily as needed (congestion).    [provider]  citalopram (CELEXA) 10 MG tablet Take 30 mg by mouth daily.  01/03/15   [provider]  docusate sodium (COLACE) 100 MG capsule Take 100 mg by mouth at bedtime.    [provider]  furosemide (LASIX) 20 MG tablet Take 1 tablet (20 mg total) by mouth every Monday, Wednesday, and Friday. 02/09/19   Albertine Grates, MD  galantamine (RAZADYNE ER) 8 MG 24 hr capsule Take 8 mg by mouth daily with breakfast.    [provider]  LORazepam (ATIVAN) 0.5 MG tablet Take 1 tablet (0.5 mg total) by mouth 2 (two) times daily. 02/04/19   Swayze, Ava, DO  metoprolol tartrate (LOPRESSOR) 25 MG tablet Take 3 tablets (75 mg total) by mouth 2 (two) times daily. 02/07/19 03/09/19  Albertine Grates, MD  Multiple Vitamins-Minerals (CERTAVITE SENIOR/ANTIOXIDANT PO) Take 1 tablet by mouth daily.     [provider]  Polyethyl Glycol-Propyl Glycol (SYSTANE) 0.4-0.3 % SOLN Place 1 drop into both eyes every hour as needed (dry eyes/irritation).    [provider]  polyethylene glycol (MIRALAX / GLYCOLAX) packet Take 17 g by mouth daily as needed (constipation). Mix in 4-8 oz liquid and drink    [provider]  potassium chloride SA (K-DUR,KLOR-CON) 20 MEQ tablet Take 20 mEq by mouth every other day.    [provider]  Psyllium (METAMUCIL PO) Take 5 mLs by mouth 2 (two) times daily. Mix in water and drink    [provider]  senna-docusate (SENOKOT-S) 8.6-50 MG tablet Take 1 tablet by mouth at bedtime. 02/07/19   Albertine Grates, MD  simvastatin (ZOCOR) 10 MG tablet Take 1 tablet (10 mg total) by mouth daily at 6 PM. 07/01/14   Layne Benton, NP  Skin Protectants, Misc. (CALAZIME SKIN PROTECTANT EX) Apply 1 application  topically See admin instructions. Apply topically to affected area of coccyx and buttocks as needed for rash/irritation    [provider]  umeclidinium-vilanterol (ANORO ELLIPTA) 62.5-25 MCG/INH AEPB Inhale 1 puff into the lungs daily.    [provider]  ziprasidone (GEODON) 60 MG capsule Take 60 mg by mouth at bedtime.  06/08/15   [provider]    Family History    Family History  Problem Relation Age of Onset  . Heart attack Father   . Hypertension Father   . Hypertension Sister   . Stroke Sister   . Hypertension Brother    She indicated that her  mother is deceased. She indicated that her father is deceased. She indicated that her sister is deceased. She indicated that the status of her brother is unknown.  Social History    Social History   Socioeconomic History  . Marital status: Widowed    Spouse name: Not on file  . Number of children: 3  . Years of education: 15yr colleg  . Highest education level: Not on file  Occupational History  . Occupation: Retired   Tobacco Use  . Smoking status: Never Smoker  . Smokeless tobacco: Never Used  Substance and Sexual Activity  . Alcohol use: No    Alcohol/week: 0.0 standard drinks  . Drug use: No  . Sexual activity: Never  Other Topics Concern  . Not on file  Social History Narrative   Drinks about 3 cups of coffee a day    Social Determinants of Health   Financial Resource Strain:   . Difficulty of Paying Living Expenses: Not on file  Food Insecurity:   . Worried About Programme researcher, broadcasting/film/video in the Last Year: Not on file  . Ran Out of Food in the Last Year: Not on file  Transportation Needs:   . Lack of Transportation (Medical): Not on file  . Lack of Transportation (Non-Medical): Not on file  Physical Activity:   . Days of Exercise per Week: Not on file  . Minutes of Exercise per Session: Not on file  Stress:   . Feeling of Stress : Not on file  Social Connections:   . Frequency of  Communication with Friends and Family: Not on file  . Frequency of Social Gatherings with Friends and Family: Not on file  . Attends Religious Services: Not on file  . Active Member of Clubs or Organizations: Not on file  . Attends Banker Meetings: Not on file  . Marital Status: Not on file  Intimate Partner Violence:   . Fear of Current or Ex-Partner: Not on file  . Emotionally Abused: Not on file  . Physically Abused: Not on file  . Sexually Abused: Not on file     Review of Systems    General:  No chills, fever, night sweats or weight changes.  Cardiovascular:  No chest pain, dyspnea on exertion, edema, orthopnea, palpitations, paroxysmal nocturnal dyspnea. Dermatological: No rash, lesions/masses Respiratory: No cough, dyspnea Urologic: No hematuria, dysuria Abdominal:   No nausea, vomiting, diarrhea, bright red blood per rectum, melena, or hematemesis Neurologic:  No visual changes, wkns, changes in mental status. All other systems reviewed and are otherwise negative except as noted above.  Physical Exam    VS:  BP 102/60   Pulse (!) 101   Ht 5\' 2"  (1.575 m)   Wt 94 lb 14.4 oz (43 kg)   BMI 17.36 kg/m  , BMI Body mass index is 17.36 kg/m. GEN: Well nourished, well developed, in no acute distress. HEENT: normal. Neck: Supple, no JVD, carotid bruits, or masses. Cardiac: Irregularly irregular, no murmurs, rubs, or gallops. No clubbing, cyanosis, edema.  Radials/DP/PT 2+ and equal bilaterally.  Respiratory:  Respirations regular and unlabored, clear to auscultation bilaterally. GI: Soft, nontender, nondistended, BS + x 4. MS: no deformity or atrophy. Skin: warm and dry, no rash. Neuro:  Strength and sensation are intact. Psych: Normal affect.  Accessory Clinical Findings    ECG personally reviewed by me today-atrial fibrillation with rapid ventricular response left axis deviation septal infarct undetermined age 74 bpm- No acute changes  EKG in  02/02/2019 Atrial fibrillation with PVCs 122 bpm  Echo 02/03/19: IMPRESSIONS   1. Left ventricular ejection fraction, by visual estimation, is 50 to 55%. The left ventricle has low normal function. There is moderately increased left ventricular hypertrophy. The LV morphology could be consistent with cardiac amyloidosis. 2. Left ventricular diastolic parameters are indeterminate. 3. The left ventricle has no regional wall motion abnormalities. 4. Global right ventricle has normal systolic function.The right ventricular size is normal. No increase in right ventricular wall thickness. 5. Left atrial size was mildly dilated. 6. Right atrial size was mildly dilated. 7. Trivial pericardial effusion is present. 8. The mitral valve is normal in structure. Mild mitral valve regurgitation. No evidence of mitral stenosis. 9. The tricuspid valve is normal in structure. Tricuspid valve regurgitation is trivial. 10. The aortic valve is tricuspid. Aortic valve regurgitation is not visualized. No evidence of aortic valve sclerosis or stenosis. 11. The tricuspid regurgitant velocity is 2.48 m/s, and with an assumed right atrial pressure of 8 mmHg, the estimated right ventricular systolic pressure is mildly elevated at 32.6 mmHg. 12. The inferior vena cava is normal in size with <50% respiratory variability, suggesting right atrial pressure of 8 mmHg.  Assessment & Plan   1.  Atrial fibrillation with rapid ventricular rate-EKG today shows atrial fibrillation with rapid response ventricular response 101 bpm Continue metoprolol tartrate 75 mg twice daily Continue apixaban 2.5 mg twice daily Avoid triggers caffeine, chocolate, EtOH, etc. Increase physical activity as tolerated Heart healthy low-sodium diet  Acute on chronic diastolic heart failure-appears euvolemic today.  Weight today 94 pounds.  No dyspnea today Continue furosemide 20 mg tablet Monday Wednesday Friday Continue potassium 20 mEq  every other day Repeat BMP Amyloidosis discussed with family.  They were educated about the condition and do not wish any further testing at this time.  Essential hypertension-BP today 102/60.  Well-controlled at facility. Continue metoprolol tartrate 75 mg twice daily Heart healthy low-sodium diet-salty 6 given Increase physical activity as tolerated  Hyperlipidemia-LDL 80 06/2014 Continue simvastatin 10 mg tablet daily Heart healthy low-sodium high-fiber diet Increase physical activity as tolerated  Disposition: Follow-up with Dr. Oval Linsey in 3 months.  Jossie Ng. [facility] [place] [address] Office [phone] Fax [phone]

## 2019-02-16 ENCOUNTER — Other Ambulatory Visit: Payer: Self-pay

## 2019-02-16 ENCOUNTER — Ambulatory Visit: Payer: Medicare Other | Admitting: General Practice

## 2019-02-16 ENCOUNTER — Encounter: Payer: Self-pay | Admitting: General Practice

## 2019-02-16 ENCOUNTER — Telehealth: Payer: Self-pay | Admitting: Neurology

## 2019-02-16 VITALS — BP 102/60 | HR 101 | Ht 62.0 in | Wt 94.9 lb

## 2019-02-16 DIAGNOSIS — I5033 Acute on chronic diastolic (congestive) heart failure: Secondary | ICD-10-CM | POA: Diagnosis not present

## 2019-02-16 DIAGNOSIS — I10 Essential (primary) hypertension: Secondary | ICD-10-CM

## 2019-02-16 DIAGNOSIS — E7800 Pure hypercholesterolemia, unspecified: Secondary | ICD-10-CM | POA: Diagnosis not present

## 2019-02-16 DIAGNOSIS — I4891 Unspecified atrial fibrillation: Secondary | ICD-10-CM

## 2019-02-16 NOTE — Patient Instructions (Signed)
Medication Instructions:  Your physician recommends that you continue on your current medications as directed. Please refer to the Current Medication list given to you today.  *If you need a refill on your cardiac medications before your next appointment, please call your pharmacy*  Lab Work: Your physician recommends that you return for lab work today: BMET  If you have labs (blood work) drawn today and your tests are completely normal, you will receive your results only by: Marland Kitchen MyChart Message (if you have MyChart) OR . A paper copy in the mail If you have any lab test that is abnormal or we need to change your treatment, we will call you to review the results.   Follow-Up: At [facility], you and your health needs are our priority.  As part of our continuing mission to provide you with exceptional heart care, we have created designated Provider Care Teams.  These Care Teams include your primary Cardiologist (physician) and Advanced Practice Providers (APPs -  Physician Assistants and Nurse Practitioners) who all work together to provide you with the care you need, when you need it.  Your next appointment:   3 month(s)  The format for your next appointment:   Either In Person or Virtual  Provider:   You may see Chilton Si, MD or one of the following Advanced Practice Providers on your designated Care Team:    Corine Shelter, PA-C  [place], [place]  Edd Fabian, [place]   Other Instructions  Atrial Fibrillation  Atrial fibrillation is a type of heartbeat that is irregular or fast (rapid). If you have this condition, your heart beats without any order. This makes it hard for your heart to pump blood in a normal [place]. Having this condition gives you more risk for stroke, heart failure, and other heart problems. Atrial fibrillation may start all of a sudden and then stop on its own, or it may become a long-lasting problem. What are the causes? This condition may be caused  by heart conditions, such as:  High blood pressure.  Heart failure.  Heart valve disease.  Heart surgery. Other causes include:  Pneumonia.  Obstructive sleep apnea.  Lung cancer.  Thyroid disease.  Drinking too much alcohol. Sometimes the cause is not known. What increases the risk? You are more likely to develop this condition if:  You smoke.  You are older.  You have diabetes.  You are overweight.  You have a family history of this condition.  You exercise often and hard. What are the signs or symptoms? Common symptoms of this condition include:  A feeling like your heart is beating very fast.  Chest pain.  Feeling short of breath.  Feeling light-headed or weak.  Getting tired easily. Follow these instructions at home: Medicines  Take over-the-counter and prescription medicines only as told by your doctor.  If your doctor gives you a blood-thinning medicine, take it exactly as told. Taking too much of it can cause bleeding. Taking too little of it does not protect you against clots. Clots can cause a stroke. Lifestyle      Do not use any tobacco products. These include cigarettes, chewing tobacco, and e-cigarettes. If you need help quitting, ask your doctor.  Do not drink alcohol.  Do not drink beverages that have caffeine. These include coffee, soda, and tea.  Follow diet instructions as told by your doctor.  Exercise regularly as told by your doctor. General instructions  If you have a condition that causes breathing to  stop for a short period of time (apnea), treat it as told by your doctor.  Keep a healthy weight. Do not use diet pills unless your doctor says they are safe for you. Diet pills may make heart problems worse.  Keep all follow-up visits as told by your doctor. This is important. Contact a doctor if:  You notice a change in the speed, rhythm, or strength of your heartbeat.  You are taking a blood-thinning medicine and  you see more bruising.  You get tired more easily when you move or exercise.  You have a sudden change in weight. Get help right away if:   You have pain in your chest or your belly (abdomen).  You have trouble breathing.  You have blood in your vomit, poop, or pee (urine).  You have any signs of a stroke. "BE FAST" is an easy Felty to remember the main warning signs: ? B - Balance. Signs are dizziness, sudden trouble walking, or loss of balance. ? E - Eyes. Signs are trouble seeing or a change in how you see. ? F - Face. Signs are sudden weakness or loss of feeling in the face, or the face or eyelid drooping on one side. ? A - Arms. Signs are weakness or loss of feeling in an arm. This happens suddenly and usually on one side of the body. ? S - Speech. Signs are sudden trouble speaking, slurred speech, or trouble understanding what people say. ? T - Time. Time to call emergency services. Write down what time symptoms started.  You have other signs of a stroke, such as: ? A sudden, very bad headache with no known cause. ? Feeling sick to your stomach (nausea). ? Throwing up (vomiting). ? Jerky movements you cannot control (seizure). These symptoms may be an emergency. Do not wait to see if the symptoms will go away. Get medical help right away. Call your local emergency services (911 in the [place] Do not drive yourself to the hospital. Summary  Atrial fibrillation is a type of heartbeat that is irregular or fast (rapid).  You are at higher risk of this condition if you smoke, are older, have diabetes, or are overweight.  Follow your doctor's instructions about medicines, diet, exercise, and follow-up visits.  Get help right away if you think that you have signs of a stroke. This information is not intended to replace advice given to you by your health care provider. Make sure you discuss any questions you have with your health care provider. Document Released: 11/22/2007 Document  Revised: 04/18/2017 Document Reviewed: 04/05/2017 Elsevier Patient Education  2020 [employer].

## 2019-02-16 NOTE — Telephone Encounter (Signed)
Ok to schedule pt?

## 2019-02-17 LAB — BASIC METABOLIC PANEL
BUN/Creatinine Ratio: 21 (ref 12–28)
BUN: 19 mg/dL (ref 10–36)
CO2: 21 mmol/L (ref 20–29)
Calcium: 8.9 mg/dL (ref 8.7–10.3)
Chloride: 102 mmol/L (ref 96–106)
Creatinine, Ser: 0.9 mg/dL (ref 0.57–1.00)
GFR calc Af Amer: 64 mL/min/{1.73_m2} (ref >59)
GFR calc non Af Amer: 55 mL/min/{1.73_m2} — ABNORMAL LOW (ref >59)
Glucose: 89 mg/dL (ref 65–99)
Potassium: 4.6 mmol/L (ref 3.5–5.2)
Sodium: 138 mmol/L (ref 134–144)

## 2019-03-10 NOTE — Telephone Encounter (Signed)
Created in Error

## 2019-03-25 ENCOUNTER — Telehealth: Payer: Self-pay | Admitting: Unknown Physician Specialty

## 2019-03-25 NOTE — Telephone Encounter (Signed)
Called to discuss with patient about Covid symptoms and the use of bamlanivimab, a monoclonal antibody infusion for those with mild to moderate Covid symptoms and at a high risk of hospitalization.  Pt is not qualified at this time due to lack of symptoms of Covid 19.  They will call me if she develops symptoms

## 2019-04-01 ENCOUNTER — Ambulatory Visit: Payer: Self-pay | Admitting: Neurology

## 2019-04-05 ENCOUNTER — Emergency Department (HOSPITAL_COMMUNITY)
Admission: EM | Admit: 2019-04-05 | Discharge: 2019-04-05 | Disposition: A | Discharge status: Home / Self Care | Payer: Medicare PPO | Attending: Emergency Medicine | Admitting: Emergency Medicine

## 2019-04-05 ENCOUNTER — Encounter (HOSPITAL_COMMUNITY): Payer: Self-pay | Admitting: Emergency Medicine

## 2019-04-05 ENCOUNTER — Emergency Department (HOSPITAL_COMMUNITY): Payer: Medicare PPO

## 2019-04-05 DIAGNOSIS — R040 Epistaxis: Secondary | ICD-10-CM | POA: Insufficient documentation

## 2019-04-05 DIAGNOSIS — I110 Hypertensive heart disease with heart failure: Secondary | ICD-10-CM | POA: Diagnosis not present

## 2019-04-05 DIAGNOSIS — I5032 Chronic diastolic (congestive) heart failure: Secondary | ICD-10-CM | POA: Diagnosis not present

## 2019-04-05 DIAGNOSIS — Z79899 Other long term (current) drug therapy: Secondary | ICD-10-CM | POA: Insufficient documentation

## 2019-04-05 DIAGNOSIS — Z7901 Long term (current) use of anticoagulants: Secondary | ICD-10-CM | POA: Diagnosis not present

## 2019-04-05 DIAGNOSIS — Z96643 Presence of artificial hip joint, bilateral: Secondary | ICD-10-CM | POA: Insufficient documentation

## 2019-04-05 LAB — URINALYSIS, ROUTINE W REFLEX MICROSCOPIC
Bilirubin Urine: NEGATIVE
Glucose, UA: NEGATIVE mg/dL
Hgb urine dipstick: NEGATIVE
Ketones, ur: NEGATIVE mg/dL
Leukocytes,Ua: NEGATIVE
Nitrite: NEGATIVE
Protein, ur: NEGATIVE mg/dL
Specific Gravity, Urine: 1.008 (ref 1.005–1.030)
pH: 8 (ref 5.0–8.0)

## 2019-04-05 LAB — CBC WITH DIFFERENTIAL/PLATELET
Abs Immature Granulocytes: 0.06 10*3/uL (ref 0.00–0.07)
Basophils Absolute: 0.1 10*3/uL (ref 0.0–0.1)
Basophils Relative: 1 %
Eosinophils Absolute: 0.2 10*3/uL (ref 0.0–0.5)
Eosinophils Relative: 2 %
HCT: 39 % (ref 36.0–46.0)
Hemoglobin: 12.4 g/dL (ref 12.0–15.0)
Immature Granulocytes: 1 %
Lymphocytes Relative: 26 %
Lymphs Abs: 2.3 10*3/uL (ref 0.7–4.0)
MCH: 33.3 pg (ref 26.0–34.0)
MCHC: 31.8 g/dL (ref 30.0–36.0)
MCV: 104.8 fL — ABNORMAL HIGH (ref 80.0–100.0)
Monocytes Absolute: 0.8 10*3/uL (ref 0.1–1.0)
Monocytes Relative: 9 %
Neutro Abs: 5.4 10*3/uL (ref 1.7–7.7)
Neutrophils Relative %: 61 %
Platelets: 222 10*3/uL (ref 150–400)
RBC: 3.72 MIL/uL — ABNORMAL LOW (ref 3.87–5.11)
RDW: 13.8 % (ref 11.5–15.5)
WBC: 8.8 10*3/uL (ref 4.0–10.5)
nRBC: 0 % (ref 0.0–0.2)

## 2019-04-05 LAB — COMPREHENSIVE METABOLIC PANEL
ALT: 15 U/L (ref 0–44)
AST: 32 U/L (ref 15–41)
Albumin: 3 g/dL — ABNORMAL LOW (ref 3.5–5.0)
Alkaline Phosphatase: 92 U/L (ref 38–126)
Anion gap: 10 (ref 5–15)
BUN: 14 mg/dL (ref 8–23)
CO2: 25 mmol/L (ref 22–32)
Calcium: 8.4 mg/dL — ABNORMAL LOW (ref 8.9–10.3)
Chloride: 105 mmol/L (ref 98–111)
Creatinine, Ser: 0.65 mg/dL (ref 0.44–1.00)
GFR calc Af Amer: >60 mL/min (ref >60)
GFR calc non Af Amer: >60 mL/min (ref >60)
Glucose, Bld: 91 mg/dL (ref 70–99)
Potassium: 3.5 mmol/L (ref 3.5–5.1)
Sodium: 140 mmol/L (ref 135–145)
Total Bilirubin: 0.6 mg/dL (ref 0.3–1.2)
Total Protein: 7 g/dL (ref 6.5–8.1)

## 2019-04-05 LAB — TYPE AND SCREEN
ABO/RH(D): O POS
Antibody Screen: NEGATIVE

## 2019-04-05 LAB — ABO/RH: ABO/RH(D): O POS

## 2019-04-05 MED ORDER — SODIUM CHLORIDE 0.9 % IV BOLUS
500.0000 mL | Freq: Once | INTRAVENOUS | Status: AC
  Administered 2019-04-05: 500 mL via INTRAVENOUS

## 2019-04-05 MED ORDER — SODIUM CHLORIDE 0.9 % IV SOLN: INTRAVENOUS | Status: DC

## 2019-04-05 MED ORDER — CEPHALEXIN 500 MG PO CAPS: 500.0000 mg | ORAL_CAPSULE | Freq: Three times a day (TID) | ORAL | 0 refills | Status: DC

## 2019-04-05 MED ORDER — OXYMETAZOLINE HCL 0.05 % NA SOLN
1.0000 | Freq: Once | NASAL | Status: AC
  Administered 2019-04-05: 1 via NASAL
  Filled 2019-04-05: qty 30

## 2019-04-05 NOTE — ED Provider Notes (Signed)
[facility] EMERGENCY DEPARTMENT Provider Note   CSN: [id number] Arrival date & time: 04/05/19  1352     History Chief Complaint  Patient presents with  . Epistaxis    Yolanda Baker is a 86 y.o. female.  86 year old female who presents with epistaxis times several days.  According to EMS they have been called to see her yesterday and she refused transport.  She is on Eliquis and has had increasing weakness.  She herself denies any dyspnea but does agree with that she feels weak all over and is not been focal.  Feels drainage in back of her throat.  Denies any abdominal or chest discomfort.  EMS gave patient Afrin and transported her here        Past Medical History:  Diagnosis Date  . Anxiety   . Arthritis    neck  . Bruises easily   . Depression   . Dysrhythmia    hx A-FLUTTER after hip surg April 2015- take metoprolol  . Eczema   . Hip joint pain    RT  . Hypertension   . Memory loss   . Osteoporosis   . Pneumonia    august 2017  . Shortness of breath    with activity  . Stroke [facility])    "mini stroke" per MRI - no deficiets     Patient Active Problem List   Diagnosis Date Noted  . DNR (do not resuscitate) present on admission 02/04/2019  . Acute on chronic diastolic heart failure ([place])   . Acute CHF (congestive heart failure) ([place]) 02/03/2019  . Atrial fibrillation with rapid ventricular response ([place]) 02/03/2019  . GAD (generalized anxiety disorder) 01/15/2018  . Schizoaffective disorder ([place]) 01/15/2018  . Hyperlipidemia LDL goal <70 07/01/2014  . Constipation chronic 07/01/2014  . Acute CVA (cerebrovascular accident) ([place])   . Acute blood loss anemia 10/12/2013  . Postoperative anemia due to acute blood loss 10/04/2013  . Postop Transfusion 10/04/2013  . Closed fracture of femur with nonunion 10/03/2013  . Femur fracture, right ([place]) 10/03/2013  . Closed femur fracture ([place]) 10/03/2013  . UTI (urinary tract infection) 06/23/2013  .  Dementia ([place]) 06/09/2013  . Anxiety 06/09/2013  . Atrial flutter ([place]) 06/07/2013  . Hip fracture ([place]) 06/04/2013  . Depression 06/04/2013  . Anxiety state 06/04/2013  . HTN (hypertension) 06/04/2013  . Closed right hip fracture ([place]) 06/04/2013    Past Surgical History:  Procedure Laterality Date  . ABDOMINAL HYSTERECTOMY    . FRACTURE SURGERY Right 06/05/2013   hip  . HIP ARTHROPLASTY Right 10/03/2013   Procedure: CONVERSION OF PREVIOUS RIGHT HIP SURGERY TO A RIGHT TOTAL HIP ARTHROPLASTY;  Surgeon: Gearlean Alf, MD;  Location: [facility] ORS;  Service: Orthopedics;  Laterality: Right;  . INTRAMEDULLARY (IM) NAIL INTERTROCHANTERIC Right 06/05/2013   Procedure: INTRAMEDULLARY (IM) NAIL INTERTROCHANTRIC;  Surgeon: Gearlean Alf, MD;  Location: [facility] ORS;  Service: Orthopedics;  Laterality: Right;  . JOINT REPLACEMENT     left hip  . left hip Left 2008  . LEG SURGERY Right    x2     OB History   No obstetric history on file.     Family History  Problem Relation Age of Onset  . Heart attack Father   . Hypertension Father   . Hypertension Sister   . Stroke Sister   . Hypertension Brother     Social History   Tobacco Use  . Smoking status: Never Smoker  . Smokeless tobacco:  Never Used  Substance Use Topics  . Alcohol use: No    Alcohol/week: 0.0 standard drinks  . Drug use: No    Home Medications Prior to Admission medications   Medication Sig Start Date End Date Taking? Authorizing Provider  acetaminophen (TYLENOL) 500 MG tablet Take 1 tablet (500 mg total) by mouth 2 (two) times daily as needed for mild pain or moderate pain. Patient taking differently: Take 1,000 mg by mouth every 6 (six) hours.  03/02/15   Leta Baptist, MD  albuterol (VENTOLIN HFA) 108 (90 Base) MCG/ACT inhaler Inhale 2 puffs into the lungs every 6 (six) hours as needed for wheezing or shortness of breath.    [provider]  apixaban (ELIQUIS) 2.5 MG TABS tablet Take 1 tablet (2.5 mg  total) by mouth 2 (two) times daily. 07/01/14   Layne Benton, NP  carbidopa-levodopa (SINEMET IR) 10-100 MG tablet Take 1 tablet by mouth 3 (three) times daily. 02/06/19   Albertine Grates, MD  chlorhexidine (PERIDEX) 0.12 % solution Use as directed 15 mLs in the mouth or throat 2 (two) times daily. Swish and spit    [provider]  Chlorpheniramine-APAP (CORICIDIN) 2-325 MG TABS Take 1 tablet by mouth 2 (two) times daily as needed (congestion).    [provider]  citalopram (CELEXA) 10 MG tablet Take 30 mg by mouth daily.  01/03/15   [provider]  docusate sodium (COLACE) 100 MG capsule Take 100 mg by mouth at bedtime.    [provider]  furosemide (LASIX) 20 MG tablet Take 1 tablet (20 mg total) by mouth every Monday, Wednesday, and Friday. 02/09/19   Albertine Grates, MD  galantamine (RAZADYNE ER) 8 MG 24 hr capsule Take 8 mg by mouth daily with breakfast.    [provider]  LORazepam (ATIVAN) 0.5 MG tablet Take 1 tablet (0.5 mg total) by mouth 2 (two) times daily. 02/04/19   Swayze, Ava, DO  metoprolol tartrate (LOPRESSOR) 25 MG tablet Take 3 tablets (75 mg total) by mouth 2 (two) times daily. 02/07/19 03/09/19  Albertine Grates, MD  Multiple Vitamins-Minerals (CERTAVITE SENIOR/ANTIOXIDANT PO) Take 1 tablet by mouth daily.     [provider]  Polyethyl Glycol-Propyl Glycol (SYSTANE) 0.4-0.3 % SOLN Place 1 drop into both eyes every hour as needed (dry eyes/irritation).    [provider]  polyethylene glycol (MIRALAX / GLYCOLAX) packet Take 17 g by mouth daily as needed (constipation). Mix in 4-8 oz liquid and drink    [provider]  potassium chloride SA (K-DUR,KLOR-CON) 20 MEQ tablet Take 20 mEq by mouth every other day.    [provider]  Psyllium (METAMUCIL PO) Take 5 mLs by mouth 2 (two) times daily. Mix in water and drink    [provider]  senna-docusate (SENOKOT-S) 8.6-50 MG tablet Take 1 tablet by mouth at bedtime.  02/07/19   Albertine Grates, MD  simvastatin (ZOCOR) 10 MG tablet Take 1 tablet (10 mg total) by mouth daily at 6 PM. 07/01/14   Layne Benton, NP  Skin Protectants, Misc. (CALAZIME SKIN PROTECTANT EX) Apply 1 application topically See admin instructions. Apply topically to affected area of coccyx and buttocks as needed for rash/irritation    [provider]  umeclidinium-vilanterol (ANORO ELLIPTA) 62.5-25 MCG/INH AEPB Inhale 1 puff into the lungs daily.    [provider]  ziprasidone (GEODON) 60 MG capsule Take 60 mg by mouth at bedtime.  06/08/15   [provider]  Allergies    Flagyl [metronidazole]  Review of Systems   Review of Systems  All other systems reviewed and are negative.   Physical Exam Updated Vital Signs There were no vitals taken for this visit.  Physical Exam Vitals and nursing note reviewed.  Constitutional:      General: She is not in acute distress.    Appearance: Normal appearance. She is well-developed. She is not toxic-appearing.  HENT:     Head: Normocephalic and atraumatic.     Nose:     Right Nostril: Epistaxis present.  Eyes:     General: Lids are normal.     Conjunctiva/sclera: Conjunctivae normal.     Pupils: Pupils are equal, round, and reactive to light.  Neck:     Thyroid: No thyroid mass.     Trachea: No tracheal deviation.  Cardiovascular:     Rate and Rhythm: Normal rate and regular rhythm.     Heart sounds: Normal heart sounds. No murmur. No gallop.   Pulmonary:     Effort: Pulmonary effort is normal. No respiratory distress.     Breath sounds: Normal breath sounds. No stridor. No decreased breath sounds, wheezing, rhonchi or rales.  Abdominal:     General: Bowel sounds are normal. There is no distension.     Palpations: Abdomen is soft.     Tenderness: There is no abdominal tenderness. There is no rebound.  Musculoskeletal:        General: No tenderness. Normal range of motion.     Cervical back: Normal range of  motion and neck supple.  Skin:    General: Skin is warm and dry.     Findings: No abrasion or rash.  Neurological:     Mental Status: She is alert and oriented to person, place, and time.     GCS: GCS eye subscore is 4. GCS verbal subscore is 5. GCS motor subscore is 6.     Cranial Nerves: No cranial nerve deficit.     Sensory: No sensory deficit.  Psychiatric:        Attention and Perception: Attention normal.        Mood and Affect: Affect is flat.        Speech: Speech is delayed.        Behavior: Behavior is withdrawn.     ED Results / Procedures / Treatments   Labs (all labs ordered are listed, but only abnormal results are displayed) Labs Reviewed  COMPREHENSIVE METABOLIC PANEL  CBC WITH DIFFERENTIAL/PLATELET  URINALYSIS, ROUTINE W REFLEX MICROSCOPIC  TYPE AND SCREEN    EKG None  Radiology No results found.  Procedures .Epistaxis Management  Date/Time: 04/05/2019 2:55 PM Performed by: Lorre Nick, MD Authorized by: Lorre Nick, MD   Consent:    Consent obtained:  Verbal   Consent given by:  Patient   Risks discussed:  Bleeding   Alternatives discussed:  No treatment Anesthesia (see MAR for exact dosages):    Anesthesia method:  None Procedure details:    Treatment site:  Unable to specify   Treatment method:  Nasal tampon   Treatment complexity:  Extensive   Treatment episode: initial   Post-procedure details:    Assessment:  Bleeding decreased   Patient tolerance of procedure:  Tolerated well, no immediate complications   (including critical care time)  Medications Ordered in ED Medications  0.9 %  sodium chloride infusion (has no administration in time range)    ED Course  I have reviewed the  triage vital signs and the nursing notes.  Pertinent labs & imaging results that were available during my care of the patient were reviewed by me and considered in my medical decision making (see chart for details).    MDM Rules/Calculators/A&P                       No evidence of repeat bleeding here.  Laboratory studies are reassuring here.  Urinalysis without infection.  Chest x-ray without acute findings.  Patient's at bedside and states that patient is at her baseline.  Will prescribe patient Keflex and have facility take out packing in 48 hours. Final Clinical Impression(s) / ED Diagnoses Final diagnoses:  None    Rx / DC Orders ED Discharge Orders    None       Lorre Nick, MD 04/05/19 1736

## 2019-04-05 NOTE — ED Notes (Signed)
The pt s daughter was at home she was never here.. I talked to her candy reavis  I gave her an [place]  She reports that she is out of town.  She also reports that her brother is supposed to arrive sometime this afternoon

## 2019-04-05 NOTE — ED Notes (Signed)
Son at the bedside

## 2019-04-05 NOTE — ED Notes (Signed)
Call made to [facility]  Report given   ptar has been called to transport her back there  The son is talking to the daughter after speaking to the edp[ dr [name]

## 2019-04-05 NOTE — ED Notes (Signed)
The pt left with ptar  Nose rocket still in place  No nose bleed

## 2019-04-05 NOTE — ED Triage Notes (Signed)
Pt arrives from [facility] with c/c of nose bleed that started 3 days ago but pt did not want to come to hospital.EMS did come out yesterday but pt refused to come. Pt continues to have intermittent nose bleeds this morning and staff reports she was more lethargic.   Pt is on blood thinner.   CBG 87 RR20 98.1temp 156/72 BP

## 2019-04-05 NOTE — ED Notes (Signed)
No active nose bleed at present

## 2019-04-05 NOTE — Discharge Instructions (Signed)
Remove packing in 48 hours

## 2019-04-05 NOTE — ED Notes (Addendum)
Someone called back from triage approx 20 minutes ago asking if thge daughter could come back  Now I went out to find her and I could not find her or anyone that had called back  To come see the pt  Message left with security  That the daughter could come back whenever she came back if she does

## 2019-04-15 ENCOUNTER — Other Ambulatory Visit: Payer: Self-pay

## 2019-04-15 ENCOUNTER — Encounter: Payer: Self-pay | Admitting: Neurology

## 2019-04-15 ENCOUNTER — Ambulatory Visit: Payer: Medicare PPO | Admitting: Neurology

## 2019-04-15 VITALS — BP 116/62 | HR 61 | Temp 97.0°F | Wt 93.0 lb

## 2019-04-15 DIAGNOSIS — G2119 Other drug induced secondary parkinsonism: Secondary | ICD-10-CM | POA: Diagnosis not present

## 2019-04-15 NOTE — Patient Instructions (Signed)
As discussed, I am not convinced that you have Parkinson's disease.  You may have drug-induced parkinsonism from taking the Geodon for so many years.  I would recommend that you taper off the Sinemet at this point, take 1 pill twice daily for a week, then 1 pill once daily for 1 week and then stop.  It is possible that the Sinemet has caused you to be more sleepy during the day, And I would not favor that you continue with it at this time. Follow-up with Dr. Pearlean Brownie in stroke clinic if you have any additional concerns regarding stroke related management.

## 2019-04-15 NOTE — Progress Notes (Signed)
Subjective:    Patient ID: Yolanda Baker is a 86 y.o. female.  HPI     Yolanda Foley, MD, PhD [facility] [address], [address] [address] [place], [place] [address]  I saw patient Yolanda Baker as a referral for concern for parkinsonism, referral is from the hospitalist service after recent hospitalization.  The patient is accompanied by her daughter today.  She is a 86 year old right-handed woman with an underlying complex medical history of congestive heart failure, A. fib, mood disorder, memory loss, hypertension, stroke, arthritis, osteoporosis, and history of pneumonia, who was recently hospitalized in December 2020 with acute CHF.  She had presented with shortness of breath and in the emergency room was found to have a fib with rapid ventricular response.  I reviewed the hospital records.  She had cardiac work-up.  She was noted to have some rigidity.  She was started on Sinemet 10-100 mg strength 1 pill 3 times daily for concern for parkinsonism. The patient is at [facility].  She is getting physical therapy and occupational therapy and probably also speech therapy per daughter.  Daughter has not noticed any improvement in patient's mobility since she was placed on Sinemet.  If anything, since December 2020 patient is more lethargic.  In fact, during today's visit she mostly is asleep.  She is arousable by verbal commands.  Patient mobilizes some with a walker.  She is in a wheelchair today, they did not bring a walker.  Of note, patient has been on Geodon for years.  She follows with psychiatry.  She may have been on Risperdal in the past but has been on Geodon for many years, may be as many as 10 per daughter's report.  The Geodon dose was reduced at 1 point by her psychiatrist.   I have seen her several years ago once, she has been followed in the interim by Yolanda Baker for her stroke history.  Previously:   09/21/2014: Yolanda Baker is an 86 year old right-handed  woman with an underlying medical history of hypertension, arthritis, depression, anxiety, atrial flutter, right hip fracture, status post surgery in April 2015, status post left hip replacement surgery, who presents for initial consultation after a recent hospitalization for acute stroke. The patient is accompanied by her oldest daughter, Yolanda Baker, today. She presented to the emergency room on 06/29/2014 with sudden onset of slurred speech. At baseline, the patient walks with a walker and requires assistance for some of her ADLs. A code stroke was called and TPA was administered. The patient was transferred to the neuro ICU for monitoring and further evaluation and treatment. I reviewed the hospital records. MRI showed left MCA territory embolic infarcts. MRA head was unremarkable. Carotid Doppler studies showed left-sided 40-59% ICA stenosis, echocardiogram showed no source of embolus. Hemoglobin A1c was 5.9. History of atrial flutter prompted starting the patient on Eliquis. She was residing at [facility] assisted living. She was discharged on 07/01/2014 with home health physical therapy. Low-dose statin was added because of LDL slightly above goal at 80. I personally reviewed her brain MRI from 06/30/2014 and agree with the findings: 1. Two small acute nonhemorrhagic infarcts in the left MCA territory, present along the mid sylvian fissure and parietal lobe.  2. Chronic small vessel disease and remote subcortical lacunar infarcts on the left. 3. Generalized brain atrophy, progressed from 2012. 4. Mild intracranial atherosclerosis. No acute intracranial arterial finding or treatable stenosis.   Of note, she has been on Geodon for  years, she is followed by Yolanda Baker at [facility]. Her husband passed away in 06/24/2022. She has been in Assisted Living since 2015. She has three grown children and 6 grandchildren and 8 great grandchildren.    She now mobilizes with a non-wheeled walker, she has no  problems swallowing and speech has recovered per patient and her daughter feels, she is doing rather well.    She is in a liftchair a lot, she goes to the sit-down exercise classes about 5 times a week.    She does not snore. No apneas reported by daughter. She has been on Galantamine for some years.   Her Past Medical History Is Significant For: Past Medical History:  Diagnosis Date  . Anxiety   . Arthritis    neck  . Bruises easily   . Depression   . Dysrhythmia    hx A-FLUTTER after hip surg April 2015- take metoprolol  . Eczema   . Hip joint pain    RT  . Hypertension   . Memory loss   . Osteoporosis   . Pneumonia    august 2017  . Shortness of breath    with activity  . Stroke [facility])    "mini stroke" per MRI - no deficiets     Her Past Surgical History Is Significant For: Past Surgical History:  Procedure Laterality Date  . ABDOMINAL HYSTERECTOMY    . FRACTURE SURGERY Right 06/05/2013   hip  . HIP ARTHROPLASTY Right 10/03/2013   Procedure: CONVERSION OF PREVIOUS RIGHT HIP SURGERY TO A RIGHT TOTAL HIP ARTHROPLASTY;  Surgeon: Loanne Drilling, MD;  Location: [facility] ORS;  Service: Orthopedics;  Laterality: Right;  . INTRAMEDULLARY (IM) NAIL INTERTROCHANTERIC Right 06/05/2013   Procedure: INTRAMEDULLARY (IM) NAIL INTERTROCHANTRIC;  Surgeon: Loanne Drilling, MD;  Location: [facility];  Service: Orthopedics;  Laterality: Right;  . JOINT REPLACEMENT     left hip  . left hip Left 2008  . LEG SURGERY Right    x2    Her Family History Is Significant For: Family History  Problem Relation Age of Onset  . Heart attack Father   . Hypertension Father   . Hypertension Sister   . Stroke Sister   . Hypertension Brother     Her Social History Is Significant For: Social History   Socioeconomic History  . Marital status: Widowed    Spouse name: Not on file  . Number of children: 3  . Years of education: 47yr colleg  . Highest education level: Not on file  Occupational History  .  Occupation: Retired   Tobacco Use  . Smoking status: Never Smoker  . Smokeless tobacco: Never Used  Substance and Sexual Activity  . Alcohol use: No    Alcohol/week: 0.0 standard drinks  . Drug use: No  . Sexual activity: Never  Other Topics Concern  . Not on file  Social History Narrative   Drinks about 3 cups of coffee a day    Social Determinants of Health   Financial Resource Strain:   . Difficulty of Paying Living Expenses: Not on file  Food Insecurity:   . Worried About Programme researcher, broadcasting/film/video in the Last Year: Not on file  . Ran Out of Food in the Last Year: Not on file  Transportation Needs:   . Lack of Transportation (Medical): Not on file  . Lack of Transportation (Non-Medical): Not on file  Physical Activity:   . Days of Exercise per Week: Not on file  .  Minutes of Exercise per Session: Not on file  Stress:   . Feeling of Stress : Not on file  Social Connections:   . Frequency of Communication with Friends and Family: Not on file  . Frequency of Social Gatherings with Friends and Family: Not on file  . Attends Religious Services: Not on file  . Active Member of Clubs or Organizations: Not on file  . Attends Archivist Meetings: Not on file  . Marital Status: Not on file    Her Allergies Are:  Allergies  Allergen Reactions  . Flagyl [Metronidazole]     Fingers get "numb"  :   Her Current Medications Are:  Outpatient Encounter Medications as of 04/15/2019  Medication Sig  . acetaminophen (TYLENOL) 500 MG tablet Take 1 tablet (500 mg total) by mouth 2 (two) times daily as needed for mild pain or moderate pain. (Patient taking differently: Take 1,000 mg by mouth every 6 (six) hours. )  . albuterol (VENTOLIN HFA) 108 (90 Base) MCG/ACT inhaler Inhale 2 puffs into the lungs every 6 (six) hours as needed for wheezing or shortness of breath.  Marland Kitchen amLODipine (NORVASC) 5 MG tablet Take 5 mg by mouth daily.  Marland Kitchen apixaban (ELIQUIS) 2.5 MG TABS tablet Take 1 tablet  (2.5 mg total) by mouth 2 (two) times daily.  . carbidopa-levodopa (SINEMET IR) 10-100 MG tablet Take 1 tablet by mouth 3 (three) times daily.  . chlorhexidine (PERIDEX) 0.12 % solution Use as directed 15 mLs in the mouth or throat 2 (two) times daily. Swish and spit  . Chlorpheniramine-APAP (CORICIDIN) 2-325 MG TABS Take 1 tablet by mouth 2 (two) times daily as needed (congestion).  . citalopram (CELEXA) 10 MG tablet Take 30 mg by mouth daily.   Marland Kitchen docusate sodium (COLACE) 100 MG capsule Take 100 mg by mouth at bedtime.  . furosemide (LASIX) 20 MG tablet Take 1 tablet (20 mg total) by mouth every Monday, Wednesday, and Friday.  . galantamine (RAZADYNE ER) 8 MG 24 hr capsule Take 8 mg by mouth daily with breakfast.  . LORazepam (ATIVAN) 0.5 MG tablet Take 1 tablet (0.5 mg total) by mouth 2 (two) times daily.  . metoprolol succinate (TOPROL-XL) 100 MG 24 hr tablet Take 100 mg by mouth in the morning and at bedtime. Take with or immediately following a meal.  . Multiple Vitamins-Minerals (CERTAVITE SENIOR/ANTIOXIDANT PO) Take 1 tablet by mouth daily.   Vladimir Faster Glycol-Propyl Glycol (SYSTANE) 0.4-0.3 % SOLN Place 1 drop into both eyes every hour as needed (dry eyes/irritation).  . polyethylene glycol (MIRALAX / GLYCOLAX) packet Take 17 g by mouth daily as needed (constipation). Mix in 4-8 oz liquid and drink  . potassium chloride SA (K-DUR,KLOR-CON) 20 MEQ tablet Take 20 mEq by mouth every other day.  . Psyllium (METAMUCIL PO) Take 5 mLs by mouth 2 (two) times daily. Mix in water and drink  . senna-docusate (SENOKOT-S) 8.6-50 MG tablet Take 1 tablet by mouth at bedtime.  . simvastatin (ZOCOR) 10 MG tablet Take 1 tablet (10 mg total) by mouth daily at 6 PM.  . Skin Protectants, Misc. (CALAZIME SKIN PROTECTANT EX) Apply 1 application topically See admin instructions. Apply topically to affected area of coccyx and buttocks as needed for rash/irritation  . umeclidinium-vilanterol (ANORO ELLIPTA) 62.5-25  MCG/INH AEPB Inhale 1 puff into the lungs daily.  . ziprasidone (GEODON) 60 MG capsule Take 60 mg by mouth at bedtime.   . cephALEXin (KEFLEX) 500 MG capsule Take 1 capsule (500  mg total) by mouth 3 (three) times daily.  . [DISCONTINUED] metoprolol tartrate (LOPRESSOR) 25 MG tablet Take 3 tablets (75 mg total) by mouth 2 (two) times daily.   No facility-administered encounter medications on file as of 04/15/2019.  :   Review of Systems:  Out of a complete 14 point review of systems, all are reviewed and negative with the exception of these symptoms as listed below:   Review of Systems  Neurological:       Here for evaluation for parkinson's. Pt's daughter is wanting to confirm if the pt has parkinson's disease or not.     Objective:  Neurological Exam  Physical Exam Physical Examination:   Vitals:   04/15/19 1133  BP: 116/62  Pulse: 61  Temp: (!) 97 F (36.1 C)  SpO2: 100%   General Examination: The patient is a very pleasant 86 y.o. female in no acute distress. She is thin and frail appearing, she is lethargic, she is in a wheelchair, she frequently falling asleep during her visit, leaning to the right.    HEENT: Normocephalic, atraumatic, pupils are equal, round and reactive to light and accommodation. She has corrective eyeglasses.  Funduscopic exam is not possible as she does not cooperate enough.  Face is symmetric, no obvious facial masking noted, mild nuchal rigidity noted, no lip, neck or jaw tremor, minimally verbal, no obvious dysarthria, no obvious hypophonia noted.  Hearing appears to be grossly intact to mildly impaired. Airway examination reveals moderate mouth dryness, tongue protrudes centrally in palate elevates symmetrically.  Chest: Clear to auscultation without wheezing, rhonchi or crackles noted.  Heart: S1+S2+0, regular, Mildly bradycardic.  Abdomen: Soft, non-tender and non-distended with normal bowel sounds appreciated on auscultation.  Extremities:  There is no pitting edema in the distal lower extremities bilaterally.  Skin: Warm and dry without trophic changes noted.  Musculoskeletal: exam reveals no obvious joint deformities, tenderness or joint swelling or erythema.   Neurologically:  Mental status: The patient is Lethargic, intermittently asleep, arousable by voice.  She follows commands primarily by mimicking or very simple commands by following verbally.  Cranial nerves are as described above under HEENT exam.  She has a stooped posture, increase in Thoracic kyphosis. Motor exam: Thin bulk, global strength of 4 out of 5, no resting tremor, tone appears to be perhaps mildly increased in the upper extremities.  On fine motor skills she has global difficulty with fine motor skills including finger taps and hand movements and foot taps, in the mild to moderate degree, no lateralization. Cerebellar testing: Finger-to-nose and heel-to-shin are not possible.  Romberg is not possible.  Sensory exam appears to be intact to light touch.  Gait, station and balance: I did not have her stand or walk for me today, she did not bring a walker, plus she is lethargic.  Assessment and plan:   In summary, Yolanda Baker is a very pleasant 86 y.o.-year old female with an underlying complex medical history of congestive heart failure, A. fib, mood disorder, memory loss, hypertension, stroke, arthritis, osteoporosis, and history of pneumonia, who was recently hospitalized in December 2020 with acute CHF.  She was started on Sinemet 10-100 mg strength 1 pill 3 times daily for concern for parkinsonism.  She has a risk for drug-induced parkinsonism.  She does not have any telltale signs of idiopathic Parkinson's disease.  I am not sure that she has benefited from the medication, I do not see any strong signs of parkinsonism even.  She has  had some slowness, hypophonia, difficulty with fine motor skills for years, I noticed some of these changes even over 4 years  ago.  I would not favor that she continue with Parkinson's disease medication at this time.  I would recommend that she be tapered off by reducing the Sinemet to 1 pill twice daily for 1 week and then 1 pill daily for 1 week, then stop.Patient's daughter has not noticed much in the [place] of improvement in her mobility since she was placed on the Sinemet.  If anything, the patient has been more lethargic since her hospitalization.  I suggested no other medication changes.  She is in close follow-up with her psychiatrist on a regular basis.  I do not believe she needs a follow-up appointment with me.  I provided written instructions.  I answered patient's daughter's questions today and she was in agreement. Yolanda Foley, MD, PhD

## 2019-04-27 DIAGNOSIS — M6281 Muscle weakness (generalized): Secondary | ICD-10-CM | POA: Diagnosis not present

## 2019-04-27 DIAGNOSIS — R488 Other symbolic dysfunctions: Secondary | ICD-10-CM | POA: Diagnosis not present

## 2019-04-27 DIAGNOSIS — R278 Other lack of coordination: Secondary | ICD-10-CM | POA: Diagnosis not present

## 2019-04-28 DIAGNOSIS — R2689 Other abnormalities of gait and mobility: Secondary | ICD-10-CM | POA: Diagnosis not present

## 2019-04-28 DIAGNOSIS — R262 Difficulty in walking, not elsewhere classified: Secondary | ICD-10-CM | POA: Diagnosis not present

## 2019-04-28 DIAGNOSIS — R2681 Unsteadiness on feet: Secondary | ICD-10-CM | POA: Diagnosis not present

## 2019-04-28 DIAGNOSIS — M6281 Muscle weakness (generalized): Secondary | ICD-10-CM | POA: Diagnosis not present

## 2019-04-29 DIAGNOSIS — R488 Other symbolic dysfunctions: Secondary | ICD-10-CM | POA: Diagnosis not present

## 2019-04-30 DIAGNOSIS — M6281 Muscle weakness (generalized): Secondary | ICD-10-CM | POA: Diagnosis not present

## 2019-04-30 DIAGNOSIS — R2681 Unsteadiness on feet: Secondary | ICD-10-CM | POA: Diagnosis not present

## 2019-04-30 DIAGNOSIS — R262 Difficulty in walking, not elsewhere classified: Secondary | ICD-10-CM | POA: Diagnosis not present

## 2019-04-30 DIAGNOSIS — R2689 Other abnormalities of gait and mobility: Secondary | ICD-10-CM | POA: Diagnosis not present

## 2019-05-02 DIAGNOSIS — R488 Other symbolic dysfunctions: Secondary | ICD-10-CM | POA: Diagnosis not present

## 2019-05-04 ENCOUNTER — Telehealth: Payer: Self-pay | Admitting: Psychiatry

## 2019-05-04 DIAGNOSIS — R488 Other symbolic dysfunctions: Secondary | ICD-10-CM | POA: Diagnosis not present

## 2019-05-04 DIAGNOSIS — M6281 Muscle weakness (generalized): Secondary | ICD-10-CM | POA: Diagnosis not present

## 2019-05-04 DIAGNOSIS — R278 Other lack of coordination: Secondary | ICD-10-CM | POA: Diagnosis not present

## 2019-05-04 NOTE — Telephone Encounter (Signed)
Pt daughter called Water engineer) checking status on fax from insurance company that was faxed about Citalopram med. Please return call to daughter [name] @ [phone]

## 2019-05-05 DIAGNOSIS — R2689 Other abnormalities of gait and mobility: Secondary | ICD-10-CM | POA: Diagnosis not present

## 2019-05-05 DIAGNOSIS — M6281 Muscle weakness (generalized): Secondary | ICD-10-CM | POA: Diagnosis not present

## 2019-05-05 DIAGNOSIS — R2681 Unsteadiness on feet: Secondary | ICD-10-CM | POA: Diagnosis not present

## 2019-05-05 DIAGNOSIS — R262 Difficulty in walking, not elsewhere classified: Secondary | ICD-10-CM | POA: Diagnosis not present

## 2019-05-05 DIAGNOSIS — R278 Other lack of coordination: Secondary | ICD-10-CM | POA: Diagnosis not present

## 2019-05-06 ENCOUNTER — Encounter (HOSPITAL_COMMUNITY): Payer: Self-pay

## 2019-05-06 ENCOUNTER — Emergency Department (HOSPITAL_COMMUNITY): Payer: Medicare PPO

## 2019-05-06 ENCOUNTER — Other Ambulatory Visit: Payer: Self-pay

## 2019-05-06 ENCOUNTER — Inpatient Hospital Stay (HOSPITAL_COMMUNITY)
Admission: EM | Admit: 2019-05-06 | Discharge: 2019-05-10 | DRG: 534 | Disposition: A | Discharge status: Nursing Home (Includes ICF) | Payer: Medicare PPO | Attending: Internal Medicine | Admitting: Internal Medicine

## 2019-05-06 DIAGNOSIS — Z881 Allergy status to other antibiotic agents status: Secondary | ICD-10-CM | POA: Diagnosis not present

## 2019-05-06 DIAGNOSIS — I4891 Unspecified atrial fibrillation: Secondary | ICD-10-CM | POA: Diagnosis present

## 2019-05-06 DIAGNOSIS — M6281 Muscle weakness (generalized): Secondary | ICD-10-CM | POA: Diagnosis not present

## 2019-05-06 DIAGNOSIS — I509 Heart failure, unspecified: Secondary | ICD-10-CM | POA: Diagnosis present

## 2019-05-06 DIAGNOSIS — M810 Age-related osteoporosis without current pathological fracture: Secondary | ICD-10-CM | POA: Diagnosis present

## 2019-05-06 DIAGNOSIS — R9431 Abnormal electrocardiogram [ECG] [EKG]: Secondary | ICD-10-CM | POA: Diagnosis not present

## 2019-05-06 DIAGNOSIS — F0150 Vascular dementia without behavioral disturbance: Secondary | ICD-10-CM

## 2019-05-06 DIAGNOSIS — W19XXXA Unspecified fall, initial encounter: Secondary | ICD-10-CM | POA: Diagnosis not present

## 2019-05-06 DIAGNOSIS — Z96642 Presence of left artificial hip joint: Secondary | ICD-10-CM | POA: Diagnosis present

## 2019-05-06 DIAGNOSIS — Z8249 Family history of ischemic heart disease and other diseases of the circulatory system: Secondary | ICD-10-CM | POA: Diagnosis not present

## 2019-05-06 DIAGNOSIS — I10 Essential (primary) hypertension: Secondary | ICD-10-CM | POA: Diagnosis not present

## 2019-05-06 DIAGNOSIS — L89151 Pressure ulcer of sacral region, stage 1: Secondary | ICD-10-CM | POA: Diagnosis present

## 2019-05-06 DIAGNOSIS — Z96649 Presence of unspecified artificial hip joint: Secondary | ICD-10-CM

## 2019-05-06 DIAGNOSIS — Z90710 Acquired absence of both cervix and uterus: Secondary | ICD-10-CM

## 2019-05-06 DIAGNOSIS — Z7189 Other specified counseling: Secondary | ICD-10-CM | POA: Diagnosis not present

## 2019-05-06 DIAGNOSIS — M2550 Pain in unspecified joint: Secondary | ICD-10-CM | POA: Diagnosis not present

## 2019-05-06 DIAGNOSIS — Y92092 Bedroom in other non-institutional residence as the place of occurrence of the external cause: Secondary | ICD-10-CM

## 2019-05-06 DIAGNOSIS — M25551 Pain in right hip: Secondary | ICD-10-CM | POA: Diagnosis not present

## 2019-05-06 DIAGNOSIS — L8990 Pressure ulcer of unspecified site, unspecified stage: Secondary | ICD-10-CM | POA: Insufficient documentation

## 2019-05-06 DIAGNOSIS — S72401A Unspecified fracture of lower end of right femur, initial encounter for closed fracture: Principal | ICD-10-CM | POA: Diagnosis present

## 2019-05-06 DIAGNOSIS — Z823 Family history of stroke: Secondary | ICD-10-CM

## 2019-05-06 DIAGNOSIS — Z20822 Contact with and (suspected) exposure to covid-19: Secondary | ICD-10-CM | POA: Diagnosis not present

## 2019-05-06 DIAGNOSIS — Z515 Encounter for palliative care: Secondary | ICD-10-CM

## 2019-05-06 DIAGNOSIS — R404 Transient alteration of awareness: Secondary | ICD-10-CM | POA: Diagnosis not present

## 2019-05-06 DIAGNOSIS — Z66 Do not resuscitate: Secondary | ICD-10-CM | POA: Diagnosis not present

## 2019-05-06 DIAGNOSIS — F329 Major depressive disorder, single episode, unspecified: Secondary | ICD-10-CM | POA: Diagnosis present

## 2019-05-06 DIAGNOSIS — I110 Hypertensive heart disease with heart failure: Secondary | ICD-10-CM | POA: Diagnosis present

## 2019-05-06 DIAGNOSIS — W06XXXA Fall from bed, initial encounter: Secondary | ICD-10-CM | POA: Diagnosis present

## 2019-05-06 DIAGNOSIS — F0390 Unspecified dementia without behavioral disturbance: Secondary | ICD-10-CM | POA: Diagnosis not present

## 2019-05-06 DIAGNOSIS — S0990XA Unspecified injury of head, initial encounter: Secondary | ICD-10-CM | POA: Diagnosis not present

## 2019-05-06 DIAGNOSIS — M9701XA Periprosthetic fracture around internal prosthetic right hip joint, initial encounter: Secondary | ICD-10-CM

## 2019-05-06 DIAGNOSIS — Z7401 Bed confinement status: Secondary | ICD-10-CM | POA: Diagnosis not present

## 2019-05-06 DIAGNOSIS — M978XXA Periprosthetic fracture around other internal prosthetic joint, initial encounter: Secondary | ICD-10-CM

## 2019-05-06 DIAGNOSIS — F419 Anxiety disorder, unspecified: Secondary | ICD-10-CM | POA: Diagnosis present

## 2019-05-06 DIAGNOSIS — Z743 Need for continuous supervision: Secondary | ICD-10-CM | POA: Diagnosis not present

## 2019-05-06 DIAGNOSIS — Z79899 Other long term (current) drug therapy: Secondary | ICD-10-CM | POA: Diagnosis not present

## 2019-05-06 DIAGNOSIS — I482 Chronic atrial fibrillation, unspecified: Secondary | ICD-10-CM

## 2019-05-06 DIAGNOSIS — R278 Other lack of coordination: Secondary | ICD-10-CM | POA: Diagnosis not present

## 2019-05-06 DIAGNOSIS — Z7901 Long term (current) use of anticoagulants: Secondary | ICD-10-CM | POA: Diagnosis not present

## 2019-05-06 DIAGNOSIS — Z8673 Personal history of transient ischemic attack (TIA), and cerebral infarction without residual deficits: Secondary | ICD-10-CM

## 2019-05-06 DIAGNOSIS — M1909 Primary osteoarthritis, other specified site: Secondary | ICD-10-CM | POA: Diagnosis present

## 2019-05-06 DIAGNOSIS — I959 Hypotension, unspecified: Secondary | ICD-10-CM | POA: Diagnosis not present

## 2019-05-06 DIAGNOSIS — Z03818 Encounter for observation for suspected exposure to other biological agents ruled out: Secondary | ICD-10-CM | POA: Diagnosis not present

## 2019-05-06 DIAGNOSIS — I4892 Unspecified atrial flutter: Secondary | ICD-10-CM | POA: Diagnosis not present

## 2019-05-06 DIAGNOSIS — Z96641 Presence of right artificial hip joint: Secondary | ICD-10-CM | POA: Diagnosis not present

## 2019-05-06 DIAGNOSIS — R5381 Other malaise: Secondary | ICD-10-CM | POA: Diagnosis not present

## 2019-05-06 DIAGNOSIS — S7291XA Unspecified fracture of right femur, initial encounter for closed fracture: Secondary | ICD-10-CM

## 2019-05-06 DIAGNOSIS — M9701XD Periprosthetic fracture around internal prosthetic right hip joint, subsequent encounter: Secondary | ICD-10-CM | POA: Diagnosis not present

## 2019-05-06 LAB — URINALYSIS, ROUTINE W REFLEX MICROSCOPIC
Bilirubin Urine: NEGATIVE
Glucose, UA: NEGATIVE mg/dL
Hgb urine dipstick: NEGATIVE
Ketones, ur: NEGATIVE mg/dL
Leukocytes,Ua: NEGATIVE
Nitrite: NEGATIVE
Protein, ur: NEGATIVE mg/dL
Specific Gravity, Urine: 1.016 (ref 1.005–1.030)
pH: 6 (ref 5.0–8.0)

## 2019-05-06 LAB — BASIC METABOLIC PANEL
Anion gap: 7 (ref 5–15)
BUN: 24 mg/dL — ABNORMAL HIGH (ref 8–23)
CO2: 27 mmol/L (ref 22–32)
Calcium: 8.2 mg/dL — ABNORMAL LOW (ref 8.9–10.3)
Chloride: 102 mmol/L (ref 98–111)
Creatinine, Ser: 0.8 mg/dL (ref 0.44–1.00)
GFR calc Af Amer: >60 mL/min (ref >60)
GFR calc non Af Amer: >60 mL/min (ref >60)
Glucose, Bld: 121 mg/dL — ABNORMAL HIGH (ref 70–99)
Potassium: 3.9 mmol/L (ref 3.5–5.1)
Sodium: 136 mmol/L (ref 135–145)

## 2019-05-06 LAB — CBC WITH DIFFERENTIAL/PLATELET
Abs Immature Granulocytes: 0.06 10*3/uL (ref 0.00–0.07)
Basophils Absolute: 0 10*3/uL (ref 0.0–0.1)
Basophils Relative: 0 %
Eosinophils Absolute: 0 10*3/uL (ref 0.0–0.5)
Eosinophils Relative: 0 %
HCT: 32.3 % — ABNORMAL LOW (ref 36.0–46.0)
Hemoglobin: 10.2 g/dL — ABNORMAL LOW (ref 12.0–15.0)
Immature Granulocytes: 1 %
Lymphocytes Relative: 15 %
Lymphs Abs: 1.8 10*3/uL (ref 0.7–4.0)
MCH: 33.9 pg (ref 26.0–34.0)
MCHC: 31.6 g/dL (ref 30.0–36.0)
MCV: 107.3 fL — ABNORMAL HIGH (ref 80.0–100.0)
Monocytes Absolute: 1.2 10*3/uL — ABNORMAL HIGH (ref 0.1–1.0)
Monocytes Relative: 10 %
Neutro Abs: 9.2 10*3/uL — ABNORMAL HIGH (ref 1.7–7.7)
Neutrophils Relative %: 74 %
Platelets: 224 10*3/uL (ref 150–400)
RBC: 3.01 MIL/uL — ABNORMAL LOW (ref 3.87–5.11)
RDW: 14.6 % (ref 11.5–15.5)
WBC: 12.4 10*3/uL — ABNORMAL HIGH (ref 4.0–10.5)
nRBC: 0 % (ref 0.0–0.2)

## 2019-05-06 MED ORDER — DOCUSATE SODIUM 100 MG PO CAPS
100.0000 mg | ORAL_CAPSULE | Freq: Two times a day (BID) | ORAL | Status: DC
  Administered 2019-05-06 – 2019-05-10 (×8): 100 mg via ORAL
  Filled 2019-05-06 (×8): qty 1

## 2019-05-06 MED ORDER — APIXABAN 2.5 MG PO TABS
2.5000 mg | ORAL_TABLET | Freq: Two times a day (BID) | ORAL | Status: DC
  Administered 2019-05-06 – 2019-05-10 (×8): 2.5 mg via ORAL
  Filled 2019-05-06 (×8): qty 1

## 2019-05-06 MED ORDER — SIMVASTATIN 20 MG PO TABS
10.0000 mg | ORAL_TABLET | Freq: Every day | ORAL | Status: DC
  Administered 2019-05-06 – 2019-05-09 (×4): 10 mg via ORAL
  Filled 2019-05-06 (×4): qty 1

## 2019-05-06 MED ORDER — METHOCARBAMOL 1000 MG/10ML IJ SOLN
500.0000 mg | Freq: Four times a day (QID) | INTRAVENOUS | Status: DC | PRN
  Filled 2019-05-06: qty 5

## 2019-05-06 MED ORDER — AMLODIPINE BESYLATE 5 MG PO TABS
5.0000 mg | ORAL_TABLET | Freq: Every day | ORAL | Status: DC
  Administered 2019-05-07 – 2019-05-10 (×4): 5 mg via ORAL
  Filled 2019-05-06 (×4): qty 1

## 2019-05-06 MED ORDER — ZIPRASIDONE HCL 60 MG PO CAPS
60.0000 mg | ORAL_CAPSULE | Freq: Every day | ORAL | Status: DC
  Administered 2019-05-07 – 2019-05-10 (×4): 60 mg via ORAL
  Filled 2019-05-06 (×4): qty 1

## 2019-05-06 MED ORDER — AYR SALINE NASAL NA GEL: 1.0000 "application " | Freq: Every day | NASAL | Status: DC

## 2019-05-06 MED ORDER — CHLORHEXIDINE GLUCONATE 0.12 % MT SOLN
15.0000 mL | Freq: Two times a day (BID) | OROMUCOSAL | Status: DC
  Administered 2019-05-06 – 2019-05-10 (×5): 15 mL via OROMUCOSAL
  Filled 2019-05-06 (×5): qty 15

## 2019-05-06 MED ORDER — HYDROCODONE-ACETAMINOPHEN 5-325 MG PO TABS
1.0000 | ORAL_TABLET | Freq: Four times a day (QID) | ORAL | Status: DC | PRN
  Administered 2019-05-06 – 2019-05-07 (×2): 2 via ORAL
  Administered 2019-05-08: 1 via ORAL
  Filled 2019-05-06 (×2): qty 2
  Filled 2019-05-06: qty 1

## 2019-05-06 MED ORDER — CITALOPRAM HYDROBROMIDE 20 MG PO TABS
30.0000 mg | ORAL_TABLET | Freq: Every day | ORAL | Status: DC
  Administered 2019-05-07 – 2019-05-10 (×4): 30 mg via ORAL
  Filled 2019-05-06 (×4): qty 2

## 2019-05-06 MED ORDER — LORAZEPAM 0.5 MG PO TABS
0.5000 mg | ORAL_TABLET | Freq: Two times a day (BID) | ORAL | Status: DC
  Administered 2019-05-06 – 2019-05-10 (×7): 0.5 mg via ORAL
  Filled 2019-05-06 (×7): qty 1

## 2019-05-06 MED ORDER — PSYLLIUM 95 % PO PACK
1.0000 | PACK | Freq: Every day | ORAL | Status: DC
  Administered 2019-05-07 – 2019-05-10 (×4): 1 via ORAL
  Filled 2019-05-06 (×4): qty 1

## 2019-05-06 MED ORDER — ACETAMINOPHEN 325 MG PO TABS
650.0000 mg | ORAL_TABLET | Freq: Four times a day (QID) | ORAL | Status: DC | PRN
  Administered 2019-05-09: 650 mg via ORAL
  Filled 2019-05-06: qty 2

## 2019-05-06 MED ORDER — MORPHINE SULFATE (PF) 2 MG/ML IV SOLN: 0.5000 mg | INTRAVENOUS | Status: DC | PRN

## 2019-05-06 MED ORDER — BISACODYL 10 MG RE SUPP: 10.0000 mg | Freq: Every day | RECTAL | Status: DC | PRN

## 2019-05-06 MED ORDER — POLYVINYL ALCOHOL 1.4 % OP SOLN: 1.0000 [drp] | OPHTHALMIC | Status: DC | PRN

## 2019-05-06 MED ORDER — FUROSEMIDE 20 MG PO TABS
20.0000 mg | ORAL_TABLET | ORAL | Status: DC
  Administered 2019-05-08 – 2019-05-10 (×2): 20 mg via ORAL
  Filled 2019-05-06 (×2): qty 1

## 2019-05-06 MED ORDER — METOPROLOL TARTRATE 50 MG PO TABS
100.0000 mg | ORAL_TABLET | Freq: Two times a day (BID) | ORAL | Status: DC
  Administered 2019-05-06 – 2019-05-10 (×6): 100 mg via ORAL
  Filled 2019-05-06 (×6): qty 2

## 2019-05-06 MED ORDER — POTASSIUM CHLORIDE CRYS ER 20 MEQ PO TBCR
20.0000 meq | EXTENDED_RELEASE_TABLET | ORAL | Status: DC
  Administered 2019-05-08 – 2019-05-10 (×2): 20 meq via ORAL
  Filled 2019-05-06 (×2): qty 1

## 2019-05-06 MED ORDER — GALANTAMINE HYDROBROMIDE ER 8 MG PO CP24
8.0000 mg | ORAL_CAPSULE | Freq: Every day | ORAL | Status: DC
  Administered 2019-05-07 – 2019-05-10 (×4): 8 mg via ORAL
  Filled 2019-05-06 (×4): qty 1

## 2019-05-06 MED ORDER — METHOCARBAMOL 500 MG PO TABS: 500.0000 mg | ORAL_TABLET | Freq: Four times a day (QID) | ORAL | Status: DC | PRN

## 2019-05-06 MED ORDER — ENSURE ENLIVE PO LIQD
237.0000 mL | Freq: Two times a day (BID) | ORAL | Status: DC
  Administered 2019-05-07 – 2019-05-10 (×4): 237 mL via ORAL

## 2019-05-06 MED ORDER — SALINE SPRAY 0.65 % NA SOLN
3.0000 | Freq: Four times a day (QID) | NASAL | Status: DC
  Administered 2019-05-07 – 2019-05-10 (×6): 3 via NASAL
  Filled 2019-05-06: qty 44

## 2019-05-06 MED ORDER — ACETAMINOPHEN 650 MG RE SUPP: 650.0000 mg | RECTAL | Status: DC | PRN

## 2019-05-06 MED ORDER — CHLORPHENIRAMINE-ACETAMINOPHEN 2-325 MG PO TABS: 1.0000 | ORAL_TABLET | Freq: Two times a day (BID) | ORAL | Status: DC | PRN

## 2019-05-06 MED ORDER — POLYETHYL GLYCOL-PROPYL GLYCOL 0.4-0.3 % OP SOLN: 1.0000 [drp] | OPHTHALMIC | Status: DC | PRN

## 2019-05-06 NOTE — Progress Notes (Signed)
   05/06/19 2135  Vitals  Temp (!) 100.6 F (38.1 C)  Temp Source Oral  BP (!) 123/54  MAP (mmHg) 74  BP Location Left Arm  BP Method Automatic  Patient Position (if appropriate) Lying  Pulse Rate (!) 55  Resp (!) 22  Oxygen Therapy  SpO2 97 %  O2 Device Room Air  MEWS Score  MEWS Temp 1  MEWS Systolic 0  MEWS Pulse 0  MEWS RR 1  MEWS LOC 0  MEWS Score 2  MEWS Score Color Yellow

## 2019-05-06 NOTE — ED Notes (Signed)
Patient called out to void. New brief placed and pure wick applied.

## 2019-05-06 NOTE — Progress Notes (Signed)
PCP was notified of [place].

## 2019-05-06 NOTE — ED Triage Notes (Addendum)
Patient arrived via [facility] from [facility].   C/O fall C/O hip pain   Patient fell last night about 10:30 pm. Unwitnessed.  Patient told daughter that she fell trying to get out of bed.   This morning patient started to c/o right hip pain.   Staff states patient has a red area noted on back of thigh/buttok. Patient is c/o pain at the right hip.   Per daughter patient has had previous hip replacements.  Patient denies pain while lying still. C/O increased pain with activity. Patient able to bare weight on right side.  No deformities, rotation, or shortening noted by ems  Full code per ems.   A/Ox3   Patient states she takes a blood thinner.

## 2019-05-06 NOTE — ED Provider Notes (Signed)
[facility]-EMERGENCY DEPT Provider Note   CSN: [id number] Arrival date & time: 05/06/19  1315     History Chief Complaint  Patient presents with  . Fall  . Hip Pain    right     Yolanda Baker is a 86 y.o. female.  86 year old female with past medical history including CHF, atrial fibrillation on anticoagulation, CVA, memory loss, hypertension who presents with fall.  Last night around 10:30 PM, patient was at her nursing facility and apparently fell out of bed.  Fall was unwitnessed but staff found her and helped her back up.  At the time she was not complaining of anything but this morning has been complaining of right hip pain.  She was able to bear weight but pain has gotten worse with weightbearing.  She denies any other areas of pain.  The daughter states that nursing facility was concerned about the possibility of a UTI given her fall.  No recent illness.   LEVEL 5 CAVEAT DUE TO MEMORY LOSS  The history is provided by the patient and a relative.  Fall  Hip Pain       Past Medical History:  Diagnosis Date  . Anxiety   . Arthritis    neck  . Bruises easily   . Depression   . Dysrhythmia    hx A-FLUTTER after hip surg April 2015- take metoprolol  . Eczema   . Hip joint pain    RT  . Hypertension   . Memory loss   . Osteoporosis   . Pneumonia    august 2017  . Shortness of breath    with activity  . Stroke [facility])    "mini stroke" per MRI - no deficiets     Patient Active Problem List   Diagnosis Date Noted  . DNR (do not resuscitate) present on admission 02/04/2019  . Acute on chronic diastolic heart failure (HCC)   . Acute CHF (congestive heart failure) (HCC) 02/03/2019  . Atrial fibrillation with rapid ventricular response (HCC) 02/03/2019  . GAD (generalized anxiety disorder) 01/15/2018  . Schizoaffective disorder (HCC) 01/15/2018  . Hyperlipidemia LDL goal <70 07/01/2014  . Constipation chronic 07/01/2014  . Acute CVA  (cerebrovascular accident) (HCC)   . Acute blood loss anemia 10/12/2013  . Postoperative anemia due to acute blood loss 10/04/2013  . Postop Transfusion 10/04/2013  . Closed fracture of femur with nonunion 10/03/2013  . Femur fracture, right (HCC) 10/03/2013  . Closed femur fracture (HCC) 10/03/2013  . UTI (urinary tract infection) 06/23/2013  . Dementia (HCC) 06/09/2013  . Anxiety 06/09/2013  . Atrial flutter (HCC) 06/07/2013  . Hip fracture (HCC) 06/04/2013  . Depression 06/04/2013  . Anxiety state 06/04/2013  . HTN (hypertension) 06/04/2013  . Closed right hip fracture (HCC) 06/04/2013    Past Surgical History:  Procedure Laterality Date  . ABDOMINAL HYSTERECTOMY    . FRACTURE SURGERY Right 06/05/2013   hip  . HIP ARTHROPLASTY Right 10/03/2013   Procedure: CONVERSION OF PREVIOUS RIGHT HIP SURGERY TO A RIGHT TOTAL HIP ARTHROPLASTY;  Surgeon: Loanne Drilling, MD;  Location: [facility];  Service: Orthopedics;  Laterality: Right;  . INTRAMEDULLARY (IM) NAIL INTERTROCHANTERIC Right 06/05/2013   Procedure: INTRAMEDULLARY (IM) NAIL INTERTROCHANTRIC;  Surgeon: Loanne Drilling, MD;  Location: [facility];  Service: Orthopedics;  Laterality: Right;  . JOINT REPLACEMENT     left hip  . left hip Left 2008  . LEG SURGERY Right    x2  OB History   No obstetric history on file.     Family History  Problem Relation Age of Onset  . Heart attack Father   . Hypertension Father   . Hypertension Sister   . Stroke Sister   . Hypertension Brother     Social History   Tobacco Use  . Smoking status: Never Smoker  . Smokeless tobacco: Never Used  Substance Use Topics  . Alcohol use: No    Alcohol/week: 0.0 standard drinks  . Drug use: No    Home Medications Prior to Admission medications   Medication Sig Start Date End Date Taking? Authorizing Provider  acetaminophen (TYLENOL) 500 MG tablet Take 1 tablet (500 mg total) by mouth 2 (two) times daily as needed for mild pain or moderate  pain. Patient taking differently: Take 500 mg by mouth 2 (two) times daily.  03/02/15  Yes Leta Baptist, MD  amLODipine (NORVASC) 5 MG tablet Take 5 mg by mouth daily.   Yes [provider]  apixaban (ELIQUIS) 2.5 MG TABS tablet Take 1 tablet (2.5 mg total) by mouth 2 (two) times daily. 07/01/14  Yes Layne Benton, NP  chlorhexidine (PERIDEX) 0.12 % solution Use as directed 15 mLs in the mouth or throat 2 (two) times daily. Swish and spit   Yes [provider]  Chlorpheniramine-APAP (CORICIDIN) 2-325 MG TABS Take 1 tablet by mouth 2 (two) times daily as needed (congestion).   Yes [provider]  citalopram (CELEXA) 10 MG tablet Take 30 mg by mouth daily.  01/03/15  Yes [provider]  furosemide (LASIX) 20 MG tablet Take 1 tablet (20 mg total) by mouth every Monday, Wednesday, and Friday. Patient taking differently: Take 20 mg by mouth every other day.  02/09/19  Yes Albertine Grates, MD  galantamine (RAZADYNE ER) 8 MG 24 hr capsule Take 8 mg by mouth daily with breakfast.   Yes [provider]  LORazepam (ATIVAN) 0.5 MG tablet Take 1 tablet (0.5 mg total) by mouth 2 (two) times daily. 02/04/19  Yes Swayze, Ava, DO  metoprolol tartrate (LOPRESSOR) 100 MG tablet Take 100 mg by mouth 2 (two) times daily. 05/04/19  Yes [provider]  Multiple Vitamins-Minerals (CERTAVITE SENIOR/ANTIOXIDANT PO) Take 1 tablet by mouth daily.    Yes [provider]  Polyethyl Glycol-Propyl Glycol (SYSTANE) 0.4-0.3 % SOLN Place 1 drop into both eyes every hour as needed (dry eyes/irritation).   Yes [provider]  potassium chloride SA (K-DUR,KLOR-CON) 20 MEQ tablet Take 20 mEq by mouth every other day.   Yes [provider]  psyllium (REGULOID) 0.52 g capsule Take 0.52 g by mouth daily.   Yes [provider]  saline (AYR) GEL Place 1 application into both nostrils at bedtime.   Yes [provider]  simvastatin (ZOCOR) 10 MG  tablet Take 1 tablet (10 mg total) by mouth daily at 6 PM. 07/01/14  Yes Biby, Jani Files, NP  sodium chloride (OCEAN) 0.65 % SOLN nasal spray Place 3 sprays into both nostrils 4 (four) times daily.   Yes [provider]  STIOLTO RESPIMAT 2.5-2.5 MCG/ACT AERS Inhale 2 puffs into the lungs daily. 04/17/19  Yes [provider]  ziprasidone (GEODON) 60 MG capsule Take 60 mg by mouth daily.  06/08/15  Yes [provider]  carbidopa-levodopa (SINEMET IR) 10-100 MG tablet Take 1 tablet by mouth 3 (three) times daily. Patient not taking: Reported on 05/06/2019 02/06/19   Albertine Grates, MD  cephALEXin (  KEFLEX) 500 MG capsule Take 1 capsule (500 mg total) by mouth 3 (three) times daily. Patient not taking: Reported on 05/06/2019 04/05/19   Lacretia Leigh, MD  senna-docusate (SENOKOT-S) 8.6-50 MG tablet Take 1 tablet by mouth at bedtime. Patient not taking: Reported on 05/06/2019 02/07/19   Florencia Reasons, MD  Skin Protectants, Misc. (CALAZIME SKIN PROTECTANT EX) Apply 1 application topically See admin instructions. Apply topically to affected area of coccyx and buttocks as needed for rash/irritation    [provider]    Allergies    Flagyl [metronidazole]  Review of Systems   Review of Systems  Unable to perform ROS: Dementia     Physical Exam Updated Vital Signs BP (!) 135/52   Pulse 62   Temp 99.4 F (37.4 C) (Oral)   Resp 16   SpO2 99%   Physical Exam Vitals and nursing note reviewed.  Constitutional:      General: She is not in acute distress.    Appearance: She is well-developed.     Comments: Frail, elderly woman  HENT:     Head: Normocephalic and atraumatic.  Eyes:     Conjunctiva/sclera: Conjunctivae normal.     Pupils: Pupils are equal, round, and reactive to light.  Cardiovascular:     Rate and Rhythm: Regular rhythm. Bradycardia present.     Pulses: Normal pulses.     Heart sounds: Murmur present.  Pulmonary:     Effort: Pulmonary effort is normal.      Breath sounds: Normal breath sounds.  Chest:     Chest wall: No tenderness.  Abdominal:     General: Bowel sounds are normal. There is no distension.     Palpations: Abdomen is soft.     Tenderness: There is no abdominal tenderness.  Musculoskeletal:        General: Tenderness present.     Cervical back: Neck supple. No tenderness.     Comments: R hip held in external rotation and partial flexion, pain with further flexion; no obvious deformity; no tenderness at knees or ankles  Skin:    General: Skin is warm and dry.     Capillary Refill: Capillary refill takes less than 2 seconds.  Neurological:     Mental Status: She is alert.     Comments: Fluent speech, following commands  Psychiatric:        Behavior: Behavior normal.     ED Results / Procedures / Treatments   Labs (all labs ordered are listed, but only abnormal results are displayed) Labs Reviewed  SARS CORONAVIRUS 2 (TAT 6-24 HRS)  URINALYSIS, ROUTINE W REFLEX MICROSCOPIC  BASIC METABOLIC PANEL  CBC WITH DIFFERENTIAL/PLATELET    EKG None  Radiology DG Hip Unilat  With Pelvis 2-3 Views Right  Result Date: 05/06/2019 CLINICAL DATA:  Post fall. Right hip pain with weight-bearing and movement. Slip and fall 2 nights ago getting out of bed. EXAM: DG HIP (WITH OR WITHOUT PELVIS) 2-3V RIGHT COMPARISON:  Pelvis and left hip radiograph 03/02/2015 FINDINGS: Right hip arthroplasty in expected alignment. Acute periprosthetic fracture about the mid distal femoral shaft displacement of approximately 6 mm of the medial cortex. Periprosthetic lucency about the femoral stem is again seen. 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 There is heterotopic ossification about the lateral aspect of the right hip. Left hemiarthroplasty appears intact. The previous left greater trochanteric fracture has healed. Pubic rami appear intact. IMPRESSION: 1. Right hip arthroplasty. Acute displaced periprosthetic fracture about the mid to distal  femoral stem. 2. Chronic right acetabular protrusio. Chronic periprosthetic lucency about the femoral stem is again seen, suggesting loosening. 3. Left hip arthroplasty appears intact. Electronically Signed   By: Narda Rutherford M.D.   On: 05/06/2019 14:36    Procedures Procedures (including critical care time)  Medications Ordered in ED Medications - No data to display  ED Course  I have reviewed the triage vital signs and the nursing notes.  Pertinent labs & imaging results that were available during my care of the patient were reviewed by me and considered in my medical decision making (see chart for details).    MDM Rules/Calculators/A&P                      VS stable, no external signs of head trauma however given fall was unwitnessed and she is anticoagulated, obtained CT head in addition to hip XR.   XR shows periprosthetic fx of femur.  I discussed with Dr. Despina Hick who will see pt in consultation.  We will plan to admit to medicine after lab work is complete.  I have updated the patient's family member on this plan. Final Clinical Impression(s) / ED Diagnoses Final diagnoses:  Closed fracture of right femur, unspecified fracture morphology, unspecified portion of femur, initial encounter [facility])    Rx / DC Orders ED Discharge Orders    None       Ondre Salvetti, Ambrose Finland, MD 05/06/19 1555

## 2019-05-06 NOTE — ED Notes (Signed)
Ortho at bedside.

## 2019-05-06 NOTE — Consult Note (Signed)
Reason for Consult:Right periprosthetic femur fracture Referring Physician: Dr. Evlyn Clines Yolanda Baker is an 86 y.o. female.  HPI: Yolanda Baker is a 86 yo female well known to my practice from previous revision hip surgery in 2015. She was doing well, residing at [facility] and was noted to be found on the floor yesterday after an unwitnessed fall. She complained of hip pain but was able to get up and back into bed. Today she had increased pain on attempted weight bearing and was sent to the [facility] ED for evaluation where radiographs showed a periprosthetic femur fracture about her femoral stem. Her only complaint is hip pain.She was not having any pain prior to the fall and is not having pain at rest currently unless someone trys to move her right leg. She was minimally ambulatory with a walker prior to the fall. She mainly got around the facility in a wheelchair.   Past Medical History:  Diagnosis Date  . Anxiety   . Arthritis    neck  . Bruises easily   . Depression   . Dysrhythmia    hx A-FLUTTER after hip surg April 2015- take metoprolol  . Eczema   . Hip joint pain    RT  . Hypertension   . Memory loss   . Osteoporosis   . Pneumonia    august 2017  . Shortness of breath    with activity  . Stroke [facility])    "mini stroke" per MRI - no deficiets     Past Surgical History:  Procedure Laterality Date  . ABDOMINAL HYSTERECTOMY    . FRACTURE SURGERY Right 06/05/2013   hip  . HIP ARTHROPLASTY Right 10/03/2013   Procedure: CONVERSION OF PREVIOUS RIGHT HIP SURGERY TO A RIGHT TOTAL HIP ARTHROPLASTY;  Surgeon: Loanne Drilling, MD;  Location: [facility];  Service: Orthopedics;  Laterality: Right;  . INTRAMEDULLARY (IM) NAIL INTERTROCHANTERIC Right 06/05/2013   Procedure: INTRAMEDULLARY (IM) NAIL INTERTROCHANTRIC;  Surgeon: Loanne Drilling, MD;  Location: [facility];  Service: Orthopedics;  Laterality: Right;  . JOINT REPLACEMENT     left hip  . left hip Left 2008  . LEG SURGERY Right    x2     Family History  Problem Relation Age of Onset  . Heart attack Father   . Hypertension Father   . Hypertension Sister   . Stroke Sister   . Hypertension Brother     Social History:  reports that she has never smoked. She has never used smokeless tobacco. She reports that she does not drink alcohol or use drugs.  Allergies:  Allergies  Allergen Reactions  . Flagyl [Metronidazole]     Fingers get "numb"    Medications: I have reviewed the patient's current medications.  Results for orders placed or performed during the hospital encounter of 05/06/19 (from the past 48 hour(s))  Basic metabolic panel     Status: Abnormal   Collection Time: 05/06/19  3:26 PM  Result Value Ref Range   Sodium 136 135 - 145 mmol/L   Potassium 3.9 3.5 - 5.1 mmol/L   Chloride 102 98 - 111 mmol/L   CO2 27 22 - 32 mmol/L   Glucose, Bld 121 (H) 70 - 99 mg/dL    Comment: Glucose reference range applies only to samples taken after fasting for at least 8 hours.   BUN 24 (H) 8 - 23 mg/dL   Creatinine, Ser 6.38 0.44 - 1.00 mg/dL   Calcium 8.2 (L) 8.9 -  10.3 mg/dL   GFR calc non Af Amer >60 >60 mL/min   GFR calc Af Amer >60 >60 mL/min   Anion gap 7 5 - 15    Comment: Performed at [facility], [place] [address], [place], [place] [address]  CBC with Differential     Status: Abnormal   Collection Time: 05/06/19  3:26 PM  Result Value Ref Range   WBC 12.4 (H) 4.0 - 10.5 K/uL   RBC 3.01 (L) 3.87 - 5.11 MIL/uL   Hemoglobin 10.2 (L) 12.0 - 15.0 g/dL   HCT 32.3 (L) 36.0 - 46.0 %   MCV 107.3 (H) 80.0 - 100.0 fL   MCH 33.9 26.0 - 34.0 pg   MCHC 31.6 30.0 - 36.0 g/dL   RDW 14.6 11.5 - 15.5 %   Platelets 224 150 - 400 K/uL   nRBC 0.0 0.0 - 0.2 %   Neutrophils Relative % 74 %   Neutro Abs 9.2 (H) 1.7 - 7.7 K/uL   Lymphocytes Relative 15 %   Lymphs Abs 1.8 0.7 - 4.0 K/uL   Monocytes Relative 10 %   Monocytes Absolute 1.2 (H) 0.1 - 1.0 K/uL   Eosinophils Relative 0 %   Eosinophils  Absolute 0.0 0.0 - 0.5 K/uL   Basophils Relative 0 %   Basophils Absolute 0.0 0.0 - 0.1 K/uL   Immature Granulocytes 1 %   Abs Immature Granulocytes 0.06 0.00 - 0.07 K/uL    Comment: Performed at [facility], [place] [address] [place], [place] [address]    CT Head Wo Contrast  Result Date: 05/06/2019 CLINICAL DATA:  Fall on anticoagulation EXAM: CT HEAD WITHOUT CONTRAST TECHNIQUE: Contiguous axial images were obtained from the base of the skull through the vertex without intravenous contrast. COMPARISON:  06/29/2014 FINDINGS: Brain: No evidence of acute infarction, hemorrhage, hydrocephalus, extra-axial collection or mass lesion/mass effect. Chronic left basal ganglia lacunar infarcts. Extensive low-density changes within the periventricular and subcortical white matter compatible with chronic microvascular ischemic change. Moderate diffuse cerebral volume loss. Vascular: Atherosclerotic calcifications involving the large vessels of the skull base. No unexpected hyperdense vessel. Skull: Normal. Negative for fracture or focal lesion. Sinuses/Orbits: No acute finding. Other: None. IMPRESSION: 1.  No acute intracranial findings. 2.  Chronic microvascular ischemic change and cerebral volume loss. Electronically Signed   By: Davina Poke D.O.   On: 05/06/2019 16:36   DG Hip Unilat  With Pelvis 2-3 Views Right  Result Date: 05/06/2019 CLINICAL DATA:  Post fall. Right hip pain with weight-bearing and movement. Slip and fall 2 nights ago getting out of bed. EXAM: DG HIP (WITH OR WITHOUT PELVIS) 2-3V RIGHT COMPARISON:  Pelvis and left hip radiograph 03/02/2015 FINDINGS: Right hip arthroplasty in expected alignment. Acute periprosthetic fracture about the mid distal femoral shaft displacement of approximately 6 mm of the medial cortex. Periprosthetic lucency about the femoral stem is again seen. 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 There is heterotopic ossification about the  lateral aspect of the right hip. Left hemiarthroplasty appears intact. The previous left greater trochanteric fracture has healed. Pubic rami appear intact. IMPRESSION: 1. Right hip arthroplasty. Acute displaced periprosthetic fracture about the mid to distal femoral stem. 2. Chronic right acetabular protrusio. Chronic periprosthetic lucency about the femoral stem is again seen, suggesting loosening. 3. Left hip arthroplasty appears intact. Electronically Signed   By: Keith Rake M.D.   On: 05/06/2019 14:36    Review of Systems Blood pressure (!) 138/55, pulse Marland Kitchen)  56, temperature 99.4 F (37.4 C), temperature source Oral, resp. rate (!) 24, SpO2 100 %. Physical Exam,Physical Examination: General appearance - alert, well appearing, and in no distress Mental status - Alert and oriented to person and place Extremities - no pedal edema noted, intact peripheral pulses, no edema, redness or tenderness in the calves or thighs, right lower extremity slightly rotated externally. Pain on attempted flexion or abduction of the hip. No pain with gentle rotation; no deformity; leg moves as a single unit with hip motion; no thigh swelling; sensation and motor intact   Radiographs- AP pelvis, AP and lateral right hip show a cemented femoral stem with a acetabular component in protrusio which is well fixed and with a constrained liner. The femoral stem has lucencies surrounding it but no migration. There is an oblique periprosthetic femur fracture approximately 3 cm above the tip of the stem. The stem and cement are bridging across the fracture which is incomplete and minimally displaced.  Assessment/Plan: Right periprosthetic femur fracture- This is a very complex situation in that she has a periprosthetic fracture around a stem that appears loose but has not migrated. Under normal circumstances in a community ambulator, the treatment would consist of femoral revision to a long stem with fracture fixation. This  is also made more complicated by the long cement mantle present in her femur distal to the stem, which appears very well fixed. Given her age, medical comorbidities and minimally ambulatory status, the risks of such a major operation are significant and outweigh the benefits that may be derived from surgery. Fortunately the fracture pattern has enough inherent stability to where it can potentially heal with external bracing with a hip abduction orthosis. Another surgical option would be ORIF with a cable plate but this also is not ideal. I or displaces, then we could always opt for surgical fixation. She is definitely in favor of this option. I will order the hip abduction brace. She is to wear it for transfers out of bed and when she is sitting up in a chair or wheelchair. She does not need to wear it while lying in bed as she would be at risk for developing pressure ulcers.She may be touch down weight bearing RLE with the brace on.  Yolanda Baker 05/06/2019, 5:34 PM

## 2019-05-06 NOTE — H&P (Signed)
[facility] History and Physical  Yolanda Baker MWU:[id number] DOB: 04-23-1923 DOA: 05/06/2019  Referring physician: ED  PCP: Rodrigo Ran, MD   Patient is coming from: ALF/SNF  Chief Complaint: Hip pain x 1 day  HPI: Yolanda Baker is a 86 y.o. female has medical history of anxiety, CHF, arthritis, depression, atrial flutter on anticoagulation, hypertension, dementia presented to hospital with complains of fall.  Patient was at her assisted living facility yesterday night when she fell out of bed. The fall was unwitnessed but staff found her on the floor and put her back on bed.  Patient did not complain of anything at that time but in the morning, she started having some right hip pain and had difficulty weightbearing.  Patient denied any dizziness, chest pain,. palpitation shortness of breath prior to the fall.  Denies any recent sickness.  Patient denied any nausea, vomiting or diarrhea.  Denies any urinary urgency, frequency or dysuria.  Patient has been having good appetite.  At baseline, patient can go to the bathroom with assistance but needs help with most of her ADLs.  Patient's daughter at bedside reported that over the last month or so her weakness has progressed.  ED Course: In the ED, patient patient was noted to have a right periprosthetic fracture.  Patient was not in distress.  Orthopedics was consulted and the plan was admission  to the hospital.  Review of Systems:  All systems were reviewed and were negative unless otherwise mentioned in the HPI  Past Medical History:  Diagnosis Date  . Anxiety   . Arthritis    neck  . Bruises easily   . Depression   . Dysrhythmia    hx A-FLUTTER after hip surg April 2015- take metoprolol  . Eczema   . Hip joint pain    RT  . Hypertension   . Memory loss   . Osteoporosis   . Pneumonia    august 2017  . Shortness of breath    with activity  . Stroke [facility])    "mini stroke" per MRI - no deficiets    Past Surgical  History:  Procedure Laterality Date  . ABDOMINAL HYSTERECTOMY    . FRACTURE SURGERY Right 06/05/2013   hip  . HIP ARTHROPLASTY Right 10/03/2013   Procedure: CONVERSION OF PREVIOUS RIGHT HIP SURGERY TO A RIGHT TOTAL HIP ARTHROPLASTY;  Surgeon: Loanne Drilling, MD;  Location: [facility];  Service: Orthopedics;  Laterality: Right;  . INTRAMEDULLARY (IM) NAIL INTERTROCHANTERIC Right 06/05/2013   Procedure: INTRAMEDULLARY (IM) NAIL INTERTROCHANTRIC;  Surgeon: Loanne Drilling, MD;  Location: [facility];  Service: Orthopedics;  Laterality: Right;  . JOINT REPLACEMENT     left hip  . left hip Left 2008  . LEG SURGERY Right    x2    Social History:  reports that she has never smoked. She has never used smokeless tobacco. She reports that she does not drink alcohol or use drugs.  Allergies  Allergen Reactions  . Flagyl [Metronidazole]     Fingers get "numb"    Family History  Problem Relation Age of Onset  . Heart attack Father   . Hypertension Father   . Hypertension Sister   . Stroke Sister   . Hypertension Brother      Prior to Admission medications   Medication Sig Start Date End Date Taking? Authorizing Provider  acetaminophen (TYLENOL) 500 MG tablet Take 1 tablet (500 mg total) by mouth 2 (two) times daily as needed for  mild pain or moderate pain. Patient taking differently: Take 500 mg by mouth 2 (two) times daily.  03/02/15  Yes Leta Baptist, MD  amLODipine (NORVASC) 5 MG tablet Take 5 mg by mouth daily.   Yes [provider]  apixaban (ELIQUIS) 2.5 MG TABS tablet Take 1 tablet (2.5 mg total) by mouth 2 (two) times daily. 07/01/14  Yes Layne Benton, NP  chlorhexidine (PERIDEX) 0.12 % solution Use as directed 15 mLs in the mouth or throat 2 (two) times daily. Swish and spit   Yes [provider]  Chlorpheniramine-APAP (CORICIDIN) 2-325 MG TABS Take 1 tablet by mouth 2 (two) times daily as needed (congestion).   Yes [provider]  citalopram (CELEXA) 10 MG  tablet Take 30 mg by mouth daily.  01/03/15  Yes [provider]  furosemide (LASIX) 20 MG tablet Take 1 tablet (20 mg total) by mouth every Monday, Wednesday, and Friday. Patient taking differently: Take 20 mg by mouth every other day.  02/09/19  Yes Albertine Grates, MD  galantamine (RAZADYNE ER) 8 MG 24 hr capsule Take 8 mg by mouth daily with breakfast.   Yes [provider]  LORazepam (ATIVAN) 0.5 MG tablet Take 1 tablet (0.5 mg total) by mouth 2 (two) times daily. 02/04/19  Yes Swayze, Ava, DO  metoprolol tartrate (LOPRESSOR) 100 MG tablet Take 100 mg by mouth 2 (two) times daily. 05/04/19  Yes [provider]  Multiple Vitamins-Minerals (CERTAVITE SENIOR/ANTIOXIDANT PO) Take 1 tablet by mouth daily.    Yes [provider]  Polyethyl Glycol-Propyl Glycol (SYSTANE) 0.4-0.3 % SOLN Place 1 drop into both eyes every hour as needed (dry eyes/irritation).   Yes [provider]  potassium chloride SA (K-DUR,KLOR-CON) 20 MEQ tablet Take 20 mEq by mouth every other day.   Yes [provider]  psyllium (REGULOID) 0.52 g capsule Take 0.52 g by mouth daily.   Yes [provider]  saline (AYR) GEL Place 1 application into both nostrils at bedtime.   Yes [provider]  simvastatin (ZOCOR) 10 MG tablet Take 1 tablet (10 mg total) by mouth daily at 6 PM. 07/01/14  Yes Biby, Jani Files, NP  sodium chloride (OCEAN) 0.65 % SOLN nasal spray Place 3 sprays into both nostrils 4 (four) times daily.   Yes [provider]  STIOLTO RESPIMAT 2.5-2.5 MCG/ACT AERS Inhale 2 puffs into the lungs daily. 04/17/19  Yes [provider]  ziprasidone (GEODON) 60 MG capsule Take 60 mg by mouth daily.  06/08/15  Yes [provider]  carbidopa-levodopa (SINEMET IR) 10-100 MG tablet Take 1 tablet by mouth 3 (three) times daily. Patient not taking: Reported on 05/06/2019 02/06/19   Albertine Grates, MD  cephALEXin (KEFLEX) 500 MG capsule Take 1 capsule (500 mg  total) by mouth 3 (three) times daily. Patient not taking: Reported on 05/06/2019 04/05/19   Lorre Nick, MD  senna-docusate (SENOKOT-S) 8.6-50 MG tablet Take 1 tablet by mouth at bedtime. Patient not taking: Reported on 05/06/2019 02/07/19   Albertine Grates, MD  Skin Protectants, Misc. (CALAZIME SKIN PROTECTANT EX) Apply 1 application topically See admin instructions. Apply topically to affected area of coccyx and buttocks as needed for rash/irritation    [provider]    Physical Exam: Vitals:   05/06/19 1430 05/06/19 1431 05/06/19 1500 05/06/19 1555  BP: (!) 142/52  (!) 135/52   Pulse: 60 60 62 63  Resp: 17  16   Temp:  TempSrc:      SpO2: 97% 95% 99% 98%   Wt Readings from Last 3 Encounters:  04/15/19 42.2 kg  02/16/19 43 kg  02/07/19 44.7 kg   There is no height or weight on file to calculate BMI.  General: Frail and thinly built, not in obvious distress, elderly fever HENT: Normocephalic, pupils equally reacting to light and accommodation.  No scleral pallor or icterus noted. Oral mucosa is moist.  Chest:  Clear breath sounds.  Diminished breath sounds bilaterally. No crackles or wheezes.  CVS: S1 &S2 heard. No murmur.  Bradycardia.  Murmur noted. Abdomen: Soft, nontender, nondistended.  Bowel sounds are heard.  Liver is not palpable, no abdominal mass palpated Extremities: No cyanosis, clubbing or edema.  Peripheral pulses are palpable.  Right hip in external rotation and partial flexion. Psych: Alert, awake and communicative normal mood CNS:  No cranial nerve deficits.  Power equal in all extremities.    Skin: Warm and dry.  No rashes noted.  Labs on Admission:   CBC: Recent Labs  Lab 05/06/19 1526  WBC 12.4*  NEUTROABS 9.2*  HGB 10.2*  HCT 32.3*  MCV 107.3*  PLT 224    Basic Metabolic Panel: Recent Labs  Lab 05/06/19 1526  NA 136  K 3.9  CL 102  CO2 27  GLUCOSE 121*  BUN 24*  CREATININE 0.80  CALCIUM 8.2*    Liver Function Tests: No  results for input(s): AST, ALT, ALKPHOS, BILITOT, PROT, ALBUMIN in the last 168 hours. No results for input(s): LIPASE, AMYLASE in the last 168 hours. No results for input(s): AMMONIA in the last 168 hours.  Cardiac Enzymes: No results for input(s): CKTOTAL, CKMB, CKMBINDEX, TROPONINI in the last 168 hours.  BNP (last 3 results) Recent Labs    02/02/19 2200  BNP 335.0*    ProBNP (last 3 results) No results for input(s): PROBNP in the last 8760 hours.  CBG: No results for input(s): GLUCAP in the last 168 hours.  Lipase  No results found for: LIPASE   Urinalysis    Component Value Date/Time   COLORURINE YELLOW 04/05/2019 1600   APPEARANCEUR CLEAR 04/05/2019 1600   LABSPEC 1.008 04/05/2019 1600   PHURINE 8.0 04/05/2019 1600   GLUCOSEU NEGATIVE 04/05/2019 1600   HGBUR NEGATIVE 04/05/2019 1600   BILIRUBINUR NEGATIVE 04/05/2019 1600   KETONESUR NEGATIVE 04/05/2019 1600   PROTEINUR NEGATIVE 04/05/2019 1600   UROBILINOGEN 0.2 06/29/2014 0943   NITRITE NEGATIVE 04/05/2019 1600   LEUKOCYTESUR NEGATIVE 04/05/2019 1600     Drugs of Abuse     Component Value Date/Time   LABOPIA NONE DETECTED 06/29/2014 0943   COCAINSCRNUR NONE DETECTED 06/29/2014 0943   LABBENZ NONE DETECTED 06/29/2014 0943   AMPHETMU NONE DETECTED 06/29/2014 0943   THCU NONE DETECTED 06/29/2014 0943   LABBARB NONE DETECTED 06/29/2014 0943      Radiological Exams on Admission: CT Head Wo Contrast  Result Date: 05/06/2019 CLINICAL DATA:  Fall on anticoagulation EXAM: CT HEAD WITHOUT CONTRAST TECHNIQUE: Contiguous axial images were obtained from the base of the skull through the vertex without intravenous contrast. COMPARISON:  06/29/2014 FINDINGS: Brain: No evidence of acute infarction, hemorrhage, hydrocephalus, extra-axial collection or mass lesion/mass effect. Chronic left basal ganglia lacunar infarcts. Extensive low-density changes within the periventricular and subcortical white matter compatible with  chronic microvascular ischemic change. Moderate diffuse cerebral volume loss. Vascular: Atherosclerotic calcifications involving the large vessels of the skull base. No unexpected hyperdense vessel. Skull: Normal. Negative for fracture or focal  lesion. Sinuses/Orbits: No acute finding. Other: None. IMPRESSION: 1.  No acute intracranial findings. 2.  Chronic microvascular ischemic change and cerebral volume loss. Electronically Signed   By: Davina Poke D.O.   On: 05/06/2019 16:36   DG Hip Unilat  With Pelvis 2-3 Views Right  Result Date: 05/06/2019 CLINICAL DATA:  Post fall. Right hip pain with weight-bearing and movement. Slip and fall 2 nights ago getting out of bed. EXAM: DG HIP (WITH OR WITHOUT PELVIS) 2-3V RIGHT COMPARISON:  Pelvis and left hip radiograph 03/02/2015 FINDINGS: Right hip arthroplasty in expected alignment. Acute periprosthetic fracture about the mid distal femoral shaft displacement of approximately 6 mm of the medial cortex. Periprosthetic lucency about the femoral stem is again seen. 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 There is heterotopic ossification about the lateral aspect of the right hip. Left hemiarthroplasty appears intact. The previous left greater trochanteric fracture has healed. Pubic rami appear intact. IMPRESSION: 1. Right hip arthroplasty. Acute displaced periprosthetic fracture about the mid to distal femoral stem. 2. Chronic right acetabular protrusio. Chronic periprosthetic lucency about the femoral stem is again seen, suggesting loosening. 3. Left hip arthroplasty appears intact. Electronically Signed   By: Keith Rake M.D.   On: 05/06/2019 14:36    EKG: Personally reviewed by me which shows atrial fibrillation.  Assessment/Plan Principal Problem:   Periprosthetic fracture around internal prosthetic hip joint Active Problems:   HTN (hypertension)   Atrial flutter (HCC)   Dementia (HCC)   Periprostahetic fracture of the right hip.     Spoke with orthopedics at bedside.  No plan for surgical intervention at this time..  Patient will be given orthopedic brace.  Continue anticoagulation for now.  History of congestive heart failure.  Compensated.  On Lasix alternate day.  We will continue with that.  2D echocardiogram from 02/03/2019 showed ejection fraction of 50 to 55%.  Essential hypertension.  On amlodipine.  We will continue with that.  History of atrial fibrillation, on metoprolol and anticoagulated.  Continue apixaban we will continue with that, rate controlled at this time.  Dementia.  Memory loss.  We will continue supportive care.  On Celexa, Geodon, Ativan, galantamine  DVT Prophylaxis: On Eliquis.  Consultant: Orthopedics Dr Wynelle Link  Code Status: Full code at this time.  I spoke with the patient's daughter about living will and CODE STATUS.  She would like to discuss with the rest of the family about it.  Microbiology none  Antibiotics: None  Family Communication:  Patients' condition and plan of care including tests being ordered have been discussed with the patient and patient's daughter who indicate understanding and agree with the plan.  Disposition Plan: Skilled nursing facility  Severity of Illness: The appropriate patient status for this patient is INPATIENT. Inpatient status is judged to be reasonable and necessary in order to provide the required intensity of service to ensure the patient's safety. The patient's presenting symptoms, physical exam findings, and initial radiographic and laboratory data in the context of their chronic comorbidities is felt to place them at high risk for further clinical deterioration. Furthermore, it is not anticipated that the patient will be medically stable for discharge from the hospital within 2 midnights of admission. The following factors support the patient status of inpatient.  I certify that at the point of admission it is my clinical judgment that the patient will  require inpatient hospital care spanning beyond 2 midnights from the point of admission due to high intensity of service, high  risk for further deterioration and high frequency of surveillance required.   Signed,  Joycelyn Das, MD [facility] 05/06/2019

## 2019-05-06 NOTE — ED Notes (Addendum)
Daughter at bedside.   Patient changed into gown. Clothes in belonging bag at bedside.  Patient comfortable.  Denies pain at this time.

## 2019-05-06 NOTE — Telephone Encounter (Signed)
Prior authorization submitted for CITALOPRAM 10 MG 3/DAY through [facility] quantity limit, approved through 02/26/2020 PA# [id number] [id number]  Submitted through cover my meds  Patient's daughter [name] notified of approval

## 2019-05-06 NOTE — ED Notes (Signed)
Patient made aware we need UA. Patient unable to void at this time.

## 2019-05-06 NOTE — ED Provider Notes (Signed)
Hospitalist services is aware of case and will evaluate for admission.   Wynetta Fines, MD 05/06/19 [phone]

## 2019-05-07 DIAGNOSIS — L8990 Pressure ulcer of unspecified site, unspecified stage: Secondary | ICD-10-CM | POA: Insufficient documentation

## 2019-05-07 LAB — BASIC METABOLIC PANEL
Anion gap: 9 (ref 5–15)
BUN: 25 mg/dL — ABNORMAL HIGH (ref 8–23)
CO2: 25 mmol/L (ref 22–32)
Calcium: 7.9 mg/dL — ABNORMAL LOW (ref 8.9–10.3)
Chloride: 102 mmol/L (ref 98–111)
Creatinine, Ser: 0.84 mg/dL (ref 0.44–1.00)
GFR calc Af Amer: >60 mL/min (ref >60)
GFR calc non Af Amer: 59 mL/min — ABNORMAL LOW (ref >60)
Glucose, Bld: 99 mg/dL (ref 70–99)
Potassium: 3.7 mmol/L (ref 3.5–5.1)
Sodium: 136 mmol/L (ref 135–145)

## 2019-05-07 LAB — CBC
HCT: 29.3 % — ABNORMAL LOW (ref 36.0–46.0)
Hemoglobin: 9.1 g/dL — ABNORMAL LOW (ref 12.0–15.0)
MCH: 33.7 pg (ref 26.0–34.0)
MCHC: 31.1 g/dL (ref 30.0–36.0)
MCV: 108.5 fL — ABNORMAL HIGH (ref 80.0–100.0)
Platelets: 206 10*3/uL (ref 150–400)
RBC: 2.7 MIL/uL — ABNORMAL LOW (ref 3.87–5.11)
RDW: 14.6 % (ref 11.5–15.5)
WBC: 9.6 10*3/uL (ref 4.0–10.5)
nRBC: 0 % (ref 0.0–0.2)

## 2019-05-07 LAB — SARS CORONAVIRUS 2 (TAT 6-24 HRS): SARS Coronavirus 2: NEGATIVE

## 2019-05-07 MED ORDER — ADULT MULTIVITAMIN W/MINERALS CH
1.0000 | ORAL_TABLET | Freq: Every day | ORAL | Status: DC
  Administered 2019-05-08 – 2019-05-10 (×3): 1 via ORAL
  Filled 2019-05-07 (×3): qty 1

## 2019-05-07 NOTE — Plan of Care (Signed)
Brace is being placed on patient at this time. Will continue to monitor.  Problem: Health Behavior/Discharge Planning: Goal: Ability to manage health-related needs will improve Outcome: Progressing   Problem: Activity: Goal: Risk for activity intolerance will decrease Outcome: Progressing   Problem: Pain Managment: Goal: General experience of comfort will improve Outcome: Progressing   Problem: Skin Integrity: Goal: Risk for impaired skin integrity will decrease Outcome: Progressing   Problem: Education: Goal: Verbalization of understanding the information provided (i.e., activity precautions, restrictions, etc) will improve Outcome: Progressing Goal: Individualized Educational Video(s) Outcome: Progressing   Problem: Activity: Goal: Ability to ambulate and perform ADLs will improve Outcome: Progressing   Problem: Clinical Measurements: Goal: Postoperative complications will be avoided or minimized Outcome: Progressing   Problem: Self-Concept: Goal: Ability to maintain and perform role responsibilities to the fullest extent possible will improve Outcome: Progressing   Problem: Pain Management: Goal: Pain level will decrease Outcome: Progressing

## 2019-05-07 NOTE — Progress Notes (Addendum)
PROGRESS NOTE  Yolanda Baker DOB: [birth year] DOA: 05/06/2019 PCP: Rodrigo Ran, MD   LOS: 1 day   Brief narrative: As per HPI,  Yolanda Baker is a 86 y.o. female has medical history of anxiety, CHF, arthritis, depression, atrial flutter on anticoagulation, hypertension, dementia presented to hospital with complains of fall.  Patient was at her assisted living facility yesterday night when she fell out of bed. The fall was unwitnessed but staff found her on the floor and put her back on bed.  Patient did not complain of anything at that time but in the morning, she started having some right hip pain and had difficulty weightbearing.  Patient denied any dizziness, chest pain,. palpitation shortness of breath prior to the fall.  Denies any recent sickness.  Patient denied any nausea, vomiting or diarrhea.  Denies any urinary urgency, frequency or dysuria.  Patient has been having good appetite.  At baseline, patient can go to the bathroom with assistance but needs help with most of her ADLs.  Patient's daughter at bedside reported that over the last month or so her weakness has progressed. ED Course: In the ED, patient patient was noted to have a right periprosthetic fracture.  Patient was not in distress.  Orthopedics was consulted and the plan was admission  to the hospital.  Assessment/Plan:  Principal Problem:   Periprosthetic fracture around internal prosthetic hip joint Active Problems:   HTN (hypertension)   Atrial flutter (HCC)   Dementia (HCC)   Pressure injury of skin  Right periprosthetic fracture of the hip.  Seen by orthopedics.  Plan is to put in a brace and continued nonoperative management.  Orthopedics to manage further care including PT evaluation and ambulation.  History of congestive heart failure.  Compensated.  On Lasix alternate day.   2D echocardiogram from 02/03/2019 showed ejection fraction of 50 to 55%.  Essential hypertension.  On amlodipine.  We will  continue with that.  History of atrial fibrillation,  continue metoprolol, apixaban. rate controlled at this time.  Dementia.  Memory loss.  We will continue supportive care.  On Celexa, Geodon, Ativan, galantamine  VTE Prophylaxis: Eliquis  Code Status: DNR.  Family wished DNR at this time.  Family Communication: None today.  Family wished palliative care consultation will consult palliative care.  Disposition Plan:  . Patient is from assisted living facility . Likely disposition to skilled nursing facility . Barriers to discharge: Pain control, brace placement, PT evaluation and possible skilled nursing facility placement.   Consultants:  Orthopedics  Palliative care  Procedures:  None so far  Antibiotics:  . None  Anti-infectives (From admission, onward)   None     Subjective:  Today, patient was seen and examined at bedside.  Complaints of pain in the hip on moving her hip.  Denies any chest pain, dizziness, shortness of breath.  Objective: Vitals:   05/07/19 0951 05/07/19 1233  BP:  (!) 118/46  Pulse: 65 62  Resp:  18  Temp:  98.6 F (37 C)  SpO2:  96%    Intake/Output Summary (Last 24 hours) at 05/07/2019 1431 Last data filed at 05/07/2019 0508 Gross per 24 hour  Intake 450 ml  Output --  Net 450 ml   Filed Weights   05/06/19 1816  Weight: 46.6 kg   Body mass index is 18.2 kg/m.   Physical Exam: GENERAL: Patient is alert awake and communicative.  Not in obvious distress.  Thin and frail. HENT: No scleral  pallor or icterus. Pupils equally reactive to light. Oral mucosa is moist NECK: is supple, no gross swelling noted. CHEST: Clear to auscultation. No crackles or wheezes.  Diminished breath sounds bilaterally. CVS: S1 and S2 heard, murmur noted ABDOMEN: Soft, non-tender, bowel sounds are present. EXTREMITIES: Right lower extremity in external rotation and partial flexion.  Hip tenderness on palpation. CNS: Cranial nerves are intact. No  focal motor deficits. SKIN: warm and dry without rashes.  Data Review: I have personally reviewed the following laboratory data and studies,  CBC: Recent Labs  Lab 05/06/19 1526 05/07/19 0458  WBC 12.4* 9.6  NEUTROABS 9.2*  --   HGB 10.2* 9.1*  HCT 32.3* 29.3*  MCV 107.3* 108.5*  PLT 224 206   Basic Metabolic Panel: Recent Labs  Lab 05/06/19 1526 05/07/19 0458  NA 136 136  K 3.9 3.7  CL 102 102  CO2 27 25  GLUCOSE 121* 99  BUN 24* 25*  CREATININE 0.80 0.84  CALCIUM 8.2* 7.9*   Liver Function Tests: No results for input(s): AST, ALT, ALKPHOS, BILITOT, PROT, ALBUMIN in the last 168 hours. No results for input(s): LIPASE, AMYLASE in the last 168 hours. No results for input(s): AMMONIA in the last 168 hours. Cardiac Enzymes: No results for input(s): CKTOTAL, CKMB, CKMBINDEX, TROPONINI in the last 168 hours. BNP (last 3 results) Recent Labs    02/02/19 2200  BNP 335.0*    ProBNP (last 3 results) No results for input(s): PROBNP in the last 8760 hours.  CBG: No results for input(s): GLUCAP in the last 168 hours. Recent Results (from the past 240 hour(s))  SARS CORONAVIRUS 2 (TAT 6-24 HRS) Nasopharyngeal Nasopharyngeal Swab     Status: None   Collection Time: 05/06/19  3:26 PM   Specimen: Nasopharyngeal Swab  Result Value Ref Range Status   SARS Coronavirus 2 NEGATIVE NEGATIVE Final    Comment: (NOTE) SARS-CoV-2 target nucleic acids are NOT DETECTED. The SARS-CoV-2 RNA is generally detectable in upper and lower respiratory specimens during the acute phase of infection. Negative results do not preclude SARS-CoV-2 infection, do not rule out co-infections with other pathogens, and should not be used as the sole basis for treatment or other patient management decisions. Negative results must be combined with clinical observations, patient history, and epidemiological information. The expected result is Negative. Fact Sheet for  Patients: HairSlick.no Fact Sheet for Healthcare Providers: quierodirigir.com This test is not yet approved or cleared by the [place] FDA and  has been authorized for detection and/or diagnosis of SARS-CoV-2 by FDA under an Emergency Use Authorization (EUA). This EUA will remain  in effect (meaning this test can be used) for the duration of the COVID-19 declaration under Section 56 4(b)(1) of the Act, 21 U.S.C. section 360bbb-3(b)(1), unless the authorization is terminated or revoked sooner. Performed at [facility], [address], [place], [place] [address]      Studies: CT Head Wo Contrast  Result Date: 05/06/2019 CLINICAL DATA:  Fall on anticoagulation EXAM: CT HEAD WITHOUT CONTRAST TECHNIQUE: Contiguous axial images were obtained from the base of the skull through the vertex without intravenous contrast. COMPARISON:  06/29/2014 FINDINGS: Brain: No evidence of acute infarction, hemorrhage, hydrocephalus, extra-axial collection or mass lesion/mass effect. Chronic left basal ganglia lacunar infarcts. Extensive low-density changes within the periventricular and subcortical white matter compatible with chronic microvascular ischemic change. Moderate diffuse cerebral volume loss. Vascular: Atherosclerotic calcifications involving the large vessels of the skull base. No unexpected hyperdense vessel. Skull: Normal.  Negative for fracture or focal lesion. Sinuses/Orbits: No acute finding. Other: None. IMPRESSION: 1.  No acute intracranial findings. 2.  Chronic microvascular ischemic change and cerebral volume loss. Electronically Signed   By: Davina Poke D.O.   On: 05/06/2019 16:36   DG Hip Unilat  With Pelvis 2-3 Views Right  Result Date: 05/06/2019 CLINICAL DATA:  Post fall. Right hip pain with weight-bearing and movement. Slip and fall 2 nights ago getting out of bed. EXAM: DG HIP (WITH OR WITHOUT PELVIS) 2-3V RIGHT  COMPARISON:  Pelvis and left hip radiograph 03/02/2015 FINDINGS: Right hip arthroplasty in expected alignment. Acute periprosthetic fracture about the mid distal femoral shaft displacement of approximately 6 mm of the medial cortex. Periprosthetic lucency about the femoral stem is again seen. 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 There is heterotopic ossification about the lateral aspect of the right hip. Left hemiarthroplasty appears intact. The previous left greater trochanteric fracture has healed. Pubic rami appear intact. IMPRESSION: 1. Right hip arthroplasty. Acute displaced periprosthetic fracture about the mid to distal femoral stem. 2. Chronic right acetabular protrusio. Chronic periprosthetic lucency about the femoral stem is again seen, suggesting loosening. 3. Left hip arthroplasty appears intact. Electronically Signed   By: Keith Rake M.D.   On: 05/06/2019 14:36      Flora Lipps, MD  [facility] 05/07/2019

## 2019-05-07 NOTE — Progress Notes (Signed)
Verified order for DNR. Discussed with family and patient and they verified wishes. DNR armband placed on patient.

## 2019-05-07 NOTE — TOC Initial Note (Signed)
Transition of Care [facility]) - Initial/Assessment Note    Patient Details  Name: Yolanda Baker MRN: [id number] Date of Birth: 05-Feb-1924  Transition of Care [facility] CM/SW Contact:    Lanier Clam, RN Phone Number: 05/07/2019, 11:34 AM  Clinical Narrative:confirmed patient from [facility] ALF rep [facility] aware. Spoke to patient/dtr in rm about  D/c plans- Prefer to return back to ALF if possible but has gone to SNF in [place].palliative cons, await PT cons & recc.DNR in shadow chart for MD signature. [facility] following for HHRN if needed-wound assessment,monitiroing,oversight rep Kathlene November aware.Facility has contract w/[facility] if only HHPT/OT needed. PTAR @ d/c for transportation.               Expected Discharge Plan: Assisted Living Barriers to Discharge: Continued Medical Work up   Patient Goals and CMS Choice Patient states their goals for this hospitalization and ongoing recovery are:: return back to [facility] CMS Medicare.gov Compare Post Acute Care list provided to:: Patient Represenative (must comment)(dtr Candy Reavis [phone])    Expected Discharge Plan and Services Expected Discharge Plan: Assisted Living   Discharge Planning Services: CM Consult   Living arrangements for the past 2 months: Assisted Living Facility                                      Prior Living Arrangements/Services Living arrangements for the past 2 months: Assisted Living Facility Lives with:: Facility Resident Patient language and need for interpreter reviewed:: Yes Do you feel safe going back to the place where you live?: Yes      Need for Family Participation in Patient Care: No (Comment) Care giver support system in place?: Yes (comment) Current home services: DME(rw,w/c) Criminal Activity/Legal Involvement Pertinent to Current Situation/Hospitalization: No - Comment as needed  Activities of Daily Living Home Assistive Devices/Equipment: Walker (specify type) ADL  Screening (condition at time of admission) Patient's cognitive ability adequate to safely complete daily activities?: Yes Is the patient deaf or have difficulty hearing?: No Does the patient have difficulty seeing, even when wearing glasses/contacts?: No Does the patient have difficulty concentrating, remembering, or making decisions?: No Patient able to express need for assistance with ADLs?: Yes Does the patient have difficulty dressing or bathing?: Yes Independently performs ADLs?: No Communication: Independent Dressing (OT): Needs assistance Is this a change from baseline?: Pre-admission baseline Grooming: Needs assistance Is this a change from baseline?: Pre-admission baseline Feeding: Independent Bathing: Needs assistance Is this a change from baseline?: Pre-admission baseline Toileting: Needs assistance Is this a change from baseline?: Pre-admission baseline In/Out Bed: Needs assistance Is this a change from baseline?: Change from baseline, expected to last <3 days Walks in Home: Needs assistance Is this a change from baseline?: Change from baseline, expected to last <3 days Does the patient have difficulty walking or climbing stairs?: Yes Weakness of Legs: Both Weakness of Arms/Hands: None  Permission Sought/Granted Permission sought to share information with : Case Manager Permission granted to share information with : Yes, Verbal Permission Granted  Share Information with NAME: Case Manager  Permission granted to share info w AGENCY: [facility] if [facility] needed-wound assessment,montoring, oversight or [facility]-contract HHPT/OT @ [employer] granted to share info w Relationship: Candy Reavis [phone]     Emotional Assessment Appearance:: Appears stated age Attitude/Demeanor/Rapport: Gracious Affect (typically observed): Accepting   Alcohol / Substance Use: Not Applicable  Psych Involvement: No (comment)  Admission diagnosis:  Closed fracture of  right femur, unspecified fracture morphology, unspecified portion of femur, initial encounter ([place]) [S72.91XA] Periprosthetic fracture around internal prosthetic hip joint, unspecified laterality, initial encounter [K93.8XXA, [place] Patient Active Problem List   Diagnosis Date Noted  . Pressure injury of skin 05/07/2019  . Periprosthetic fracture around internal prosthetic hip joint 05/06/2019  . DNR (do not resuscitate) present on admission 02/04/2019  . Acute on chronic diastolic heart failure ([place])   . Acute CHF (congestive heart failure) ([place]) 02/03/2019  . Atrial fibrillation with rapid ventricular response ([place]) 02/03/2019  . GAD (generalized anxiety disorder) 01/15/2018  . Schizoaffective disorder ([place]) 01/15/2018  . Hyperlipidemia LDL goal <70 07/01/2014  . Constipation chronic 07/01/2014  . Acute CVA (cerebrovascular accident) ([place])   . Acute blood loss anemia 10/12/2013  . Postoperative anemia due to acute blood loss 10/04/2013  . Postop Transfusion 10/04/2013  . Closed fracture of femur with nonunion 10/03/2013  . Femur fracture, right ([place]) 10/03/2013  . Closed femur fracture ([place]) 10/03/2013  . UTI (urinary tract infection) 06/23/2013  . Dementia ([place]) 06/09/2013  . Anxiety 06/09/2013  . Atrial flutter ([place]) 06/07/2013  . Hip fracture ([place]) 06/04/2013  . Depression 06/04/2013  . Anxiety state 06/04/2013  . HTN (hypertension) 06/04/2013  . Closed right hip fracture ([place]) 06/04/2013   PCP:  Crist Infante, MD Pharmacy:  No Pharmacies Listed    Social Determinants of Health (SDOH) Interventions    Readmission Risk Interventions Readmission Risk Prevention Plan 02/04/2019  Transportation Screening Complete  PCP or Specialist Appt within 5-7 Days Complete  Home Care Screening Complete  Medication Review (RN CM) Complete  Some recent data might be hidden

## 2019-05-07 NOTE — Progress Notes (Signed)
Initial Nutrition Assessment  RD working remotely.   DOCUMENTATION CODES:   Underweight  INTERVENTION:  - continue Ensure Enlive BID, each supplement provides 350 kcal and 20 grams of protein. - will order Magic Cup BID with meals, each supplement provides 290 kcal and 9 grams of protein. - will order daily multivitamin with minerals.    NUTRITION DIAGNOSIS:   Increased nutrient needs related to acute illness as evidenced by estimated needs.  GOAL:   Patient will meet greater than or equal to 90% of their needs  MONITOR:   PO intake, Supplement acceptance, Labs, Weight trends  REASON FOR ASSESSMENT:   Malnutrition Screening Tool  ASSESSMENT:   86 y.o. female has medical history of anxiety, CHF, arthritis, depression, atrial flutter on anticoagulation, HTN, and dementia. She presented to the ED after a fall out of bed at ALF. The next morning patient reported R hip pain and difficulty with weightbearing. She denied any GI-related symptoms and reported good appetite PTA. At baseline, she needs assistance with most ADLs. Her daughter reported progressive weakness over the past 1 month.  No intakes documented since admission. Ensure Enlive was ordered BID and she has accepted both bottles that have been offered to her. Weight yesterday was 103 lb and weight has been up/fairly stable over the past 23 months.   Per notes: - R periprosthetic fx of the hip--plan for brace, non-operative management - hx of dementia with memory loss (currently a/o x3) - family requested Palliative Care consult - from ALF and likely d/c to SNF when medically ready   Labs reviewed; BUN: 25 mg/dl, Ca: 7.9 mg/dl. Medications reviewed; 100 mg colace BID, 20 mg oral lasix/day, 20 mEq Klor-Con/day, 1 packet metamucil/day.     NUTRITION - FOCUSED PHYSICAL EXAM:  unable to complete at this time.   Diet Order:   Diet Order            Diet regular Room service appropriate? Yes; Fluid consistency:  Thin  Diet effective now              EDUCATION NEEDS:   No education needs have been identified at this time  Skin:  Skin Assessment: Skin Integrity Issues: Skin Integrity Issues:: Stage I Stage I: sacrum  Last BM:  3/9  Height:   Ht Readings from Last 1 Encounters:  05/06/19 5\' 3"  (1.6 m)    Weight:   Wt Readings from Last 1 Encounters:  05/06/19 46.6 kg    Ideal Body Weight:  52.3 kg  BMI:  Body mass index is 18.2 kg/m.  Estimated Nutritional Needs:   Kcal:  1400-1600 kcal  Protein:  60-70 grams  Fluid:  >/= 1.7 L/day     07/06/19, MS, RD, LDN, CNSC Inpatient Clinical Dietitian RD pager # available in [facility]  After hours/weekend pager # available in [facility]

## 2019-05-07 NOTE — Progress Notes (Signed)
Subjective: Yolanda Baker feels like she is doing better. No complaints while resting in bed   Objective: Vital signs in last 24 hours: Temp:  [98.3 F (36.8 C)-100.6 F (38.1 C)] 98.3 F (36.8 C) (03/11 0540) Pulse Rate:  [52-63] 55 (03/11 0540) Resp:  [16-24] 17 (03/11 0540) BP: (105-145)/(40-66) 118/53 (03/11 0540) SpO2:  [94 %-100 %] 94 % (03/11 0540) Weight:  [46.6 kg] 46.6 kg (03/10 1816)  Intake/Output from previous day: No intake/output data recorded. Intake/Output this shift: No intake/output data recorded.  Recent Labs    05/06/19 1526 05/07/19 0458  HGB 10.2* 9.1*   Recent Labs    05/06/19 1526 05/07/19 0458  WBC 12.4* 9.6  RBC 3.01* 2.70*  HCT 32.3* 29.3*  PLT 224 206   Recent Labs    05/06/19 1526 05/07/19 0458  NA 136 136  K 3.9 3.7  CL 102 102  CO2 27 25  BUN 24* 25*  CREATININE 0.80 0.84  GLUCOSE 121* 99  CALCIUM 8.2* 7.9*   No results for input(s): LABPT, INR in the last 72 hours.  Resting in bed  Less discomfort with hip rotation  Assessment/Plan: Right periprosthetic femur fracture- Continue non-op management. Brace ordered yesterday and hopefully to arrive today. OOB once brace is on  Ollen Gross 05/07/2019, 7:56 AM

## 2019-05-08 DIAGNOSIS — Z7189 Other specified counseling: Secondary | ICD-10-CM

## 2019-05-08 DIAGNOSIS — Z515 Encounter for palliative care: Secondary | ICD-10-CM

## 2019-05-08 DIAGNOSIS — M9701XD Periprosthetic fracture around internal prosthetic right hip joint, subsequent encounter: Secondary | ICD-10-CM

## 2019-05-08 LAB — CBC
HCT: 30.1 % — ABNORMAL LOW (ref 36.0–46.0)
Hemoglobin: 9.6 g/dL — ABNORMAL LOW (ref 12.0–15.0)
MCH: 34.4 pg — ABNORMAL HIGH (ref 26.0–34.0)
MCHC: 31.9 g/dL (ref 30.0–36.0)
MCV: 107.9 fL — ABNORMAL HIGH (ref 80.0–100.0)
Platelets: 226 10*3/uL (ref 150–400)
RBC: 2.79 MIL/uL — ABNORMAL LOW (ref 3.87–5.11)
RDW: 14.6 % (ref 11.5–15.5)
WBC: 10.8 10*3/uL — ABNORMAL HIGH (ref 4.0–10.5)
nRBC: 0 % (ref 0.0–0.2)

## 2019-05-08 MED ORDER — METOPROLOL TARTRATE 50 MG PO TABS
50.0000 mg | ORAL_TABLET | Freq: Once | ORAL | Status: AC
  Administered 2019-05-08: 50 mg via ORAL
  Filled 2019-05-08: qty 1

## 2019-05-08 MED ORDER — METOPROLOL TARTRATE 50 MG PO TABS
50.0000 mg | ORAL_TABLET | Freq: Two times a day (BID) | ORAL | Status: AC
  Administered 2019-05-08: 50 mg via ORAL
  Filled 2019-05-08: qty 1

## 2019-05-08 NOTE — Progress Notes (Signed)
Subjective: Resting comfortably in bed. Brace on. No complaints   Objective: Vital signs in last 24 hours: Temp:  [98.6 F (37 C)-99.3 F (37.4 C)] 99.1 F (37.3 C) (03/12 0518) Pulse Rate:  [48-108] 94 (03/12 0518) Resp:  [18-24] 24 (03/12 0518) BP: (110-131)/(46-73) 110/69 (03/12 0518) SpO2:  [92 %-96 %] 95 % (03/12 0518)  Intake/Output from previous day: 03/11 0701 - 03/12 0700 In: 300 [P.O.:300] Out: 100 [Urine:100] Intake/Output this shift: No intake/output data recorded.  Recent Labs    05/06/19 1526 05/07/19 0458 05/08/19 0441  HGB 10.2* 9.1* 9.6*   Recent Labs    05/07/19 0458 05/08/19 0441  WBC 9.6 10.8*  RBC 2.70* 2.79*  HCT 29.3* 30.1*  PLT 206 226   Recent Labs    05/06/19 1526 05/07/19 0458  NA 136 136  K 3.9 3.7  CL 102 102  CO2 27 25  BUN 24* 25*  CREATININE 0.80 0.84  GLUCOSE 121* 99  CALCIUM 8.2* 7.9*   No results for input(s): LABPT, INR in the last 72 hours.  Brace intact and well fitting. No pain on motion    Assessment/Plan: Right periprosthetic femur fracture- May transfer OOB to chair with PT with TDWB RLE with brace on. OK for discharge back to her facility if she tolerates the limited mobility     Yolanda Baker 05/08/2019, 7:14 AM

## 2019-05-08 NOTE — Consult Note (Signed)
Consultation Note Date: 05/08/2019   Patient Name: Yolanda Baker  DOB: 1923-06-30  MRN: [id number]  Age / Sex: 86 y.o., female  PCP: Crist Infante, MD Referring Physician: Flora Lipps, MD  Reason for Consultation: Establishing goals of care  HPI/Patient Profile: 86 y.o. female  with past medical history of CHF, atrial fibrillation on Eliquis, HTN, stroke, memory loss/dementia, mood disorder, osteoporosis, history of dysphagia, possible parkinsonism (no improvement noted with Sinemet) admitted on 05/06/2019 with right hip fracture with no plans for surgical intervention.   Clinical Assessment and Goals of Care: I met today with Yolanda Baker and her daughter, Yolanda Baker, at bedside. Yolanda Baker is very pleasant and will nod her head and then she is asked any questions. She will speak if a question is directed to her but always has a big smile. Sometimes her responses can be delayed.   We discussed goals of care and the focus from here. During conversation it seems that they would prefer transition back to [facility] if at all possible - they would be willing to consider supplementing with private caregivers. Yolanda Baker also indicates that she would be more interested in treating what she can in her facility and less interested in coming back to the hospital. I did mention that if this is the goal they could consider help from hospice at facility if the goal is for comfort where she is and not a desire for pursuing more aggressive care. We discussed disposition of return to ALF (with private caregivers and/or hospice), SNF long term care, SNF rehab.   I also gave them MOST form to consider and complete. Decisions expressed for DNR and no feeding tube. Yolanda Baker recalls completing MOST for her father who died at [facility] in 2012/04/29. Family to discuss further. Hard Choices booklet provided as well. Recommend outpatient palliative if no  plans for hospice at this time.   Discussed with Yolanda Baker, Yolanda Baker.   Primary Decision Maker PATIENT with support of adult children    SUMMARY OF RECOMMENDATIONS   - Main concern is disposition - MOST form given and family to discuss - Please engage outpatient palliative or hospice care at next venue  Code Status/Advance Care Planning:  DNR   Symptom Management:   Pain: Vicodin as needed and would recommend premedication with activity/movement.   Psycho-social/Spiritual:   Desire for further Chaplaincy support:no  Additional Recommendations: Caregiving  Support/Resources and Education on Hospice  Prognosis:   Overall prognosis guarded with decreasing functional status and advanced age.   Discharge Planning: To Be Determined      Primary Diagnoses: Present on Admission: . HTN (hypertension) . Atrial flutter ([place]) . Dementia ([place])   I have reviewed the medical record, interviewed the patient and family, and examined the patient. The following aspects are pertinent.  Past Medical History:  Diagnosis Date  . Anxiety   . Arthritis    neck  . Bruises easily   . Depression   . Dysrhythmia    hx A-FLUTTER after hip surg April 2015-  take metoprolol  . Eczema   . Hip joint pain    RT  . Hypertension   . Memory loss   . Osteoporosis   . Pneumonia    august 2017  . Shortness of breath    with activity  . Stroke [facility])    "mini stroke" per MRI - no deficiets    Social History   Socioeconomic History  . Marital status: Widowed    Spouse name: Not on file  . Number of children: 3  . Years of education: 39yrcolleg  . Highest education level: Not on file  Occupational History  . Occupation: Retired   Tobacco Use  . Smoking status: Never Smoker  . Smokeless tobacco: Never Used  Substance and Sexual Activity  . Alcohol use: No    Alcohol/week: 0.0 standard drinks  . Drug use: No  . Sexual activity: Never  Other Topics Concern  . Not on file  Social History  Narrative   Drinks about 3 cups of coffee a day    Social Determinants of Health   Financial Resource Strain:   . Difficulty of Paying Living Expenses:   Food Insecurity:   . Worried About RCharity fundraiserin the Last Year:   . RArboriculturistin the Last Year:   Transportation Needs:   . LFilm/video editor(Medical):   .Marland KitchenLack of Transportation (Non-Medical):   Physical Activity:   . Days of Exercise per Week:   . Minutes of Exercise per Session:   Stress:   . Feeling of Stress :   Social Connections:   . Frequency of Communication with Friends and Family:   . Frequency of Social Gatherings with Friends and Family:   . Attends Religious Services:   . Active Member of Clubs or Organizations:   . Attends CArchivistMeetings:   .Marland KitchenMarital Status:    Family History  Problem Relation Age of Onset  . Heart attack Father   . Hypertension Father   . Hypertension Sister   . Stroke Sister   . Hypertension Brother    Scheduled Meds: . amLODipine  5 mg Oral Daily  . apixaban  2.5 mg Oral BID  . chlorhexidine  15 mL Mouth/Throat BID  . citalopram  30 mg Oral Daily  . docusate sodium  100 mg Oral BID  . feeding supplement (ENSURE ENLIVE)  237 mL Oral BID BM  . furosemide  20 mg Oral QODAY  . galantamine  8 mg Oral Q breakfast  . LORazepam  0.5 mg Oral BID  . metoprolol tartrate  100 mg Oral BID  . multivitamin with minerals  1 tablet Oral Daily  . potassium chloride SA  20 mEq Oral QODAY  . psyllium  1 packet Oral Daily  . simvastatin  10 mg Oral q1800  . sodium chloride  3 spray Each Nare QID  . ziprasidone  60 mg Oral Daily   Continuous Infusions: . methocarbamol (ROBAXIN) IV     PRN Meds:.acetaminophen **OR** acetaminophen, bisacodyl, HYDROcodone-acetaminophen, methocarbamol **OR** methocarbamol (ROBAXIN) IV, morphine injection, polyvinyl alcohol Allergies  Allergen Reactions  . Flagyl [Metronidazole]     Fingers get "numb"   Review of Systems    Constitutional: Positive for activity change. Negative for appetite change.  Respiratory: Negative for shortness of breath.   Neurological: Positive for weakness.    Physical Exam Vitals and nursing note reviewed.  Constitutional:      General: She is not  in acute distress.    Comments: Thin, frail, elderly  Cardiovascular:     Rate and Rhythm: Normal rate.  Pulmonary:     Effort: Pulmonary effort is normal. No tachypnea, accessory muscle usage or respiratory distress.  Abdominal:     General: Abdomen is flat.     Palpations: Abdomen is soft.     Comments: LBM 3/12  Neurological:     Mental Status: She is alert.     Comments: Does not speak much but answers appropriate     Vital Signs: BP 135/79 (BP Location: Left Arm)   Pulse 94   Temp 99.1 F (37.3 C) (Oral)   Resp (!) 24   Ht 5' 3"  (1.6 m)   Wt 46.6 kg   SpO2 95%   BMI 18.20 kg/m  Pain Scale: 0-10   Pain Score: 0-No pain   SpO2: SpO2: 95 % O2 Device:SpO2: 95 % O2 Flow Rate: .   IO: Intake/output summary:   Intake/Output Summary (Last 24 hours) at 05/08/2019 1053 Last data filed at 05/07/2019 1800 Gross per 24 hour  Intake 300 ml  Output 100 ml  Net 200 ml    LBM: Last BM Date: 05/06/19 Baseline Weight: Weight: 46.6 kg Most recent weight: Weight: 46.6 kg     Palliative Assessment/Data:     Time In: 1100 Time Out: 1210 Time Total: 70 min Greater than 50%  of this time was spent counseling and coordinating care related to the above assessment and plan.  Signed by: Vinie Sill, NP Palliative Medicine Team Pager # [phone] (M-F 8a-5p) Team Phone # [phone] (Nights/Weekends)

## 2019-05-08 NOTE — Evaluation (Signed)
Physical Therapy Evaluation Patient Details Name: Yolanda Baker MRN: [id number] DOB: 01/24/1924 Today's Date: 05/08/2019   History of Present Illness  Pt s/p fall with R hip peri-prosthetic fx.  Pt with hx of CVA, memory loss, bil THR and osteoporosis  Clinical Impression  Pt admitted as above and presenting with functional mobility limitations 2* generalized premorbid deconditioning, TWB on R LE, adductor brace and post op pain.  Pt currently requires near total assist of 2 for performance of all mobility tasks and would benefit from follow up rehab at SNF level to maximize IND and safety.    Follow Up Recommendations SNF    Equipment Recommendations  None recommended by PT    Recommendations for Other Services       Precautions / Restrictions Precautions Precautions: Fall;Posterior Hip Required Braces or Orthoses: Other Brace Other Brace: adductor brace for OOB activity Restrictions Weight Bearing Restrictions: Yes RLE Weight Bearing: Touchdown weight bearing      Mobility  Bed Mobility Overal bed mobility: Needs Assistance Bed Mobility: Supine to Sit;Sit to Supine;Rolling Rolling: Max assist   Supine to sit: Max assist;+2 for physical assistance;+2 for safety/equipment Sit to supine: Total assist   General bed mobility comments: increased time and cues; Use of pad to complete transition to EOB sitting and total assist to control return to supine  Transfers                 General transfer comment: EOB sitting only; pt declined to attempt standing or transfers  Ambulation/Gait                Stairs            Wheelchair Mobility    Modified Rankin (Stroke Patients Only)       Balance Overall balance assessment: Needs assistance Sitting-balance support: Bilateral upper extremity supported;Feet supported Sitting balance-Leahy Scale: Poor Sitting balance - Comments: posterior lean; initially requiring mod assist to maintain balance but  progressing to min guard with increased time                                     Pertinent Vitals/Pain Pain Assessment: Faces Faces Pain Scale: Hurts a little bit Pain Location: R hip Pain Descriptors / Indicators: Sore Pain Intervention(s): Limited activity within patient's tolerance;Monitored during session    Home Living Family/patient expects to be discharged to:: Assisted living               Home Equipment: Walker - 2 wheels;Wheelchair - manual      Prior Function Level of Independence: Needs assistance   Gait / Transfers Assistance Needed: walked with RW short distances, otherwise used a w/c  ADL's / Homemaking Assistance Needed: assist with bathing and dressing, could self feed and groom        Hand Dominance   Dominant Hand: Right    Extremity/Trunk Assessment   Upper Extremity Assessment Upper Extremity Assessment: Generalized weakness;RUE deficits/detail;LUE deficits/detail RUE Deficits / Details: Shoulder movement ltd to less than 90 flex bilaterally    Lower Extremity Assessment Lower Extremity Assessment: Generalized weakness;RLE deficits/detail;LLE deficits/detail RLE Deficits / Details: adductor brace in place; all jts with limited movement LLE Deficits / Details: limited movement all jts    Cervical / Trunk Assessment Cervical / Trunk Assessment: Kyphotic  Communication   Communication: No difficulties  Cognition Arousal/Alertness: Awake/alert Behavior During Therapy: WFL for tasks assessed/performed Overall Cognitive  Status: No family/caregiver present to determine baseline cognitive functioning                                        General Comments      Exercises     Assessment/Plan    PT Assessment Patient needs continued PT services  PT Problem List Decreased strength;Decreased range of motion;Decreased activity tolerance;Decreased balance;Decreased mobility;Decreased knowledge of use of  DME;Pain       PT Treatment Interventions DME instruction;Gait training;Stair training;Functional mobility training;Therapeutic activities;Therapeutic exercise;Balance training;Patient/family education    PT Goals (Current goals can be found in the Care Plan section)  Acute Rehab PT Goals Patient Stated Goal: Agreeable to "move a little" PT Goal Formulation: With patient Time For Goal Achievement: 05/22/19 Potential to Achieve Goals: Good    Frequency Min 3X/week   Barriers to discharge        Co-evaluation               AM-PAC PT "6 Clicks" Mobility  Outcome Measure Help needed turning from your back to your side while in a flat bed without using bedrails?: A Lot Help needed moving from lying on your back to sitting on the side of a flat bed without using bedrails?: A Lot Help needed moving to and from a bed to a chair (including a wheelchair)?: Total Help needed standing up from a chair using your arms (e.g., wheelchair or bedside chair)?: Total Help needed to walk in hospital room?: Total Help needed climbing 3-5 steps with a railing? : Total 6 Click Score: 8    End of Session Equipment Utilized During Treatment: Gait belt Activity Tolerance: Patient limited by fatigue Patient left: in bed;with call bell/phone within reach;with bed alarm set Nurse Communication: Mobility status PT Visit Diagnosis: Muscle weakness (generalized) (M62.81);History of falling (Z91.81);Difficulty in walking, not elsewhere classified (R26.2)    Time: 9563-8756 PT Time Calculation (min) (ACUTE ONLY): 26 min   Charges:   PT Evaluation $PT Eval Low Complexity: 1 Low PT Treatments $Therapeutic Activity: 8-22 mins        Mauro Kaufmann PT Acute Rehabilitation Services Pager [phone] Office [phone]   Cordera Stineman 05/08/2019, 12:49 PM

## 2019-05-08 NOTE — Progress Notes (Signed)
PROGRESS NOTE  Yolanda Baker [id number]:[id number] DOB: May 10, 1923 DOA: 05/06/2019 PCP: Crist Infante, MD   LOS: 2 days   Brief narrative: As per HPI,  Yolanda Baker is a 86 y.o. female has medical history of anxiety, CHF, arthritis, depression, atrial flutter on anticoagulation, hypertension, dementia presented to hospital with complains of fall.  Patient was at her assisted living facility yesterday night when she fell out of bed. The fall was unwitnessed but staff found her on the floor and put her back on bed.  Patient did not complain of anything at that time but in the morning, she started having some right hip pain and had difficulty weightbearing.  Patient denied any dizziness, chest pain,. palpitation shortness of breath prior to the fall.  Denies any recent sickness.  Patient denied any nausea, vomiting or diarrhea.  Denies any urinary urgency, frequency or dysuria.  Patient has been having good appetite.  At baseline, patient can go to the bathroom with assistance but needs help with most of her ADLs.  Patient's daughter at bedside reported that over the last month or so her weakness has progressed. ED Course: In the ED, patient patient was noted to have a right periprosthetic fracture.  Patient was not in distress.  Orthopedics was consulted and the plan was admission  to the hospital.  Assessment/Plan:  Principal Problem:   Periprosthetic fracture around internal prosthetic hip joint Active Problems:   HTN (hypertension)   Atrial flutter (HCC)   Dementia (HCC)   Pressure injury of skin  Right periprosthetic fracture of the hip.  Seen by orthopedics under conservative treatment with brace.  Orthopedics recommended ambulation with a brace.  Physical therapy has been consulted..  Will likely need a skilled facility on discharge.  History of congestive heart failure.  Compensated.  On Lasix alternate day.   2D echocardiogram from 02/03/2019 showed ejection fraction of 50 to 55%.  Essential  hypertension.  On amlodipine.  We will continue with that.  History of atrial fibrillation,  on metoprolol, apixaban.   Hold to follow if heart rate less than 50.  Dementia.  Memory loss.    On Celexa, Geodon, Ativan, galantamine  VTE Prophylaxis: Eliquis  Code Status: DNR.    Family Communication: Unable to reach the patient's daughter Ms. Candy at the home phone and cell phone.  Disposition Plan:  . Patient is from assisted living facility . Likely disposition to assisted/skilled nursing facility . Barriers to discharge: Ambulation with brace, PT evaluation and possible skilled nursing facility placement.  Palliative care consultation   Consultants:  Orthopedics  Palliative care  Procedures:  None so far  Antibiotics:  . None  Anti-infectives (From admission, onward)   None     Subjective:  patient was seen and examined at bedside.  Denies overt shortness of breath, chest pain, palpitation.  Has mild pain on movement of the hip.  Objective: Vitals:   05/08/19 0518 05/08/19 0845  BP: 110/69 135/79  Pulse: 94   Resp: (!) 24   Temp: 99.1 F (37.3 C)   SpO2: 95%     Intake/Output Summary (Last 24 hours) at 05/08/2019 1141 Last data filed at 05/07/2019 1800 Gross per 24 hour  Intake 300 ml  Output 100 ml  Net 200 ml   Filed Weights   05/06/19 1816  Weight: 46.6 kg   Body mass index is 18.2 kg/m.   Physical Exam:  GENERAL: Patient is alert awake and communicative.  Not in obvious distress.  Thin and frail. HENT: No scleral pallor or icterus. Pupils equally reactive to light. Oral mucosa is moist NECK: is supple, no gross swelling noted. CHEST: Clear to auscultation. No crackles or wheezes.  Diminished breath sounds bilaterally. CVS: S1 and S2 heard, murmur noted ABDOMEN: Soft, non-tender, bowel sounds are present. EXTREMITIES: Right lower extremity in external rotation and partial flexion.  Hip tenderness on palpation.  On a brace. CNS: Cranial  nerves are intact. No focal motor deficits. SKIN: warm and dry without rashes.  Data Review: I have personally reviewed the following laboratory data and studies,  CBC: Recent Labs  Lab 05/06/19 1526 05/07/19 0458 05/08/19 0441  WBC 12.4* 9.6 10.8*  NEUTROABS 9.2*  --   --   HGB 10.2* 9.1* 9.6*  HCT 32.3* 29.3* 30.1*  MCV 107.3* 108.5* 107.9*  PLT 224 206 226   Basic Metabolic Panel: Recent Labs  Lab 05/06/19 1526 05/07/19 0458  NA 136 136  K 3.9 3.7  CL 102 102  CO2 27 25  GLUCOSE 121* 99  BUN 24* 25*  CREATININE 0.80 0.84  CALCIUM 8.2* 7.9*   Liver Function Tests: No results for input(s): AST, ALT, ALKPHOS, BILITOT, PROT, ALBUMIN in the last 168 hours. No results for input(s): LIPASE, AMYLASE in the last 168 hours. No results for input(s): AMMONIA in the last 168 hours. Cardiac Enzymes: No results for input(s): CKTOTAL, CKMB, CKMBINDEX, TROPONINI in the last 168 hours. BNP (last 3 results) Recent Labs    02/02/19 2200  BNP 335.0*    ProBNP (last 3 results) No results for input(s): PROBNP in the last 8760 hours.  CBG: No results for input(s): GLUCAP in the last 168 hours. Recent Results (from the past 240 hour(s))  SARS CORONAVIRUS 2 (TAT 6-24 HRS) Nasopharyngeal Nasopharyngeal Swab     Status: None   Collection Time: 05/06/19  3:26 PM   Specimen: Nasopharyngeal Swab  Result Value Ref Range Status   SARS Coronavirus 2 NEGATIVE NEGATIVE Final    Comment: (NOTE) SARS-CoV-2 target nucleic acids are NOT DETECTED. The SARS-CoV-2 RNA is generally detectable in upper and lower respiratory specimens during the acute phase of infection. Negative results do not preclude SARS-CoV-2 infection, do not rule out co-infections with other pathogens, and should not be used as the sole basis for treatment or other patient management decisions. Negative results must be combined with clinical observations, patient history, and epidemiological information. The  expected result is Negative. Fact Sheet for Patients: HairSlick.no Fact Sheet for Healthcare Providers: quierodirigir.com This test is not yet approved or cleared by the [place] FDA and  has been authorized for detection and/or diagnosis of SARS-CoV-2 by FDA under an Emergency Use Authorization (EUA). This EUA will remain  in effect (meaning this test can be used) for the duration of the COVID-19 declaration under Section 56 4(b)(1) of the Act, 21 U.S.C. section 360bbb-3(b)(1), unless the authorization is terminated or revoked sooner. Performed at [facility], [address] [place], [place] [address]      Studies: CT Head Wo Contrast  Result Date: 05/06/2019 CLINICAL DATA:  Fall on anticoagulation EXAM: CT HEAD WITHOUT CONTRAST TECHNIQUE: Contiguous axial images were obtained from the base of the skull through the vertex without intravenous contrast. COMPARISON:  06/29/2014 FINDINGS: Brain: No evidence of acute infarction, hemorrhage, hydrocephalus, extra-axial collection or mass lesion/mass effect. Chronic left basal ganglia lacunar infarcts. Extensive low-density changes within the periventricular and subcortical white matter compatible with chronic microvascular ischemic change. Moderate diffuse  cerebral volume loss. Vascular: Atherosclerotic calcifications involving the large vessels of the skull base. No unexpected hyperdense vessel. Skull: Normal. Negative for fracture or focal lesion. Sinuses/Orbits: No acute finding. Other: None. IMPRESSION: 1.  No acute intracranial findings. 2.  Chronic microvascular ischemic change and cerebral volume loss. Electronically Signed   By: Duanne Guess D.O.   On: 05/06/2019 16:36   DG Hip Unilat  With Pelvis 2-3 Views Right  Result Date: 05/06/2019 CLINICAL DATA:  Post fall. Right hip pain with weight-bearing and movement. Slip and fall 2 nights ago getting out of bed. EXAM: DG HIP  (WITH OR WITHOUT PELVIS) 2-3V RIGHT COMPARISON:  Pelvis and left hip radiograph 03/02/2015 FINDINGS: Right hip arthroplasty in expected alignment. Acute periprosthetic fracture about the mid distal femoral shaft displacement of approximately 6 mm of the medial cortex. Periprosthetic lucency about the femoral stem is again seen. 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 There is heterotopic ossification about the lateral aspect of the right hip. Left hemiarthroplasty appears intact. The previous left greater trochanteric fracture has healed. Pubic rami appear intact. IMPRESSION: 1. Right hip arthroplasty. Acute displaced periprosthetic fracture about the mid to distal femoral stem. 2. Chronic right acetabular protrusio. Chronic periprosthetic lucency about the femoral stem is again seen, suggesting loosening. 3. Left hip arthroplasty appears intact. Electronically Signed   By: Narda Rutherford M.D.   On: 05/06/2019 14:36      Joycelyn Das, MD  [facility] 05/08/2019

## 2019-05-08 NOTE — Progress Notes (Signed)
Patient heart rate sustaining >120 BP 135/96.  Patient resting in bed.  Dr. Tyson Babinski notified via text page.

## 2019-05-08 NOTE — TOC Progression Note (Signed)
Transition of Care [facility]) - Progression Note    Patient Details  Name: Yolanda Baker MRN: [id number] Date of Birth: 1923/05/29  Transition of Care [facility]) CM/SW Contact  Lennart Pall, LCSW Phone Number: 05/08/2019, 4:06 PM  Clinical Narrative:   Met with pt's daughter, [name], at bedside and she states that the preferred d/c plan is for pt to return to ALF as they are willing to hire private duty caregivers to provide additional support.  I have left a VM for [place], [place], @ [phone], to see if this is an option.  Will have her review PT notes to help with facility decision.  If they decide they cannot provide care as family has proposed, then daughter would like for me to pursue SNF.    Expected Discharge Plan: Assisted Living Barriers to Discharge: Continued Medical Work up  Expected Discharge Plan and Services Expected Discharge Plan: Assisted Living   Discharge Planning Services: CM Consult   Living arrangements for the past 2 months: Assisted Living Facility                                       Social Determinants of Health (SDOH) Interventions    Readmission Risk Interventions Readmission Risk Prevention Plan 05/07/2019 02/04/2019  Transportation Screening Complete Complete  PCP or Specialist Appt within 5-7 Days - Complete  PCP or Specialist Appt within 3-5 Days Complete -  Home Care Screening - Complete  Medication Review (RN CM) - Complete  HRI or Home Care Consult Complete -  Social Work Consult for Recovery Care Planning/Counseling Complete -  Palliative Care Screening Complete -  Medication Review Press photographer) Complete -  Some recent data might be hidden

## 2019-05-09 NOTE — NC FL2 (Signed)
[place] MEDICAID FL2 LEVEL OF CARE SCREENING TOOL     IDENTIFICATION  Patient Name: Yolanda Baker Birthdate: 05-27-1923 Sex: female Admission Date (Current Location): 05/06/2019  [facility] and [place] Number:  Herbalist and Address:  [facility],  [place] [address], [place]      Provider Number: [id number]  Attending Physician Name and Address:  Flora Lipps, MD  Relative Name and Phone Number:  Reavis,Candy Daughter [phone]  [phone]    Current Level of Care: Hospital Recommended Level of Care: [place] Prior Approval Number:    Date Approved/Denied:   PASRR Number:    Discharge Plan: Other (Comment)(ALF)    Current Diagnoses: Patient Active Problem List   Diagnosis Date Noted  . Goals of care, counseling/discussion   . Palliative care by specialist   . Pressure injury of skin 05/07/2019  . Periprosthetic fracture around internal prosthetic hip joint 05/06/2019  . DNR (do not resuscitate) present on admission 02/04/2019  . Acute on chronic diastolic heart failure ([place])   . Acute CHF (congestive heart failure) ([place]) 02/03/2019  . Atrial fibrillation with rapid ventricular response ([place]) 02/03/2019  . GAD (generalized anxiety disorder) 01/15/2018  . Schizoaffective disorder ([place]) 01/15/2018  . Hyperlipidemia LDL goal <70 07/01/2014  . Constipation chronic 07/01/2014  . Acute CVA (cerebrovascular accident) ([place])   . Acute blood loss anemia 10/12/2013  . Postoperative anemia due to acute blood loss 10/04/2013  . Postop Transfusion 10/04/2013  . Closed fracture of femur with nonunion 10/03/2013  . Femur fracture, right ([place]) 10/03/2013  . Closed femur fracture ([place]) 10/03/2013  . UTI (urinary tract infection) 06/23/2013  . Dementia ([place]) 06/09/2013  . Anxiety 06/09/2013  . Atrial flutter ([place]) 06/07/2013  . Hip fracture ([place]) 06/04/2013  . Depression 06/04/2013  . Anxiety state 06/04/2013  . HTN  (hypertension) 06/04/2013  . Closed right hip fracture ([place]) 06/04/2013    Orientation RESPIRATION BLADDER Height & Weight     Self, Place  Normal Incontinent Weight: 102 lb 11.8 oz (46.6 kg) Height:  5\' 3"  (160 cm)  BEHAVIORAL SYMPTOMS/MOOD NEUROLOGICAL BOWEL NUTRITION STATUS      Incontinent (Regular)  AMBULATORY STATUS COMMUNICATION OF NEEDS Skin   Total Care Verbally                         Personal Care Assistance Level of Assistance  Bathing, Feeding, Dressing Bathing Assistance: Maximum assistance Feeding assistance: Maximum assistance Dressing Assistance: Maximum assistance     Functional Limitations Info  Sight, Hearing, Speech Sight Info: Adequate Hearing Info: Impaired Speech Info: Adequate    SPECIAL CARE FACTORS FREQUENCY        PT Frequency: 3x              Contractures Contractures Info: Not present    Additional Factors Info  Psychotropic, Code Status, Allergies Code Status Info: DNR Allergies Info: Allergies: Flagyl Metronidazole Psychotropic Info: Geodon, Ativan, Celexa         Current Medications (05/09/2019):  This is the current hospital active medication list Current Facility-Administered Medications  Medication Dose Route Frequency Provider Last Rate Last Admin  . acetaminophen (TYLENOL) tablet 650 mg  650 mg Oral Q6H PRN Lang Snow, FNP       Or  . acetaminophen (TYLENOL) suppository 650 mg  650 mg Rectal Q4H PRN Lang Snow, FNP      . amLODipine (NORVASC) tablet 5 mg  5 mg Oral Daily  Pokhrel, Laxman, MD   5 mg at 05/09/19 0901  . apixaban (ELIQUIS) tablet 2.5 mg  2.5 mg Oral BID Pokhrel, Laxman, MD   2.5 mg at 05/09/19 0901  . bisacodyl (DULCOLAX) suppository 10 mg  10 mg Rectal Daily PRN Pokhrel, Laxman, MD      . chlorhexidine (PERIDEX) 0.12 % solution 15 mL  15 mL Mouth/Throat BID Pokhrel, Laxman, MD   15 mL at 05/08/19 2017  . citalopram (CELEXA) tablet 30 mg  30 mg Oral Daily Pokhrel, Laxman, MD   30 mg at 05/09/19  0902  . docusate sodium (COLACE) capsule 100 mg  100 mg Oral BID Pokhrel, Laxman, MD   100 mg at 05/09/19 0901  . feeding supplement (ENSURE ENLIVE) (ENSURE ENLIVE) liquid 237 mL  237 mL Oral BID BM Pokhrel, Laxman, MD   237 mL at 05/09/19 0908  . furosemide (LASIX) tablet 20 mg  20 mg Oral QODAY Pokhrel, Laxman, MD   20 mg at 05/08/19 1206  . galantamine (RAZADYNE ER) 24 hr capsule 8 mg  8 mg Oral Q breakfast Pokhrel, Laxman, MD   8 mg at 05/09/19 0901  . HYDROcodone-acetaminophen (NORCO/VICODIN) 5-325 MG per tablet 1-2 tablet  1-2 tablet Oral Q6H PRN Pokhrel, Laxman, MD   1 tablet at 05/08/19 1237  . LORazepam (ATIVAN) tablet 0.5 mg  0.5 mg Oral BID Pokhrel, Laxman, MD   0.5 mg at 05/09/19 0901  . methocarbamol (ROBAXIN) tablet 500 mg  500 mg Oral Q6H PRN Pokhrel, Laxman, MD       Or  . methocarbamol (ROBAXIN) 500 mg in dextrose 5 % 50 mL IVPB  500 mg Intravenous Q6H PRN Pokhrel, Laxman, MD      . metoprolol tartrate (LOPRESSOR) tablet 100 mg  100 mg Oral BID Pokhrel, Laxman, MD   100 mg at 05/09/19 0902  . morphine 2 MG/ML injection 0.5 mg  0.5 mg Intravenous Q2H PRN Pokhrel, Laxman, MD      . multivitamin with minerals tablet 1 tablet  1 tablet Oral Daily Pokhrel, Laxman, MD   1 tablet at 05/09/19 0902  . polyvinyl alcohol (LIQUIFILM TEARS) 1.4 % ophthalmic solution 1 drop  1 drop Both Eyes PRN Pokhrel, Laxman, MD      . potassium chloride SA (KLOR-CON) CR tablet 20 mEq  20 mEq Oral QODAY Pokhrel, Laxman, MD   20 mEq at 05/08/19 0837  . psyllium (HYDROCIL/METAMUCIL) packet 1 packet  1 packet Oral Daily Pokhrel, Laxman, MD   1 packet at 05/09/19 0902  . simvastatin (ZOCOR) tablet 10 mg  10 mg Oral q1800 Pokhrel, Laxman, MD   10 mg at 05/08/19 1758  . sodium chloride (OCEAN) 0.65 % nasal spray 3 spray  3 spray Each Nare QID Pokhrel, Laxman, MD   3 spray at 05/08/19 2017  . ziprasidone (GEODON) capsule 60 mg  60 mg Oral Daily Pokhrel, Laxman, MD   60 mg at 05/09/19 0901     Discharge  Medications: Please see discharge summary for a list of discharge medications.  Relevant Imaging Results:  Relevant Lab Results:   Additional Information SS#[id number]  [place], [place]

## 2019-05-09 NOTE — Progress Notes (Signed)
PROGRESS NOTE  Yolanda Baker PQZ:[id number] DOB: 24-Jul-1923 DOA: 05/06/2019 PCP: Crist Infante, MD   LOS: 3 days   Brief narrative: As per HPI,  Yolanda Baker is a 86 y.o. female has medical history of anxiety, CHF, arthritis, depression, atrial flutter on anticoagulation, hypertension, dementia presented to hospital with complains of fall.  Patient was at her assisted living facility yesterday night when she fell out of bed. The fall was unwitnessed but staff found her on the floor and put her back on bed.  Patient did not complain of anything at that time but in the morning, she started having some right hip pain and had difficulty weightbearing.  Patient denied any dizziness, chest pain,. palpitation shortness of breath prior to the fall.  Denies any recent sickness.  Patient denied any nausea, vomiting or diarrhea.  Denies any urinary urgency, frequency or dysuria.  Patient has been having good appetite.  At baseline, patient can go to the bathroom with assistance but needs help with most of her ADLs.  Patient's daughter at bedside reported that over the last month or so her weakness has progressed. ED Course: In the ED, patient patient was noted to have a right periprosthetic fracture.  Patient was not in distress.  Orthopedics was consulted and the plan was admission  to the hospital.  Assessment/Plan:  Principal Problem:   Periprosthetic fracture around internal prosthetic hip joint Active Problems:   HTN (hypertension)   Atrial flutter (HCC)   Dementia (HCC)   Pressure injury of skin   Goals of care, counseling/discussion   Palliative care by specialist  Right periprosthetic fracture of the hip.  Seen by orthopedics and recommend  conservative treatment with brace and ambulation with physical therapy..  Patient was seen by physical therapy yesterday and recommended skilled nursing facility for rehabilitation.  History of congestive heart failure.  Compensated.  On Lasix alternate day.    2D echocardiogram from 02/03/2019 showed ejection fraction of 50 to 55%.  Essential hypertension.  On amlodipine.    Blood pressure is controlled.  History of atrial fibrillation with RVR,  on metoprolol, apixaban.   Continue metoprolol p.o. twice daily.  Currently heart rate is around 78 and is controlled.  Dementia.  Memory loss.    On Celexa, Geodon, Ativan, galantamine  VTE Prophylaxis: Eliquis  Code Status: DNR.    Family Communication: I spoke with the patient's daughter Ms. Candy on the phone and updated her about the clinical condition of the patient  Disposition Plan:  . Patient is from assisted living facility . Likely disposition to skilled nursing facility.  Patient has been seen by physical therapy who recommended skilled nursing facility placement. . Medically stable for disposition. . Barriers to discharge: Ambulation with brace, possible skilled nursing facility placement  Consultants:  Orthopedics  Palliative care  Procedures:  Brace  Antibiotics:  . None  Anti-infectives (From admission, onward)   None     Subjective: Patient was seen and examined at bedside. Denies shortness of breath, overt pain, chest pain, dizziness, lightheadedness.   Objective: Vitals:   05/09/19 0825 05/09/19 0901  BP: 129/77   Pulse: (!) 111 (!) 111  Resp: 20   Temp: 99.4 F (37.4 C)   SpO2: 97%     Intake/Output Summary (Last 24 hours) at 05/09/2019 1104 Last data filed at 05/08/2019 1806 Gross per 24 hour  Intake --  Output 500 ml  Net -500 ml   Filed Weights   05/06/19 1816  Weight:  46.6 kg   Body mass index is 18.2 kg/m.   Physical Exam:  GENERAL: Patient is alert awake and communicative.  Not in obvious distress.  Thin and frail. HENT: No scleral pallor or icterus. Pupils equally reactive to light. Oral mucosa is moist NECK: is supple, no gross swelling noted. CHEST: Clear to auscultation. No crackles or wheezes.  Diminished breath sounds  bilaterally. CVS: S1 and S2 heard, murmur noted, irregular heart rate. ABDOMEN: Soft, non-tender, bowel sounds are present. EXTREMITIES: Right lower extremity in external rotation and partial flexion, brace.  Hip tenderness on palpation.  CNS: Cranial nerves are intact. No focal motor deficits. SKIN: warm and dry without rashes.  Data Review: I have personally reviewed the following laboratory data and studies,  CBC: Recent Labs  Lab 05/06/19 1526 05/07/19 0458 05/08/19 0441  WBC 12.4* 9.6 10.8*  NEUTROABS 9.2*  --   --   HGB 10.2* 9.1* 9.6*  HCT 32.3* 29.3* 30.1*  MCV 107.3* 108.5* 107.9*  PLT 224 206 226   Basic Metabolic Panel: Recent Labs  Lab 05/06/19 1526 05/07/19 0458  NA 136 136  K 3.9 3.7  CL 102 102  CO2 27 25  GLUCOSE 121* 99  BUN 24* 25*  CREATININE 0.80 0.84  CALCIUM 8.2* 7.9*   Liver Function Tests: No results for input(s): AST, ALT, ALKPHOS, BILITOT, PROT, ALBUMIN in the last 168 hours. No results for input(s): LIPASE, AMYLASE in the last 168 hours. No results for input(s): AMMONIA in the last 168 hours. Cardiac Enzymes: No results for input(s): CKTOTAL, CKMB, CKMBINDEX, TROPONINI in the last 168 hours. BNP (last 3 results) Recent Labs    02/02/19 2200  BNP 335.0*    ProBNP (last 3 results) No results for input(s): PROBNP in the last 8760 hours.  CBG: No results for input(s): GLUCAP in the last 168 hours. Recent Results (from the past 240 hour(s))  SARS CORONAVIRUS 2 (TAT 6-24 HRS) Nasopharyngeal Nasopharyngeal Swab     Status: None   Collection Time: 05/06/19  3:26 PM   Specimen: Nasopharyngeal Swab  Result Value Ref Range Status   SARS Coronavirus 2 NEGATIVE NEGATIVE Final    Comment: (NOTE) SARS-CoV-2 target nucleic acids are NOT DETECTED. The SARS-CoV-2 RNA is generally detectable in upper and lower respiratory specimens during the acute phase of infection. Negative results do not preclude SARS-CoV-2 infection, do not rule  out co-infections with other pathogens, and should not be used as the sole basis for treatment or other patient management decisions. Negative results must be combined with clinical observations, patient history, and epidemiological information. The expected result is Negative. Fact Sheet for Patients: HairSlick.no Fact Sheet for Healthcare Providers: quierodirigir.com This test is not yet approved or cleared by the [place] FDA and  has been authorized for detection and/or diagnosis of SARS-CoV-2 by FDA under an Emergency Use Authorization (EUA). This EUA will remain  in effect (meaning this test can be used) for the duration of the COVID-19 declaration under Section 56 4(b)(1) of the Act, 21 U.S.C. section 360bbb-3(b)(1), unless the authorization is terminated or revoked sooner. Performed at [facility], [address], [place], [place] [address]      Studies: No results found.    Joycelyn Das, MD  [facility] 05/09/2019

## 2019-05-09 NOTE — TOC Progression Note (Addendum)
Transition of Care [facility]) - Progression Note    Patient Details  Name: Yolanda Baker MRN: [id number] Date of Birth: 26-Mar-1923  Transition of Care [facility]) CM/SW Contact  Clearance Coots, LCSW Phone Number: 05/09/2019, 2:20 PM  Clinical Narrative:   Patient medically stable to discharge per physician note.   Discussed discharge plans with the patient daughter Yolanda Baker.  -Facility request, the patient return with hospital bed, Private Care.  -Daughter has requested Hospice services through [facility] Daughter has arranged Private Care, and has a personal hospital bed that she plans to deliver to the facility tomorrow.  CSW made a referral to [facility]ney, will follow up with the patient daughter.   CSW reached out to facility coordinator Imman [phone] to confirm plan. She reports the facility pharmacy is closed on the weekend, therefore the daughter will have to pick up any new prescibed medications at a preferred pharmacy.  CSW confirm daughter will use [facility] on [address] [place], [place], [place] [address]. Physician notified of the plan.   The facility will need a signed FL2 with list of d/c medications attached. TOC staff will coordinate.    Expected Discharge Plan: Assisted Living Barriers to Discharge: Continued Medical Work up  Expected Discharge Plan and Services Expected Discharge Plan: Assisted Living   Discharge Planning Services: CM Consult   Living arrangements for the past 2 months: Assisted Living Facility                                       Social Determinants of Health (SDOH) Interventions    Readmission Risk Interventions Readmission Risk Prevention Plan 05/07/2019 02/04/2019  Transportation Screening Complete Complete  PCP or Specialist Appt within 5-7 Days - Complete  PCP or Specialist Appt within 3-5 Days Complete -  Home Care Screening - Complete  Medication Review (RN CM) - Complete  HRI or Home Care Consult Complete -   Social Work Consult for Recovery Care Planning/Counseling Complete -  Palliative Care Screening Complete -  Medication Review Oceanographer) Complete -  Some recent data might be hidden

## 2019-05-09 NOTE — Progress Notes (Signed)
Civil engineer, contracting [facility])  Received referral from [facility], patient discharging to [facility]. Spoke with daughter [name] and confirmed that she would like hospice services.   DME needs discussed and patient currently has hospital bed, BSC, shower chair, walker, and wheelchair. Per daughter patient does not need any further DME.  Patient plan for discharge is expected to be Monday 05/11/2019.   [facility] referral center aware of the above. Please fax completed discharge summary to [phone] when final. Please notify hospital liaison or call [phone] when patient is ready to leave the unit.  Please call with hospice related questions.  Thank you for referral.  Yolande Jolly, BSN, [facility]

## 2019-05-10 DIAGNOSIS — I4892 Unspecified atrial flutter: Secondary | ICD-10-CM

## 2019-05-10 MED ORDER — METHOCARBAMOL 500 MG PO TABS: 500.0000 mg | ORAL_TABLET | Freq: Four times a day (QID) | ORAL | 0 refills | Status: DC | PRN

## 2019-05-10 MED ORDER — HYDROCODONE-ACETAMINOPHEN 5-325 MG PO TABS: 1.0000 | ORAL_TABLET | Freq: Four times a day (QID) | ORAL | 0 refills | Status: DC | PRN

## 2019-05-10 NOTE — TOC Transition Note (Addendum)
Transition of Care [facility]) - CM/SW Discharge Note   Patient Details  Name: Yolanda Baker MRN: [id number] Date of Birth: Feb 19, 1924  Transition of Care [facility]) CM/SW Contact:  Marina Goodell Phone Number: [phone] 05/10/2019, 2:50 PM   Clinical Narrative:     Patient will discharge to [facility], Room 111, [phone] or [phone] on 05/10/2019.  This SW faxed Fl2, home health order and discharge summary to [phone] ATT: Med Tech on 05/10/2019. This SW contacted [facility] and spoke with Francena Hanly, Med Tech for update status.  Francena Hanly requested the [facility] floor nurse Tressie Stalker, RN contact Amy, Med Tech at [phone] for order.  This SW spoke with Tressie Stalker, Charity fundraiser and gave her the contact information for [facility].  This SW also confirmed with Tressie Stalker, RN SW will set transportation for patient via [facility] to [facility]. [facility] has been contacted, as per Scott, ETA 45 - 60 minutes.  Final next level of care: Assisted Living Barriers to Discharge: Continued Medical Work up   Patient Goals and CMS Choice Patient states their goals for this hospitalization and ongoing recovery are:: return back to [facility] CMS Medicare.gov Compare Post Acute Care list provided to:: Patient Represenative (must comment)(dtr Candy Reavis [phone])    Discharge Placement                       Discharge Plan and Services   Discharge Planning Services: CM Consult                                 Social Determinants of Health (SDOH) Interventions     Readmission Risk Interventions Readmission Risk Prevention Plan 05/07/2019 02/04/2019  Transportation Screening Complete Complete  PCP or Specialist Appt within 5-7 Days - Complete  PCP or Specialist Appt within 3-5 Days Complete -  Home Care Screening - Complete  Medication Review (RN CM) - Complete  HRI or Home Care Consult Complete -  Social Work Consult for Recovery Care Planning/Counseling Complete -  Palliative Care  Screening Complete -  Medication Review Oceanographer) Complete -  Some recent data might be hidden

## 2019-05-10 NOTE — TOC Progression Note (Signed)
Transition of Care [facility]) - Progression Note    Patient Details  Name: Yolanda Baker MRN: [id number] Date of Birth: 1923/08/09  Transition of Care [facility]) CM/SW Contact  Marina Goodell Phone Number: [phone] 05/10/2019, 11:22 AM  Clinical Narrative:    Received IM from Lupita Raider, patient has bed placement at [facility].  This SW spoke with [place] at [facility] and was told by Ireland this patient needs PT order and  SNF.  This SW, contacted attending MD with update on patient status.   Expected Discharge Plan: Assisted Living Barriers to Discharge: Continued Medical Work up  Expected Discharge Plan and Services Expected Discharge Plan: Assisted Living   Discharge Planning Services: CM Consult   Living arrangements for the past 2 months: Assisted Living Facility                                       Social Determinants of Health (SDOH) Interventions    Readmission Risk Interventions Readmission Risk Prevention Plan 05/07/2019 02/04/2019  Transportation Screening Complete Complete  PCP or Specialist Appt within 5-7 Days - Complete  PCP or Specialist Appt within 3-5 Days Complete -  Home Care Screening - Complete  Medication Review (RN CM) - Complete  HRI or Home Care Consult Complete -  Social Work Consult for Recovery Care Planning/Counseling Complete -  Palliative Care Screening Complete -  Medication Review Oceanographer) Complete -  Some recent data might be hidden

## 2019-05-10 NOTE — Progress Notes (Addendum)
PROGRESS NOTE  Ercel Normoyle Hensarling ZOX:[id number] DOB: 1923-04-03 DOA: 05/06/2019 PCP: Rodrigo Ran, MD   LOS: 4 days   Brief narrative: As per HPI,  Yolanda Baker is a 86 y.o. female has medical history of anxiety, CHF, arthritis, depression, atrial flutter on anticoagulation, hypertension, dementia presented to hospital with complains of fall.  Patient was at her assisted living facility  when she fell out of bed. The fall was unwitnessed but staff found her on the floor and put her back on bed.  Patient did not complain of anything at that time but in the morning, she started having some right hip pain and had difficulty weightbearing.  Patient denied any dizziness, chest pain,. palpitation shortness of breath prior to the fall.  Denies any recent sickness.  Patient denied any nausea, vomiting or diarrhea.  Denies any urinary urgency, frequency or dysuria.  Patient has been having good appetite.  At baseline, patient can go to the bathroom with assistance but needs help with most of her ADLs.  Patient's daughter at bedside reported that over the last month or so her weakness has progressed. ED Course: In the ED, patient patient was noted to have a right periprosthetic fracture.  Patient was not in distress.  Orthopedics was consulted and the plan was admission  to the hospital.  Assessment/Plan:  Principal Problem:   Periprosthetic fracture around internal prosthetic hip joint Active Problems:   HTN (hypertension)   Atrial flutter (HCC)   Dementia (HCC)   Pressure injury of skin   Goals of care, counseling/discussion   Palliative care by specialist  Right periprosthetic fracture of the hip.  Seen by orthopedics and recommended conservative treatment with brace and ambulation with physical therapy.  Patient was seen by physical therapy and recommended skilled nursing facility for rehabilitation.  History of congestive heart failure.  Compensated.  On Lasix alternate day.   2D echocardiogram from  02/03/2019 showed ejection fraction of 50 to 55%.  Essential hypertension.  On amlodipine.    Blood pressure is controlled.  History of atrial fibrillation with RVR,  on metoprolol, apixaban.   Continue metoprolol p.o. twice daily.  Currently heart rate is controlled.  Dementia.   On Celexa, Geodon, Ativan, galantamine  Stage I pressure ulceration present on admission.  Continue prevention protocol.  VTE Prophylaxis: Eliquis  Code Status: DNR.    Family Communication: None today.  I spoke with the patient's daughter Ms. Candy yesterday.  Disposition Plan:  . Patient is from assisted living facility . Likely disposition to skilled nursing facility with hospice likely 05/11/2019.  Patient has been seen by physical therapy who recommended skilled nursing facility placement. . Medically stable for disposition. . Barriers to discharge: Ambulation with brace, possible skilled nursing facility placement on 05/11/2018  Consultants:  Orthopedics  Palliative care  Procedures:  Brace  Antibiotics:  . None  Anti-infectives (From admission, onward)   None     Subjective: Today, patient was seen and examined at bedside.  Patient feels okay.  Patient denies any shortness of breath, cough, fever or overt pain.  Objective: Vitals:   05/09/19 1344 05/10/19 0953  BP: 119/71 126/86  Pulse: 97 81  Resp: 14   Temp: 99.2 F (37.3 C)   SpO2: 100%     Intake/Output Summary (Last 24 hours) at 05/10/2019 1115 Last data filed at 05/09/2019 1418 Gross per 24 hour  Intake --  Output 475 ml  Net -475 ml   Filed Weights   05/06/19 1816  Weight: 46.6 kg   Body mass index is 18.2 kg/m.   Physical Exam:  General: Thin and frail, not in obvious distress HENT: Normocephalic, pupils equally reacting to light and accommodation.  No scleral pallor or icterus noted. Oral mucosa is moist.  Chest:  Clear breath sounds.  Diminished breath sounds bilaterally. No crackles or wheezes.  CVS: S1  &S2 heard. No murmur.  Regular rate and rhythm. Abdomen: Soft, nontender, nondistended.  Bowel sounds are heard. Extremities: No cyanosis, clubbing or edema.  Peripheral pulses are palpable.  Right lower extremity in external rotation on a brace.  Hip tenderness on palpation  Psych: Alert, awake and oriented, normal mood CNS:  No cranial nerve deficits.  Power equal in all extremities.  No sensory deficits noted.  No cerebellar signs.   Skin: Warm and dry.  No rashes noted.   Data Review: I have personally reviewed the following laboratory data and studies,  CBC: Recent Labs  Lab 05/06/19 1526 05/07/19 0458 05/08/19 0441  WBC 12.4* 9.6 10.8*  NEUTROABS 9.2*  --   --   HGB 10.2* 9.1* 9.6*  HCT 32.3* 29.3* 30.1*  MCV 107.3* 108.5* 107.9*  PLT 224 206 852   Basic Metabolic Panel: Recent Labs  Lab 05/06/19 1526 05/07/19 0458  NA 136 136  K 3.9 3.7  CL 102 102  CO2 27 25  GLUCOSE 121* 99  BUN 24* 25*  CREATININE 0.80 0.84  CALCIUM 8.2* 7.9*   Liver Function Tests: No results for input(s): AST, ALT, ALKPHOS, BILITOT, PROT, ALBUMIN in the last 168 hours. No results for input(s): LIPASE, AMYLASE in the last 168 hours. No results for input(s): AMMONIA in the last 168 hours. Cardiac Enzymes: No results for input(s): CKTOTAL, CKMB, CKMBINDEX, TROPONINI in the last 168 hours. BNP (last 3 results) Recent Labs    02/02/19 2200  BNP 335.0*    ProBNP (last 3 results) No results for input(s): PROBNP in the last 8760 hours.  CBG: No results for input(s): GLUCAP in the last 168 hours. Recent Results (from the past 240 hour(s))  SARS CORONAVIRUS 2 (TAT 6-24 HRS) Nasopharyngeal Nasopharyngeal Swab     Status: None   Collection Time: 05/06/19  3:26 PM   Specimen: Nasopharyngeal Swab  Result Value Ref Range Status   SARS Coronavirus 2 NEGATIVE NEGATIVE Final    Comment: (NOTE) SARS-CoV-2 target nucleic acids are NOT DETECTED. The SARS-CoV-2 RNA is generally detectable in upper  and lower respiratory specimens during the acute phase of infection. Negative results do not preclude SARS-CoV-2 infection, do not rule out co-infections with other pathogens, and should not be used as the sole basis for treatment or other patient management decisions. Negative results must be combined with clinical observations, patient history, and epidemiological information. The expected result is Negative. Fact Sheet for Patients: SugarRoll.be Fact Sheet for Healthcare Providers: https://www.woods-mathews.com/ This test is not yet approved or cleared by the [place] FDA and  has been authorized for detection and/or diagnosis of SARS-CoV-2 by FDA under an Emergency Use Authorization (EUA). This EUA will remain  in effect (meaning this test can be used) for the duration of the COVID-19 declaration under Section 56 4(b)(1) of the Act, 21 U.S.C. section 360bbb-3(b)(1), unless the authorization is terminated or revoked sooner. Performed at [facility], [place] [address] [place], [place] [address]      Studies: No results found.    Flora Lipps, MD  [facility] 05/10/2019

## 2019-05-10 NOTE — Progress Notes (Signed)
Patient discharged back to [facility] via ambulance, patient alert, denies any distress. No pressure injury noted. Report given to Amy-med tech at facility and discharge instructions given and explained to daughter at the bedside and she verbalized understanding.

## 2019-05-10 NOTE — Discharge Instructions (Signed)
Right periprosthetic femur fracture- May transfer OOB to chair with PT with TDWB RLE with brace on. OK for discharge back to her facility if she tolerates the limited mobility per Dr. Lequita Halt.

## 2019-05-10 NOTE — Discharge Summary (Signed)
Physician Discharge Summary  Yolanda Baker EXH:[id number] DOB: May 12, 1923 DOA: 05/06/2019  PCP: Yolanda Infante, MD  Admit date: 05/06/2019 Discharge date: 05/10/2019  Admitted From: Home  Discharge disposition: ALF   Recommendations for Outpatient Follow-Up:   . Follow up with your primary care provider in one week.  . Check CBC, BMP in the next visit . Follow-up with orthopedics Dr Gaynelle Arabian in 2 weeks for orthopedic follow up.   Marland Kitchen Touchdown weightbearing with the right lower extremity on a brace.   Discharge Diagnosis:   Principal Problem:   Periprosthetic fracture around internal prosthetic hip joint Active Problems:   HTN (hypertension)   Atrial flutter (HCC)   Dementia (HCC)   Pressure injury of skin   Goals of care, counseling/discussion   Palliative care by specialist   Discharge Condition: Improved.  Diet recommendation: Low sodium, heart healthy.    Wound care: None.  Code status: DNR   History of Present Illness:   Yolanda Baker a 86 y.o.femalehas medical history of anxiety, CHF, arthritis, depression, atrial flutter on anticoagulation, hypertension, dementia presented to hospital with complains of fall. Patient was at [facility]assisted living[facility]ility  when she fell out of bed. Thefall was unwitnessed but staff found her on the floor and put her back on bed. Patient did not complain of anythingat that timebut in the morning,she started having some right hip pain and had difficulty weightbearing.At baseline,patient can go to the bathroom with assistance but needs help with most of her ADLs. Patient's daughter at bedside reported that over the last month or so her weakness has progressed. ED Course:In the ED, patient patient was noted to have a right periprosthetic fracture.Patient was not in distress. Orthopedics was consulted and the plan was admissionto the hospital.  Hospital Course:   Following conditions were addressed during  hospitalization as listed below,  Right periprosthetic fracture of the hip.  Seen by orthopedics and recommended conservative treatment with brace and ambulation with physical therapy.  Patient was seen by physical therapy and recommended skilled nursing facility for rehabilitation.Touchdown weightbearing with the right lower extremity on a brace.  History of congestive heart failure.Compensated. On Lasix alternate day.  2D echocardiogram from 02/03/2019 showed ejection fraction of 50 to 55%.  Essential hypertension.On amlodipine.   Blood pressure is controlled.  History of atrialfibrillation. on metoprolol, apixaban.  Continue metoprolol p.o. twice daily.  Currently heart rate is controlled.  Dementia. On Celexa,Geodon, Ativan, galantamine  Stage I pressure ulceration present on admission.  Continue prevention protocol.  Disposition.  At this time, patient is stable for disposition to assisted living facility.  Patient will have to follow-up with primary care provider and orthopedics as outpatient  Medical Consultants:    Orthopedics  Palliative careOrthopedics  Procedures:     Brace Subjective:   Today, patient was seen and examined at bedside.  Patient feels okay.  Patient denies any shortness of breath, cough, fever or overt pain.  Discharge Exam:   Vitals:   05/09/19 1344 05/10/19 0953  BP: 119/71 126/86  Pulse: 97 81  Resp: 14   Temp: 99.2 F (37.3 C)   SpO2: 100%    Vitals:   05/09/19 0825 05/09/19 0901 05/09/19 1344 05/10/19 0953  BP: 129/77  119/71 126/86  Pulse: (!) 111 (!) 111 97 81  Resp: 20  14   Temp: 99.4 F (37.4 C)  99.2 F (37.3 C)   TempSrc: Oral  Oral   SpO2: 97%  100%   Weight:  Height:        General: Thin and frail, not in obvious distress HENT: Normocephalic, pupils equally reacting to light and accommodation.  No scleral pallor or icterus noted. Oral mucosa is moist.  Chest:  Clear breath sounds.  Diminished  breath sounds bilaterally. No crackles or wheezes.  CVS: S1 &S2 heard. No murmur.  Regular rate and rhythm. Abdomen: Soft, nontender, nondistended.  Bowel sounds are heard. Extremities: No cyanosis, clubbing or edema.  Peripheral pulses are palpable.  Right lower extremity on a brace.  Hip tenderness on palpation  Psych: Alert, awake and oriented, normal mood CNS:  No cranial nerve deficits.  Power equal in all extremities.  No sensory deficits noted.  No cerebellar signs.   Skin: Warm and dry.  No rashes noted.  The results of significant diagnostics from this hospitalization (including imaging, microbiology, ancillary and laboratory) are listed below for reference.     Diagnostic Studies:   CT Head Wo Contrast  Result Date: 05/06/2019 CLINICAL DATA:  Fall on anticoagulation EXAM: CT HEAD WITHOUT CONTRAST TECHNIQUE: Contiguous axial images were obtained from the base of the skull through the vertex without intravenous contrast. COMPARISON:  06/29/2014 FINDINGS: Brain: No evidence of acute infarction, hemorrhage, hydrocephalus, extra-axial collection or mass lesion/mass effect. Chronic left basal ganglia lacunar infarcts. Extensive low-density changes within the periventricular and subcortical white matter compatible with chronic microvascular ischemic change. Moderate diffuse cerebral volume loss. Vascular: Atherosclerotic calcifications involving the large vessels of the skull base. No unexpected hyperdense vessel. Skull: Normal. Negative for fracture or focal lesion. Sinuses/Orbits: No acute finding. Other: None. IMPRESSION: 1.  No acute intracranial findings. 2.  Chronic microvascular ischemic change and cerebral volume loss. Electronically Signed   By: Duanne Guess D.O.   On: 05/06/2019 16:36   DG Hip Unilat  With Pelvis 2-3 Views Right  Result Date: 05/06/2019 CLINICAL DATA:  Post fall. Right hip pain with weight-bearing and movement. Slip and fall 2 nights ago getting out of bed. EXAM:  DG HIP (WITH OR WITHOUT PELVIS) 2-3V RIGHT COMPARISON:  Pelvis and left hip radiograph 03/02/2015 FINDINGS: Right hip arthroplasty in expected alignment. Acute periprosthetic fracture about the mid distal femoral shaft displacement of approximately 6 mm of the medial cortex. Periprosthetic lucency about the femoral stem is again seen. 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 There is heterotopic ossification about the lateral aspect of the right hip. Left hemiarthroplasty appears intact. The previous left greater trochanteric fracture has healed. Pubic rami appear intact. IMPRESSION: 1. Right hip arthroplasty. Acute displaced periprosthetic fracture about the mid to distal femoral stem. 2. Chronic right acetabular protrusio. Chronic periprosthetic lucency about the femoral stem is again seen, suggesting loosening. 3. Left hip arthroplasty appears intact. Electronically Signed   By: Narda Rutherford M.D.   On: 05/06/2019 14:36     Labs:   Basic Metabolic Panel: Recent Labs  Lab 05/06/19 1526 05/07/19 0458  NA 136 136  K 3.9 3.7  CL 102 102  CO2 27 25  GLUCOSE 121* 99  BUN 24* 25*  CREATININE 0.80 0.84  CALCIUM 8.2* 7.9*   GFR Estimated Creatinine Clearance: 30.1 mL/min (by C-G formula based on SCr of 0.84 mg/dL). Liver Function Tests: No results for input(s): AST, ALT, ALKPHOS, BILITOT, PROT, ALBUMIN in the last 168 hours. No results for input(s): LIPASE, AMYLASE in the last 168 hours. No results for input(s): AMMONIA in the last 168 hours. Coagulation profile No results for input(s): INR, PROTIME in the last 168  hours.  CBC: Recent Labs  Lab 05/06/19 1526 05/07/19 0458 05/08/19 0441  WBC 12.4* 9.6 10.8*  NEUTROABS 9.2*  --   --   HGB 10.2* 9.1* 9.6*  HCT 32.3* 29.3* 30.1*  MCV 107.3* 108.5* 107.9*  PLT 224 206 226   Cardiac Enzymes: No results for input(s): CKTOTAL, CKMB, CKMBINDEX, TROPONINI in the last 168 hours. BNP: Invalid input(s): POCBNP CBG: No results  for input(s): GLUCAP in the last 168 hours. D-Dimer No results for input(s): DDIMER in the last 72 hours. Hgb A1c No results for input(s): HGBA1C in the last 72 hours. Lipid Profile No results for input(s): CHOL, HDL, LDLCALC, TRIG, CHOLHDL, LDLDIRECT in the last 72 hours. Thyroid function studies No results for input(s): TSH, T4TOTAL, T3FREE, THYROIDAB in the last 72 hours.  Invalid input(s): FREET3 Anemia work up No results for input(s): VITAMINB12, FOLATE, FERRITIN, TIBC, IRON, RETICCTPCT in the last 72 hours. Microbiology Recent Results (from the past 240 hour(s))  SARS CORONAVIRUS 2 (TAT 6-24 HRS) Nasopharyngeal Nasopharyngeal Swab     Status: None   Collection Time: 05/06/19  3:26 PM   Specimen: Nasopharyngeal Swab  Result Value Ref Range Status   SARS Coronavirus 2 NEGATIVE NEGATIVE Final    Comment: (NOTE) SARS-CoV-2 target nucleic acids are NOT DETECTED. The SARS-CoV-2 RNA is generally detectable in upper and lower respiratory specimens during the acute phase of infection. Negative results do not preclude SARS-CoV-2 infection, do not rule out co-infections with other pathogens, and should not be used as the sole basis for treatment or other patient management decisions. Negative results must be combined with clinical observations, patient history, and epidemiological information. The expected result is Negative. Fact Sheet for Patients: HairSlick.no Fact Sheet for Healthcare Providers: quierodirigir.com This test is not yet approved or cleared by the [place] FDA and  has been authorized for detection and/or diagnosis of SARS-CoV-2 by FDA under an Emergency Use Authorization (EUA). This EUA will remain  in effect (meaning this test can be used) for the duration of the COVID-19 declaration under Section 56 4(b)(1) of the Act, 21 U.S.C. section 360bbb-3(b)(1), unless the authorization is terminated or revoked  sooner. Performed at [facility], [address], [place], [place] [address]      Discharge Instructions:   Discharge Instructions    Diet - low sodium heart healthy   Complete by: As directed    Discharge instructions   Complete by: As directed    Follow-up with primary care physician in 1 week.  Follow-up with your orthopedic in 2 weeks.  Continue to wear brace.   Increase activity slowly   Complete by: As directed      Allergies as of 05/10/2019      Reactions   Flagyl [metronidazole]    Fingers get "numb"      Medication List    TAKE these medications   acetaminophen 500 MG tablet Commonly known as: TYLENOL Take 1 tablet (500 mg total) by mouth 2 (two) times daily as needed for mild pain or moderate pain. What changed: when to take this   amLODipine 5 MG tablet Commonly known as: NORVASC Take 5 mg by mouth daily.   apixaban 2.5 MG Tabs tablet Commonly known as: ELIQUIS Take 1 tablet (2.5 mg total) by mouth 2 (two) times daily.   CERTAVITE SENIOR/ANTIOXIDANT PO Take 1 tablet by mouth daily.   chlorhexidine 0.12 % solution Commonly known as: PERIDEX Use as directed 15 mLs in the mouth or throat  2 (two) times daily. Swish and spit   citalopram 10 MG tablet Commonly known as: CELEXA Take 30 mg by mouth daily.   CORICIDIN 2-325 MG Tabs Generic drug: Chlorpheniramine-APAP Take 1 tablet by mouth 2 (two) times daily as needed (congestion).   furosemide 20 MG tablet Commonly known as: LASIX Take 1 tablet (20 mg total) by mouth every Monday, Wednesday, and Friday. What changed: when to take this   galantamine 8 MG 24 hr capsule Commonly known as: RAZADYNE ER Take 8 mg by mouth daily with breakfast.   HYDROcodone-acetaminophen 5-325 MG tablet Commonly known as: NORCO/VICODIN Take 1-2 tablets by mouth every 6 (six) hours as needed for moderate pain.   LORazepam 0.5 MG tablet Commonly known as: ATIVAN Take 1 tablet (0.5 mg total) by mouth 2 (two)  times daily.   methocarbamol 500 MG tablet Commonly known as: ROBAXIN Take 1 tablet (500 mg total) by mouth every 6 (six) hours as needed for muscle spasms.   metoprolol tartrate 100 MG tablet Commonly known as: LOPRESSOR Take 100 mg by mouth 2 (two) times daily.   potassium chloride SA 20 MEQ tablet Commonly known as: KLOR-CON Take 20 mEq by mouth every other day.   psyllium 0.52 g capsule Commonly known as: REGULOID Take 0.52 g by mouth daily.   saline Gel Place 1 application into both nostrils at bedtime.   sodium chloride 0.65 % Soln nasal spray Commonly known as: OCEAN Place 3 sprays into both nostrils 4 (four) times daily.   simvastatin 10 MG tablet Commonly known as: ZOCOR Take 1 tablet (10 mg total) by mouth daily at 6 PM.   Stiolto Respimat 2.5-2.5 MCG/ACT Aers Generic drug: Tiotropium Bromide-Olodaterol Inhale 2 puffs into the lungs daily.   Systane 0.4-0.3 % Soln Generic drug: Polyethyl Glycol-Propyl Glycol Place 1 drop into both eyes every hour as needed (dry eyes/irritation).   ziprasidone 60 MG capsule Commonly known as: GEODON Take 60 mg by mouth daily.      Follow-up Information    primary care physician. Schedule an appointment as soon as possible for a visit.        Ollen Gross, MD. Schedule an appointment as soon as possible for a visit in 2 weeks.   Specialty: Orthopedic Surgery Why: Call [phone] to make the appointment Contact information: [address] [place] [place] [address] [phone]            Time coordinating discharge: 39 minutes  Signed:  Toma Erichsen  [facility] 05/10/2019, 2:31 PM

## 2019-05-10 NOTE — TOC Progression Note (Signed)
Transition of Care [facility]) - Progression Note    Patient Details  Name: Yolanda Baker MRN: [id number] Date of Birth: [birth year]  [facility] CM/SW Contact  Marina Goodell Phone Number: [phone] 05/10/2019, 12:52 PM  Clinical Narrative:    Sherron Monday with Misty Stanley at [facility] she stated the patient's daughter told her the patient was returning to the facility on 05/11/2019. The patient has her bed ready for her.  This SW contacted [facility] Coodinator  At [facility], and she stated, I needed to contact the Med Tech for the patient's floor at [phone], and fax the [place], discharge summary and report to [phone] to her.  This SW contact the Med Tech and left a message to confirm patient transportation to [facility] today; waiting for return call.   Expected Discharge Plan: Assisted Living Barriers to Discharge: Continued Medical Work up  Expected Discharge Plan and Services Expected Discharge Plan: Assisted Living   Discharge Planning Services: CM Consult   Living arrangements for the past 2 months: Assisted Living Facility                                       Social Determinants of Health (SDOH) Interventions    Readmission Risk Interventions Readmission Risk Prevention Plan 05/07/2019 02/04/2019  Transportation Screening Complete Complete  PCP or Specialist Appt within 5-7 Days - Complete  PCP or Specialist Appt within 3-5 Days Complete -  Home Care Screening - Complete  Medication Review (RN CM) - Complete  HRI or Home Care Consult Complete -  Social Work Consult for Recovery Care Planning/Counseling Complete -  Palliative Care Screening Complete -  Medication Review Oceanographer) Complete -  Some recent data might be hidden

## 2019-05-10 NOTE — Progress Notes (Signed)
Civil engineer, contracting Documentation  Liaison received referral for pt to dc to [facility] with hospice. Spoke with daughter, [name], this morning who stated that she wishes for pt to dc to [facility] to have outpt therapy. [facility] utilizes [facility]. TOC and SW notified by writer that [facility] will follow pt.   Please reach out with any questions and thank you for the referral.   Trena Platt, RN [facility] Liaison [phone]

## 2019-05-11 ENCOUNTER — Telehealth: Payer: Self-pay

## 2019-05-11 NOTE — Telephone Encounter (Signed)
Phone call placed to PCP office. Message left for medical assistant, Odette Horns, to confirm that Dr. Waynard Edwards is in agreement with Palliative care for patient.

## 2019-05-12 DIAGNOSIS — E785 Hyperlipidemia, unspecified: Secondary | ICD-10-CM | POA: Diagnosis not present

## 2019-05-12 DIAGNOSIS — I509 Heart failure, unspecified: Secondary | ICD-10-CM | POA: Diagnosis not present

## 2019-05-12 DIAGNOSIS — I4891 Unspecified atrial fibrillation: Secondary | ICD-10-CM | POA: Diagnosis not present

## 2019-05-12 DIAGNOSIS — F0390 Unspecified dementia without behavioral disturbance: Secondary | ICD-10-CM | POA: Diagnosis not present

## 2019-05-12 DIAGNOSIS — I110 Hypertensive heart disease with heart failure: Secondary | ICD-10-CM | POA: Diagnosis not present

## 2019-05-12 DIAGNOSIS — I4892 Unspecified atrial flutter: Secondary | ICD-10-CM | POA: Diagnosis not present

## 2019-05-12 DIAGNOSIS — M810 Age-related osteoporosis without current pathological fracture: Secondary | ICD-10-CM | POA: Diagnosis not present

## 2019-05-12 DIAGNOSIS — M47812 Spondylosis without myelopathy or radiculopathy, cervical region: Secondary | ICD-10-CM | POA: Diagnosis not present

## 2019-05-12 DIAGNOSIS — M9701XD Periprosthetic fracture around internal prosthetic right hip joint, subsequent encounter: Secondary | ICD-10-CM | POA: Diagnosis not present

## 2019-05-13 ENCOUNTER — Other Ambulatory Visit: Payer: Self-pay

## 2019-05-13 ENCOUNTER — Emergency Department (HOSPITAL_COMMUNITY): Payer: Medicare PPO

## 2019-05-13 ENCOUNTER — Encounter (HOSPITAL_COMMUNITY): Payer: Self-pay

## 2019-05-13 ENCOUNTER — Emergency Department (HOSPITAL_COMMUNITY)
Admission: EM | Admit: 2019-05-13 | Discharge: 2019-05-14 | Disposition: A | Discharge status: Skilled Nursing Facility | Payer: Medicare PPO | Attending: Emergency Medicine | Admitting: Emergency Medicine

## 2019-05-13 DIAGNOSIS — M79651 Pain in right thigh: Secondary | ICD-10-CM | POA: Diagnosis not present

## 2019-05-13 DIAGNOSIS — R05 Cough: Secondary | ICD-10-CM | POA: Diagnosis not present

## 2019-05-13 DIAGNOSIS — Z79899 Other long term (current) drug therapy: Secondary | ICD-10-CM | POA: Diagnosis not present

## 2019-05-13 DIAGNOSIS — R062 Wheezing: Secondary | ICD-10-CM | POA: Diagnosis not present

## 2019-05-13 DIAGNOSIS — I4892 Unspecified atrial flutter: Secondary | ICD-10-CM | POA: Insufficient documentation

## 2019-05-13 DIAGNOSIS — Z7901 Long term (current) use of anticoagulants: Secondary | ICD-10-CM | POA: Insufficient documentation

## 2019-05-13 DIAGNOSIS — R0602 Shortness of breath: Secondary | ICD-10-CM | POA: Diagnosis not present

## 2019-05-13 DIAGNOSIS — Z743 Need for continuous supervision: Secondary | ICD-10-CM | POA: Diagnosis not present

## 2019-05-13 DIAGNOSIS — Z20822 Contact with and (suspected) exposure to covid-19: Secondary | ICD-10-CM | POA: Insufficient documentation

## 2019-05-13 DIAGNOSIS — L89321 Pressure ulcer of left buttock, stage 1: Secondary | ICD-10-CM | POA: Diagnosis not present

## 2019-05-13 DIAGNOSIS — L89311 Pressure ulcer of right buttock, stage 1: Secondary | ICD-10-CM | POA: Diagnosis not present

## 2019-05-13 DIAGNOSIS — M25551 Pain in right hip: Secondary | ICD-10-CM | POA: Diagnosis not present

## 2019-05-13 DIAGNOSIS — Z03818 Encounter for observation for suspected exposure to other biological agents ruled out: Secondary | ICD-10-CM | POA: Diagnosis not present

## 2019-05-13 DIAGNOSIS — R0902 Hypoxemia: Secondary | ICD-10-CM | POA: Diagnosis not present

## 2019-05-13 DIAGNOSIS — I10 Essential (primary) hypertension: Secondary | ICD-10-CM | POA: Diagnosis not present

## 2019-05-13 DIAGNOSIS — S7291XA Unspecified fracture of right femur, initial encounter for closed fracture: Secondary | ICD-10-CM | POA: Diagnosis not present

## 2019-05-13 DIAGNOSIS — S72001D Fracture of unspecified part of neck of right femur, subsequent encounter for closed fracture with routine healing: Secondary | ICD-10-CM | POA: Diagnosis not present

## 2019-05-13 DIAGNOSIS — R32 Unspecified urinary incontinence: Secondary | ICD-10-CM | POA: Diagnosis not present

## 2019-05-13 DIAGNOSIS — J189 Pneumonia, unspecified organism: Secondary | ICD-10-CM | POA: Diagnosis not present

## 2019-05-13 NOTE — ED Provider Notes (Signed)
[place] DEPT Provider Note: Georgena Spurling, MD, FACEP  CSN: [id number] MRN: [id number] ARRIVAL: 05/13/19 at 2318 ROOM: WA09/WA09   CHIEF COMPLAINT  Shortness of Breath  Level 5 caveat: Dementia HISTORY OF PRESENT ILLNESS  05/13/19 11:40 PM Yolanda Baker is a 86 y.o. female who was admitted to the hospital on 05/06/2019 for a right periprosthetic femur fracture.  This was deemed by orthopedics to be nonoperative.  She was placed in a support device with plans for touchdown weightbearing only.  She was brought by EMS this evening for shortness of breath and reported pneumonia.  It is unclear how the pneumonia was diagnosed.  EMS found her oxygen saturation to be 84% on room air and this improved with supplemental oxygen.  EMS reports hearing some expiratory stridor.  She was given 125 mg of Solu-Medrol IV and 2 g of magnesium sulfate IV.  She was not given any albuterol.  She is on Stiolto Respimat inhaler twice daily.  The patient has advanced dementia and is unable to give any significant history.   Past Medical History:  Diagnosis Date  . Anxiety   . Arthritis    neck  . Bruises easily   . Depression   . Dysrhythmia    hx A-FLUTTER after hip surg April 2015- take metoprolol  . Eczema   . Hip joint pain    RT  . Hypertension   . Memory loss   . Osteoporosis   . Pneumonia    august 2017  . Shortness of breath    with activity  . Stroke [facility])    "mini stroke" per MRI - no deficiets     Past Surgical History:  Procedure Laterality Date  . ABDOMINAL HYSTERECTOMY    . FRACTURE SURGERY Right 06/05/2013   hip  . HIP ARTHROPLASTY Right 10/03/2013   Procedure: CONVERSION OF PREVIOUS RIGHT HIP SURGERY TO A RIGHT TOTAL HIP ARTHROPLASTY;  Surgeon: Gearlean Alf, MD;  Location: [facility];  Service: Orthopedics;  Laterality: Right;  . INTRAMEDULLARY (IM) NAIL INTERTROCHANTERIC Right 06/05/2013   Procedure: INTRAMEDULLARY (IM) NAIL INTERTROCHANTRIC;  Surgeon: Gearlean Alf, MD;   Location: [facility];  Service: Orthopedics;  Laterality: Right;  . JOINT REPLACEMENT     left hip  . left hip Left 2008  . LEG SURGERY Right    x2    Family History  Problem Relation Age of Onset  . Heart attack Father   . Hypertension Father   . Hypertension Sister   . Stroke Sister   . Hypertension Brother     Social History   Tobacco Use  . Smoking status: Never Smoker  . Smokeless tobacco: Never Used  Substance Use Topics  . Alcohol use: No    Alcohol/week: 0.0 standard drinks  . Drug use: No    Prior to Admission medications   Medication Sig Start Date End Date Taking? Authorizing Provider  acetaminophen (TYLENOL) 500 MG tablet Take 1 tablet (500 mg total) by mouth 2 (two) times daily as needed for mild pain or moderate pain. Patient taking differently: Take 500 mg by mouth 2 (two) times daily.  03/02/15  Yes Harvel Quale, MD  amLODipine (NORVASC) 5 MG tablet Take 5 mg by mouth daily.   Yes [provider]  apixaban (ELIQUIS) 2.5 MG TABS tablet Take 1 tablet (2.5 mg total) by mouth 2 (two) times daily. 07/01/14  Yes Donzetta Starch, NP  chlorhexidine (PERIDEX) 0.12 % solution Use as directed 15  mLs in the mouth or throat 2 (two) times daily. Swish and spit   Yes [provider]  Chlorpheniramine-APAP (CORICIDIN) 2-325 MG TABS Take 1 tablet by mouth 2 (two) times daily as needed (congestion).   Yes [provider]  citalopram (CELEXA) 10 MG tablet Take 30 mg by mouth daily.  01/03/15  Yes [provider]  furosemide (LASIX) 20 MG tablet Take 1 tablet (20 mg total) by mouth every Monday, Wednesday, and Friday. 02/09/19  Yes Albertine Grates, MD  galantamine (RAZADYNE ER) 8 MG 24 hr capsule Take 8 mg by mouth daily with breakfast.   Yes [provider]  HYDROcodone-acetaminophen (NORCO/VICODIN) 5-325 MG tablet Take 1-2 tablets by mouth every 6 (six) hours as needed for moderate pain. 05/10/19  Yes Pokhrel, Laxman, MD  LORazepam (ATIVAN) 0.5 MG  tablet Take 1 tablet (0.5 mg total) by mouth 2 (two) times daily. 02/04/19  Yes Swayze, Ava, DO  methocarbamol (ROBAXIN) 500 MG tablet Take 1 tablet (500 mg total) by mouth every 6 (six) hours as needed for muscle spasms. 05/10/19  Yes Pokhrel, Laxman, MD  metoprolol tartrate (LOPRESSOR) 100 MG tablet Take 100 mg by mouth 2 (two) times daily. 05/04/19  Yes [provider]  Multiple Vitamins-Minerals (CERTAVITE SENIOR/ANTIOXIDANT PO) Take 1 tablet by mouth daily.    Yes [provider]  Polyethyl Glycol-Propyl Glycol (SYSTANE) 0.4-0.3 % SOLN Place 1 drop into both eyes every hour as needed (dry eyes/irritation).   Yes [provider]  potassium chloride SA (K-DUR,KLOR-CON) 20 MEQ tablet Take 20 mEq by mouth every other day.   Yes [provider]  psyllium (REGULOID) 0.52 g capsule Take 0.52 g by mouth daily.   Yes [provider]  saline (AYR) GEL Place 1 application into both nostrils at bedtime.   Yes [provider]  simvastatin (ZOCOR) 10 MG tablet Take 1 tablet (10 mg total) by mouth daily at 6 PM. 07/01/14  Yes Biby, Jani Files, NP  sodium chloride (OCEAN) 0.65 % SOLN nasal spray Place 3 sprays into both nostrils 4 (four) times daily.   Yes [provider]  STIOLTO RESPIMAT 2.5-2.5 MCG/ACT AERS Inhale 2 puffs into the lungs daily. 04/17/19  Yes [provider]  ziprasidone (GEODON) 60 MG capsule Take 60 mg by mouth daily.  06/08/15  Yes [provider]  ipratropium-albuterol (DUONEB) 0.5-2.5 (3) MG/3ML SOLN Administer by nebulizer every 6 hours as needed for shortness of breath. 05/14/19   Evelin Cake, MD    Allergies Flagyl [metronidazole]   REVIEW OF SYSTEMS     PHYSICAL EXAMINATION  Initial Vital Signs Blood pressure (!) 150/59, pulse 62, resp. rate 20, SpO2 100 %.  Examination General: Well-developed, well-nourished female in no acute distress; appearance consistent with age of record HENT: normocephalic;  atraumatic Eyes: pupils equal, round and reactive to light; extraocular muscles grossly intact Neck: supple Heart: regular rate and rhythm Lungs: Faint expiratory stridor Abdomen: soft; nondistended; nontender; bowel sounds present Extremities: No acute deformity; right hip and waist bound in orthotic device; pulses +1; trace edema of lower legs Neurologic: Awake, alert; minimally verbal; noted to move all extremities; no facial droop Skin: Warm and dry Psychiatric: Normal mood and affect   RESULTS  Summary of this visit's results, reviewed and interpreted by myself:   EKG Interpretation  Date/Time:  Thursday May 14 2019 00:36:29 EDT Ventricular Rate:  60 PR Interval:    QRS Duration: 78 QT Interval:  447 QTC Calculation: 447 R Axis:  68 Text Interpretation: Sinus rhythm Borderline prolonged PR interval Anterior infarct, old No significant change was found Confirmed by Paula Libra ([id number]) on 05/14/2019 1:01:25 AM      Laboratory Studies: Results for orders placed or performed during the hospital encounter of 05/13/19 (from the past 24 hour(s))  Urinalysis, Routine w reflex microscopic     Status: None   Collection Time: 05/14/19 12:36 AM  Result Value Ref Range   Color, Urine YELLOW YELLOW   APPearance CLEAR CLEAR   Specific Gravity, Urine 1.015 1.005 - 1.030   pH 6.0 5.0 - 8.0   Glucose, UA NEGATIVE NEGATIVE mg/dL   Hgb urine dipstick NEGATIVE NEGATIVE   Bilirubin Urine NEGATIVE NEGATIVE   Ketones, ur NEGATIVE NEGATIVE mg/dL   Protein, ur NEGATIVE NEGATIVE mg/dL   Nitrite NEGATIVE NEGATIVE   Leukocytes,Ua NEGATIVE NEGATIVE  CBC with Differential/Platelet     Status: Abnormal   Collection Time: 05/14/19 12:50 AM  Result Value Ref Range   WBC 12.5 (H) 4.0 - 10.5 K/uL   RBC 3.02 (L) 3.87 - 5.11 MIL/uL   Hemoglobin 10.2 (L) 12.0 - 15.0 g/dL   HCT 55.7 (L) 32.2 - 02.5 %   MCV 107.9 (H) 80.0 - 100.0 fL   MCH 33.8 26.0 - 34.0 pg   MCHC 31.3 30.0 - 36.0 g/dL   RDW  42.7 06.2 - 37.6 %   Platelets 413 (H) 150 - 400 K/uL   nRBC 0.0 0.0 - 0.2 %   Neutrophils Relative % 81 %   Neutro Abs 10.2 (H) 1.7 - 7.7 K/uL   Lymphocytes Relative 9 %   Lymphs Abs 1.1 0.7 - 4.0 K/uL   Monocytes Relative 4 %   Monocytes Absolute 0.5 0.1 - 1.0 K/uL   Eosinophils Relative 3 %   Eosinophils Absolute 0.4 0.0 - 0.5 K/uL   Basophils Relative 1 %   Basophils Absolute 0.1 0.0 - 0.1 K/uL   Immature Granulocytes 2 %   Abs Immature Granulocytes 0.19 (H) 0.00 - 0.07 K/uL  Basic metabolic panel     Status: Abnormal   Collection Time: 05/14/19 12:50 AM  Result Value Ref Range   Sodium 136 135 - 145 mmol/L   Potassium 4.3 3.5 - 5.1 mmol/L   Chloride 101 98 - 111 mmol/L   CO2 27 22 - 32 mmol/L   Glucose, Bld 134 (H) 70 - 99 mg/dL   BUN 22 8 - 23 mg/dL   Creatinine, Ser 2.83 0.44 - 1.00 mg/dL   Calcium 8.3 (L) 8.9 - 10.3 mg/dL   GFR calc non Af Amer 58 (L) >60 mL/min   GFR calc Af Amer >60 >60 mL/min   Anion gap 8 5 - 15  Brain natriuretic peptide     Status: Abnormal   Collection Time: 05/14/19 12:50 AM  Result Value Ref Range   B Natriuretic Peptide 627.9 (H) 0.0 - 100.0 pg/mL  Respiratory Panel by RT PCR (Flu A&B, Covid) - Nasopharyngeal Swab     Status: None   Collection Time: 05/14/19  1:28 AM   Specimen: Nasopharyngeal Swab  Result Value Ref Range   SARS Coronavirus 2 by RT PCR NEGATIVE NEGATIVE   Influenza A by PCR NEGATIVE NEGATIVE   Influenza B by PCR NEGATIVE NEGATIVE   Imaging Studies: DG Chest 2 View  Result Date: 05/14/2019 CLINICAL DATA:  86 year old female with shortness of breath. EXAM: CHEST - 2 VIEW COMPARISON:  Chest radiograph dated 04/05/2019. FINDINGS: There is a small left pleural  effusion and left lung base atelectasis or infiltrate. Right suprahilar density consistent with an atelectasis or infiltrate in the right upper lobe. There is no pneumothorax. There is mild cardiomegaly. No acute osseous pathology. IMPRESSION: 1. Small left pleural  effusion and left lung base atelectasis versus infiltrate. 2. Right upper lobe atelectasis versus infiltrate. Electronically Signed   By: Elgie Collard M.D.   On: 05/14/2019 00:31    ED COURSE and MDM  Nursing notes, initial and subsequent vitals signs, including pulse oximetry, reviewed and interpreted by myself.  Vitals:   05/14/19 0050 05/14/19 0100 05/14/19 0130 05/14/19 0200  BP:  (!) 141/78 (!) 134/53 (!) 127/54  Pulse:   (!) 56 (!) 59  Resp:  (!) 22 17 15   Temp: 99.8 F (37.7 C)     TempSrc: Rectal     SpO2:   99% 97%   Medications  ipratropium-albuterol (DUONEB) 0.5-2.5 (3) MG/3ML nebulizer solution 3 mL (3 mLs Nebulization Given 05/14/19 0257)    3:53 AM Patient's oxygen saturation is 95% on room air after albuterol and Atrovent (DuoNeb) neb treatment.  The patient's presentation is not consistent with pneumonia and she has shown no objective signs of pneumonia (afebrile, no leukocytosis, equivocal chest x-ray) in the department.  We will recommend DuoNeb treatments as needed in addition to her current twice daily Stiolto treatments.  PROCEDURES  Procedures   ED DIAGNOSES     ICD-10-CM   1. Shortness of breath  R06.02        05/16/19, MD 05/14/19 [phone]

## 2019-05-13 NOTE — ED Triage Notes (Signed)
BIB EMS form [facility] [employer]. Pt presents with SHOB. Recently discharge from hospital due to broke hip. Hip brace in place. EMS reports facility stated Pt was diagnosised with pneumonia recently.  Initial  o2 stat 84% on RA with EMS. Audible wheezing EMS gave Mag and solumedrol. IV R AC.   88% on RA 98-100% on  2L at this time. Hx of dementia.

## 2019-05-14 ENCOUNTER — Emergency Department (HOSPITAL_COMMUNITY): Payer: Medicare PPO

## 2019-05-14 DIAGNOSIS — I10 Essential (primary) hypertension: Secondary | ICD-10-CM | POA: Diagnosis not present

## 2019-05-14 DIAGNOSIS — R0602 Shortness of breath: Secondary | ICD-10-CM | POA: Diagnosis not present

## 2019-05-14 DIAGNOSIS — Z7401 Bed confinement status: Secondary | ICD-10-CM | POA: Diagnosis not present

## 2019-05-14 DIAGNOSIS — M2550 Pain in unspecified joint: Secondary | ICD-10-CM | POA: Diagnosis not present

## 2019-05-14 DIAGNOSIS — Z20822 Contact with and (suspected) exposure to covid-19: Secondary | ICD-10-CM | POA: Diagnosis not present

## 2019-05-14 LAB — URINALYSIS, ROUTINE W REFLEX MICROSCOPIC
Bilirubin Urine: NEGATIVE
Glucose, UA: NEGATIVE mg/dL
Hgb urine dipstick: NEGATIVE
Ketones, ur: NEGATIVE mg/dL
Leukocytes,Ua: NEGATIVE
Nitrite: NEGATIVE
Protein, ur: NEGATIVE mg/dL
Specific Gravity, Urine: 1.015 (ref 1.005–1.030)
pH: 6 (ref 5.0–8.0)

## 2019-05-14 LAB — CBC WITH DIFFERENTIAL/PLATELET
Abs Immature Granulocytes: 0.19 10*3/uL — ABNORMAL HIGH (ref 0.00–0.07)
Basophils Absolute: 0.1 10*3/uL (ref 0.0–0.1)
Basophils Relative: 1 %
Eosinophils Absolute: 0.4 10*3/uL (ref 0.0–0.5)
Eosinophils Relative: 3 %
HCT: 32.6 % — ABNORMAL LOW (ref 36.0–46.0)
Hemoglobin: 10.2 g/dL — ABNORMAL LOW (ref 12.0–15.0)
Immature Granulocytes: 2 %
Lymphocytes Relative: 9 %
Lymphs Abs: 1.1 10*3/uL (ref 0.7–4.0)
MCH: 33.8 pg (ref 26.0–34.0)
MCHC: 31.3 g/dL (ref 30.0–36.0)
MCV: 107.9 fL — ABNORMAL HIGH (ref 80.0–100.0)
Monocytes Absolute: 0.5 10*3/uL (ref 0.1–1.0)
Monocytes Relative: 4 %
Neutro Abs: 10.2 10*3/uL — ABNORMAL HIGH (ref 1.7–7.7)
Neutrophils Relative %: 81 %
Platelets: 413 10*3/uL — ABNORMAL HIGH (ref 150–400)
RBC: 3.02 MIL/uL — ABNORMAL LOW (ref 3.87–5.11)
RDW: 14.6 % (ref 11.5–15.5)
WBC: 12.5 10*3/uL — ABNORMAL HIGH (ref 4.0–10.5)
nRBC: 0 % (ref 0.0–0.2)

## 2019-05-14 LAB — BASIC METABOLIC PANEL
Anion gap: 8 (ref 5–15)
BUN: 22 mg/dL (ref 8–23)
CO2: 27 mmol/L (ref 22–32)
Calcium: 8.3 mg/dL — ABNORMAL LOW (ref 8.9–10.3)
Chloride: 101 mmol/L (ref 98–111)
Creatinine, Ser: 0.86 mg/dL (ref 0.44–1.00)
GFR calc Af Amer: >60 mL/min (ref >60)
GFR calc non Af Amer: 58 mL/min — ABNORMAL LOW (ref >60)
Glucose, Bld: 134 mg/dL — ABNORMAL HIGH (ref 70–99)
Potassium: 4.3 mmol/L (ref 3.5–5.1)
Sodium: 136 mmol/L (ref 135–145)

## 2019-05-14 LAB — BRAIN NATRIURETIC PEPTIDE: B Natriuretic Peptide: 627.9 pg/mL — ABNORMAL HIGH (ref 0.0–100.0)

## 2019-05-14 LAB — RESPIRATORY PANEL BY RT PCR (FLU A&B, COVID)
Influenza A by PCR: NEGATIVE
Influenza B by PCR: NEGATIVE
SARS Coronavirus 2 by RT PCR: NEGATIVE

## 2019-05-14 MED ORDER — IPRATROPIUM-ALBUTEROL 0.5-2.5 (3) MG/3ML IN SOLN: RESPIRATORY_TRACT | 0 refills | Status: DC

## 2019-05-14 MED ORDER — IPRATROPIUM-ALBUTEROL 0.5-2.5 (3) MG/3ML IN SOLN
3.0000 mL | RESPIRATORY_TRACT | Status: DC
  Administered 2019-05-14: 3 mL via RESPIRATORY_TRACT
  Filled 2019-05-14: qty 3

## 2019-05-14 NOTE — ED Notes (Signed)
Pt brief changed and In/out changed. Pt placed on purewick.

## 2019-05-14 NOTE — ED Notes (Signed)
Pt o2 stats remain grater than 95% on RA after neb treatment.

## 2019-05-14 NOTE — ED Notes (Signed)
[facility] called  

## 2019-05-15 DIAGNOSIS — Z79899 Other long term (current) drug therapy: Secondary | ICD-10-CM | POA: Diagnosis not present

## 2019-05-15 DIAGNOSIS — Z20828 Contact with and (suspected) exposure to other viral communicable diseases: Secondary | ICD-10-CM | POA: Diagnosis not present

## 2019-05-18 DIAGNOSIS — M47812 Spondylosis without myelopathy or radiculopathy, cervical region: Secondary | ICD-10-CM | POA: Diagnosis not present

## 2019-05-18 DIAGNOSIS — I4892 Unspecified atrial flutter: Secondary | ICD-10-CM | POA: Diagnosis not present

## 2019-05-18 DIAGNOSIS — M810 Age-related osteoporosis without current pathological fracture: Secondary | ICD-10-CM | POA: Diagnosis not present

## 2019-05-18 DIAGNOSIS — I110 Hypertensive heart disease with heart failure: Secondary | ICD-10-CM | POA: Diagnosis not present

## 2019-05-18 DIAGNOSIS — I4891 Unspecified atrial fibrillation: Secondary | ICD-10-CM | POA: Diagnosis not present

## 2019-05-18 DIAGNOSIS — M9701XD Periprosthetic fracture around internal prosthetic right hip joint, subsequent encounter: Secondary | ICD-10-CM | POA: Diagnosis not present

## 2019-05-18 DIAGNOSIS — E785 Hyperlipidemia, unspecified: Secondary | ICD-10-CM | POA: Diagnosis not present

## 2019-05-18 DIAGNOSIS — I509 Heart failure, unspecified: Secondary | ICD-10-CM | POA: Diagnosis not present

## 2019-05-18 DIAGNOSIS — F0390 Unspecified dementia without behavioral disturbance: Secondary | ICD-10-CM | POA: Diagnosis not present

## 2019-05-19 DIAGNOSIS — I4891 Unspecified atrial fibrillation: Secondary | ICD-10-CM | POA: Diagnosis not present

## 2019-05-19 DIAGNOSIS — I110 Hypertensive heart disease with heart failure: Secondary | ICD-10-CM | POA: Diagnosis not present

## 2019-05-19 DIAGNOSIS — M810 Age-related osteoporosis without current pathological fracture: Secondary | ICD-10-CM | POA: Diagnosis not present

## 2019-05-19 DIAGNOSIS — E785 Hyperlipidemia, unspecified: Secondary | ICD-10-CM | POA: Diagnosis not present

## 2019-05-19 DIAGNOSIS — F0390 Unspecified dementia without behavioral disturbance: Secondary | ICD-10-CM | POA: Diagnosis not present

## 2019-05-19 DIAGNOSIS — M47812 Spondylosis without myelopathy or radiculopathy, cervical region: Secondary | ICD-10-CM | POA: Diagnosis not present

## 2019-05-19 DIAGNOSIS — I4892 Unspecified atrial flutter: Secondary | ICD-10-CM | POA: Diagnosis not present

## 2019-05-19 DIAGNOSIS — I509 Heart failure, unspecified: Secondary | ICD-10-CM | POA: Diagnosis not present

## 2019-05-19 DIAGNOSIS — M9701XD Periprosthetic fracture around internal prosthetic right hip joint, subsequent encounter: Secondary | ICD-10-CM | POA: Diagnosis not present

## 2019-05-19 NOTE — Progress Notes (Signed)
Virtual Visit via Telephone Note   This visit type was conducted due to national recommendations for restrictions regarding the COVID-19 Pandemic (e.g. social distancing) in an effort to limit this patient's exposure and mitigate transmission in our community.  Due to her co-morbid illnesses, this patient is at least at moderate risk for complications without adequate follow up.  This format is felt to be most appropriate for this patient at this time.  The patient did not have access to video technology/had technical difficulties with video requiring transitioning to audio format only (telephone).  All issues noted in this document were discussed and addressed.  No physical exam could be performed with this format.  Please refer to the patient's chart for her  consent to telehealth for [facility].  Evaluation Performed:  Follow-up visit  This visit type was conducted due to national recommendations for restrictions regarding the COVID-19 Pandemic (e.g. social distancing).  This format is felt to be most appropriate for this patient at this time.  All issues noted in this document were discussed and addressed.  No physical exam was performed (except for noted visual exam findings with Video Visits).  Please refer to the patient's chart (MyChart message for video visits and phone note for telephone visits) for the patient's consent to telehealth for [facility]  Date:  05/21/2019   ID:  Yolanda Baker, DOB [birth year], MRN [id number]  Patient Location:  [address] [place] [address]   Provider location:      [facility] [address] Office [phone] Fax [phone]   PCP:  Yolanda Ran, MD  Cardiologist:  Yolanda Si, MD  Electrophysiologist:  None   Chief Complaint: Follow-up  History of Present Illness:    Yolanda Baker is a 86 y.o. female who presents via audio/video conferencing for a telehealth visit today.   Patient verified DOB and address.  The patient does not symptoms concerning for COVID-19 infection (fever, chills, cough, or new SHORTNESS OF BREATH).   [name] have a past medical history of atrial fibrillation, hypertension, CVA, and dementia.  She was recently admitted to the hospital on 02/02/2019 and discharged on 02/07/2019.  She is a resident of an assisted living facility and was noted to be short of breath after her evening meal.  She was brought to the emergency department and found to be in atrial fibrillation with rapid ventricular rate.  At that time her chest x-ray showed pleural effusion that was concerning for CHF.  Her BNP was 335 and a high-sensitivity troponin was 9.  Her creatinine was 1.8 with a hemoglobin of 12.9.  She showed significant improvement with 2 doses of IV metoprolol and 20 mg of IV Lasix.  Her COVID-19 test at that time was negative.  Her metoprolol tartrate 75 mg twice daily was continued at discharge and she was instructed to take furosemide 20 mg Monday Wednesday and Friday to be started on 02/09/2019.  She presented to the clinic 02/16/2019 accompanied by the facility CNA and her daughter on the phone.  She stated she is back to her normal daily activities.  She denied chest pain, shortness of breath, bleeding, and presyncope, syncope.  Her daughter asks about amyloidosis and we spent several minutes on the phone talking about the possibility of this diagnosis.  We also reviewed patient's echocardiogram and her diagnosis of atrial fibrillation.  At that point her daughter did not wish to do  further amyloid  testing.  I will checked a BMP  and planned follow-up in 3 months.  She was admitted to the hospital 05/06/2019-05/10/2019 with right periprosthetic fracture of the hip.  She had fallen out of bed at her nursing home.  Orthopedics was consulted and family decided for conservative treatment with physical therapy and bracing the hip.  She also presented to the  emergency department on 05/13/2019 with increased shortness of breath.  She was diagnosed with pneumonia and started on antibiotics, nebulizers, and received a chest x-ray.  She is seen virtually today for follow-up and states she is feeling much better.  Her daughter is on the phone with her as well and states she is seen a great improvement in her mom's breathing.  Today is her last day of antibiotics.  She states she has not gained any weight and has stayed at her normal 100-105 range.  She has continued physical therapy to rehab her right hip.  Her daughter Yolanda Baker was educated about diastolic heart failure and symptoms that we assess as related to her mom.  I will have her weigh daily and have [facility] call with a weight increase of 3 pounds over night or 5 pounds in a week.  I will have her follow-up with Dr. Duke Salvia in 3 months.  Today she denies chest pain, shortness of breath, lower extremity edema, fatigue, palpitations, melena, hematuria, hemoptysis, diaphoresis, weakness, presyncope, syncope, orthopnea, and PND.   Prior CV studies:   The following studies were reviewed today:  EKG 02/16/2019 atrial fibrillation with rapid ventricular response left axis deviation septal infarct undetermined age 61 bpm- No acute changes  EKG in 02/02/2019 Atrial fibrillation with PVCs 122 bpm  Echo 02/03/19: IMPRESSIONS  1. Left ventricular ejection fraction, by visual estimation, is 50 to 55%. The left ventricle has low normal function. There is moderately increased left ventricular hypertrophy. The LV morphology could be consistent with cardiac amyloidosis. 2. Left ventricular diastolic parameters are indeterminate. 3. The left ventricle has no regional wall motion abnormalities. 4. Global right ventricle has normal systolic function.The right ventricular size is normal. No increase in right ventricular wall thickness. 5. Left atrial size was mildly dilated. 6. Right atrial size was  mildly dilated. 7. Trivial pericardial effusion is present. 8. The mitral valve is normal in structure. Mild mitral valve regurgitation. No evidence of mitral stenosis. 9. The tricuspid valve is normal in structure. Tricuspid valve regurgitation is trivial. 10. The aortic valve is tricuspid. Aortic valve regurgitation is not visualized. No evidence of aortic valve sclerosis or stenosis. 11. The tricuspid regurgitant velocity is 2.48 m/s, and with an assumed right atrial pressure of 8 mmHg, the estimated right ventricular systolic pressure is mildly elevated at 32.6 mmHg. 12. The inferior vena cava is normal in size with <50% respiratory variability, suggesting right atrial pressure of 8 mmHg.  Past Medical History:  Diagnosis Date  . Anxiety   . Arthritis    neck  . Bruises easily   . Depression   . Dysrhythmia    hx A-FLUTTER after hip surg April 2015- take metoprolol  . Eczema   . Hip joint pain    RT  . Hypertension   . Memory loss   . Osteoporosis   . Pneumonia    august 2017  . Shortness of breath    with activity  . Stroke [facility])    "mini stroke" per MRI - no deficiets    Past Surgical History:  Procedure Laterality Date  .  ABDOMINAL HYSTERECTOMY    . FRACTURE SURGERY Right 06/05/2013   hip  . HIP ARTHROPLASTY Right 10/03/2013   Procedure: CONVERSION OF PREVIOUS RIGHT HIP SURGERY TO A RIGHT TOTAL HIP ARTHROPLASTY;  Surgeon: Gearlean Alf, MD;  Location: [facility];  Service: Orthopedics;  Laterality: Right;  . INTRAMEDULLARY (IM) NAIL INTERTROCHANTERIC Right 06/05/2013   Procedure: INTRAMEDULLARY (IM) NAIL INTERTROCHANTRIC;  Surgeon: Gearlean Alf, MD;  Location: [facility];  Service: Orthopedics;  Laterality: Right;  . JOINT REPLACEMENT     left hip  . left hip Left 2008  . LEG SURGERY Right    x2     Current Meds  Medication Sig  . amLODipine (NORVASC) 5 MG tablet Take 5 mg by mouth daily.  Marland Kitchen apixaban (ELIQUIS) 2.5 MG TABS tablet Take 1 tablet (2.5 mg total) by  mouth 2 (two) times daily.  . chlorhexidine (PERIDEX) 0.12 % solution Use as directed 15 mLs in the mouth or throat 2 (two) times daily. Swish and spit  . citalopram (CELEXA) 10 MG tablet Take 30 mg by mouth daily.   . furosemide (LASIX) 20 MG tablet Take 1 tablet (20 mg total) by mouth every Monday, Wednesday, and Friday.  . galantamine (RAZADYNE ER) 8 MG 24 hr capsule Take 8 mg by mouth daily with breakfast.  . Multiple Vitamins-Minerals (CERTAVITE SENIOR/ANTIOXIDANT PO) Take 1 tablet by mouth daily.      Allergies:   Flagyl [metronidazole]   Social History   Tobacco Use  . Smoking status: Never Smoker  . Smokeless tobacco: Never Used  Substance Use Topics  . Alcohol use: No    Alcohol/week: 0.0 standard drinks  . Drug use: No     Family Hx: The patient's family history includes Heart attack in her father; Hypertension in her brother, father, and sister; Stroke in her sister.  ROS:   Please see the history of present illness.     All other systems reviewed and are negative.   Labs/Other Tests and Data Reviewed:    Recent Labs: 02/03/2019: Magnesium 1.8; TSH 4.514 04/05/2019: ALT 15 05/14/2019: B Natriuretic Peptide 627.9; BUN 22; Creatinine, Ser 0.86; Hemoglobin 10.2; Platelets 413; Potassium 4.3; Sodium 136   Recent Lipid Panel Lab Results  Component Value Date/Time   CHOL 148 06/30/2014 02:46 AM   TRIG 43 06/30/2014 02:46 AM   HDL 59 06/30/2014 02:46 AM   CHOLHDL 2.5 06/30/2014 02:46 AM   LDLCALC 80 06/30/2014 02:46 AM    Wt Readings from Last 3 Encounters:  05/21/19 101 lb (45.8 kg)  05/06/19 102 lb 11.8 oz (46.6 kg)  04/15/19 93 lb (42.2 kg)     Exam:    Vital Signs:  BP 126/63   Pulse (!) 55   Temp (!) 97.3 F (36.3 C)   Resp 18   Wt 101 lb (45.8 kg)   BMI 17.89 kg/m    Well nourished, well developed female in no  acute distress.   ASSESSMENT & PLAN:    1.   Atrial fibrillation with rapid ventricular rate-heart rate today 55 Continue metoprolol  tartrate 75 mg twice daily Continue apixaban 2.5 mg twice daily Avoid triggers caffeine, chocolate, EtOH, etc. Increase physical activity as tolerated Heart healthy low-sodium diet Start daily weights-call office with weight of 3 pounds overnight or increase of 5 pounds in 1 week.  Chronic diastolic heart failure-no increased lower extremity swelling.  Weight today  101 pounds pounds.  No dyspnea today.  Amyloidosis previously discussed with family.  They were educated about condition and did not wish to do any further testing. Continue furosemide 20 mg tablet Monday Wednesday Friday Continue potassium 20 mEq every other day  Essential hypertension-BP today  126/63.  Well-controlled at facility. Continue metoprolol tartrate 75 mg twice daily Heart healthy low-sodium diet-salty 6 given Increase physical activity as tolerated  Hyperlipidemia-LDL 80 06/2014 Continue simvastatin 10 mg tablet daily Heart healthy low-sodium high-fiber diet Increase physical activity as tolerated  Disposition: Follow-up with Dr. Duke Salvia in 3 months.  COVID-19 Education: The signs and symptoms of COVID-19 were discussed with the patient and how to seek care for testing (follow up with PCP or arrange E-visit).  The importance of social distancing was discussed today.  Patient Risk:   After full review of this patients clinical status, I feel that they are at least moderate risk at this time.  Time:   Today, I have spent 15 minutes with the patient with telehealth technology discussing medications, breathing, heart failure, daily weights.     Medication Adjustments/Labs and Tests Ordered: Current medicines are reviewed at length with the patient today.  Concerns regarding medicines are outlined above.   Tests Ordered: No orders of the defined types were placed in this encounter.  Medication Changes: No orders of the defined types were placed in this encounter.   Disposition:  in 3  month(s)  Signed,   Thomasene Ripple. Shariyah Eland NP-C      [facility] [address] Office [phone] Fax [phone]

## 2019-05-20 ENCOUNTER — Non-Acute Institutional Stay: Payer: Medicare PPO | Admitting: Internal Medicine

## 2019-05-20 DIAGNOSIS — Z515 Encounter for palliative care: Secondary | ICD-10-CM | POA: Diagnosis not present

## 2019-05-20 DIAGNOSIS — R0602 Shortness of breath: Secondary | ICD-10-CM | POA: Diagnosis not present

## 2019-05-20 DIAGNOSIS — R531 Weakness: Secondary | ICD-10-CM | POA: Diagnosis not present

## 2019-05-20 DIAGNOSIS — J441 Chronic obstructive pulmonary disease with (acute) exacerbation: Secondary | ICD-10-CM | POA: Diagnosis not present

## 2019-05-20 DIAGNOSIS — J189 Pneumonia, unspecified organism: Secondary | ICD-10-CM | POA: Diagnosis not present

## 2019-05-20 DIAGNOSIS — J449 Chronic obstructive pulmonary disease, unspecified: Secondary | ICD-10-CM | POA: Diagnosis not present

## 2019-05-20 DIAGNOSIS — Z7401 Bed confinement status: Secondary | ICD-10-CM | POA: Diagnosis not present

## 2019-05-21 ENCOUNTER — Other Ambulatory Visit: Payer: Self-pay

## 2019-05-21 ENCOUNTER — Ambulatory Visit: Payer: Medicare Other | Admitting: Cardiovascular Disease

## 2019-05-21 ENCOUNTER — Telehealth (INDEPENDENT_AMBULATORY_CARE_PROVIDER_SITE_OTHER): Payer: Medicare PPO | Admitting: General Practice

## 2019-05-21 VITALS — BP 126/63 | HR 55 | Temp 97.3°F | Resp 18 | Wt 101.0 lb

## 2019-05-21 DIAGNOSIS — I509 Heart failure, unspecified: Secondary | ICD-10-CM | POA: Diagnosis not present

## 2019-05-21 DIAGNOSIS — I4892 Unspecified atrial flutter: Secondary | ICD-10-CM | POA: Diagnosis not present

## 2019-05-21 DIAGNOSIS — E785 Hyperlipidemia, unspecified: Secondary | ICD-10-CM | POA: Diagnosis not present

## 2019-05-21 DIAGNOSIS — M47812 Spondylosis without myelopathy or radiculopathy, cervical region: Secondary | ICD-10-CM | POA: Diagnosis not present

## 2019-05-21 DIAGNOSIS — F0390 Unspecified dementia without behavioral disturbance: Secondary | ICD-10-CM | POA: Diagnosis not present

## 2019-05-21 DIAGNOSIS — M9701XD Periprosthetic fracture around internal prosthetic right hip joint, subsequent encounter: Secondary | ICD-10-CM | POA: Diagnosis not present

## 2019-05-21 DIAGNOSIS — I5033 Acute on chronic diastolic (congestive) heart failure: Secondary | ICD-10-CM

## 2019-05-21 DIAGNOSIS — I4891 Unspecified atrial fibrillation: Secondary | ICD-10-CM

## 2019-05-21 DIAGNOSIS — E7800 Pure hypercholesterolemia, unspecified: Secondary | ICD-10-CM

## 2019-05-21 DIAGNOSIS — Z03818 Encounter for observation for suspected exposure to other biological agents ruled out: Secondary | ICD-10-CM | POA: Diagnosis not present

## 2019-05-21 DIAGNOSIS — I110 Hypertensive heart disease with heart failure: Secondary | ICD-10-CM | POA: Diagnosis not present

## 2019-05-21 DIAGNOSIS — M810 Age-related osteoporosis without current pathological fracture: Secondary | ICD-10-CM | POA: Diagnosis not present

## 2019-05-21 DIAGNOSIS — I10 Essential (primary) hypertension: Secondary | ICD-10-CM

## 2019-05-21 NOTE — Patient Instructions (Addendum)
Other Instructions TAKE AND LOG DAILY WEIGHT CALL OUR OFFICE WITH WEIGHT CHANGE OF 3 lbs IN A DAY OR 5 lbs IN A WEEK  Follow-Up: Your next appointment:  3 month(s)  Virtual Visit  with Chilton Si, MD;on Date: 08/04/2019 9:40 AM VIRTUAL OFFICE VISIT  At [facility], you and your health needs are our priority.  As part of our continuing mission to provide you with exceptional heart care, we have created designated Provider Care Teams.  These Care Teams include your primary Cardiologist (physician) and Advanced Practice Providers (APPs -  Physician Assistants and Nurse Practitioners) who all work together to provide you with the care you need, when you need it.  We recommend signing up for the patient portal called "MyChart".  Sign up information is provided on this After Visit Summary.  MyChart is used to connect with patients for Virtual Visits (Telemedicine).  Patients are able to view lab/test results, encounter notes, upcoming appointments, etc.  Non-urgent messages can be sent to your provider as well.   To learn more about what you can do with MyChart, go to [email or web address].

## 2019-05-22 DIAGNOSIS — Z20828 Contact with and (suspected) exposure to other viral communicable diseases: Secondary | ICD-10-CM | POA: Diagnosis not present

## 2019-05-22 DIAGNOSIS — D649 Anemia, unspecified: Secondary | ICD-10-CM | POA: Diagnosis not present

## 2019-05-25 DIAGNOSIS — E785 Hyperlipidemia, unspecified: Secondary | ICD-10-CM | POA: Diagnosis not present

## 2019-05-25 DIAGNOSIS — M810 Age-related osteoporosis without current pathological fracture: Secondary | ICD-10-CM | POA: Diagnosis not present

## 2019-05-25 DIAGNOSIS — M47812 Spondylosis without myelopathy or radiculopathy, cervical region: Secondary | ICD-10-CM | POA: Diagnosis not present

## 2019-05-25 DIAGNOSIS — I4892 Unspecified atrial flutter: Secondary | ICD-10-CM | POA: Diagnosis not present

## 2019-05-25 DIAGNOSIS — F0390 Unspecified dementia without behavioral disturbance: Secondary | ICD-10-CM | POA: Diagnosis not present

## 2019-05-25 DIAGNOSIS — I509 Heart failure, unspecified: Secondary | ICD-10-CM | POA: Diagnosis not present

## 2019-05-25 DIAGNOSIS — I110 Hypertensive heart disease with heart failure: Secondary | ICD-10-CM | POA: Diagnosis not present

## 2019-05-25 DIAGNOSIS — I4891 Unspecified atrial fibrillation: Secondary | ICD-10-CM | POA: Diagnosis not present

## 2019-05-25 DIAGNOSIS — M9701XD Periprosthetic fracture around internal prosthetic right hip joint, subsequent encounter: Secondary | ICD-10-CM | POA: Diagnosis not present

## 2019-05-26 DIAGNOSIS — M810 Age-related osteoporosis without current pathological fracture: Secondary | ICD-10-CM | POA: Diagnosis not present

## 2019-05-26 DIAGNOSIS — E785 Hyperlipidemia, unspecified: Secondary | ICD-10-CM | POA: Diagnosis not present

## 2019-05-26 DIAGNOSIS — I4891 Unspecified atrial fibrillation: Secondary | ICD-10-CM | POA: Diagnosis not present

## 2019-05-26 DIAGNOSIS — M9701XD Periprosthetic fracture around internal prosthetic right hip joint, subsequent encounter: Secondary | ICD-10-CM | POA: Diagnosis not present

## 2019-05-26 DIAGNOSIS — F0390 Unspecified dementia without behavioral disturbance: Secondary | ICD-10-CM | POA: Diagnosis not present

## 2019-05-26 DIAGNOSIS — I4892 Unspecified atrial flutter: Secondary | ICD-10-CM | POA: Diagnosis not present

## 2019-05-26 DIAGNOSIS — R05 Cough: Secondary | ICD-10-CM | POA: Diagnosis not present

## 2019-05-26 DIAGNOSIS — I509 Heart failure, unspecified: Secondary | ICD-10-CM | POA: Diagnosis not present

## 2019-05-26 DIAGNOSIS — I110 Hypertensive heart disease with heart failure: Secondary | ICD-10-CM | POA: Diagnosis not present

## 2019-05-26 DIAGNOSIS — I517 Cardiomegaly: Secondary | ICD-10-CM | POA: Diagnosis not present

## 2019-05-26 DIAGNOSIS — M47812 Spondylosis without myelopathy or radiculopathy, cervical region: Secondary | ICD-10-CM | POA: Diagnosis not present

## 2019-05-27 DIAGNOSIS — Z03818 Encounter for observation for suspected exposure to other biological agents ruled out: Secondary | ICD-10-CM | POA: Diagnosis not present

## 2019-05-27 NOTE — Progress Notes (Signed)
[facility] Consult Note Telephone: [phone]  Fax: [phone]  PATIENT NAME: Yolanda Baker DOB: 1923/07/08 MRN: [id number]  PRIMARY CARE PROVIDER:   Crist Infante, MD  REFERRING PROVIDER:  Crist Infante, MD [address] [place],  [place] [address]  RESPONSIBLE PARTY:  Yolanda Baker (daughter)  [phone]  [phone]     RECOMMENDATIONS and PLAN:  Palliative Care Encounter Z51.5  1.  Advance care planning:  Explanation of Palliative care services.  Goals of care were reviewed with patient.  She wants to improve her strength and mobility and plans on continuing participation with PT.  She prefers DNAR/DNI, receive antibiotics and IV fluids if indicated but no feeding tube.  Daughter, via phone, agrees with current advanced directives.  Daughter also wants to maintain quality of life and promote longevity.  Palliative care will continue to follow-up with patient.  2.  Alerted gait:  Related to recent RLE fracture(non-surgical repair).  Guarded ambulation with personal and walker assist.  Continue physical therapy.  Fall risk prevention discussed with patient and caregiver.  Monitor  3.  Memory loss:  FAST stage 7.  Supportive care by assisted living staff and family.  4.  Increased sleeping:  Discussed sleep hygiene.  Decrease day Lorazeam dose to 0.25mg  instead of 0.5mg .  Leave bed time dose at 0.5mg .  Lavender aromatherapy and chamomile tea trial at bedtime to increase night time sedation.   Monitor.   I spent 40 minutes providing this consultation,  from 1200 to 1240. More than 50% of the time in this consultation was spent coordinating communication with patient, caregiver and clinical staff.   HISTORY OF PRESENT ILLNESS:  Yolanda Baker is a 86 y.o. year old female with multiple medical problems including CVA, vascular dementia and RLE fracture related to a fall. Clinical staff reports that she requires assistance with ADLs, is  continent of urine and requires assistance with ambulation or is mobile via wheelchair.  Palliative Care was asked to help address goals of care.   CODE STATUS: DNAR  PPS: 40% HOSPICE ELIGIBILITY/DIAGNOSIS: TBD  PAST MEDICAL HISTORY:  Past Medical History:  Diagnosis Date  . Anxiety   . Arthritis    neck  . Bruises easily   . Depression   . Dysrhythmia    hx A-FLUTTER after hip surg April 2015- take metoprolol  . Eczema   . Hip joint pain    RT  . Hypertension   . Memory loss   . Osteoporosis   . Pneumonia    august 2017  . Shortness of breath    with activity  . Stroke [facility])    "mini stroke" per MRI - no deficiets         PERTINENT MEDICATIONS:  Outpatient Encounter Medications as of 05/20/2019  Medication Sig  . acetaminophen (TYLENOL) 500 MG tablet Take 1 tablet (500 mg total) by mouth 2 (two) times daily as needed for mild pain or moderate pain. (Patient not taking: Reported on 05/21/2019)  . amLODipine (NORVASC) 5 MG tablet Take 5 mg by mouth daily.  Marland Kitchen apixaban (ELIQUIS) 2.5 MG TABS tablet Take 1 tablet (2.5 mg total) by mouth 2 (two) times daily.  . chlorhexidine (PERIDEX) 0.12 % solution Use as directed 15 mLs in the mouth or throat 2 (two) times daily. Swish and spit  . Chlorpheniramine-APAP (CORICIDIN) 2-325 MG TABS Take 1 tablet by mouth 2 (two) times daily as needed (congestion).  . citalopram (CELEXA) 10 MG tablet  Take 30 mg by mouth daily.   . furosemide (LASIX) 20 MG tablet Take 1 tablet (20 mg total) by mouth every Monday, Wednesday, and Friday.  . galantamine (RAZADYNE ER) 8 MG 24 hr capsule Take 8 mg by mouth daily with breakfast.  . HYDROcodone-acetaminophen (NORCO/VICODIN) 5-325 MG tablet Take 1-2 tablets by mouth every 6 (six) hours as needed for moderate pain.  Marland Kitchen ipratropium-albuterol (DUONEB) 0.5-2.5 (3) MG/3ML SOLN Administer by nebulizer every 6 hours as needed for shortness of breath.  Marland Kitchen LORazepam (ATIVAN) 0.5 MG tablet Take 1 tablet (0.5 mg total)  by mouth 2 (two) times daily.  . methocarbamol (ROBAXIN) 500 MG tablet Take 1 tablet (500 mg total) by mouth every 6 (six) hours as needed for muscle spasms.  . metoprolol tartrate (LOPRESSOR) 100 MG tablet Take 100 mg by mouth 2 (two) times daily.  . Multiple Vitamins-Minerals (CERTAVITE SENIOR/ANTIOXIDANT PO) Take 1 tablet by mouth daily.   Bertram Gala Glycol-Propyl Glycol (SYSTANE) 0.4-0.3 % SOLN Place 1 drop into both eyes every hour as needed (dry eyes/irritation).  . potassium chloride SA (K-DUR,KLOR-CON) 20 MEQ tablet Take 20 mEq by mouth every other day.  . psyllium (REGULOID) 0.52 g capsule Take 0.52 g by mouth daily.  . saline (AYR) GEL Place 1 application into both nostrils at bedtime.  . simvastatin (ZOCOR) 10 MG tablet Take 1 tablet (10 mg total) by mouth daily at 6 PM.  . sodium chloride (OCEAN) 0.65 % SOLN nasal spray Place 3 sprays into both nostrils 4 (four) times daily.  Marland Kitchen STIOLTO RESPIMAT 2.5-2.5 MCG/ACT AERS Inhale 2 puffs into the lungs daily.  . ziprasidone (GEODON) 60 MG capsule Take 60 mg by mouth daily.    No facility-administered encounter medications on file as of 05/20/2019.    PHYSICAL EXAM:   General: NAD, frail appearing, thin elderly female resting in recliner Cardiovascular: regular rate and rhythm Pulmonary: clear ant fields Abdomen: soft, nontender, + bowel sounds Extremities: external splint R femur Skin: thin, exposed skin is intact Neurological: Weakness, some memory lapse  Yolanda Sheffield, NP-C

## 2019-05-28 DIAGNOSIS — I4891 Unspecified atrial fibrillation: Secondary | ICD-10-CM | POA: Diagnosis not present

## 2019-05-28 DIAGNOSIS — I110 Hypertensive heart disease with heart failure: Secondary | ICD-10-CM | POA: Diagnosis not present

## 2019-05-28 DIAGNOSIS — F0390 Unspecified dementia without behavioral disturbance: Secondary | ICD-10-CM | POA: Diagnosis not present

## 2019-05-28 DIAGNOSIS — I4892 Unspecified atrial flutter: Secondary | ICD-10-CM | POA: Diagnosis not present

## 2019-05-28 DIAGNOSIS — M47812 Spondylosis without myelopathy or radiculopathy, cervical region: Secondary | ICD-10-CM | POA: Diagnosis not present

## 2019-05-28 DIAGNOSIS — E785 Hyperlipidemia, unspecified: Secondary | ICD-10-CM | POA: Diagnosis not present

## 2019-05-28 DIAGNOSIS — M9701XD Periprosthetic fracture around internal prosthetic right hip joint, subsequent encounter: Secondary | ICD-10-CM | POA: Diagnosis not present

## 2019-05-28 DIAGNOSIS — M810 Age-related osteoporosis without current pathological fracture: Secondary | ICD-10-CM | POA: Diagnosis not present

## 2019-05-28 DIAGNOSIS — I509 Heart failure, unspecified: Secondary | ICD-10-CM | POA: Diagnosis not present

## 2019-06-02 DIAGNOSIS — M9701XD Periprosthetic fracture around internal prosthetic right hip joint, subsequent encounter: Secondary | ICD-10-CM | POA: Diagnosis not present

## 2019-06-02 DIAGNOSIS — E785 Hyperlipidemia, unspecified: Secondary | ICD-10-CM | POA: Diagnosis not present

## 2019-06-02 DIAGNOSIS — I4891 Unspecified atrial fibrillation: Secondary | ICD-10-CM | POA: Diagnosis not present

## 2019-06-02 DIAGNOSIS — I110 Hypertensive heart disease with heart failure: Secondary | ICD-10-CM | POA: Diagnosis not present

## 2019-06-02 DIAGNOSIS — M47812 Spondylosis without myelopathy or radiculopathy, cervical region: Secondary | ICD-10-CM | POA: Diagnosis not present

## 2019-06-02 DIAGNOSIS — I4892 Unspecified atrial flutter: Secondary | ICD-10-CM | POA: Diagnosis not present

## 2019-06-02 DIAGNOSIS — M810 Age-related osteoporosis without current pathological fracture: Secondary | ICD-10-CM | POA: Diagnosis not present

## 2019-06-02 DIAGNOSIS — F0390 Unspecified dementia without behavioral disturbance: Secondary | ICD-10-CM | POA: Diagnosis not present

## 2019-06-02 DIAGNOSIS — I509 Heart failure, unspecified: Secondary | ICD-10-CM | POA: Diagnosis not present

## 2019-06-03 DIAGNOSIS — I110 Hypertensive heart disease with heart failure: Secondary | ICD-10-CM | POA: Diagnosis not present

## 2019-06-03 DIAGNOSIS — E785 Hyperlipidemia, unspecified: Secondary | ICD-10-CM | POA: Diagnosis not present

## 2019-06-03 DIAGNOSIS — I4891 Unspecified atrial fibrillation: Secondary | ICD-10-CM | POA: Diagnosis not present

## 2019-06-03 DIAGNOSIS — I4892 Unspecified atrial flutter: Secondary | ICD-10-CM | POA: Diagnosis not present

## 2019-06-03 DIAGNOSIS — M47812 Spondylosis without myelopathy or radiculopathy, cervical region: Secondary | ICD-10-CM | POA: Diagnosis not present

## 2019-06-03 DIAGNOSIS — F0390 Unspecified dementia without behavioral disturbance: Secondary | ICD-10-CM | POA: Diagnosis not present

## 2019-06-03 DIAGNOSIS — M810 Age-related osteoporosis without current pathological fracture: Secondary | ICD-10-CM | POA: Diagnosis not present

## 2019-06-03 DIAGNOSIS — M9701XD Periprosthetic fracture around internal prosthetic right hip joint, subsequent encounter: Secondary | ICD-10-CM | POA: Diagnosis not present

## 2019-06-03 DIAGNOSIS — I509 Heart failure, unspecified: Secondary | ICD-10-CM | POA: Diagnosis not present

## 2019-06-04 DIAGNOSIS — I509 Heart failure, unspecified: Secondary | ICD-10-CM | POA: Diagnosis not present

## 2019-06-04 DIAGNOSIS — F0390 Unspecified dementia without behavioral disturbance: Secondary | ICD-10-CM | POA: Diagnosis not present

## 2019-06-04 DIAGNOSIS — M810 Age-related osteoporosis without current pathological fracture: Secondary | ICD-10-CM | POA: Diagnosis not present

## 2019-06-04 DIAGNOSIS — M9701XD Periprosthetic fracture around internal prosthetic right hip joint, subsequent encounter: Secondary | ICD-10-CM | POA: Diagnosis not present

## 2019-06-04 DIAGNOSIS — I110 Hypertensive heart disease with heart failure: Secondary | ICD-10-CM | POA: Diagnosis not present

## 2019-06-04 DIAGNOSIS — M47812 Spondylosis without myelopathy or radiculopathy, cervical region: Secondary | ICD-10-CM | POA: Diagnosis not present

## 2019-06-04 DIAGNOSIS — E785 Hyperlipidemia, unspecified: Secondary | ICD-10-CM | POA: Diagnosis not present

## 2019-06-04 DIAGNOSIS — I4892 Unspecified atrial flutter: Secondary | ICD-10-CM | POA: Diagnosis not present

## 2019-06-04 DIAGNOSIS — I4891 Unspecified atrial fibrillation: Secondary | ICD-10-CM | POA: Diagnosis not present

## 2019-06-08 DIAGNOSIS — I4892 Unspecified atrial flutter: Secondary | ICD-10-CM | POA: Diagnosis not present

## 2019-06-08 DIAGNOSIS — I4891 Unspecified atrial fibrillation: Secondary | ICD-10-CM | POA: Diagnosis not present

## 2019-06-08 DIAGNOSIS — M47812 Spondylosis without myelopathy or radiculopathy, cervical region: Secondary | ICD-10-CM | POA: Diagnosis not present

## 2019-06-08 DIAGNOSIS — I110 Hypertensive heart disease with heart failure: Secondary | ICD-10-CM | POA: Diagnosis not present

## 2019-06-08 DIAGNOSIS — M810 Age-related osteoporosis without current pathological fracture: Secondary | ICD-10-CM | POA: Diagnosis not present

## 2019-06-08 DIAGNOSIS — I509 Heart failure, unspecified: Secondary | ICD-10-CM | POA: Diagnosis not present

## 2019-06-08 DIAGNOSIS — M9701XD Periprosthetic fracture around internal prosthetic right hip joint, subsequent encounter: Secondary | ICD-10-CM | POA: Diagnosis not present

## 2019-06-08 DIAGNOSIS — F0390 Unspecified dementia without behavioral disturbance: Secondary | ICD-10-CM | POA: Diagnosis not present

## 2019-06-08 DIAGNOSIS — E785 Hyperlipidemia, unspecified: Secondary | ICD-10-CM | POA: Diagnosis not present

## 2019-06-10 DIAGNOSIS — I509 Heart failure, unspecified: Secondary | ICD-10-CM | POA: Diagnosis not present

## 2019-06-10 DIAGNOSIS — I110 Hypertensive heart disease with heart failure: Secondary | ICD-10-CM | POA: Diagnosis not present

## 2019-06-10 DIAGNOSIS — M810 Age-related osteoporosis without current pathological fracture: Secondary | ICD-10-CM | POA: Diagnosis not present

## 2019-06-10 DIAGNOSIS — M47812 Spondylosis without myelopathy or radiculopathy, cervical region: Secondary | ICD-10-CM | POA: Diagnosis not present

## 2019-06-10 DIAGNOSIS — F0390 Unspecified dementia without behavioral disturbance: Secondary | ICD-10-CM | POA: Diagnosis not present

## 2019-06-10 DIAGNOSIS — E785 Hyperlipidemia, unspecified: Secondary | ICD-10-CM | POA: Diagnosis not present

## 2019-06-10 DIAGNOSIS — I4891 Unspecified atrial fibrillation: Secondary | ICD-10-CM | POA: Diagnosis not present

## 2019-06-10 DIAGNOSIS — Z7401 Bed confinement status: Secondary | ICD-10-CM | POA: Diagnosis not present

## 2019-06-10 DIAGNOSIS — I4892 Unspecified atrial flutter: Secondary | ICD-10-CM | POA: Diagnosis not present

## 2019-06-10 DIAGNOSIS — R2689 Other abnormalities of gait and mobility: Secondary | ICD-10-CM | POA: Diagnosis not present

## 2019-06-10 DIAGNOSIS — M9701XD Periprosthetic fracture around internal prosthetic right hip joint, subsequent encounter: Secondary | ICD-10-CM | POA: Diagnosis not present

## 2019-06-10 DIAGNOSIS — L539 Erythematous condition, unspecified: Secondary | ICD-10-CM | POA: Diagnosis not present

## 2019-06-11 DIAGNOSIS — F0390 Unspecified dementia without behavioral disturbance: Secondary | ICD-10-CM | POA: Diagnosis not present

## 2019-06-11 DIAGNOSIS — I4891 Unspecified atrial fibrillation: Secondary | ICD-10-CM | POA: Diagnosis not present

## 2019-06-11 DIAGNOSIS — M810 Age-related osteoporosis without current pathological fracture: Secondary | ICD-10-CM | POA: Diagnosis not present

## 2019-06-11 DIAGNOSIS — I4892 Unspecified atrial flutter: Secondary | ICD-10-CM | POA: Diagnosis not present

## 2019-06-11 DIAGNOSIS — M47812 Spondylosis without myelopathy or radiculopathy, cervical region: Secondary | ICD-10-CM | POA: Diagnosis not present

## 2019-06-11 DIAGNOSIS — I509 Heart failure, unspecified: Secondary | ICD-10-CM | POA: Diagnosis not present

## 2019-06-11 DIAGNOSIS — I110 Hypertensive heart disease with heart failure: Secondary | ICD-10-CM | POA: Diagnosis not present

## 2019-06-11 DIAGNOSIS — M9701XD Periprosthetic fracture around internal prosthetic right hip joint, subsequent encounter: Secondary | ICD-10-CM | POA: Diagnosis not present

## 2019-06-11 DIAGNOSIS — E785 Hyperlipidemia, unspecified: Secondary | ICD-10-CM | POA: Diagnosis not present

## 2019-06-16 DIAGNOSIS — M25551 Pain in right hip: Secondary | ICD-10-CM | POA: Diagnosis not present

## 2019-06-16 DIAGNOSIS — W19XXXA Unspecified fall, initial encounter: Secondary | ICD-10-CM | POA: Diagnosis not present

## 2019-06-16 DIAGNOSIS — M79651 Pain in right thigh: Secondary | ICD-10-CM | POA: Diagnosis not present

## 2019-06-17 DIAGNOSIS — E785 Hyperlipidemia, unspecified: Secondary | ICD-10-CM | POA: Diagnosis not present

## 2019-06-17 DIAGNOSIS — I4892 Unspecified atrial flutter: Secondary | ICD-10-CM | POA: Diagnosis not present

## 2019-06-17 DIAGNOSIS — I4891 Unspecified atrial fibrillation: Secondary | ICD-10-CM | POA: Diagnosis not present

## 2019-06-17 DIAGNOSIS — M9701XD Periprosthetic fracture around internal prosthetic right hip joint, subsequent encounter: Secondary | ICD-10-CM | POA: Diagnosis not present

## 2019-06-17 DIAGNOSIS — I110 Hypertensive heart disease with heart failure: Secondary | ICD-10-CM | POA: Diagnosis not present

## 2019-06-17 DIAGNOSIS — M47812 Spondylosis without myelopathy or radiculopathy, cervical region: Secondary | ICD-10-CM | POA: Diagnosis not present

## 2019-06-17 DIAGNOSIS — F0390 Unspecified dementia without behavioral disturbance: Secondary | ICD-10-CM | POA: Diagnosis not present

## 2019-06-17 DIAGNOSIS — M810 Age-related osteoporosis without current pathological fracture: Secondary | ICD-10-CM | POA: Diagnosis not present

## 2019-06-17 DIAGNOSIS — I509 Heart failure, unspecified: Secondary | ICD-10-CM | POA: Diagnosis not present

## 2019-06-19 DIAGNOSIS — M9701XD Periprosthetic fracture around internal prosthetic right hip joint, subsequent encounter: Secondary | ICD-10-CM | POA: Diagnosis not present

## 2019-06-19 DIAGNOSIS — I4891 Unspecified atrial fibrillation: Secondary | ICD-10-CM | POA: Diagnosis not present

## 2019-06-19 DIAGNOSIS — M47812 Spondylosis without myelopathy or radiculopathy, cervical region: Secondary | ICD-10-CM | POA: Diagnosis not present

## 2019-06-19 DIAGNOSIS — I4892 Unspecified atrial flutter: Secondary | ICD-10-CM | POA: Diagnosis not present

## 2019-06-19 DIAGNOSIS — I110 Hypertensive heart disease with heart failure: Secondary | ICD-10-CM | POA: Diagnosis not present

## 2019-06-19 DIAGNOSIS — E785 Hyperlipidemia, unspecified: Secondary | ICD-10-CM | POA: Diagnosis not present

## 2019-06-19 DIAGNOSIS — M810 Age-related osteoporosis without current pathological fracture: Secondary | ICD-10-CM | POA: Diagnosis not present

## 2019-06-19 DIAGNOSIS — F0390 Unspecified dementia without behavioral disturbance: Secondary | ICD-10-CM | POA: Diagnosis not present

## 2019-06-19 DIAGNOSIS — I509 Heart failure, unspecified: Secondary | ICD-10-CM | POA: Diagnosis not present

## 2019-06-20 DIAGNOSIS — R0602 Shortness of breath: Secondary | ICD-10-CM | POA: Diagnosis not present

## 2019-06-20 DIAGNOSIS — J449 Chronic obstructive pulmonary disease, unspecified: Secondary | ICD-10-CM | POA: Diagnosis not present

## 2019-06-22 DIAGNOSIS — I509 Heart failure, unspecified: Secondary | ICD-10-CM | POA: Diagnosis not present

## 2019-06-22 DIAGNOSIS — E785 Hyperlipidemia, unspecified: Secondary | ICD-10-CM | POA: Diagnosis not present

## 2019-06-22 DIAGNOSIS — M9701XD Periprosthetic fracture around internal prosthetic right hip joint, subsequent encounter: Secondary | ICD-10-CM | POA: Diagnosis not present

## 2019-06-22 DIAGNOSIS — M810 Age-related osteoporosis without current pathological fracture: Secondary | ICD-10-CM | POA: Diagnosis not present

## 2019-06-22 DIAGNOSIS — F0390 Unspecified dementia without behavioral disturbance: Secondary | ICD-10-CM | POA: Diagnosis not present

## 2019-06-22 DIAGNOSIS — I110 Hypertensive heart disease with heart failure: Secondary | ICD-10-CM | POA: Diagnosis not present

## 2019-06-22 DIAGNOSIS — I4892 Unspecified atrial flutter: Secondary | ICD-10-CM | POA: Diagnosis not present

## 2019-06-22 DIAGNOSIS — M47812 Spondylosis without myelopathy or radiculopathy, cervical region: Secondary | ICD-10-CM | POA: Diagnosis not present

## 2019-06-22 DIAGNOSIS — I4891 Unspecified atrial fibrillation: Secondary | ICD-10-CM | POA: Diagnosis not present

## 2019-06-24 ENCOUNTER — Non-Acute Institutional Stay: Payer: Medicare PPO | Admitting: Internal Medicine

## 2019-06-24 DIAGNOSIS — I509 Heart failure, unspecified: Secondary | ICD-10-CM | POA: Diagnosis not present

## 2019-06-24 DIAGNOSIS — F0150 Vascular dementia without behavioral disturbance: Secondary | ICD-10-CM | POA: Diagnosis not present

## 2019-06-24 DIAGNOSIS — M9701XD Periprosthetic fracture around internal prosthetic right hip joint, subsequent encounter: Secondary | ICD-10-CM | POA: Diagnosis not present

## 2019-06-24 DIAGNOSIS — I110 Hypertensive heart disease with heart failure: Secondary | ICD-10-CM | POA: Diagnosis not present

## 2019-06-24 DIAGNOSIS — Z515 Encounter for palliative care: Secondary | ICD-10-CM | POA: Diagnosis not present

## 2019-06-24 DIAGNOSIS — I4892 Unspecified atrial flutter: Secondary | ICD-10-CM | POA: Diagnosis not present

## 2019-06-24 DIAGNOSIS — F0390 Unspecified dementia without behavioral disturbance: Secondary | ICD-10-CM | POA: Diagnosis not present

## 2019-06-24 DIAGNOSIS — I4891 Unspecified atrial fibrillation: Secondary | ICD-10-CM | POA: Diagnosis not present

## 2019-06-24 DIAGNOSIS — E785 Hyperlipidemia, unspecified: Secondary | ICD-10-CM | POA: Diagnosis not present

## 2019-06-24 DIAGNOSIS — M810 Age-related osteoporosis without current pathological fracture: Secondary | ICD-10-CM | POA: Diagnosis not present

## 2019-06-24 DIAGNOSIS — M47812 Spondylosis without myelopathy or radiculopathy, cervical region: Secondary | ICD-10-CM | POA: Diagnosis not present

## 2019-06-25 NOTE — Progress Notes (Signed)
Therapist, nutritional Palliative Care Consult Note Telephone: [phone]  Fax: [phone]  PATIENT NAME: Yolanda Baker DOB: [birth year] MRN: [id number]  PRIMARY CARE PROVIDER:   Rodrigo Ran, MD  REFERRING PROVIDER:  Rodrigo Ran, MD [address] [place],  [place] [address]  RESPONSIBLE PARTY:  Janne Napoleon (daughter)  [phone]  [phone]     RECOMMENDATIONS and PLAN:  Palliative Care Encounter Z51.5  1.  Advance care planning: DNAR form previously completed.  Daughter also wants to maintain quality of life and promote longevity. Consider transition to Hospice care when eligible and with additional decline.  Palliative care will continue to follow-up with patient.  2.  Alerted gait:  Related to RLE fracture(non-surgical repair).  Pending repeat xrays in 2 weeks   Continue physical therapy.  Fall risk prevention discussed with caregiver.  Monitor  3.  Memory loss:  FAST stage 7c.  Additional decline since last visit.  Consider transition to Hospice care if appropriate.  4.  Increased sleeping:  Discussed sleep hygiene again.   Lorazeam dose was decreased to 0.5mg  at  bed time only instead of BID dosing. Monitor for improvement or additional decline.     I spent 40 minutes providing this consultation,  from 1200 to 1240. More than 50% of the time in this consultation was spent coordinating communication with patient, caregiver, clinical staff and daughter.   HISTORY OF PRESENT ILLNESS: Follow-up with Yolanda Baker.  Clinical staff reports that she requires performance of  ADLs, she is now incontinent of urine, sleeps the majority of the day and is non-ambulatory.  They also report that R femur fracture is not healing and is non alligned.   Palliative Care was asked to help address goals of care.   CODE STATUS: DNAR  PPS: 40% weak  1 person transfer HOSPICE ELIGIBILITY/DIAGNOSIS: TBD  PAST MEDICAL HISTORY:  Past Medical History:  Diagnosis Date  .  Anxiety   . Arthritis    neck  . Bruises easily   . Depression   . Dysrhythmia    hx A-FLUTTER after hip surg April 2015- take metoprolol  . Eczema   . Hip joint pain    RT  . Hypertension   . Memory loss   . Osteoporosis   . Pneumonia    august 2017  . Shortness of breath    with activity  . Stroke [facility])    "mini stroke" per MRI - no deficiets         PERTINENT MEDICATIONS:  Outpatient Encounter Medications as of 06/24/2019  Medication Sig  . acetaminophen (TYLENOL) 500 MG tablet Take 1 tablet (500 mg total) by mouth 2 (two) times daily as needed for mild pain or moderate pain. (Patient not taking: Reported on 05/21/2019)  . amLODipine (NORVASC) 5 MG tablet Take 5 mg by mouth daily.  Marland Kitchen apixaban (ELIQUIS) 2.5 MG TABS tablet Take 1 tablet (2.5 mg total) by mouth 2 (two) times daily.  . chlorhexidine (PERIDEX) 0.12 % solution Use as directed 15 mLs in the mouth or throat 2 (two) times daily. Swish and spit  . Chlorpheniramine-APAP (CORICIDIN) 2-325 MG TABS Take 1 tablet by mouth 2 (two) times daily as needed (congestion).  . citalopram (CELEXA) 10 MG tablet Take 30 mg by mouth daily.   . furosemide (LASIX) 20 MG tablet Take 1 tablet (20 mg total) by mouth every Monday, Wednesday, and Friday.  . galantamine (RAZADYNE ER) 8 MG 24 hr capsule Take 8 mg by  mouth daily with breakfast.  . HYDROcodone-acetaminophen (NORCO/VICODIN) 5-325 MG tablet Take 1-2 tablets by mouth every 6 (six) hours as needed for moderate pain.  Marland Kitchen ipratropium-albuterol (DUONEB) 0.5-2.5 (3) MG/3ML SOLN Administer by nebulizer every 6 hours as needed for shortness of breath.  Marland Kitchen LORazepam (ATIVAN) 0.5 MG tablet Take 1 tablet (0.5 mg total) by mouth 2 (two) times daily.  . methocarbamol (ROBAXIN) 500 MG tablet Take 1 tablet (500 mg total) by mouth every 6 (six) hours as needed for muscle spasms.  . metoprolol tartrate (LOPRESSOR) 100 MG tablet Take 100 mg by mouth 2 (two) times daily.  . Multiple Vitamins-Minerals  (CERTAVITE SENIOR/ANTIOXIDANT PO) Take 1 tablet by mouth daily.   Vladimir Faster Glycol-Propyl Glycol (SYSTANE) 0.4-0.3 % SOLN Place 1 drop into both eyes every hour as needed (dry eyes/irritation).  . potassium chloride SA (K-DUR,KLOR-CON) 20 MEQ tablet Take 20 mEq by mouth every other day.  . psyllium (REGULOID) 0.52 g capsule Take 0.52 g by mouth daily.  . saline (AYR) GEL Place 1 application into both nostrils at bedtime.  . simvastatin (ZOCOR) 10 MG tablet Take 1 tablet (10 mg total) by mouth daily at 6 PM.  . sodium chloride (OCEAN) 0.65 % SOLN nasal spray Place 3 sprays into both nostrils 4 (four) times daily.  Marland Kitchen STIOLTO RESPIMAT 2.5-2.5 MCG/ACT AERS Inhale 2 puffs into the lungs daily.  . ziprasidone (GEODON) 60 MG capsule Take 60 mg by mouth daily.    No facility-administered encounter medications on file as of 06/24/2019.    PHYSICAL EXAM:   General: NAD, frail appearing, thin elderly female resting in recliner Cardiovascular: regular rate and rhythm Pulmonary: clear ant fields Abdomen: soft, nontender, + bowel sounds Extremities: external splint R femur Skin: thin, exposed skin is intact Neurological: Weakness, some memory lapse  Gonzella Lex, NP-C

## 2019-06-30 ENCOUNTER — Telehealth: Payer: Medicare PPO | Admitting: Psychiatry

## 2019-07-01 ENCOUNTER — Non-Acute Institutional Stay: Payer: Medicare PPO | Admitting: Internal Medicine

## 2019-07-01 DIAGNOSIS — M9701XD Periprosthetic fracture around internal prosthetic right hip joint, subsequent encounter: Secondary | ICD-10-CM | POA: Diagnosis not present

## 2019-07-01 DIAGNOSIS — M47812 Spondylosis without myelopathy or radiculopathy, cervical region: Secondary | ICD-10-CM | POA: Diagnosis not present

## 2019-07-01 DIAGNOSIS — I4891 Unspecified atrial fibrillation: Secondary | ICD-10-CM | POA: Diagnosis not present

## 2019-07-01 DIAGNOSIS — F0150 Vascular dementia without behavioral disturbance: Secondary | ICD-10-CM | POA: Diagnosis not present

## 2019-07-01 DIAGNOSIS — F0390 Unspecified dementia without behavioral disturbance: Secondary | ICD-10-CM | POA: Diagnosis not present

## 2019-07-01 DIAGNOSIS — I110 Hypertensive heart disease with heart failure: Secondary | ICD-10-CM | POA: Diagnosis not present

## 2019-07-01 DIAGNOSIS — I509 Heart failure, unspecified: Secondary | ICD-10-CM | POA: Diagnosis not present

## 2019-07-01 DIAGNOSIS — M810 Age-related osteoporosis without current pathological fracture: Secondary | ICD-10-CM | POA: Diagnosis not present

## 2019-07-01 DIAGNOSIS — I4892 Unspecified atrial flutter: Secondary | ICD-10-CM | POA: Diagnosis not present

## 2019-07-01 DIAGNOSIS — Z515 Encounter for palliative care: Secondary | ICD-10-CM

## 2019-07-01 DIAGNOSIS — E785 Hyperlipidemia, unspecified: Secondary | ICD-10-CM | POA: Diagnosis not present

## 2019-07-01 NOTE — Progress Notes (Signed)
[facility] Consult Note Telephone: [phone]  Fax: [phone]  PATIENT NAME: Yolanda Baker DOB: [birth year] MRN: [id number]  PRIMARY CARE PROVIDER:   Crist Infante, MD  REFERRING PROVIDER:  Crist Infante, MD [address] [place],  [place] [address]  RESPONSIBLE PARTY:  Guerry Bruin (daughter)  [phone]  [phone]     RECOMMENDATIONS and PLAN:  Palliative Care Encounter Z51.5  1.  Advance care planning: DNAR in place,  maintain quality of life and promote longevity are primary focus. Monitor ongoing status. Palliative care will continue to follow-up with patient.  2.  Alerted gait:  Unchanged.  Related to RLE fracture(non-surgical repair).  Pending repeat xrays.  Fall risk prevention discussed with caregiver.  Monitor  3.  Memory loss:  FAST stage 7c.  Additional decline since last visit.  Consider transition to Hospice care with additional cognitive and functional decline.    4.  Increased sleeping:  Improving with decrease use of Lorazapam.  Continues to monitor anxiety and degree of sedation.  D/C Lorazepam in the future if she remains overly sedated.   I spent 40 minutes providing this consultation,  from 1600 to 1640. More than 50% of the time in this consultation was spent coordinating communication with patient and  clinical staff.  HISTORY OF PRESENT ILLNESS: Follow-up with Yolanda Baker.  Clinical staff reports no injuries or illness since last visit.  She consistently requires performance of  ADLs, she is now incontinent of urine, excessive sleeping has tapered off since decrease use of Lorazepam to bedtime only.  Palliative Care was asked to help address goals of care.   CODE STATUS: DNAR  PPS: 40% weak  1 person transfer  HOSPICE ELIGIBILITY/DIAGNOSIS: TBD  PAST MEDICAL HISTORY:  Past Medical History:  Diagnosis Date  . Anxiety   . Arthritis    neck  . Bruises easily   . Depression   . Dysrhythmia     hx A-FLUTTER after hip surg April 2015- take metoprolol  . Eczema   . Hip joint pain    RT  . Hypertension   . Memory loss   . Osteoporosis   . Pneumonia    august 2017  . Shortness of breath    with activity  . Stroke [facility]    "mini stroke" per MRI - no deficiets       PERTINENT MEDICATIONS:  Outpatient Encounter Medications as of 06/24/2019  Medication Sig  . acetaminophen (TYLENOL) 500 MG tablet Take 1 tablet (500 mg total) by mouth 2 (two) times daily as needed for mild pain or moderate pain. (Patient not taking: Reported on 05/21/2019)  . amLODipine (NORVASC) 5 MG tablet Take 5 mg by mouth daily.  Marland Kitchen apixaban (ELIQUIS) 2.5 MG TABS tablet Take 1 tablet (2.5 mg total) by mouth 2 (two) times daily.  . chlorhexidine (PERIDEX) 0.12 % solution Use as directed 15 mLs in the mouth or throat 2 (two) times daily. Swish and spit  . Chlorpheniramine-APAP (CORICIDIN) 2-325 MG TABS Take 1 tablet by mouth 2 (two) times daily as needed (congestion).  . citalopram (CELEXA) 10 MG tablet Take 30 mg by mouth daily.   . furosemide (LASIX) 20 MG tablet Take 1 tablet (20 mg total) by mouth every Monday, Wednesday, and Friday.  . galantamine (RAZADYNE ER) 8 MG 24 hr capsule Take 8 mg by mouth daily with breakfast.  . HYDROcodone-acetaminophen (NORCO/VICODIN) 5-325 MG tablet Take 1-2 tablets by mouth every 6 (six) hours  as needed for moderate pain.  Marland Kitchen ipratropium-albuterol (DUONEB) 0.5-2.5 (3) MG/3ML SOLN Administer by nebulizer every 6 hours as needed for shortness of breath.  Marland Kitchen LORazepam (ATIVAN) 0.5 MG tablet Take 1 tablet (0.5 mg total) by mouth 2 (two) times daily.  . methocarbamol (ROBAXIN) 500 MG tablet Take 1 tablet (500 mg total) by mouth every 6 (six) hours as needed for muscle spasms.  . metoprolol tartrate (LOPRESSOR) 100 MG tablet Take 100 mg by mouth 2 (two) times daily.  . Multiple Vitamins-Minerals (CERTAVITE SENIOR/ANTIOXIDANT PO) Take 1 tablet by mouth daily.   Bertram Gala Glycol-Propyl  Glycol (SYSTANE) 0.4-0.3 % SOLN Place 1 drop into both eyes every hour as needed (dry eyes/irritation).  . potassium chloride SA (K-DUR,KLOR-CON) 20 MEQ tablet Take 20 mEq by mouth every other day.  . psyllium (REGULOID) 0.52 g capsule Take 0.52 g by mouth daily.  . saline (AYR) GEL Place 1 application into both nostrils at bedtime.  . simvastatin (ZOCOR) 10 MG tablet Take 1 tablet (10 mg total) by mouth daily at 6 PM.  . sodium chloride (OCEAN) 0.65 % SOLN nasal spray Place 3 sprays into both nostrils 4 (four) times daily.  Marland Kitchen STIOLTO RESPIMAT 2.5-2.5 MCG/ACT AERS Inhale 2 puffs into the lungs daily.  . ziprasidone (GEODON) 60 MG capsule Take 60 mg by mouth daily.    No facility-administered encounter medications on file as of 06/24/2019.    PHYSICAL EXAM:   General: NAD, frail appearing, thin elderly female reclining in bed Cardiovascular: regular rate and rhythm Pulmonary: no increased respiratory effort Extremities: external splint R femur Skin: thin, exposed skin is intact Neurological: Alert and oriented to self weakness, some memory lapse Psych:  Pleasant mood  Margaretha Sheffield, NP-C

## 2019-07-02 ENCOUNTER — Other Ambulatory Visit: Payer: Self-pay

## 2019-07-06 DIAGNOSIS — M47812 Spondylosis without myelopathy or radiculopathy, cervical region: Secondary | ICD-10-CM | POA: Diagnosis not present

## 2019-07-06 DIAGNOSIS — E785 Hyperlipidemia, unspecified: Secondary | ICD-10-CM | POA: Diagnosis not present

## 2019-07-06 DIAGNOSIS — F0390 Unspecified dementia without behavioral disturbance: Secondary | ICD-10-CM | POA: Diagnosis not present

## 2019-07-06 DIAGNOSIS — I110 Hypertensive heart disease with heart failure: Secondary | ICD-10-CM | POA: Diagnosis not present

## 2019-07-06 DIAGNOSIS — I4892 Unspecified atrial flutter: Secondary | ICD-10-CM | POA: Diagnosis not present

## 2019-07-06 DIAGNOSIS — M9701XD Periprosthetic fracture around internal prosthetic right hip joint, subsequent encounter: Secondary | ICD-10-CM | POA: Diagnosis not present

## 2019-07-06 DIAGNOSIS — M810 Age-related osteoporosis without current pathological fracture: Secondary | ICD-10-CM | POA: Diagnosis not present

## 2019-07-06 DIAGNOSIS — I4891 Unspecified atrial fibrillation: Secondary | ICD-10-CM | POA: Diagnosis not present

## 2019-07-06 DIAGNOSIS — I509 Heart failure, unspecified: Secondary | ICD-10-CM | POA: Diagnosis not present

## 2019-07-07 ENCOUNTER — Telehealth: Payer: Self-pay | Admitting: Psychiatry

## 2019-07-07 NOTE — Telephone Encounter (Signed)
Patient's daughter called and said that she wanted to know if dr. Jennelle Human had received the fax from [facility] and looked over it. She wants to talk to him about the information that was faxed. Please give Yolanda Baker a call at [phone]

## 2019-07-07 NOTE — Telephone Encounter (Signed)
Please call the patient's daughter Yolanda Baker [phone] and let her know that I received your letter in the med sheet from [facility] and I do have some recommendations about dosage reduction.  Currently she is on ziprasidone 60 mg every afternoon, that should be reduced to 40 mg every afternoon for 1 month and then reduce again to 20 mg every afternoon. That rate reduction may help her alertness but it has to be done gradually  Currently she is on citalopram 30 mg daily, reduce it to 20 mg daily,  Currently she is on lorazepam 0.5 mg every afternoon, recommend they reduced to 0.25 mg every afternoon if the facility will allow the use of cut tablets.  These changes should help alertness to some degree.  Let us know if there are any changes in her mood or behavior that are of concern.  My best wishes are with her and the family.  I will asked the staff to scan this document into epic.  Meredith Staggers MD

## 2019-07-08 ENCOUNTER — Telehealth: Payer: Self-pay | Admitting: Psychiatry

## 2019-07-08 DIAGNOSIS — M9701XD Periprosthetic fracture around internal prosthetic right hip joint, subsequent encounter: Secondary | ICD-10-CM | POA: Diagnosis not present

## 2019-07-08 DIAGNOSIS — I509 Heart failure, unspecified: Secondary | ICD-10-CM | POA: Diagnosis not present

## 2019-07-08 DIAGNOSIS — I4892 Unspecified atrial flutter: Secondary | ICD-10-CM | POA: Diagnosis not present

## 2019-07-08 DIAGNOSIS — M47812 Spondylosis without myelopathy or radiculopathy, cervical region: Secondary | ICD-10-CM | POA: Diagnosis not present

## 2019-07-08 DIAGNOSIS — F0390 Unspecified dementia without behavioral disturbance: Secondary | ICD-10-CM | POA: Diagnosis not present

## 2019-07-08 DIAGNOSIS — I4891 Unspecified atrial fibrillation: Secondary | ICD-10-CM | POA: Diagnosis not present

## 2019-07-08 DIAGNOSIS — I110 Hypertensive heart disease with heart failure: Secondary | ICD-10-CM | POA: Diagnosis not present

## 2019-07-08 DIAGNOSIS — E785 Hyperlipidemia, unspecified: Secondary | ICD-10-CM | POA: Diagnosis not present

## 2019-07-08 DIAGNOSIS — M810 Age-related osteoporosis without current pathological fracture: Secondary | ICD-10-CM | POA: Diagnosis not present

## 2019-07-08 NOTE — Telephone Encounter (Signed)
According to the med list she was only getting lorazepam 0.5 mg tablets 1 in the evening.  My suggestion was to reduce it to 0.25 mg in the evening because she is sleeping excessively.

## 2019-07-08 NOTE — Telephone Encounter (Signed)
Patient's daughter called and said that the ativan has been discontinued and to send all other scripts to [facility] since they have a pharmacy there.

## 2019-07-08 NOTE — Telephone Encounter (Signed)
Noted. That is fine.  

## 2019-07-09 NOTE — Telephone Encounter (Signed)
Faxed orders this morning. On my desk now.

## 2019-07-09 NOTE — Telephone Encounter (Signed)
Orders faxed this morning.

## 2019-07-11 DIAGNOSIS — M47812 Spondylosis without myelopathy or radiculopathy, cervical region: Secondary | ICD-10-CM | POA: Diagnosis not present

## 2019-07-11 DIAGNOSIS — F0390 Unspecified dementia without behavioral disturbance: Secondary | ICD-10-CM | POA: Diagnosis not present

## 2019-07-11 DIAGNOSIS — E785 Hyperlipidemia, unspecified: Secondary | ICD-10-CM | POA: Diagnosis not present

## 2019-07-11 DIAGNOSIS — M810 Age-related osteoporosis without current pathological fracture: Secondary | ICD-10-CM | POA: Diagnosis not present

## 2019-07-11 DIAGNOSIS — I110 Hypertensive heart disease with heart failure: Secondary | ICD-10-CM | POA: Diagnosis not present

## 2019-07-11 DIAGNOSIS — I4891 Unspecified atrial fibrillation: Secondary | ICD-10-CM | POA: Diagnosis not present

## 2019-07-11 DIAGNOSIS — M9701XD Periprosthetic fracture around internal prosthetic right hip joint, subsequent encounter: Secondary | ICD-10-CM | POA: Diagnosis not present

## 2019-07-11 DIAGNOSIS — I4892 Unspecified atrial flutter: Secondary | ICD-10-CM | POA: Diagnosis not present

## 2019-07-11 DIAGNOSIS — I509 Heart failure, unspecified: Secondary | ICD-10-CM | POA: Diagnosis not present

## 2019-07-14 DIAGNOSIS — M25551 Pain in right hip: Secondary | ICD-10-CM | POA: Diagnosis not present

## 2019-07-14 DIAGNOSIS — M79651 Pain in right thigh: Secondary | ICD-10-CM | POA: Diagnosis not present

## 2019-07-15 ENCOUNTER — Non-Acute Institutional Stay: Payer: Medicare PPO | Admitting: Internal Medicine

## 2019-07-15 DIAGNOSIS — Z515 Encounter for palliative care: Secondary | ICD-10-CM | POA: Diagnosis not present

## 2019-07-15 DIAGNOSIS — R159 Full incontinence of feces: Secondary | ICD-10-CM | POA: Diagnosis not present

## 2019-07-15 DIAGNOSIS — I10 Essential (primary) hypertension: Secondary | ICD-10-CM | POA: Diagnosis not present

## 2019-07-15 DIAGNOSIS — R32 Unspecified urinary incontinence: Secondary | ICD-10-CM | POA: Diagnosis not present

## 2019-07-15 DIAGNOSIS — S72001G Fracture of unspecified part of neck of right femur, subsequent encounter for closed fracture with delayed healing: Secondary | ICD-10-CM | POA: Diagnosis not present

## 2019-07-16 DIAGNOSIS — M810 Age-related osteoporosis without current pathological fracture: Secondary | ICD-10-CM | POA: Diagnosis not present

## 2019-07-16 DIAGNOSIS — I110 Hypertensive heart disease with heart failure: Secondary | ICD-10-CM | POA: Diagnosis not present

## 2019-07-16 DIAGNOSIS — I4892 Unspecified atrial flutter: Secondary | ICD-10-CM | POA: Diagnosis not present

## 2019-07-16 DIAGNOSIS — F0390 Unspecified dementia without behavioral disturbance: Secondary | ICD-10-CM | POA: Diagnosis not present

## 2019-07-16 DIAGNOSIS — M47812 Spondylosis without myelopathy or radiculopathy, cervical region: Secondary | ICD-10-CM | POA: Diagnosis not present

## 2019-07-16 DIAGNOSIS — E785 Hyperlipidemia, unspecified: Secondary | ICD-10-CM | POA: Diagnosis not present

## 2019-07-16 DIAGNOSIS — M9701XD Periprosthetic fracture around internal prosthetic right hip joint, subsequent encounter: Secondary | ICD-10-CM | POA: Diagnosis not present

## 2019-07-16 DIAGNOSIS — I509 Heart failure, unspecified: Secondary | ICD-10-CM | POA: Diagnosis not present

## 2019-07-16 DIAGNOSIS — I4891 Unspecified atrial fibrillation: Secondary | ICD-10-CM | POA: Diagnosis not present

## 2019-07-17 DIAGNOSIS — M9701XD Periprosthetic fracture around internal prosthetic right hip joint, subsequent encounter: Secondary | ICD-10-CM | POA: Diagnosis not present

## 2019-07-17 DIAGNOSIS — M47812 Spondylosis without myelopathy or radiculopathy, cervical region: Secondary | ICD-10-CM | POA: Diagnosis not present

## 2019-07-17 DIAGNOSIS — I509 Heart failure, unspecified: Secondary | ICD-10-CM | POA: Diagnosis not present

## 2019-07-17 DIAGNOSIS — I4891 Unspecified atrial fibrillation: Secondary | ICD-10-CM | POA: Diagnosis not present

## 2019-07-17 DIAGNOSIS — E785 Hyperlipidemia, unspecified: Secondary | ICD-10-CM | POA: Diagnosis not present

## 2019-07-17 DIAGNOSIS — I4892 Unspecified atrial flutter: Secondary | ICD-10-CM | POA: Diagnosis not present

## 2019-07-17 DIAGNOSIS — F0390 Unspecified dementia without behavioral disturbance: Secondary | ICD-10-CM | POA: Diagnosis not present

## 2019-07-17 DIAGNOSIS — M810 Age-related osteoporosis without current pathological fracture: Secondary | ICD-10-CM | POA: Diagnosis not present

## 2019-07-17 DIAGNOSIS — I110 Hypertensive heart disease with heart failure: Secondary | ICD-10-CM | POA: Diagnosis not present

## 2019-07-20 DIAGNOSIS — R0602 Shortness of breath: Secondary | ICD-10-CM | POA: Diagnosis not present

## 2019-07-20 DIAGNOSIS — J449 Chronic obstructive pulmonary disease, unspecified: Secondary | ICD-10-CM | POA: Diagnosis not present

## 2019-07-21 DIAGNOSIS — I509 Heart failure, unspecified: Secondary | ICD-10-CM | POA: Diagnosis not present

## 2019-07-21 DIAGNOSIS — M9701XD Periprosthetic fracture around internal prosthetic right hip joint, subsequent encounter: Secondary | ICD-10-CM | POA: Diagnosis not present

## 2019-07-21 DIAGNOSIS — I4892 Unspecified atrial flutter: Secondary | ICD-10-CM | POA: Diagnosis not present

## 2019-07-21 DIAGNOSIS — M810 Age-related osteoporosis without current pathological fracture: Secondary | ICD-10-CM | POA: Diagnosis not present

## 2019-07-21 DIAGNOSIS — F0390 Unspecified dementia without behavioral disturbance: Secondary | ICD-10-CM | POA: Diagnosis not present

## 2019-07-21 DIAGNOSIS — M47812 Spondylosis without myelopathy or radiculopathy, cervical region: Secondary | ICD-10-CM | POA: Diagnosis not present

## 2019-07-21 DIAGNOSIS — I4891 Unspecified atrial fibrillation: Secondary | ICD-10-CM | POA: Diagnosis not present

## 2019-07-21 DIAGNOSIS — E785 Hyperlipidemia, unspecified: Secondary | ICD-10-CM | POA: Diagnosis not present

## 2019-07-21 DIAGNOSIS — I110 Hypertensive heart disease with heart failure: Secondary | ICD-10-CM | POA: Diagnosis not present

## 2019-07-23 DIAGNOSIS — M9701XD Periprosthetic fracture around internal prosthetic right hip joint, subsequent encounter: Secondary | ICD-10-CM | POA: Diagnosis not present

## 2019-07-23 DIAGNOSIS — M810 Age-related osteoporosis without current pathological fracture: Secondary | ICD-10-CM | POA: Diagnosis not present

## 2019-07-23 DIAGNOSIS — I4892 Unspecified atrial flutter: Secondary | ICD-10-CM | POA: Diagnosis not present

## 2019-07-23 DIAGNOSIS — M47812 Spondylosis without myelopathy or radiculopathy, cervical region: Secondary | ICD-10-CM | POA: Diagnosis not present

## 2019-07-23 DIAGNOSIS — F0390 Unspecified dementia without behavioral disturbance: Secondary | ICD-10-CM | POA: Diagnosis not present

## 2019-07-23 DIAGNOSIS — E785 Hyperlipidemia, unspecified: Secondary | ICD-10-CM | POA: Diagnosis not present

## 2019-07-23 DIAGNOSIS — I509 Heart failure, unspecified: Secondary | ICD-10-CM | POA: Diagnosis not present

## 2019-07-23 DIAGNOSIS — I110 Hypertensive heart disease with heart failure: Secondary | ICD-10-CM | POA: Diagnosis not present

## 2019-07-23 DIAGNOSIS — I4891 Unspecified atrial fibrillation: Secondary | ICD-10-CM | POA: Diagnosis not present

## 2019-07-29 DIAGNOSIS — I4892 Unspecified atrial flutter: Secondary | ICD-10-CM | POA: Diagnosis not present

## 2019-07-29 DIAGNOSIS — I509 Heart failure, unspecified: Secondary | ICD-10-CM | POA: Diagnosis not present

## 2019-07-29 DIAGNOSIS — M47812 Spondylosis without myelopathy or radiculopathy, cervical region: Secondary | ICD-10-CM | POA: Diagnosis not present

## 2019-07-29 DIAGNOSIS — F0390 Unspecified dementia without behavioral disturbance: Secondary | ICD-10-CM | POA: Diagnosis not present

## 2019-07-29 DIAGNOSIS — I110 Hypertensive heart disease with heart failure: Secondary | ICD-10-CM | POA: Diagnosis not present

## 2019-07-29 DIAGNOSIS — I4891 Unspecified atrial fibrillation: Secondary | ICD-10-CM | POA: Diagnosis not present

## 2019-07-29 DIAGNOSIS — M810 Age-related osteoporosis without current pathological fracture: Secondary | ICD-10-CM | POA: Diagnosis not present

## 2019-07-29 DIAGNOSIS — M9701XD Periprosthetic fracture around internal prosthetic right hip joint, subsequent encounter: Secondary | ICD-10-CM | POA: Diagnosis not present

## 2019-07-29 DIAGNOSIS — E785 Hyperlipidemia, unspecified: Secondary | ICD-10-CM | POA: Diagnosis not present

## 2019-07-30 ENCOUNTER — Ambulatory Visit (HOSPITAL_COMMUNITY): Payer: Medicare Other

## 2019-08-03 NOTE — Progress Notes (Signed)
Therapist, nutritional Palliative Care Consult Note Telephone: [phone]  Fax: [phone]  PATIENT NAME: Yolanda Baker DOB: [birth year] MRN: [id number]  PRIMARY CARE PROVIDER:   Rodrigo Ran, MD  REFERRING PROVIDER:  Rodrigo Ran, MD [address] [place],  [place] [address]  RESPONSIBLE PARTY:  Yolanda Baker (daughter)  [phone]  [phone]     RECOMMENDATIONS and PLAN:  Palliative Care Encounter Z51.5  1.  Advance care planning: DNAR in place,  maintain quality of life with assistance of hire aid. Monitor ongoing status. Palliative care will continue to follow-up with patient.  2.  Alerted gait:  Unchanged.  Related to RLE fracture(non-surgical repair).  Fall risk prevention discussed with caregiver.  Monitor  3.  Memory loss:  FAST stage 7c.  At new baseline.  Moderate nutritional intake. Continue supportive care and monitor for additional cognitive or functional decline.     4.  Increased sleeping:  Improving with  use of Lorazapam at bedtime only..  Continues to monitor anxiety and degree of sedation.  D/C Lorazepam in the future over sedation resumes.   I spent 40 minutes providing this consultation,  from 1100 to 1140. More than 50% of the time in this consultation was spent coordinating communication with patient, daughter and clinical staff.  HISTORY OF PRESENT ILLNESS: Follow-up with Yolanda Baker.  Clinical staff reports no injuries or illness since last visit.  She remains dependant upon staff for ADLs and is non-ambulatory, she is now incontinent of urine, excessive sleeping has tapered off since decrease use of Lorazepam to bedtime only. Staff reports that she eats at least half of her meal. Palliative Care was asked to help address goals of care.   CODE STATUS: DNAR  PPS: 40% weak  1 person transfer  HOSPICE ELIGIBILITY/DIAGNOSIS: TBD  PAST MEDICAL HISTORY:  Past Medical History:  Diagnosis Date  . Anxiety   . Arthritis    neck    . Bruises easily   . Depression   . Dysrhythmia    hx A-FLUTTER after hip surg April 2015- take metoprolol  . Eczema   . Hip joint pain    RT  . Hypertension   . Memory loss   . Osteoporosis   . Pneumonia    august 2017  . Shortness of breath    with activity  . Stroke [facility])    "mini stroke" per MRI - no deficiets       PERTINENT MEDICATIONS:  Outpatient Encounter Medications as of 06/24/2019  Medication Sig  . acetaminophen (TYLENOL) 500 MG tablet Take 1 tablet (500 mg total) by mouth 2 (two) times daily as needed for mild pain or moderate pain. (Patient not taking: Reported on 05/21/2019)  . amLODipine (NORVASC) 5 MG tablet Take 5 mg by mouth daily.  Marland Kitchen apixaban (ELIQUIS) 2.5 MG TABS tablet Take 1 tablet (2.5 mg total) by mouth 2 (two) times daily.  . chlorhexidine (PERIDEX) 0.12 % solution Use as directed 15 mLs in the mouth or throat 2 (two) times daily. Swish and spit  . Chlorpheniramine-APAP (CORICIDIN) 2-325 MG TABS Take 1 tablet by mouth 2 (two) times daily as needed (congestion).  . citalopram (CELEXA) 10 MG tablet Take 30 mg by mouth daily.   . furosemide (LASIX) 20 MG tablet Take 1 tablet (20 mg total) by mouth every Monday, Wednesday, and Friday.  . galantamine (RAZADYNE ER) 8 MG 24 hr capsule Take 8 mg by mouth daily with breakfast.  . HYDROcodone-acetaminophen (NORCO/VICODIN)  5-325 MG tablet Take 1-2 tablets by mouth every 6 (six) hours as needed for moderate pain.  Marland Kitchen ipratropium-albuterol (DUONEB) 0.5-2.5 (3) MG/3ML SOLN Administer by nebulizer every 6 hours as needed for shortness of breath.  Marland Kitchen LORazepam (ATIVAN) 0.5 MG tablet Take 1 tablet (0.5 mg total)  At bedtime  . methocarbamol (ROBAXIN) 500 MG tablet Take 1 tablet (500 mg total) by mouth every 6 (six) hours as needed for muscle spasms.  . metoprolol tartrate (LOPRESSOR) 100 MG tablet Take 100 mg by mouth 2 (two) times daily.  . Multiple Vitamins-Minerals (CERTAVITE SENIOR/ANTIOXIDANT PO) Take 1 tablet by mouth  daily.   Vladimir Faster Glycol-Propyl Glycol (SYSTANE) 0.4-0.3 % SOLN Place 1 drop into both eyes every hour as needed (dry eyes/irritation).  . potassium chloride SA (K-DUR,KLOR-CON) 20 MEQ tablet Take 20 mEq by mouth every other day.  . psyllium (REGULOID) 0.52 g capsule Take 0.52 g by mouth daily.  . saline (AYR) GEL Place 1 application into both nostrils at bedtime.  . simvastatin (ZOCOR) 10 MG tablet Take 1 tablet (10 mg total) by mouth daily at 6 PM.  . sodium chloride (OCEAN) 0.65 % SOLN nasal spray Place 3 sprays into both nostrils 4 (four) times daily.  Marland Kitchen STIOLTO RESPIMAT 2.5-2.5 MCG/ACT AERS Inhale 2 puffs into the lungs daily.  . ziprasidone (GEODON) 60 MG capsule Take 60 mg by mouth daily.    No facility-administered encounter medications on file as of 06/24/2019.    PHYSICAL EXAM:   General: NAD, frail appearing, thin elderly female reclining in bed Cardiovascular: regular rate and rhythm Pulmonary: no increased respiratory effort Extremities: external splint R femur Skin: thin, exposed skin is intact Neurological: Alert and oriented to self, weakness.  Speaks in 2-3 words Psych:  Pleasant mood  Gonzella Lex, NP-C

## 2019-08-04 ENCOUNTER — Telehealth (INDEPENDENT_AMBULATORY_CARE_PROVIDER_SITE_OTHER): Payer: Medicare PPO | Admitting: Cardiovascular Disease

## 2019-08-04 ENCOUNTER — Encounter: Payer: Self-pay | Admitting: Cardiovascular Disease

## 2019-08-04 VITALS — BP 140/71 | HR 56 | Temp 97.0°F | Resp 18 | Ht 61.0 in | Wt 100.0 lb

## 2019-08-04 DIAGNOSIS — I480 Paroxysmal atrial fibrillation: Secondary | ICD-10-CM | POA: Diagnosis not present

## 2019-08-04 DIAGNOSIS — I4819 Other persistent atrial fibrillation: Secondary | ICD-10-CM

## 2019-08-04 DIAGNOSIS — I5033 Acute on chronic diastolic (congestive) heart failure: Secondary | ICD-10-CM

## 2019-08-04 HISTORY — DX: Other persistent atrial fibrillation: I48.19

## 2019-08-04 NOTE — Patient Instructions (Signed)
Medication Instructions:  CONTINUE WITH CURRENT MEDICATIONS. NO CHANGES.  *If you need a refill on your cardiac medications before your next appointment, please call your pharmacy*   Lab Work: BNP and BMP If you have labs (blood work) drawn today and your tests are completely normal, you will receive your results only by: Marland Kitchen MyChart Message (if you have MyChart) OR . A paper copy in the mail If you have any lab test that is abnormal or we need to change your treatment, we will call you to review the results.   Follow-Up: At [facility], you and your health needs are our priority.  As part of our continuing mission to provide you with exceptional heart care, we have created designated Provider Care Teams.  These Care Teams include your primary Cardiologist (physician) and Advanced Practice Providers (APPs -  Physician Assistants and Nurse Practitioners) who all work together to provide you with the care you need, when you need it.  We recommend signing up for the patient portal called "MyChart".  Sign up information is provided on this After Visit Summary.  MyChart is used to connect with patients for Virtual Visits (Telemedicine).  Patients are able to view lab/test results, encounter notes, upcoming appointments, etc.  Non-urgent messages can be sent to your provider as well.   To learn more about what you can do with MyChart, go to [email or web address].    Your next appointment:   2 month(s)  The format for your next appointment:   In Person  Provider:   Chilton Si, MD

## 2019-08-04 NOTE — Progress Notes (Signed)
Virtual Visit via Telephone Note   This visit type was conducted due to national recommendations for restrictions regarding the COVID-19 Pandemic (e.g. social distancing) in an effort to limit this patient's exposure and mitigate transmission in our community.  Due to her co-morbid illnesses, this patient is at least at moderate risk for complications without adequate follow up.  This format is felt to be most appropriate for this patient at this time.  The patient did not have access to video technology/had technical difficulties with video requiring transitioning to audio format only (telephone).  All issues noted in this document were discussed and addressed.  No physical exam could be performed with this format.  Please refer to the patient's chart for her  consent to telehealth for [facility].   The patient was identified using 2 identifiers.  Date:  08/04/2019   ID:  Yolanda Baker, DOB December 03, 1923, MRN [id number]  Patient Location: Skilled Nursing Facility Provider Location: Office  PCP:  Rodrigo Ran, MD  Cardiologist:  Chilton Si, MD  Electrophysiologist:  None   Evaluation Performed:  Follow-Up Visit  Chief Complaint:  Shortness of breath  History of Present Illness:    Yolanda Baker is a 86 y.o. female with atrial fibrillation, hypertension, prior stroke, and dementia here for follow-up.  She was admitted 01/2019 with shortness of breath.  She was noted to be in new onset atrial fibrillation with rapid ventricular response.  She was mildly volume overloaded with a BNP of 335 and a pleural effusion on chest x-ray.  Creatinine was 1.8.  Symptoms improved with IV metoprolol and Lasix.  She was started on oral metoprolol and Lasix prior to discharge.  Echo at that time revealed LVEF 50 to 55% with moderate LVH and a possibility of cardiac amyloidosis.  Diastolic function was indeterminate.  Right ventricular function was normal.  She was seen by Edd Fabian, NP, on 01/2019.   The possibility of working her up for amyloidosis was discussed with her daughter and she like to not to pursue it.  She was admitted to the hospital 04/2019 with a right periprosthetic hip fracture.  This occurred in the setting of falling out of her bed.  Conservative treatment with physical therapy and hip bracing was elected.  She was then admitted 04/2019 with shortness of breath thought to be due to pneumonia.  She followed up with Verdon Cummins virtually on 04/2019 and was doing better.  Lately she has been feeling mostly well.  She notes that she is sometimes short of breath. She notices it most when she is active.  She sometimes notes it when laying in bed as well.  She has no orthopnea or PND.  Her weight has been stable around 100lb. She and her daughter have not noticed any edema.  She is still wearing a brace and is non-weight bearing.  She hopes to be able to walk soon but is waiting on a follow-up x-ray.  She has her blood pressure checked sporadically at her assisted living facility.  Her blood pressure tends to run in the 130s to 150s over 60s to 80s in the morning before taking her medication.  Later in the day it is in the 120s over 60s.  Her heart rate is pretty consistently in the 60s to 70s.  She denies palpitations.   The patient does not have symptoms concerning for COVID-19 infection (fever, chills, cough, or new shortness of breath).    Past Medical History:  Diagnosis Date  .  Anxiety   . Arthritis    neck  . Bruises easily   . Depression   . Dysrhythmia    hx A-FLUTTER after hip surg April 2015- take metoprolol  . Eczema   . Hip joint pain    RT  . Hypertension   . Memory loss   . Osteoporosis   . PAF (paroxysmal atrial fibrillation) ([place]) 08/04/2019  . Pneumonia    august 2017  . Shortness of breath    with activity  . Stroke [facility])    "mini stroke" per MRI - no deficiets    Past Surgical History:  Procedure Laterality Date  . ABDOMINAL HYSTERECTOMY    . FRACTURE SURGERY  Right 06/05/2013   hip  . HIP ARTHROPLASTY Right 10/03/2013   Procedure: CONVERSION OF PREVIOUS RIGHT HIP SURGERY TO A RIGHT TOTAL HIP ARTHROPLASTY;  Surgeon: Gearlean Alf, MD;  Location: [facility];  Service: Orthopedics;  Laterality: Right;  . INTRAMEDULLARY (IM) NAIL INTERTROCHANTERIC Right 06/05/2013   Procedure: INTRAMEDULLARY (IM) NAIL INTERTROCHANTRIC;  Surgeon: Gearlean Alf, MD;  Location: [facility];  Service: Orthopedics;  Laterality: Right;  . JOINT REPLACEMENT     left hip  . left hip Left 2008  . LEG SURGERY Right    x2     Current Meds  Medication Sig  . acetaminophen (TYLENOL) 500 MG tablet Take 1 tablet (500 mg total) by mouth 2 (two) times daily as needed for mild pain or moderate pain.  Marland Kitchen amLODipine (NORVASC) 5 MG tablet Take 5 mg by mouth daily.  Marland Kitchen apixaban (ELIQUIS) 2.5 MG TABS tablet Take 1 tablet (2.5 mg total) by mouth 2 (two) times daily.  . chlorhexidine (PERIDEX) 0.12 % solution Use as directed 15 mLs in the mouth or throat 2 (two) times daily. Swish and spit  . citalopram (CELEXA) 10 MG tablet Take 20 mg by mouth daily.   . furosemide (LASIX) 20 MG tablet Take 1 tablet (20 mg total) by mouth every Monday, Wednesday, and Friday.  . galantamine (RAZADYNE ER) 8 MG 24 hr capsule Take 8 mg by mouth daily with breakfast.  . HYDROcodone-acetaminophen (NORCO/VICODIN) 5-325 MG tablet Take 1-2 tablets by mouth every 6 (six) hours as needed for moderate pain.  Marland Kitchen LORazepam (ATIVAN) 0.5 MG tablet Take 1 tablet (0.5 mg total) by mouth 2 (two) times daily. (Patient taking differently: Take 0.5 mg by mouth daily. )  . metoprolol tartrate (LOPRESSOR) 100 MG tablet Take 25 mg by mouth 2 (two) times daily.   . Multiple Vitamins-Minerals (CERTAVITE SENIOR/ANTIOXIDANT PO) Take 1 tablet by mouth daily.   Vladimir Faster Glycol-Propyl Glycol (SYSTANE) 0.4-0.3 % SOLN Place 1 drop into both eyes every hour as needed (dry eyes/irritation).  . potassium chloride SA (K-DUR,KLOR-CON) 20 MEQ tablet  Take 20 mEq by mouth every other day.  . simvastatin (ZOCOR) 10 MG tablet Take 1 tablet (10 mg total) by mouth daily at 6 PM.  . STIOLTO RESPIMAT 2.5-2.5 MCG/ACT AERS Inhale 2 puffs into the lungs daily.  . ziprasidone (GEODON) 60 MG capsule Take 40 mg by mouth daily. 40mg  in the afternoon     Allergies:   Flagyl [metronidazole]   Social History   Tobacco Use  . Smoking status: Never Smoker  . Smokeless tobacco: Never Used  Substance Use Topics  . Alcohol use: No    Alcohol/week: 0.0 standard drinks  . Drug use: No     Family Hx: The patient's family history includes Heart attack in her father;  Hypertension in her brother, father, and sister; Stroke in her sister.  ROS:   Please see the history of present illness.     All other systems reviewed and are negative.   Prior CV studies:   The following studies were reviewed today:  Echo  1. Left ventricular ejection fraction, by visual estimation, is 50 to  55%. The left ventricle has low normal function. There is moderately  increased left ventricular hypertrophy. The LV morphology could be  consistent with cardiac amyloidosis.  2. Left ventricular diastolic parameters are indeterminate.  3. The left ventricle has no regional wall motion abnormalities.  4. Global right ventricle has normal systolic function.The right  ventricular size is normal. No increase in right ventricular wall  thickness.  5. Left atrial size was mildly dilated.  6. Right atrial size was mildly dilated.  7. Trivial pericardial effusion is present.  8. The mitral valve is normal in structure. Mild mitral valve  regurgitation. No evidence of mitral stenosis.  9. The tricuspid valve is normal in structure. Tricuspid valve  regurgitation is trivial.  10. The aortic valve is tricuspid. Aortic valve regurgitation is not  visualized. No evidence of aortic valve sclerosis or stenosis.  11. The tricuspid regurgitant velocity is 2.48 m/s, and with an  assumed  right atrial pressure of 8 mmHg, the estimated right ventricular systolic  pressure is mildly elevated at 32.6 mmHg.  12. The inferior vena cava is normal in size with <50% respiratory  variability, suggesting right atrial pressure of 8 mmHg.   Labs/Other Tests and Data Reviewed:    EKG:  No ECG reviewed.   Echo 01/2019:  1. Left ventricular ejection fraction, by visual estimation, is 50 to  55%. The left ventricle has low normal function. There is moderately  increased left ventricular hypertrophy. The LV morphology could be  consistent with cardiac amyloidosis.  2. Left ventricular diastolic parameters are indeterminate.  3. The left ventricle has no regional wall motion abnormalities.  4. Global right ventricle has normal systolic function.The right  ventricular size is normal. No increase in right ventricular wall  thickness.  5. Left atrial size was mildly dilated.  6. Right atrial size was mildly dilated.  7. Trivial pericardial effusion is present.  8. The mitral valve is normal in structure. Mild mitral valve  regurgitation. No evidence of mitral stenosis.  9. The tricuspid valve is normal in structure. Tricuspid valve  regurgitation is trivial.  10. The aortic valve is tricuspid. Aortic valve regurgitation is not  visualized. No evidence of aortic valve sclerosis or stenosis.  11. The tricuspid regurgitant velocity is 2.48 m/s, and with an assumed  right atrial pressure of 8 mmHg, the estimated right ventricular systolic  pressure is mildly elevated at 32.6 mmHg.  12. The inferior vena cava is normal in size with <50% respiratory  variability, suggesting right atrial pressure of 8 mmHg.   Recent Labs: 02/03/2019: Magnesium 1.8; TSH 4.514 04/05/2019: ALT 15 05/14/2019: B Natriuretic Peptide 627.9; BUN 22; Creatinine, Ser 0.86; Hemoglobin 10.2; Platelets 413; Potassium 4.3; Sodium 136   Recent Lipid Panel Lab Results  Component Value Date/Time   CHOL  148 06/30/2014 02:46 AM   TRIG 43 06/30/2014 02:46 AM   HDL 59 06/30/2014 02:46 AM   CHOLHDL 2.5 06/30/2014 02:46 AM   LDLCALC 80 06/30/2014 02:46 AM    Wt Readings from Last 3 Encounters:  08/04/19 100 lb (45.4 kg)  05/21/19 101 lb (45.8 kg)  05/06/19 102 lb  11.8 oz (46.6 kg)     Objective:    BP 140/71   Pulse (!) 56   Temp (!) 97 F (36.1 C)   Resp 18   Ht 5\' 1"  (1.549 m)   Wt 100 lb (45.4 kg)   BMI 18.89 kg/m  GENERAL: Sounds well.  No acute distress. RESP: Respirations unlabored NEURO:  Speech fluent.   PSYCH:  Oriented to person and place   ASSESSMENT & PLAN:    # Paroxysmal atrial fibrillation: She is presumably in sinus rhythm.  Rates have been consistently well-controlled when checked.  Continue metoprolol and Eliquis.  # Acute on chronic diastolic heart failure: Ms. Karrer reports exertional dyspnea.  I am unable to examine her today.  It does not seem as though she has edema and her weight has been stable.  She reports that her diet is good, so I suspect that she is not losing weight while accumulating fluid.  We will check a BNP and a BMP to be sure.  If BNP is elevated, we will increase furosemide.  We will asked that she follow-up in office next time so that we can do a better assessment of volume status.  # Essential hypertension:  BP tends to run high in the morning before her medication and then improves throughout the day.  Continue amlodipine and metoprolol as currently dosed.  # Hyperlipidemia:  Continue simvastatin.  COVID-19 Education: The signs and symptoms of COVID-19 were discussed with the patient and how to seek care for testing (follow up with PCP or arrange E-visit).  The importance of social distancing was discussed today.  Time:   Today, I have spent 17 minutes with the patient with telehealth technology discussing the above problems.     Medication Adjustments/Labs and Tests Ordered: Current medicines are reviewed at length with the  patient today.  Concerns regarding medicines are outlined above.   Tests Ordered: Orders Placed This Encounter  Procedures  . Basic metabolic panel  . Brain natriuretic peptide    Medication Changes: No orders of the defined types were placed in this encounter.   Follow Up:  In Person in 2 month(s)  Signed, Einar Gip, MD  08/04/2019 10:26 AM    [facility]

## 2019-08-05 ENCOUNTER — Ambulatory Visit (HOSPITAL_COMMUNITY)
Admission: RE | Admit: 2019-08-05 | Discharge: 2019-08-05 | Disposition: A | Discharge status: Home / Self Care | Payer: Medicare PPO | Source: Ambulatory Visit | Attending: Internal Medicine | Admitting: Internal Medicine

## 2019-08-05 ENCOUNTER — Other Ambulatory Visit: Payer: Self-pay

## 2019-08-05 ENCOUNTER — Encounter (HOSPITAL_COMMUNITY): Payer: Self-pay

## 2019-08-05 DIAGNOSIS — M810 Age-related osteoporosis without current pathological fracture: Secondary | ICD-10-CM | POA: Diagnosis not present

## 2019-08-05 DIAGNOSIS — I509 Heart failure, unspecified: Secondary | ICD-10-CM | POA: Diagnosis not present

## 2019-08-05 DIAGNOSIS — I4892 Unspecified atrial flutter: Secondary | ICD-10-CM | POA: Diagnosis not present

## 2019-08-05 DIAGNOSIS — M47812 Spondylosis without myelopathy or radiculopathy, cervical region: Secondary | ICD-10-CM | POA: Diagnosis not present

## 2019-08-05 DIAGNOSIS — M9701XD Periprosthetic fracture around internal prosthetic right hip joint, subsequent encounter: Secondary | ICD-10-CM | POA: Diagnosis not present

## 2019-08-05 DIAGNOSIS — F0390 Unspecified dementia without behavioral disturbance: Secondary | ICD-10-CM | POA: Diagnosis not present

## 2019-08-05 DIAGNOSIS — I110 Hypertensive heart disease with heart failure: Secondary | ICD-10-CM | POA: Diagnosis not present

## 2019-08-05 DIAGNOSIS — I4891 Unspecified atrial fibrillation: Secondary | ICD-10-CM | POA: Diagnosis not present

## 2019-08-05 DIAGNOSIS — E785 Hyperlipidemia, unspecified: Secondary | ICD-10-CM | POA: Diagnosis not present

## 2019-08-05 MED ORDER — DENOSUMAB 60 MG/ML ~~LOC~~ SOSY
60.0000 mg | PREFILLED_SYRINGE | Freq: Once | SUBCUTANEOUS | Status: AC
  Administered 2019-08-05: 60 mg via SUBCUTANEOUS
  Filled 2019-08-05: qty 1

## 2019-08-05 NOTE — Discharge Instructions (Signed)
Denosumab injection What is this medicine? DENOSUMAB (den oh sue mab) slows bone breakdown. Prolia is used to treat osteoporosis in women after menopause and in men, and in people who are taking corticosteroids for 6 months or more. Xgeva is used to treat a high calcium level due to cancer and to prevent bone fractures and other bone problems caused by multiple myeloma or cancer bone metastases. Xgeva is also used to treat giant cell tumor of the bone. This medicine may be used for other purposes; ask your health care provider or pharmacist if you have questions. COMMON BRAND NAME(S): Prolia, XGEVA What should I tell my health care provider before I take this medicine? They need to know if you have any of these conditions:  dental disease  having surgery or tooth extraction  infection  kidney disease  low levels of calcium or Vitamin D in the blood  malnutrition  on hemodialysis  skin conditions or sensitivity  thyroid or parathyroid disease  an unusual reaction to denosumab, other medicines, foods, dyes, or preservatives  pregnant or trying to get pregnant  breast-feeding How should I use this medicine? This medicine is for injection under the skin. It is given by a health care professional in a hospital or clinic setting. A special MedGuide will be given to you before each treatment. Be sure to read this information carefully each time. For Prolia, talk to your pediatrician regarding the use of this medicine in children. Special care may be needed. For Xgeva, talk to your pediatrician regarding the use of this medicine in children. While this drug may be prescribed for children as young as 13 years for selected conditions, precautions do apply. Overdosage: If you think you have taken too much of this medicine contact a poison control center or emergency room at once. NOTE: This medicine is only for you. Do not share this medicine with others. What if I miss a dose? It is  important not to miss your dose. Call your doctor or health care professional if you are unable to keep an appointment. What may interact with this medicine? Do not take this medicine with any of the following medications:  other medicines containing denosumab This medicine may also interact with the following medications:  medicines that lower your chance of fighting infection  steroid medicines like prednisone or cortisone This list may not describe all possible interactions. Give your health care provider a list of all the medicines, herbs, non-prescription drugs, or dietary supplements you use. Also tell them if you smoke, drink alcohol, or use illegal drugs. Some items may interact with your medicine. What should I watch for while using this medicine? Visit your doctor or health care professional for regular checks on your progress. Your doctor or health care professional may order blood tests and other tests to see how you are doing. Call your doctor or health care professional for advice if you get a fever, chills or sore throat, or other symptoms of a cold or flu. Do not treat yourself. This drug may decrease your body's ability to fight infection. Try to avoid being around people who are sick. You should make sure you get enough calcium and vitamin D while you are taking this medicine, unless your doctor tells you not to. Discuss the foods you eat and the vitamins you take with your health care professional. See your dentist regularly. Brush and floss your teeth as directed. Before you have any dental work done, tell your dentist you are   receiving this medicine. Do not become pregnant while taking this medicine or for 5 months after stopping it. Talk with your doctor or health care professional about your birth control options while taking this medicine. Women should inform their doctor if they wish to become pregnant or think they might be pregnant. There is a potential for serious side  effects to an unborn child. Talk to your health care professional or pharmacist for more information. What side effects may I notice from receiving this medicine? Side effects that you should report to your doctor or health care professional as soon as possible:  allergic reactions like skin rash, itching or hives, swelling of the face, lips, or tongue  bone pain  breathing problems  dizziness  jaw pain, especially after dental work  redness, blistering, peeling of the skin  signs and symptoms of infection like fever or chills; cough; sore throat; pain or trouble passing urine  signs of low calcium like fast heartbeat, muscle cramps or muscle pain; pain, tingling, numbness in the hands or feet; seizures  unusual bleeding or bruising  unusually weak or tired Side effects that usually do not require medical attention (report to your doctor or health care professional if they continue or are bothersome):  constipation  diarrhea  headache  joint pain  loss of appetite  muscle pain  runny nose  tiredness  upset stomach This list may not describe all possible side effects. Call your doctor for medical advice about side effects. You may report side effects to FDA at 1-800-FDA-1088. Where should I keep my medicine? This medicine is only given in a clinic, doctor's office, or other health care setting and will not be stored at home. NOTE: This sheet is a summary. It may not cover all possible information. If you have questions about this medicine, talk to your doctor, pharmacist, or health care provider.  2020 Elsevier/Gold Standard (2017-06-21 16:10:44)

## 2019-08-06 DIAGNOSIS — R0600 Dyspnea, unspecified: Secondary | ICD-10-CM | POA: Diagnosis not present

## 2019-08-10 ENCOUNTER — Other Ambulatory Visit: Payer: Self-pay | Admitting: Psychiatry

## 2019-08-10 DIAGNOSIS — E785 Hyperlipidemia, unspecified: Secondary | ICD-10-CM | POA: Diagnosis not present

## 2019-08-10 DIAGNOSIS — I4891 Unspecified atrial fibrillation: Secondary | ICD-10-CM | POA: Diagnosis not present

## 2019-08-10 DIAGNOSIS — F0390 Unspecified dementia without behavioral disturbance: Secondary | ICD-10-CM | POA: Diagnosis not present

## 2019-08-10 DIAGNOSIS — I110 Hypertensive heart disease with heart failure: Secondary | ICD-10-CM | POA: Diagnosis not present

## 2019-08-10 DIAGNOSIS — M810 Age-related osteoporosis without current pathological fracture: Secondary | ICD-10-CM | POA: Diagnosis not present

## 2019-08-10 DIAGNOSIS — I4892 Unspecified atrial flutter: Secondary | ICD-10-CM | POA: Diagnosis not present

## 2019-08-10 DIAGNOSIS — M47812 Spondylosis without myelopathy or radiculopathy, cervical region: Secondary | ICD-10-CM | POA: Diagnosis not present

## 2019-08-10 DIAGNOSIS — M9701XD Periprosthetic fracture around internal prosthetic right hip joint, subsequent encounter: Secondary | ICD-10-CM | POA: Diagnosis not present

## 2019-08-10 DIAGNOSIS — I509 Heart failure, unspecified: Secondary | ICD-10-CM | POA: Diagnosis not present

## 2019-08-10 NOTE — Telephone Encounter (Signed)
Her last apt is 09/2018 not sure if she needs to be seen. [facility]

## 2019-08-12 DIAGNOSIS — R159 Full incontinence of feces: Secondary | ICD-10-CM | POA: Diagnosis not present

## 2019-08-12 DIAGNOSIS — F411 Generalized anxiety disorder: Secondary | ICD-10-CM | POA: Diagnosis not present

## 2019-08-12 DIAGNOSIS — R32 Unspecified urinary incontinence: Secondary | ICD-10-CM | POA: Diagnosis not present

## 2019-08-12 DIAGNOSIS — F330 Major depressive disorder, recurrent, mild: Secondary | ICD-10-CM | POA: Diagnosis not present

## 2019-08-12 DIAGNOSIS — I10 Essential (primary) hypertension: Secondary | ICD-10-CM | POA: Diagnosis not present

## 2019-08-12 DIAGNOSIS — S72001G Fracture of unspecified part of neck of right femur, subsequent encounter for closed fracture with delayed healing: Secondary | ICD-10-CM | POA: Diagnosis not present

## 2019-08-13 DIAGNOSIS — M9701XD Periprosthetic fracture around internal prosthetic right hip joint, subsequent encounter: Secondary | ICD-10-CM | POA: Diagnosis not present

## 2019-08-13 DIAGNOSIS — I4891 Unspecified atrial fibrillation: Secondary | ICD-10-CM | POA: Diagnosis not present

## 2019-08-13 DIAGNOSIS — M810 Age-related osteoporosis without current pathological fracture: Secondary | ICD-10-CM | POA: Diagnosis not present

## 2019-08-13 DIAGNOSIS — I4892 Unspecified atrial flutter: Secondary | ICD-10-CM | POA: Diagnosis not present

## 2019-08-13 DIAGNOSIS — I110 Hypertensive heart disease with heart failure: Secondary | ICD-10-CM | POA: Diagnosis not present

## 2019-08-13 DIAGNOSIS — F0390 Unspecified dementia without behavioral disturbance: Secondary | ICD-10-CM | POA: Diagnosis not present

## 2019-08-13 DIAGNOSIS — E785 Hyperlipidemia, unspecified: Secondary | ICD-10-CM | POA: Diagnosis not present

## 2019-08-13 DIAGNOSIS — R0600 Dyspnea, unspecified: Secondary | ICD-10-CM | POA: Diagnosis not present

## 2019-08-13 DIAGNOSIS — M47812 Spondylosis without myelopathy or radiculopathy, cervical region: Secondary | ICD-10-CM | POA: Diagnosis not present

## 2019-08-13 DIAGNOSIS — I509 Heart failure, unspecified: Secondary | ICD-10-CM | POA: Diagnosis not present

## 2019-08-16 ENCOUNTER — Telehealth: Payer: Self-pay

## 2019-08-16 NOTE — Telephone Encounter (Signed)
Prior authorization submitted and approved for ZIPRASIONE 20 MG effective 08/11/2019-02/26/2020 with [facility] ID# [id number]

## 2019-08-17 ENCOUNTER — Telehealth: Payer: Self-pay | Admitting: Cardiovascular Disease

## 2019-08-17 NOTE — Telephone Encounter (Signed)
Patient's daughter wants to know if we received any results from her mother's lab work that was done at a mobile lab about a week and a half ago. Please advise.

## 2019-08-17 NOTE — Telephone Encounter (Signed)
Advised daughter no labs received at this time She will follow up on labs done and have them faxed

## 2019-08-18 DIAGNOSIS — E785 Hyperlipidemia, unspecified: Secondary | ICD-10-CM | POA: Diagnosis not present

## 2019-08-18 DIAGNOSIS — F0390 Unspecified dementia without behavioral disturbance: Secondary | ICD-10-CM | POA: Diagnosis not present

## 2019-08-18 DIAGNOSIS — I110 Hypertensive heart disease with heart failure: Secondary | ICD-10-CM | POA: Diagnosis not present

## 2019-08-18 DIAGNOSIS — M9701XD Periprosthetic fracture around internal prosthetic right hip joint, subsequent encounter: Secondary | ICD-10-CM | POA: Diagnosis not present

## 2019-08-18 DIAGNOSIS — I4892 Unspecified atrial flutter: Secondary | ICD-10-CM | POA: Diagnosis not present

## 2019-08-18 DIAGNOSIS — M810 Age-related osteoporosis without current pathological fracture: Secondary | ICD-10-CM | POA: Diagnosis not present

## 2019-08-18 DIAGNOSIS — I4891 Unspecified atrial fibrillation: Secondary | ICD-10-CM | POA: Diagnosis not present

## 2019-08-18 DIAGNOSIS — I509 Heart failure, unspecified: Secondary | ICD-10-CM | POA: Diagnosis not present

## 2019-08-18 DIAGNOSIS — M47812 Spondylosis without myelopathy or radiculopathy, cervical region: Secondary | ICD-10-CM | POA: Diagnosis not present

## 2019-08-19 DIAGNOSIS — R635 Abnormal weight gain: Secondary | ICD-10-CM | POA: Diagnosis not present

## 2019-08-19 DIAGNOSIS — I4892 Unspecified atrial flutter: Secondary | ICD-10-CM | POA: Diagnosis not present

## 2019-08-19 DIAGNOSIS — D539 Nutritional anemia, unspecified: Secondary | ICD-10-CM | POA: Diagnosis not present

## 2019-08-19 DIAGNOSIS — H6123 Impacted cerumen, bilateral: Secondary | ICD-10-CM | POA: Diagnosis not present

## 2019-08-19 DIAGNOSIS — H9190 Unspecified hearing loss, unspecified ear: Secondary | ICD-10-CM | POA: Diagnosis not present

## 2019-08-20 DIAGNOSIS — I509 Heart failure, unspecified: Secondary | ICD-10-CM | POA: Diagnosis not present

## 2019-08-20 DIAGNOSIS — J449 Chronic obstructive pulmonary disease, unspecified: Secondary | ICD-10-CM | POA: Diagnosis not present

## 2019-08-20 DIAGNOSIS — I110 Hypertensive heart disease with heart failure: Secondary | ICD-10-CM | POA: Diagnosis not present

## 2019-08-20 DIAGNOSIS — M9701XD Periprosthetic fracture around internal prosthetic right hip joint, subsequent encounter: Secondary | ICD-10-CM | POA: Diagnosis not present

## 2019-08-20 DIAGNOSIS — R0602 Shortness of breath: Secondary | ICD-10-CM | POA: Diagnosis not present

## 2019-08-25 DIAGNOSIS — I110 Hypertensive heart disease with heart failure: Secondary | ICD-10-CM | POA: Diagnosis not present

## 2019-08-25 DIAGNOSIS — M810 Age-related osteoporosis without current pathological fracture: Secondary | ICD-10-CM | POA: Diagnosis not present

## 2019-08-25 DIAGNOSIS — I4891 Unspecified atrial fibrillation: Secondary | ICD-10-CM | POA: Diagnosis not present

## 2019-08-25 DIAGNOSIS — I509 Heart failure, unspecified: Secondary | ICD-10-CM | POA: Diagnosis not present

## 2019-08-25 DIAGNOSIS — I4892 Unspecified atrial flutter: Secondary | ICD-10-CM | POA: Diagnosis not present

## 2019-08-25 DIAGNOSIS — E785 Hyperlipidemia, unspecified: Secondary | ICD-10-CM | POA: Diagnosis not present

## 2019-08-25 DIAGNOSIS — M9701XD Periprosthetic fracture around internal prosthetic right hip joint, subsequent encounter: Secondary | ICD-10-CM | POA: Diagnosis not present

## 2019-08-25 DIAGNOSIS — F0390 Unspecified dementia without behavioral disturbance: Secondary | ICD-10-CM | POA: Diagnosis not present

## 2019-08-25 DIAGNOSIS — M47812 Spondylosis without myelopathy or radiculopathy, cervical region: Secondary | ICD-10-CM | POA: Diagnosis not present

## 2019-09-01 DIAGNOSIS — M9701XD Periprosthetic fracture around internal prosthetic right hip joint, subsequent encounter: Secondary | ICD-10-CM | POA: Diagnosis not present

## 2019-09-01 DIAGNOSIS — M47812 Spondylosis without myelopathy or radiculopathy, cervical region: Secondary | ICD-10-CM | POA: Diagnosis not present

## 2019-09-01 DIAGNOSIS — I4892 Unspecified atrial flutter: Secondary | ICD-10-CM | POA: Diagnosis not present

## 2019-09-01 DIAGNOSIS — M810 Age-related osteoporosis without current pathological fracture: Secondary | ICD-10-CM | POA: Diagnosis not present

## 2019-09-01 DIAGNOSIS — E785 Hyperlipidemia, unspecified: Secondary | ICD-10-CM | POA: Diagnosis not present

## 2019-09-01 DIAGNOSIS — I110 Hypertensive heart disease with heart failure: Secondary | ICD-10-CM | POA: Diagnosis not present

## 2019-09-01 DIAGNOSIS — I4891 Unspecified atrial fibrillation: Secondary | ICD-10-CM | POA: Diagnosis not present

## 2019-09-01 DIAGNOSIS — I509 Heart failure, unspecified: Secondary | ICD-10-CM | POA: Diagnosis not present

## 2019-09-01 DIAGNOSIS — F0390 Unspecified dementia without behavioral disturbance: Secondary | ICD-10-CM | POA: Diagnosis not present

## 2019-09-03 ENCOUNTER — Telehealth: Payer: Self-pay | Admitting: Cardiovascular Disease

## 2019-09-03 NOTE — Telephone Encounter (Signed)
Patient's daughter is calling to find out to the lab results have been received and if Dr. Duke Salvia wants to make any medication changes.  She said the labs were done on 6/30 from the mobile lab.

## 2019-09-03 NOTE — Telephone Encounter (Signed)
Called daughter- she states that the mobile lab should have faxed over the information last week.  Patient daughter just wants to know either [place] if any changes or not.  I will route to primary nurse to make her aware and to check if labs were received.

## 2019-09-07 DIAGNOSIS — I110 Hypertensive heart disease with heart failure: Secondary | ICD-10-CM | POA: Diagnosis not present

## 2019-09-07 DIAGNOSIS — I509 Heart failure, unspecified: Secondary | ICD-10-CM | POA: Diagnosis not present

## 2019-09-07 DIAGNOSIS — M810 Age-related osteoporosis without current pathological fracture: Secondary | ICD-10-CM | POA: Diagnosis not present

## 2019-09-07 DIAGNOSIS — I4892 Unspecified atrial flutter: Secondary | ICD-10-CM | POA: Diagnosis not present

## 2019-09-07 DIAGNOSIS — F0390 Unspecified dementia without behavioral disturbance: Secondary | ICD-10-CM | POA: Diagnosis not present

## 2019-09-07 DIAGNOSIS — M9701XD Periprosthetic fracture around internal prosthetic right hip joint, subsequent encounter: Secondary | ICD-10-CM | POA: Diagnosis not present

## 2019-09-07 DIAGNOSIS — I4891 Unspecified atrial fibrillation: Secondary | ICD-10-CM | POA: Diagnosis not present

## 2019-09-07 DIAGNOSIS — E785 Hyperlipidemia, unspecified: Secondary | ICD-10-CM | POA: Diagnosis not present

## 2019-09-07 DIAGNOSIS — M47812 Spondylosis without myelopathy or radiculopathy, cervical region: Secondary | ICD-10-CM | POA: Diagnosis not present

## 2019-09-09 DIAGNOSIS — R2681 Unsteadiness on feet: Secondary | ICD-10-CM | POA: Diagnosis not present

## 2019-09-09 DIAGNOSIS — R635 Abnormal weight gain: Secondary | ICD-10-CM | POA: Diagnosis not present

## 2019-09-09 DIAGNOSIS — S72001D Fracture of unspecified part of neck of right femur, subsequent encounter for closed fracture with routine healing: Secondary | ICD-10-CM | POA: Diagnosis not present

## 2019-09-09 DIAGNOSIS — J449 Chronic obstructive pulmonary disease, unspecified: Secondary | ICD-10-CM | POA: Diagnosis not present

## 2019-09-10 ENCOUNTER — Emergency Department (HOSPITAL_COMMUNITY): Payer: Medicare PPO

## 2019-09-10 ENCOUNTER — Encounter (HOSPITAL_COMMUNITY): Payer: Self-pay

## 2019-09-10 ENCOUNTER — Inpatient Hospital Stay (HOSPITAL_COMMUNITY)
Admission: EM | Admit: 2019-09-10 | Discharge: 2019-09-15 | DRG: 291 | Disposition: A | Discharge status: Skilled Nursing Facility | Payer: Medicare PPO | Attending: Internal Medicine | Admitting: Internal Medicine

## 2019-09-10 ENCOUNTER — Other Ambulatory Visit: Payer: Self-pay

## 2019-09-10 ENCOUNTER — Other Ambulatory Visit: Payer: Self-pay | Admitting: Psychiatry

## 2019-09-10 DIAGNOSIS — I5082 Biventricular heart failure: Secondary | ICD-10-CM | POA: Diagnosis present

## 2019-09-10 DIAGNOSIS — I4819 Other persistent atrial fibrillation: Secondary | ICD-10-CM | POA: Diagnosis present

## 2019-09-10 DIAGNOSIS — I5033 Acute on chronic diastolic (congestive) heart failure: Secondary | ICD-10-CM | POA: Diagnosis present

## 2019-09-10 DIAGNOSIS — J449 Chronic obstructive pulmonary disease, unspecified: Secondary | ICD-10-CM | POA: Diagnosis present

## 2019-09-10 DIAGNOSIS — I428 Other cardiomyopathies: Secondary | ICD-10-CM | POA: Diagnosis present

## 2019-09-10 DIAGNOSIS — D649 Anemia, unspecified: Secondary | ICD-10-CM | POA: Diagnosis present

## 2019-09-10 DIAGNOSIS — I5043 Acute on chronic combined systolic (congestive) and diastolic (congestive) heart failure: Secondary | ICD-10-CM | POA: Diagnosis not present

## 2019-09-10 DIAGNOSIS — F0390 Unspecified dementia without behavioral disturbance: Secondary | ICD-10-CM | POA: Diagnosis present

## 2019-09-10 DIAGNOSIS — F329 Major depressive disorder, single episode, unspecified: Secondary | ICD-10-CM | POA: Diagnosis present

## 2019-09-10 DIAGNOSIS — F419 Anxiety disorder, unspecified: Secondary | ICD-10-CM | POA: Diagnosis present

## 2019-09-10 DIAGNOSIS — I4891 Unspecified atrial fibrillation: Secondary | ICD-10-CM | POA: Diagnosis present

## 2019-09-10 DIAGNOSIS — Z90710 Acquired absence of both cervix and uterus: Secondary | ICD-10-CM

## 2019-09-10 DIAGNOSIS — M9701XD Periprosthetic fracture around internal prosthetic right hip joint, subsequent encounter: Secondary | ICD-10-CM | POA: Diagnosis not present

## 2019-09-10 DIAGNOSIS — R000 Tachycardia, unspecified: Secondary | ICD-10-CM | POA: Diagnosis not present

## 2019-09-10 DIAGNOSIS — Z79899 Other long term (current) drug therapy: Secondary | ICD-10-CM

## 2019-09-10 DIAGNOSIS — F0150 Vascular dementia without behavioral disturbance: Secondary | ICD-10-CM | POA: Diagnosis not present

## 2019-09-10 DIAGNOSIS — J9811 Atelectasis: Secondary | ICD-10-CM | POA: Diagnosis not present

## 2019-09-10 DIAGNOSIS — R069 Unspecified abnormalities of breathing: Secondary | ICD-10-CM | POA: Diagnosis not present

## 2019-09-10 DIAGNOSIS — I509 Heart failure, unspecified: Secondary | ICD-10-CM

## 2019-09-10 DIAGNOSIS — F259 Schizoaffective disorder, unspecified: Secondary | ICD-10-CM | POA: Diagnosis present

## 2019-09-10 DIAGNOSIS — R296 Repeated falls: Secondary | ICD-10-CM | POA: Diagnosis present

## 2019-09-10 DIAGNOSIS — Z8673 Personal history of transient ischemic attack (TIA), and cerebral infarction without residual deficits: Secondary | ICD-10-CM

## 2019-09-10 DIAGNOSIS — Z823 Family history of stroke: Secondary | ICD-10-CM

## 2019-09-10 DIAGNOSIS — N179 Acute kidney failure, unspecified: Secondary | ICD-10-CM | POA: Diagnosis present

## 2019-09-10 DIAGNOSIS — Z96643 Presence of artificial hip joint, bilateral: Secondary | ICD-10-CM | POA: Diagnosis present

## 2019-09-10 DIAGNOSIS — M2550 Pain in unspecified joint: Secondary | ICD-10-CM | POA: Diagnosis not present

## 2019-09-10 DIAGNOSIS — I110 Hypertensive heart disease with heart failure: Principal | ICD-10-CM | POA: Diagnosis present

## 2019-09-10 DIAGNOSIS — J9601 Acute respiratory failure with hypoxia: Secondary | ICD-10-CM | POA: Diagnosis present

## 2019-09-10 DIAGNOSIS — I5032 Chronic diastolic (congestive) heart failure: Secondary | ICD-10-CM | POA: Diagnosis present

## 2019-09-10 DIAGNOSIS — L89151 Pressure ulcer of sacral region, stage 1: Secondary | ICD-10-CM | POA: Diagnosis present

## 2019-09-10 DIAGNOSIS — F29 Unspecified psychosis not due to a substance or known physiological condition: Secondary | ICD-10-CM | POA: Diagnosis not present

## 2019-09-10 DIAGNOSIS — E440 Moderate protein-calorie malnutrition: Secondary | ICD-10-CM | POA: Diagnosis present

## 2019-09-10 DIAGNOSIS — Z66 Do not resuscitate: Secondary | ICD-10-CM | POA: Diagnosis present

## 2019-09-10 DIAGNOSIS — R0902 Hypoxemia: Secondary | ICD-10-CM | POA: Diagnosis not present

## 2019-09-10 DIAGNOSIS — Z888 Allergy status to other drugs, medicaments and biological substances status: Secondary | ICD-10-CM

## 2019-09-10 DIAGNOSIS — Z20822 Contact with and (suspected) exposure to covid-19: Secondary | ICD-10-CM | POA: Diagnosis present

## 2019-09-10 DIAGNOSIS — I480 Paroxysmal atrial fibrillation: Secondary | ICD-10-CM | POA: Diagnosis not present

## 2019-09-10 DIAGNOSIS — Z7401 Bed confinement status: Secondary | ICD-10-CM | POA: Diagnosis not present

## 2019-09-10 DIAGNOSIS — J439 Emphysema, unspecified: Secondary | ICD-10-CM | POA: Diagnosis not present

## 2019-09-10 DIAGNOSIS — Z7901 Long term (current) use of anticoagulants: Secondary | ICD-10-CM

## 2019-09-10 DIAGNOSIS — E785 Hyperlipidemia, unspecified: Secondary | ICD-10-CM | POA: Diagnosis present

## 2019-09-10 DIAGNOSIS — F411 Generalized anxiety disorder: Secondary | ICD-10-CM | POA: Diagnosis present

## 2019-09-10 DIAGNOSIS — R0602 Shortness of breath: Secondary | ICD-10-CM | POA: Diagnosis not present

## 2019-09-10 DIAGNOSIS — Z681 Body mass index (BMI) 19 or less, adult: Secondary | ICD-10-CM

## 2019-09-10 DIAGNOSIS — R4182 Altered mental status, unspecified: Secondary | ICD-10-CM | POA: Diagnosis not present

## 2019-09-10 DIAGNOSIS — R946 Abnormal results of thyroid function studies: Secondary | ICD-10-CM | POA: Diagnosis present

## 2019-09-10 DIAGNOSIS — R410 Disorientation, unspecified: Secondary | ICD-10-CM | POA: Diagnosis not present

## 2019-09-10 DIAGNOSIS — M247 Protrusio acetabuli: Secondary | ICD-10-CM | POA: Diagnosis not present

## 2019-09-10 DIAGNOSIS — I700 Atherosclerosis of aorta: Secondary | ICD-10-CM | POA: Diagnosis not present

## 2019-09-10 DIAGNOSIS — J90 Pleural effusion, not elsewhere classified: Secondary | ICD-10-CM | POA: Diagnosis not present

## 2019-09-10 DIAGNOSIS — S7291XD Unspecified fracture of right femur, subsequent encounter for closed fracture with routine healing: Secondary | ICD-10-CM | POA: Diagnosis not present

## 2019-09-10 DIAGNOSIS — Z8249 Family history of ischemic heart disease and other diseases of the circulatory system: Secondary | ICD-10-CM

## 2019-09-10 HISTORY — DX: Chronic obstructive pulmonary disease, unspecified: J44.9

## 2019-09-10 HISTORY — DX: Chronic diastolic (congestive) heart failure: I50.32

## 2019-09-10 HISTORY — DX: Fracture of unspecified part of neck of unspecified femur, initial encounter for closed fracture: S72.009A

## 2019-09-10 HISTORY — DX: Anemia, unspecified: D64.9

## 2019-09-10 HISTORY — DX: Unspecified dementia, unspecified severity, without behavioral disturbance, psychotic disturbance, mood disturbance, and anxiety: F03.90

## 2019-09-10 HISTORY — DX: Repeated falls: R29.6

## 2019-09-10 HISTORY — DX: Unspecified fall, initial encounter: W19.XXXA

## 2019-09-10 LAB — COMPREHENSIVE METABOLIC PANEL
ALT: 12 U/L (ref 0–44)
AST: 22 U/L (ref 15–41)
Albumin: 3.8 g/dL (ref 3.5–5.0)
Alkaline Phosphatase: 75 U/L (ref 38–126)
Anion gap: 11 (ref 5–15)
BUN: 22 mg/dL (ref 8–23)
CO2: 26 mmol/L (ref 22–32)
Calcium: 9.2 mg/dL (ref 8.9–10.3)
Chloride: 102 mmol/L (ref 98–111)
Creatinine, Ser: 0.81 mg/dL (ref 0.44–1.00)
GFR calc Af Amer: >60 mL/min (ref >60)
GFR calc non Af Amer: >60 mL/min (ref >60)
Glucose, Bld: 97 mg/dL (ref 70–99)
Potassium: 4.2 mmol/L (ref 3.5–5.1)
Sodium: 139 mmol/L (ref 135–145)
Total Bilirubin: 0.8 mg/dL (ref 0.3–1.2)
Total Protein: 7.7 g/dL (ref 6.5–8.1)

## 2019-09-10 LAB — CBC WITH DIFFERENTIAL/PLATELET
Abs Immature Granulocytes: 0.04 10*3/uL (ref 0.00–0.07)
Basophils Absolute: 0.1 10*3/uL (ref 0.0–0.1)
Basophils Relative: 1 %
Eosinophils Absolute: 0.1 10*3/uL (ref 0.0–0.5)
Eosinophils Relative: 1 %
HCT: 38.1 % (ref 36.0–46.0)
Hemoglobin: 12.3 g/dL (ref 12.0–15.0)
Immature Granulocytes: 0 %
Lymphocytes Relative: 31 %
Lymphs Abs: 2.9 10*3/uL (ref 0.7–4.0)
MCH: 31.5 pg (ref 26.0–34.0)
MCHC: 32.3 g/dL (ref 30.0–36.0)
MCV: 97.7 fL (ref 80.0–100.0)
Monocytes Absolute: 0.9 10*3/uL (ref 0.1–1.0)
Monocytes Relative: 10 %
Neutro Abs: 5.2 10*3/uL (ref 1.7–7.7)
Neutrophils Relative %: 57 %
Platelets: 259 10*3/uL (ref 150–400)
RBC: 3.9 MIL/uL (ref 3.87–5.11)
RDW: 15.8 % — ABNORMAL HIGH (ref 11.5–15.5)
WBC: 9.2 10*3/uL (ref 4.0–10.5)
nRBC: 0 % (ref 0.0–0.2)

## 2019-09-10 LAB — URINALYSIS, ROUTINE W REFLEX MICROSCOPIC
Bilirubin Urine: NEGATIVE
Glucose, UA: NEGATIVE mg/dL
Ketones, ur: NEGATIVE mg/dL
Nitrite: NEGATIVE
Protein, ur: NEGATIVE mg/dL
Specific Gravity, Urine: 1.014 (ref 1.005–1.030)
pH: 7 (ref 5.0–8.0)

## 2019-09-10 LAB — LACTIC ACID, PLASMA: Lactic Acid, Venous: 1.8 mmol/L (ref 0.5–1.9)

## 2019-09-10 LAB — TROPONIN I (HIGH SENSITIVITY)
Troponin I (High Sensitivity): 10 ng/L (ref <18)
Troponin I (High Sensitivity): 5 ng/L (ref <18)

## 2019-09-10 LAB — D-DIMER, QUANTITATIVE: D-Dimer, Quant: 1.89 ug/mL-FEU — ABNORMAL HIGH (ref 0.00–0.50)

## 2019-09-10 LAB — SARS CORONAVIRUS 2 BY RT PCR (HOSPITAL ORDER, PERFORMED IN ~~LOC~~ HOSPITAL LAB): SARS Coronavirus 2: NEGATIVE

## 2019-09-10 LAB — BRAIN NATRIURETIC PEPTIDE: B Natriuretic Peptide: 596.7 pg/mL — ABNORMAL HIGH (ref 0.0–100.0)

## 2019-09-10 MED ORDER — APIXABAN 2.5 MG PO TABS
2.5000 mg | ORAL_TABLET | Freq: Once | ORAL | Status: AC
  Administered 2019-09-10: 2.5 mg via ORAL
  Filled 2019-09-10: qty 1

## 2019-09-10 MED ORDER — SIMVASTATIN 10 MG PO TABS
10.0000 mg | ORAL_TABLET | Freq: Once | ORAL | Status: AC
  Administered 2019-09-10: 10 mg via ORAL
  Filled 2019-09-10: qty 1

## 2019-09-10 MED ORDER — SODIUM CHLORIDE (PF) 0.9 % IJ SOLN
INTRAMUSCULAR | Status: AC
  Filled 2019-09-10: qty 50

## 2019-09-10 MED ORDER — IOHEXOL 350 MG/ML SOLN
100.0000 mL | Freq: Once | INTRAVENOUS | Status: AC | PRN
  Administered 2019-09-10: 100 mL via INTRAVENOUS

## 2019-09-10 MED ORDER — METOPROLOL TARTRATE 25 MG PO TABS
25.0000 mg | ORAL_TABLET | Freq: Once | ORAL | Status: AC
  Administered 2019-09-10: 25 mg via ORAL
  Filled 2019-09-10: qty 1

## 2019-09-10 MED ORDER — METOPROLOL TARTRATE 5 MG/5ML IV SOLN
2.5000 mg | Freq: Once | INTRAVENOUS | Status: AC
  Administered 2019-09-10: 2.5 mg via INTRAVENOUS
  Filled 2019-09-10: qty 5

## 2019-09-10 MED ORDER — FUROSEMIDE 10 MG/ML IJ SOLN
20.0000 mg | Freq: Once | INTRAMUSCULAR | Status: AC
  Administered 2019-09-10: 20 mg via INTRAVENOUS
  Filled 2019-09-10: qty 4

## 2019-09-10 MED ORDER — LORAZEPAM 0.5 MG PO TABS
0.5000 mg | ORAL_TABLET | Freq: Once | ORAL | Status: AC
  Administered 2019-09-10: 0.5 mg via ORAL
  Filled 2019-09-10: qty 1

## 2019-09-10 NOTE — ED Provider Notes (Signed)
[facility]-EMERGENCY DEPT Provider Note   CSN: [id number] Arrival date & time: 09/10/19  1643     History Chief Complaint  Patient presents with  . Shortness of Breath    Yolanda Baker is a 86 y.o. female.  HPI 86 year old female presents with shortness of breath.  She states she has been feeling somewhat short of breath for a while but got worse today.  EMS was called from [facility] because of acute dyspnea.  They gave her a nebulizer treatment without much relief.  When EMS got there, O2 sats were around 65% on room air, she does not normally wear oxygen.  They placed her on 10 L nonrebreather and her sats are now 99%.  Patient states she is feeling better after the treatment and oxygen.  There is no cough, chest pain, vomiting, leg swelling, abdominal pain.  She is wearing a brace from her hip surgery from a few months ago.  She takes Eliquis for her A. Fib. She has been vaccinated for covid.  Past Medical History:  Diagnosis Date  . Anxiety   . Arthritis    neck  . Bruises easily   . COPD (chronic obstructive pulmonary disease) (HCC)   . Depression   . Dysrhythmia    hx A-FLUTTER after hip surg April 2015- take metoprolol  . Eczema   . Hip joint pain    RT  . Hypertension   . Memory loss   . Osteoporosis   . PAF (paroxysmal atrial fibrillation) (HCC) 08/04/2019  . Pneumonia    august 2017  . Shortness of breath    with activity  . Stroke [facility])    "mini stroke" per MRI - no deficiets     Patient Active Problem List   Diagnosis Date Noted  . PAF (paroxysmal atrial fibrillation) (HCC) 08/04/2019  . Goals of care, counseling/discussion   . Palliative care by specialist   . Pressure injury of skin 05/07/2019  . Periprosthetic fracture around internal prosthetic hip joint 05/06/2019  . DNR (do not resuscitate) present on admission 02/04/2019  . Acute on chronic diastolic heart failure (HCC)   . GAD (generalized anxiety disorder) 01/15/2018    . Schizoaffective disorder (HCC) 01/15/2018  . Hyperlipidemia LDL goal <70 07/01/2014  . Constipation chronic 07/01/2014  . Acute CVA (cerebrovascular accident) (HCC)   . Acute blood loss anemia 10/12/2013  . Postoperative anemia due to acute blood loss 10/04/2013  . Postop Transfusion 10/04/2013  . Closed fracture of femur with nonunion 10/03/2013  . Femur fracture, right (HCC) 10/03/2013  . Closed femur fracture (HCC) 10/03/2013  . UTI (urinary tract infection) 06/23/2013  . Dementia (HCC) 06/09/2013  . Anxiety 06/09/2013  . Atrial flutter (HCC) 06/07/2013  . Hip fracture (HCC) 06/04/2013  . Depression 06/04/2013  . Anxiety state 06/04/2013  . Essential hypertension 06/04/2013  . Closed right hip fracture (HCC) 06/04/2013    Past Surgical History:  Procedure Laterality Date  . ABDOMINAL HYSTERECTOMY    . FRACTURE SURGERY Right 06/05/2013   hip  . HIP ARTHROPLASTY Right 10/03/2013   Procedure: CONVERSION OF PREVIOUS RIGHT HIP SURGERY TO A RIGHT TOTAL HIP ARTHROPLASTY;  Surgeon: Loanne Drilling, MD;  Location: [facility] ORS;  Service: Orthopedics;  Laterality: Right;  . INTRAMEDULLARY (IM) NAIL INTERTROCHANTERIC Right 06/05/2013   Procedure: INTRAMEDULLARY (IM) NAIL INTERTROCHANTRIC;  Surgeon: Loanne Drilling, MD;  Location: [facility] ORS;  Service: Orthopedics;  Laterality: Right;  . JOINT REPLACEMENT     left  hip  . left hip Left 2008  . LEG SURGERY Right    x2     OB History   No obstetric history on file.     Family History  Problem Relation Age of Onset  . Heart attack Father   . Hypertension Father   . Hypertension Sister   . Stroke Sister   . Hypertension Brother     Social History   Tobacco Use  . Smoking status: Never Smoker  . Smokeless tobacco: Never Used  Vaping Use  . Vaping Use: Never used  Substance Use Topics  . Alcohol use: No    Alcohol/week: 0.0 standard drinks  . Drug use: No    Home Medications Prior to Admission medications   Medication Sig  Start Date End Date Taking? Authorizing Provider  acetaminophen (TYLENOL) 500 MG tablet Take 1 tablet (500 mg total) by mouth 2 (two) times daily as needed for mild pain or moderate pain. 03/02/15   Leta Baptist, MD  amLODipine (NORVASC) 5 MG tablet Take 5 mg by mouth daily.    [provider]  apixaban (ELIQUIS) 2.5 MG TABS tablet Take 1 tablet (2.5 mg total) by mouth 2 (two) times daily. 07/01/14   Layne Benton, NP  chlorhexidine (PERIDEX) 0.12 % solution Use as directed 15 mLs in the mouth or throat 2 (two) times daily. Swish and spit    [provider]  Chlorpheniramine-APAP (CORICIDIN) 2-325 MG TABS Take 1 tablet by mouth 2 (two) times daily as needed (congestion).    [provider]  citalopram (CELEXA) 10 MG tablet Take 20 mg by mouth daily.  01/03/15   [provider]  citalopram (CELEXA) 20 MG tablet TAKE ONE TABLET BY MOUTH DAILY **NO REFILLS** **COURTESY FILL** 08/10/19 08/10/19   Cottle, Steva Ready., MD  furosemide (LASIX) 20 MG tablet Take 1 tablet (20 mg total) by mouth every Monday, Wednesday, and Friday. 02/09/19   Albertine Grates, MD  galantamine (RAZADYNE ER) 8 MG 24 hr capsule Take 8 mg by mouth daily with breakfast.    [provider]  HYDROcodone-acetaminophen (NORCO/VICODIN) 5-325 MG tablet Take 1-2 tablets by mouth every 6 (six) hours as needed for moderate pain. 05/10/19   Pokhrel, Rebekah Chesterfield, MD  ipratropium-albuterol (DUONEB) 0.5-2.5 (3) MG/3ML SOLN Administer by nebulizer every 6 hours as needed for shortness of breath. Patient not taking: Reported on 08/04/2019 05/14/19   Molpus, John, MD  loperamide (IMODIUM) 2 MG capsule Take 2 mg by mouth as needed. 07/14/19   [provider]  LORazepam (ATIVAN) 0.5 MG tablet Take 1 tablet (0.5 mg total) by mouth 2 (two) times daily. Patient taking differently: Take 0.5 mg by mouth daily.  02/04/19   Swayze, Ava, DO  methocarbamol (ROBAXIN) 500 MG tablet Take 1 tablet (500 mg total) by mouth every 6  (six) hours as needed for muscle spasms. Patient not taking: Reported on 08/04/2019 05/10/19   Joycelyn Das, MD  metoprolol tartrate (LOPRESSOR) 100 MG tablet Take 25 mg by mouth 2 (two) times daily.  05/04/19   [provider]  Multiple Vitamins-Minerals (CERTAVITE SENIOR/ANTIOXIDANT PO) Take 1 tablet by mouth daily.     [provider]  Polyethyl Glycol-Propyl Glycol (SYSTANE) 0.4-0.3 % SOLN Place 1 drop into both eyes every hour as needed (dry eyes/irritation).    [provider]  potassium chloride SA (K-DUR,KLOR-CON) 20 MEQ tablet Take 20 mEq by mouth every other day.    [provider]  psyllium (REGULOID)  0.52 g capsule Take 0.52 g by mouth daily.    [provider]  saline (AYR) GEL Place 1 application into both nostrils at bedtime.    [provider]  simvastatin (ZOCOR) 10 MG tablet Take 1 tablet (10 mg total) by mouth daily at 6 PM. 07/01/14   Layne Benton, NP  sodium chloride (OCEAN) 0.65 % SOLN nasal spray Place 3 sprays into both nostrils 4 (four) times daily.    [provider]  STIOLTO RESPIMAT 2.5-2.5 MCG/ACT AERS Inhale 2 puffs into the lungs daily. 04/17/19   [provider]  ziprasidone (GEODON) 20 MG capsule TAKE ONE CAPSULE BY MOUTH DAILY IN THE AFTERNOON **DO NOT CRUSH** **NO REFILLS** 09/10/19   Cottle, Steva Ready., MD  ziprasidone (GEODON) 60 MG capsule Take 40 mg by mouth daily. 40mg  in the afternoon 06/08/15   [provider]    Allergies    Flagyl [metronidazole]  Review of Systems   Review of Systems  Constitutional: Negative for fever.  Respiratory: Positive for shortness of breath. Negative for cough.   Cardiovascular: Negative for chest pain and leg swelling.  Gastrointestinal: Negative for abdominal pain and vomiting.  All other systems reviewed and are negative.   Physical Exam Updated Vital Signs BP (!) 156/85   Pulse 97   Temp 98.7 F (37.1 C) (Oral)   Resp (!) 22   SpO2  99%   Physical Exam Vitals and nursing note reviewed.  Constitutional:      General: She is not in acute distress.    Appearance: She is well-developed. She is not ill-appearing or diaphoretic.  HENT:     Head: Normocephalic and atraumatic.     Right Ear: External ear normal.     Left Ear: External ear normal.     Nose: Nose normal.  Eyes:     General:        Right eye: No discharge.        Left eye: No discharge.  Cardiovascular:     Rate and Rhythm: Tachycardia present. Rhythm irregular.     Heart sounds: Normal heart sounds.     Comments: HR low 100s Pulmonary:     Effort: Pulmonary effort is normal. No tachypnea, accessory muscle usage or respiratory distress.     Breath sounds: Normal breath sounds. No wheezing, rhonchi or rales.  Abdominal:     Palpations: Abdomen is soft.     Tenderness: There is no abdominal tenderness.  Musculoskeletal:     Right lower leg: No edema.     Left lower leg: No edema.  Skin:    General: Skin is warm and dry.  Neurological:     Mental Status: She is alert.  Psychiatric:        Mood and Affect: Mood is not anxious.     ED Results / Procedures / Treatments   Labs (all labs ordered are listed, but only abnormal results are displayed) Labs Reviewed  BRAIN NATRIURETIC PEPTIDE - Abnormal; Notable for the following components:      Result Value   B Natriuretic Peptide 596.7 (*)    All other components within normal limits  CBC WITH DIFFERENTIAL/PLATELET - Abnormal; Notable for the following components:   RDW 15.8 (*)    All other components within normal limits  URINALYSIS, ROUTINE W REFLEX MICROSCOPIC - Abnormal; Notable for the following components:   APPearance CLOUDY (*)    Hgb urine dipstick MODERATE (*)    Leukocytes,Ua SMALL (*)  Bacteria, UA MANY (*)    All other components within normal limits  D-DIMER, QUANTITATIVE (NOT AT [facility]) - Abnormal; Notable for the following components:   D-Dimer, Quant 1.89 (*)    All other  components within normal limits  SARS CORONAVIRUS 2 BY RT PCR (HOSPITAL ORDER, PERFORMED IN [facility])  CULTURE, BLOOD (ROUTINE X 2)  CULTURE, BLOOD (ROUTINE X 2)  COMPREHENSIVE METABOLIC PANEL  LACTIC ACID, PLASMA  CBG MONITORING, ED  TROPONIN I (HIGH SENSITIVITY)  TROPONIN I (HIGH SENSITIVITY)    EKG ED ECG REPORT   Date: 09/10/2019  Rate: 100  Rhythm: atrial fibrillation  QRS Axis: normal  Intervals: normal  ST/T Wave abnormalities: nonspecific T wave changes  Conduction Disutrbances:none  Narrative Interpretation:   Old EKG Reviewed: unchanged  I have personally reviewed the EKG tracing and agree with the computerized printout as noted.  Radiology CT Angio Chest PE W and/or Wo Contrast  Result Date: 09/10/2019 CLINICAL DATA:  86 year old female with COPD.  Hypoxia. EXAM: CT ANGIOGRAPHY CHEST WITH CONTRAST TECHNIQUE: Multidetector CT imaging of the chest was performed using the standard protocol during bolus administration of intravenous contrast. Multiplanar CT image reconstructions and MIPs were obtained to evaluate the vascular anatomy. CONTRAST:  OMNIPAQUE IOHEXOL 350 MG/ML SOLN COMPARISON:  CTA chest 02/03/2019. FINDINGS: Cardiovascular: Good contrast bolus timing in the pulmonary arterial tree. Mild respiratory motion, most pronounced at the lung bases. No focal filling defect identified in the pulmonary arteries to suggest acute pulmonary embolism. Stable cardiomegaly. No pericardial effusion. Calcified aortic atherosclerosis. Mediastinum/Nodes: Negative.  No mediastinal lymphadenopathy. Lungs/Pleura: Small bilateral layering pleural effusions. Enhancing compressive atelectasis at both lung bases. Lung volumes are not significantly changed compared to December, and the major airways remain patent. No pneumonia or other acute pulmonary opacity. Upper Abdomen: Contrast reflux into the hepatic IVC and hepatic veins. Otherwise negative visible liver. Negative  visible spleen, adrenal glands, left kidney and bowel. Musculoskeletal: No acute osseous abnormality identified. Review of the MIP images confirms the above findings. IMPRESSION: 1. Negative for acute pulmonary embolus. 2. Small layering pleural effusions with compressive atelectasis at both lung bases. But no other acute pulmonary process. 3. Stable cardiomegaly. Contrast reflux into the hepatic veins and hepatic veins compatible with a degree of right heart failure. 4. Aortic Atherosclerosis (ICD10-I70.0). Electronically Signed   By: Odessa Fleming M.D.   On: 09/10/2019 21:00   DG Chest Portable 1 View  Result Date: 09/10/2019 CLINICAL DATA:  Acute dyspnea, hypoxia EXAM: PORTABLE CHEST 1 VIEW COMPARISON:  05/14/2019 FINDINGS: Single frontal view of the chest demonstrates persistent enlargement the cardiac silhouette. Stable ectasia of the thoracic aorta. There is background emphysema and hyperinflation. Minimal residual retrocardiac consolidation and trace left pleural effusion remain, markedly improved since previous study. Right chest is clear. No pneumothorax. IMPRESSION: 1. Minimal residual left basilar consolidation and effusion since prior study. 2. Stable emphysema. Electronically Signed   By: Sharlet Salina M.D.   On: 09/10/2019 19:12   DG Femur Portable Min 2 Views Right  Result Date: 09/10/2019 CLINICAL DATA:  Chronic right femoral fracture. Assess interval healing. EXAM: RIGHT FEMUR PORTABLE 2 VIEW COMPARISON:  05/06/2019 FINDINGS: Surgical changes right total hip arthroplasty are again identified. An oblique fracture of the proximal femoral diaphysis surrounding the distal femoral stem component is again identified and now demonstrates bridging callus between the fracture fragments in keeping with interval healing. There is residual deformity with minimal posterior angulation and approximately 1/2 shaft with lateral displacement  of the distal femoral shaft. Extensive heterotopic ossification  surrounding the femoral stem component of the arthroplasty involving the proximal femur in the region of the right hip is again identified. Marked protrusio is also, again, identified with 1 of the acetabular screws extending into the medial pelvis, unchanged from prior examination. Advanced vascular calcifications are seen within the soft tissues of the right lower extremity. IMPRESSION: Interval healing now with bridging callus between the fracture fragments. Mild residual deformity. 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 Electronically Signed   By: Helyn Numbers MD   On: 09/10/2019 20:11    Procedures .Critical Care Performed by: Pricilla Loveless, MD Authorized by: Pricilla Loveless, MD   Critical care provider statement:    Critical care time (minutes):  35   Critical care time was exclusive of:  Separately billable procedures and treating other patients   Critical care was necessary to treat or prevent imminent or life-threatening deterioration of the following conditions:  Cardiac failure   Critical care was time spent personally by me on the following activities:  Discussions with consultants, evaluation of patient's response to treatment, examination of patient, ordering and performing treatments and interventions, ordering and review of laboratory studies, ordering and review of radiographic studies, pulse oximetry, re-evaluation of patient's condition, obtaining history from patient or surrogate and review of old charts   (including critical care time)  Medications Ordered in ED Medications  sodium chloride (PF) 0.9 % injection (has no administration in time range)  metoprolol tartrate (LOPRESSOR) tablet 25 mg (has no administration in time range)  simvastatin (ZOCOR) tablet 10 mg (has no administration in time range)  apixaban (ELIQUIS) tablet 2.5 mg (has no administration in time range)  LORazepam (ATIVAN) tablet 0.5 mg (has no administration in time  range)  furosemide (LASIX) injection 20 mg (20 mg Intravenous Given 09/10/19 2005)  iohexol (OMNIPAQUE) 350 MG/ML injection 100 mL (100 mLs Intravenous Contrast Given 09/10/19 2034)  metoprolol tartrate (LOPRESSOR) injection 2.5 mg (2.5 mg Intravenous Given 09/10/19 2212)    ED Course  I have reviewed the triage vital signs and the nursing notes.  Pertinent labs & imaging results that were available during my care of the patient were reviewed by me and considered in my medical decision making (see chart for details).    MDM Rules/Calculators/A&P                          Patient's dyspnea is probably some mildly decompensated acute CHF.  She was supposedly very hypoxic with EMS but after I wean her oxygen down she is on room air.  Work-up is negative for PE.  Some small pleural effusions.  She was given a dose of IV Lasix.  She was also given a dose of IV metoprolol given some mild A. fib with RVR.  Also given some of her home meds.  However her heart rate has continued to worsen and now is in the 120s.  While she is not distressed I think we need to get her heart rate better under control and I have put her on Cardizem bolus and drip.  Discussed with Dr. Toniann Fail for admission.  She is noted to have abnormal urine but I think this is likely asymptomatic bacteriuria as she has no acute UTI symptoms.  Family also asked for patient to get femur x-ray as she has been nonoperatively treated for her right femur fracture.  They are worried she will be able  to get into the office to get repeat x-rays of these have been performed and message notified to Dr. Lequita Halt. Final Clinical Impression(s) / ED Diagnoses Final diagnoses:  Acute on chronic congestive heart failure, unspecified heart failure type (HCC)  Atrial fibrillation with RVR [facility])    Rx / DC Orders ED Discharge Orders    None       Pricilla Loveless, MD 09/11/19 [phone]

## 2019-09-10 NOTE — ED Notes (Signed)
Dr. Criss Alvine aware patients HR ranging between 90-135.

## 2019-09-10 NOTE — ED Notes (Signed)
Pt to CT

## 2019-09-10 NOTE — ED Triage Notes (Signed)
Pt BIB EMS from [facility]. Facility gave neb tx with no relief. EMS reports O2 of 65% RA. Pt has hx of COPD, A fib, heart murmur. Pt not on home O2. Pt on blood thinners. EMS put pt on nonrebreather at 99%. EMS reports lung sounds clear in all fields. Pt wearing brace for post hip surgery. A&Ox4.   18G L FA 122/94

## 2019-09-10 NOTE — ED Notes (Signed)
PTAR called for pt transfer back to facility  

## 2019-09-11 ENCOUNTER — Telehealth: Payer: Self-pay | Admitting: Cardiovascular Disease

## 2019-09-11 ENCOUNTER — Encounter (HOSPITAL_COMMUNITY): Payer: Self-pay | Admitting: Internal Medicine

## 2019-09-11 ENCOUNTER — Inpatient Hospital Stay (HOSPITAL_COMMUNITY): Payer: Medicare PPO

## 2019-09-11 DIAGNOSIS — I5043 Acute on chronic combined systolic (congestive) and diastolic (congestive) heart failure: Secondary | ICD-10-CM

## 2019-09-11 DIAGNOSIS — E440 Moderate protein-calorie malnutrition: Secondary | ICD-10-CM | POA: Diagnosis present

## 2019-09-11 DIAGNOSIS — I4819 Other persistent atrial fibrillation: Secondary | ICD-10-CM | POA: Diagnosis present

## 2019-09-11 DIAGNOSIS — F419 Anxiety disorder, unspecified: Secondary | ICD-10-CM | POA: Diagnosis present

## 2019-09-11 DIAGNOSIS — J9601 Acute respiratory failure with hypoxia: Secondary | ICD-10-CM | POA: Diagnosis present

## 2019-09-11 DIAGNOSIS — Z823 Family history of stroke: Secondary | ICD-10-CM | POA: Diagnosis not present

## 2019-09-11 DIAGNOSIS — I509 Heart failure, unspecified: Secondary | ICD-10-CM | POA: Diagnosis not present

## 2019-09-11 DIAGNOSIS — E785 Hyperlipidemia, unspecified: Secondary | ICD-10-CM | POA: Diagnosis present

## 2019-09-11 DIAGNOSIS — I4891 Unspecified atrial fibrillation: Secondary | ICD-10-CM | POA: Diagnosis present

## 2019-09-11 DIAGNOSIS — L89151 Pressure ulcer of sacral region, stage 1: Secondary | ICD-10-CM | POA: Diagnosis present

## 2019-09-11 DIAGNOSIS — R946 Abnormal results of thyroid function studies: Secondary | ICD-10-CM | POA: Diagnosis present

## 2019-09-11 DIAGNOSIS — Z681 Body mass index (BMI) 19 or less, adult: Secondary | ICD-10-CM | POA: Diagnosis not present

## 2019-09-11 DIAGNOSIS — M9701XD Periprosthetic fracture around internal prosthetic right hip joint, subsequent encounter: Secondary | ICD-10-CM | POA: Diagnosis not present

## 2019-09-11 DIAGNOSIS — F329 Major depressive disorder, single episode, unspecified: Secondary | ICD-10-CM | POA: Diagnosis present

## 2019-09-11 DIAGNOSIS — I110 Hypertensive heart disease with heart failure: Secondary | ICD-10-CM | POA: Diagnosis present

## 2019-09-11 DIAGNOSIS — I428 Other cardiomyopathies: Secondary | ICD-10-CM | POA: Diagnosis present

## 2019-09-11 DIAGNOSIS — Z66 Do not resuscitate: Secondary | ICD-10-CM | POA: Diagnosis present

## 2019-09-11 DIAGNOSIS — F0390 Unspecified dementia without behavioral disturbance: Secondary | ICD-10-CM | POA: Diagnosis present

## 2019-09-11 DIAGNOSIS — F259 Schizoaffective disorder, unspecified: Secondary | ICD-10-CM | POA: Diagnosis present

## 2019-09-11 DIAGNOSIS — J449 Chronic obstructive pulmonary disease, unspecified: Secondary | ICD-10-CM | POA: Diagnosis present

## 2019-09-11 DIAGNOSIS — Z20822 Contact with and (suspected) exposure to covid-19: Secondary | ICD-10-CM | POA: Diagnosis present

## 2019-09-11 DIAGNOSIS — N179 Acute kidney failure, unspecified: Secondary | ICD-10-CM | POA: Diagnosis present

## 2019-09-11 DIAGNOSIS — F411 Generalized anxiety disorder: Secondary | ICD-10-CM | POA: Diagnosis present

## 2019-09-11 DIAGNOSIS — D649 Anemia, unspecified: Secondary | ICD-10-CM | POA: Diagnosis present

## 2019-09-11 DIAGNOSIS — I5033 Acute on chronic diastolic (congestive) heart failure: Secondary | ICD-10-CM | POA: Diagnosis present

## 2019-09-11 DIAGNOSIS — I480 Paroxysmal atrial fibrillation: Secondary | ICD-10-CM | POA: Diagnosis not present

## 2019-09-11 DIAGNOSIS — F0150 Vascular dementia without behavioral disturbance: Secondary | ICD-10-CM | POA: Diagnosis not present

## 2019-09-11 DIAGNOSIS — R296 Repeated falls: Secondary | ICD-10-CM | POA: Diagnosis present

## 2019-09-11 DIAGNOSIS — I5082 Biventricular heart failure: Secondary | ICD-10-CM | POA: Diagnosis present

## 2019-09-11 LAB — CBC WITH DIFFERENTIAL/PLATELET
Abs Immature Granulocytes: 0.05 10*3/uL (ref 0.00–0.07)
Basophils Absolute: 0.1 10*3/uL (ref 0.0–0.1)
Basophils Relative: 1 %
Eosinophils Absolute: 0.1 10*3/uL (ref 0.0–0.5)
Eosinophils Relative: 1 %
HCT: 36.7 % (ref 36.0–46.0)
Hemoglobin: 11.6 g/dL — ABNORMAL LOW (ref 12.0–15.0)
Immature Granulocytes: 1 %
Lymphocytes Relative: 29 %
Lymphs Abs: 2.5 10*3/uL (ref 0.7–4.0)
MCH: 30.5 pg (ref 26.0–34.0)
MCHC: 31.6 g/dL (ref 30.0–36.0)
MCV: 96.6 fL (ref 80.0–100.0)
Monocytes Absolute: 0.8 10*3/uL (ref 0.1–1.0)
Monocytes Relative: 10 %
Neutro Abs: 5.1 10*3/uL (ref 1.7–7.7)
Neutrophils Relative %: 58 %
Platelets: 258 10*3/uL (ref 150–400)
RBC: 3.8 MIL/uL — ABNORMAL LOW (ref 3.87–5.11)
RDW: 15.8 % — ABNORMAL HIGH (ref 11.5–15.5)
WBC: 8.5 10*3/uL (ref 4.0–10.5)
nRBC: 0 % (ref 0.0–0.2)

## 2019-09-11 LAB — COMPREHENSIVE METABOLIC PANEL
ALT: 11 U/L (ref 0–44)
AST: 23 U/L (ref 15–41)
Albumin: 3.7 g/dL (ref 3.5–5.0)
Alkaline Phosphatase: 65 U/L (ref 38–126)
Anion gap: 9 (ref 5–15)
BUN: 23 mg/dL (ref 8–23)
CO2: 26 mmol/L (ref 22–32)
Calcium: 9 mg/dL (ref 8.9–10.3)
Chloride: 101 mmol/L (ref 98–111)
Creatinine, Ser: 0.81 mg/dL (ref 0.44–1.00)
GFR calc Af Amer: >60 mL/min (ref >60)
GFR calc non Af Amer: >60 mL/min (ref >60)
Glucose, Bld: 103 mg/dL — ABNORMAL HIGH (ref 70–99)
Potassium: 3.8 mmol/L (ref 3.5–5.1)
Sodium: 136 mmol/L (ref 135–145)
Total Bilirubin: 0.7 mg/dL (ref 0.3–1.2)
Total Protein: 7.3 g/dL (ref 6.5–8.1)

## 2019-09-11 LAB — TROPONIN I (HIGH SENSITIVITY): Troponin I (High Sensitivity): 12 ng/L (ref <18)

## 2019-09-11 LAB — MRSA PCR SCREENING: MRSA by PCR: NEGATIVE

## 2019-09-11 LAB — TSH: TSH: 5.077 u[IU]/mL — ABNORMAL HIGH (ref 0.350–4.500)

## 2019-09-11 LAB — ECHOCARDIOGRAM COMPLETE: S' Lateral: 2.5 cm

## 2019-09-11 LAB — MAGNESIUM: Magnesium: 1.9 mg/dL (ref 1.7–2.4)

## 2019-09-11 MED ORDER — METOPROLOL TARTRATE 25 MG PO TABS
50.0000 mg | ORAL_TABLET | Freq: Two times a day (BID) | ORAL | Status: DC
  Administered 2019-09-11 – 2019-09-14 (×6): 50 mg via ORAL
  Filled 2019-09-11 (×6): qty 2

## 2019-09-11 MED ORDER — DILTIAZEM HCL-DEXTROSE 125-5 MG/125ML-% IV SOLN (PREMIX)
5.0000 mg/h | INTRAVENOUS | Status: DC
  Administered 2019-09-11: 5 mg/h via INTRAVENOUS
  Filled 2019-09-11: qty 125

## 2019-09-11 MED ORDER — ORAL CARE MOUTH RINSE
15.0000 mL | Freq: Two times a day (BID) | OROMUCOSAL | Status: DC
  Administered 2019-09-11 – 2019-09-15 (×9): 15 mL via OROMUCOSAL

## 2019-09-11 MED ORDER — ONDANSETRON HCL 4 MG/2ML IJ SOLN: 4.0000 mg | Freq: Four times a day (QID) | INTRAMUSCULAR | Status: DC | PRN

## 2019-09-11 MED ORDER — LORAZEPAM 0.5 MG PO TABS
0.5000 mg | ORAL_TABLET | Freq: Every day | ORAL | Status: DC
  Administered 2019-09-11 – 2019-09-15 (×5): 0.5 mg via ORAL
  Filled 2019-09-11 (×5): qty 1

## 2019-09-11 MED ORDER — ZIPRASIDONE HCL 20 MG PO CAPS
20.0000 mg | ORAL_CAPSULE | Freq: Every day | ORAL | Status: DC
  Administered 2019-09-11 – 2019-09-15 (×5): 20 mg via ORAL
  Filled 2019-09-11 (×5): qty 1

## 2019-09-11 MED ORDER — METOPROLOL TARTRATE 25 MG PO TABS
25.0000 mg | ORAL_TABLET | Freq: Two times a day (BID) | ORAL | Status: DC
  Administered 2019-09-11: 25 mg via ORAL
  Filled 2019-09-11: qty 1

## 2019-09-11 MED ORDER — CITALOPRAM HYDROBROMIDE 20 MG PO TABS
20.0000 mg | ORAL_TABLET | Freq: Every day | ORAL | Status: DC
  Administered 2019-09-11 – 2019-09-15 (×5): 20 mg via ORAL
  Filled 2019-09-11: qty 1
  Filled 2019-09-11: qty 2
  Filled 2019-09-11 (×3): qty 1

## 2019-09-11 MED ORDER — ONDANSETRON HCL 4 MG PO TABS: 4.0000 mg | ORAL_TABLET | Freq: Four times a day (QID) | ORAL | Status: DC | PRN

## 2019-09-11 MED ORDER — DILTIAZEM LOAD VIA INFUSION
10.0000 mg | Freq: Once | INTRAVENOUS | Status: AC
  Administered 2019-09-11: 10 mg via INTRAVENOUS
  Filled 2019-09-11: qty 10

## 2019-09-11 MED ORDER — APIXABAN 2.5 MG PO TABS
2.5000 mg | ORAL_TABLET | Freq: Two times a day (BID) | ORAL | Status: DC
  Administered 2019-09-11 – 2019-09-15 (×10): 2.5 mg via ORAL
  Filled 2019-09-11 (×11): qty 1

## 2019-09-11 MED ORDER — AMLODIPINE BESYLATE 5 MG PO TABS
5.0000 mg | ORAL_TABLET | Freq: Every day | ORAL | Status: DC
  Administered 2019-09-11 – 2019-09-15 (×5): 5 mg via ORAL
  Filled 2019-09-11 (×5): qty 1

## 2019-09-11 MED ORDER — GALANTAMINE HYDROBROMIDE ER 8 MG PO CP24
8.0000 mg | ORAL_CAPSULE | Freq: Every day | ORAL | Status: DC
  Administered 2019-09-11 – 2019-09-15 (×5): 8 mg via ORAL
  Filled 2019-09-11 (×6): qty 1

## 2019-09-11 MED ORDER — CHLORHEXIDINE GLUCONATE CLOTH 2 % EX PADS
6.0000 | MEDICATED_PAD | Freq: Every day | CUTANEOUS | Status: DC
  Administered 2019-09-11 – 2019-09-15 (×5): 6 via TOPICAL

## 2019-09-11 MED ORDER — FUROSEMIDE 10 MG/ML IJ SOLN
20.0000 mg | Freq: Every day | INTRAMUSCULAR | Status: DC
  Administered 2019-09-11 – 2019-09-13 (×3): 20 mg via INTRAVENOUS
  Filled 2019-09-11 (×2): qty 2
  Filled 2019-09-11: qty 4

## 2019-09-11 MED ORDER — POTASSIUM CHLORIDE CRYS ER 20 MEQ PO TBCR
20.0000 meq | EXTENDED_RELEASE_TABLET | ORAL | Status: DC
  Administered 2019-09-11 – 2019-09-13 (×2): 20 meq via ORAL
  Filled 2019-09-11 (×3): qty 1

## 2019-09-11 NOTE — Telephone Encounter (Signed)
New message     Daughter states that patient is currently at [facility] long hosp ER waiting on a room. She is being admitted for AFIB.  Daughter want Dr Duke Salvia to know and possible see her if possible

## 2019-09-11 NOTE — Progress Notes (Signed)
Please see H&P from this morning for full details.  Patient still in ED room.  Awaiting medical floor bed availability.  She feels somewhat better but still dyspneic while eating or talking full sentences..  She got metoprolol 25 mg 2 hours back.  Telemetry monitor showing heart rate between 95-120.  Remains on Cardizem drip at 5 mcg/ hour.  Will continue to monitor closely on telemetry and increase metoprolol dosage to 50 mg with holding parameters.  Informed daughter that patient will stay another midnight in the hospital.  BNP elevated at 596.  Continue IV diuresis.  Will repeat echo (last done in 2020).  Currently saturating well on room air.

## 2019-09-11 NOTE — Progress Notes (Signed)
[facility] Progress Note Patient Details:  Yolanda Baker 06-20-1923 [id number]  Patient ID: Yolanda Baker, female   DOB: March 04, 1923, 86 y.o.   MRN: [id number]   Saul Fordyce 09/11/2019, 2:27 PMPatient has right custom femur brace on.

## 2019-09-11 NOTE — ED Notes (Signed)
Hospitalist at bedside 

## 2019-09-11 NOTE — Telephone Encounter (Signed)
Advised would make Dr Duke Salvia aware however it will be one of her partners seeing her if cardiology called in

## 2019-09-11 NOTE — ED Notes (Signed)
Pt awake, eating breakfast.  No needs expressed, will continue to monitor.

## 2019-09-11 NOTE — ED Notes (Signed)
PTAR cancelled, provider changed disposition to admission

## 2019-09-11 NOTE — H&P (Signed)
History and Physical    Moya Duan Bensen AVW:[id number] DOB: 1923-06-17 DOA: 09/10/2019  PCP: Rodrigo Ran, MD  Patient coming from: Skilled nursing facility.  Chief Complaint: Shortness of breath.  HPI: Yolanda Baker is a 86 y.o. female with known history of A. fib, diastolic CHF last EF measured was 50 to 55% in December 2020, dementia recently admitted in March for hip fracture was managed conservatively with brace was brought to the ER because of increasing shortness of breath.  He does not exactly know how long patient was short of breath.  Patient was found to be hypoxic and tachycardic was brought to the ER.  ED Course: In the ER patient was in A. fib with RVR initially metoprolol was given but had to be started on Cardizem infusion for rate control.  CT angiogram of the chest done to rule out PE was negative for pulmonary embolism but did show features concerning for CHF.  Patient was given IV Lasix and admitted for CHF and A. fib with RVR.  Complete metabolic panel and CBC are unremarkable troponins were negative Covid test was negative.  BNP was 596.  Review of Systems: As per HPI, rest all negative.   Past Medical History:  Diagnosis Date  . Anxiety   . Arthritis    neck  . Bruises easily   . COPD (chronic obstructive pulmonary disease) (HCC)   . Depression   . Dysrhythmia    hx A-FLUTTER after hip surg April 2015- take metoprolol  . Eczema   . Hip joint pain    RT  . Hypertension   . Memory loss   . Osteoporosis   . PAF (paroxysmal atrial fibrillation) (HCC) 08/04/2019  . Pneumonia    august 2017  . Shortness of breath    with activity  . Stroke [facility])    "mini stroke" per MRI - no deficiets     Past Surgical History:  Procedure Laterality Date  . ABDOMINAL HYSTERECTOMY    . FRACTURE SURGERY Right 06/05/2013   hip  . HIP ARTHROPLASTY Right 10/03/2013   Procedure: CONVERSION OF PREVIOUS RIGHT HIP SURGERY TO A RIGHT TOTAL HIP ARTHROPLASTY;  Surgeon: Loanne Drilling,  MD;  Location: [facility];  Service: Orthopedics;  Laterality: Right;  . INTRAMEDULLARY (IM) NAIL INTERTROCHANTERIC Right 06/05/2013   Procedure: INTRAMEDULLARY (IM) NAIL INTERTROCHANTRIC;  Surgeon: Loanne Drilling, MD;  Location: [facility];  Service: Orthopedics;  Laterality: Right;  . JOINT REPLACEMENT     left hip  . left hip Left 2008  . LEG SURGERY Right    x2     reports that she has never smoked. She has never used smokeless tobacco. She reports that she does not drink alcohol and does not use drugs.  Allergies  Allergen Reactions  . Flagyl [Metronidazole]     Fingers get "numb"    Family History  Problem Relation Age of Onset  . Heart attack Father   . Hypertension Father   . Hypertension Sister   . Stroke Sister   . Hypertension Brother     Prior to Admission medications   Medication Sig Start Date End Date Taking? Authorizing Provider  acetaminophen (TYLENOL) 500 MG tablet Take 1 tablet (500 mg total) by mouth 2 (two) times daily as needed for mild pain or moderate pain. 03/02/15  Yes Leta Baptist, MD  amLODipine (NORVASC) 5 MG tablet Take 5 mg by mouth daily.   Yes [provider]  apixaban (ELIQUIS) 2.5  MG TABS tablet Take 1 tablet (2.5 mg total) by mouth 2 (two) times daily. 07/01/14  Yes Layne Benton, NP  chlorhexidine (PERIDEX) 0.12 % solution Use as directed 15 mLs in the mouth or throat 2 (two) times daily. Swish and spit   Yes [provider]  Chlorpheniramine-APAP (CORICIDIN) 2-325 MG TABS Take 1 tablet by mouth 2 (two) times daily as needed (congestion).   Yes [provider]  citalopram (CELEXA) 20 MG tablet TAKE ONE TABLET BY MOUTH DAILY **NO REFILLS**mild COURTESY FILL 08/10/19 Patient taking differently: Take 20 mg by mouth daily.  08/10/19  Yes Cottle, Steva Ready., MD  furosemide (LASIX) 20 MG tablet Take 1 tablet (20 mg total) by mouth every Monday, Wednesday, and Friday. 02/09/19  Yes Albertine Grates, MD  galantamine (RAZADYNE ER) 8 MG 24  hr capsule Take 8 mg by mouth daily with breakfast.   Yes [provider]  HYDROcodone-acetaminophen (NORCO/VICODIN) 5-325 MG tablet Take 1-2 tablets by mouth every 6 (six) hours as needed for moderate pain. 05/10/19  Yes Pokhrel, Laxman, MD  loperamide (IMODIUM) 2 MG capsule Take 2 mg by mouth as needed for diarrhea or loose stools.  07/14/19  Yes [provider]  LORazepam (ATIVAN) 0.5 MG tablet Take 1 tablet (0.5 mg total) by mouth 2 (two) times daily. Patient taking differently: Take 0.5 mg by mouth daily.  02/04/19  Yes Swayze, Ava, DO  metoprolol tartrate (LOPRESSOR) 25 MG tablet Take 25 mg by mouth 2 (two) times daily. 09/06/19  Yes [provider]  Multiple Vitamins-Minerals (CERTAVITE SENIOR/ANTIOXIDANT PO) Take 1 tablet by mouth daily.    Yes [provider]  potassium chloride SA (K-DUR,KLOR-CON) 20 MEQ tablet Take 20 mEq by mouth every other day.   Yes [provider]  psyllium (REGULOID) 0.52 g capsule Take 0.52 g by mouth daily.   Yes [provider]  simvastatin (ZOCOR) 10 MG tablet Take 1 tablet (10 mg total) by mouth daily at 6 PM. 07/01/14  Yes Biby, Jani Files, NP  STIOLTO RESPIMAT 2.5-2.5 MCG/ACT AERS Inhale 2 puffs into the lungs daily. 04/17/19  Yes [provider]  ziprasidone (GEODON) 20 MG capsule TAKE ONE CAPSULE BY MOUTH DAILY IN THE AFTERNOON **DO NOT CRUSH** **NO REFILLS** Patient taking differently: Take 20 mg by mouth daily.  09/10/19  Yes Cottle, Steva Ready., MD  citalopram (CELEXA) 10 MG tablet Take 20 mg by mouth daily.  01/03/15   [provider]  ipratropium-albuterol (DUONEB) 0.5-2.5 (3) MG/3ML SOLN Administer by nebulizer every 6 hours as needed for shortness of breath. Patient not taking: Reported on 08/04/2019 05/14/19   Molpus, Jonny Ruiz, MD  methocarbamol (ROBAXIN) 500 MG tablet Take 1 tablet (500 mg total) by mouth every 6 (six) hours as needed for muscle spasms. Patient not taking: Reported on 08/04/2019  05/10/19   Joycelyn Das, MD  metoprolol tartrate (LOPRESSOR) 100 MG tablet Take 25 mg by mouth 2 (two) times daily.  Patient not taking: Reported on 09/10/2019 05/04/19   [provider]  Polyethyl Glycol-Propyl Glycol (SYSTANE) 0.4-0.3 % SOLN Place 1 drop into both eyes every hour as needed (dry eyes/irritation).    [provider]  saline (AYR) GEL Place 1 application into both nostrils at bedtime. Patient not taking: Reported on 09/10/2019    [provider]  sodium chloride (OCEAN) 0.65 % SOLN nasal spray Place 3 sprays into both nostrils 4 (four) times daily. Patient not taking: Reported on 09/10/2019  [provider]  ziprasidone (GEODON) 60 MG capsule Take 40 mg by mouth daily. 40mg  in the afternoon Patient not taking: Reported on 09/10/2019 06/08/15   [provider]    Physical Exam: Constitutional: Moderately built and nourished. Vitals:   09/11/19 0049 09/11/19 0239 09/11/19 0325 09/11/19 0330  BP: (!) 146/95 115/83 123/75 117/83  Pulse: 95 (!) 127 75 92  Resp: 19 20 (!) 21 11  Temp:      TempSrc:      SpO2: 98% 98% 98% 97%   Eyes: Anicteric no pallor. ENMT: No discharge from the ears eyes nose or mouth. Neck: No mass felt.  No neck rigidity.  JVD elevated. Respiratory: No rhonchi or crepitations. Cardiovascular: S1-S2 heard. Abdomen: Soft nontender bowel sound present. Musculoskeletal: No edema.  Wears a brace. Skin: No rash. Neurologic: Alert awake oriented to name and place.  Moves all extremities. Psychiatric: Appears normal.   Labs on Admission: I have personally reviewed following labs and imaging studies  CBC: Recent Labs  Lab 09/10/19 1736  WBC 9.2  NEUTROABS 5.2  HGB 12.3  HCT 38.1  MCV 97.7  PLT 259   Basic Metabolic Panel: Recent Labs  Lab 09/10/19 1736  NA 139  K 4.2  CL 102  CO2 26  GLUCOSE 97  BUN 22  CREATININE 0.81  CALCIUM 9.2   GFR: CrCl cannot be calculated (Unknown ideal weight.).  Liver Function Tests: Recent Labs  Lab 09/10/19 1736  AST 22  ALT 12  ALKPHOS 75  BILITOT 0.8  PROT 7.7  ALBUMIN 3.8   No results for input(s): LIPASE, AMYLASE in the last 168 hours. No results for input(s): AMMONIA in the last 168 hours. Coagulation Profile: No results for input(s): INR, PROTIME in the last 168 hours. Cardiac Enzymes: No results for input(s): CKTOTAL, CKMB, CKMBINDEX, TROPONINI in the last 168 hours. BNP (last 3 results) No results for input(s): PROBNP in the last 8760 hours. HbA1C: No results for input(s): HGBA1C in the last 72 hours. CBG: No results for input(s): GLUCAP in the last 168 hours. Lipid Profile: No results for input(s): CHOL, HDL, LDLCALC, TRIG, CHOLHDL, LDLDIRECT in the last 72 hours. Thyroid Function Tests: No results for input(s): TSH, T4TOTAL, FREET4, T3FREE, THYROIDAB in the last 72 hours. Anemia Panel: No results for input(s): VITAMINB12, FOLATE, FERRITIN, TIBC, IRON, RETICCTPCT in the last 72 hours. Urine analysis:    Component Value Date/Time   COLORURINE YELLOW 09/10/2019 1737   APPEARANCEUR CLOUDY (A) 09/10/2019 1737   LABSPEC 1.014 09/10/2019 1737   PHURINE 7.0 09/10/2019 1737   GLUCOSEU NEGATIVE 09/10/2019 1737   HGBUR MODERATE (A) 09/10/2019 1737   BILIRUBINUR NEGATIVE 09/10/2019 1737   KETONESUR NEGATIVE 09/10/2019 1737   PROTEINUR NEGATIVE 09/10/2019 1737   UROBILINOGEN 0.2 06/29/2014 0943   NITRITE NEGATIVE 09/10/2019 1737   LEUKOCYTESUR SMALL (A) 09/10/2019 1737   Sepsis Labs: @LABRCNTIP (procalcitonin:4,lacticidven:4) ) Recent Results (from the past 240 hour(s))  SARS Coronavirus 2 by RT PCR (hospital order, performed in [facility] hospital lab) Nasopharyngeal Nasopharyngeal Swab     Status: None   Collection Time: 09/10/19  5:41 PM   Specimen: Nasopharyngeal Swab  Result Value Ref Range Status   SARS Coronavirus 2 NEGATIVE NEGATIVE Final    Comment: (NOTE) SARS-CoV-2 target nucleic acids are NOT DETECTED.   The SARS-CoV-2 RNA is generally detectable in upper and lower respiratory specimens during the acute phase of infection. The lowest concentration of SARS-CoV-2 viral copies this assay can detect is 250  copies / mL. A negative result does not preclude SARS-CoV-2 infection and should not be used as the sole basis for treatment or other patient management decisions.  A negative result may occur with improper specimen collection / handling, submission of specimen other than nasopharyngeal swab, presence of viral mutation(s) within the areas targeted by this assay, and inadequate number of viral copies (<250 copies / mL). A negative result must be combined with clinical observations, patient history, and epidemiological information.  Fact Sheet for Patients:   BoilerBrush.com.cy  Fact Sheet for Healthcare Providers: https://pope.com/  This test is not yet approved or  cleared by the [place] FDA and has been authorized for detection and/or diagnosis of SARS-CoV-2 by FDA under an Emergency Use Authorization (EUA).  This EUA will remain in effect (meaning this test can be used) for the duration of the COVID-19 declaration under Section 564(b)(1) of the Act, 21 U.S.C. section 360bbb-3(b)(1), unless the authorization is terminated or revoked sooner.  Performed at [facility], [address] [place], [place] [address]      Radiological Exams on Admission: CT Angio Chest PE W and/or Wo Contrast  Result Date: 09/10/2019 CLINICAL DATA:  86 year old female with COPD.  Hypoxia. EXAM: CT ANGIOGRAPHY CHEST WITH CONTRAST TECHNIQUE: Multidetector CT imaging of the chest was performed using the standard protocol during bolus administration of intravenous contrast. Multiplanar CT image reconstructions and MIPs were obtained to evaluate the vascular anatomy. CONTRAST:  OMNIPAQUE IOHEXOL 350 MG/ML SOLN COMPARISON:  CTA chest  02/03/2019. FINDINGS: Cardiovascular: Good contrast bolus timing in the pulmonary arterial tree. Mild respiratory motion, most pronounced at the lung bases. No focal filling defect identified in the pulmonary arteries to suggest acute pulmonary embolism. Stable cardiomegaly. No pericardial effusion. Calcified aortic atherosclerosis. Mediastinum/Nodes: Negative.  No mediastinal lymphadenopathy. Lungs/Pleura: Small bilateral layering pleural effusions. Enhancing compressive atelectasis at both lung bases. Lung volumes are not significantly changed compared to December, and the major airways remain patent. No pneumonia or other acute pulmonary opacity. Upper Abdomen: Contrast reflux into the hepatic IVC and hepatic veins. Otherwise negative visible liver. Negative visible spleen, adrenal glands, left kidney and bowel. Musculoskeletal: No acute osseous abnormality identified. Review of the MIP images confirms the above findings. IMPRESSION: 1. Negative for acute pulmonary embolus. 2. Small layering pleural effusions with compressive atelectasis at both lung bases. But no other acute pulmonary process. 3. Stable cardiomegaly. Contrast reflux into the hepatic veins and hepatic veins compatible with a degree of right heart failure. 4. Aortic Atherosclerosis (ICD10-I70.0). Electronically Signed   By: Odessa Fleming M.D.   On: 09/10/2019 21:00   DG Chest Portable 1 View  Result Date: 09/10/2019 CLINICAL DATA:  Acute dyspnea, hypoxia EXAM: PORTABLE CHEST 1 VIEW COMPARISON:  05/14/2019 FINDINGS: Single frontal view of the chest demonstrates persistent enlargement the cardiac silhouette. Stable ectasia of the thoracic aorta. There is background emphysema and hyperinflation. Minimal residual retrocardiac consolidation and trace left pleural effusion remain, markedly improved since previous study. Right chest is clear. No pneumothorax. IMPRESSION: 1. Minimal residual left basilar consolidation and effusion since prior study. 2.  Stable emphysema. Electronically Signed   By: Sharlet Salina M.D.   On: 09/10/2019 19:12   DG Femur Portable Min 2 Views Right  Result Date: 09/10/2019 CLINICAL DATA:  Chronic right femoral fracture. Assess interval healing. EXAM: RIGHT FEMUR PORTABLE 2 VIEW COMPARISON:  05/06/2019 FINDINGS: Surgical changes right total hip arthroplasty are again identified. An oblique fracture of the proximal femoral diaphysis surrounding  the distal femoral stem component is again identified and now demonstrates bridging callus between the fracture fragments in keeping with interval healing. There is residual deformity with minimal posterior angulation and approximately 1/2 shaft with lateral displacement of the distal femoral shaft. Extensive heterotopic ossification surrounding the femoral stem component of the arthroplasty involving the proximal femur in the region of the right hip is again identified. Marked protrusio is also, again, identified with 1 of the acetabular screws extending into the medial pelvis, unchanged from prior examination. Advanced vascular calcifications are seen within the soft tissues of the right lower extremity. IMPRESSION: Interval healing now with bridging callus between the fracture fragments. Mild residual deformity. 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 Electronically Signed   By: Helyn Numbers MD   On: 09/10/2019 20:11    EKG: Independently reviewed.  A. fib with RVR.  Assessment/Plan Principal Problem:   Atrial fibrillation with RVR (HCC) Active Problems:   Dementia (HCC)   Schizoaffective disorder (HCC)   Acute on chronic congestive heart failure (HCC)   Acute on chronic diastolic heart failure (HCC)   DNR (do not resuscitate) present on admission    1. A. fib with RVR precipitating cause not clear patient is on metoprolol which has been already dosed patient on Cardizem infusion.  We will try to wean off Cardizem after patient takes  regular home dose of metoprolol.  Check TSH patient on apixaban. 2. Acute on chronic diastolic CHF last EF measured in December 2020 was 50 to 55%.  He did receive IV Lasix and I have placed patient on Lasix 20 mg IV daily.  Likely precipitated by A. fib with RVR.  Follow intake output Daily weights. 3. History of dementia on Razadyne also is on Celexa Geodon Ativan for depression anxiety. 4. Hypertension on amlodipine and metoprolol. 5. Recent right hip periprosthetic fracture being managed conservatively and wears a brace. 6. Hyperlipidemia on statins.   DVT prophylaxis: Apixaban. Code Status: DNR. Family Communication: We will need to discuss with family. Disposition Plan: Back to facility when stable. Consults called: None. Admission status: Observation.   Eduard Clos MD [facility] Pager [phone].  If 7PM-7AM, please contact night-coverage [email or web address] Password [email or web address]  09/11/2019, 4:23 AM

## 2019-09-12 DIAGNOSIS — I5043 Acute on chronic combined systolic (congestive) and diastolic (congestive) heart failure: Secondary | ICD-10-CM | POA: Diagnosis not present

## 2019-09-12 DIAGNOSIS — F259 Schizoaffective disorder, unspecified: Secondary | ICD-10-CM | POA: Diagnosis not present

## 2019-09-12 DIAGNOSIS — I4891 Unspecified atrial fibrillation: Secondary | ICD-10-CM | POA: Diagnosis not present

## 2019-09-12 DIAGNOSIS — F0150 Vascular dementia without behavioral disturbance: Secondary | ICD-10-CM | POA: Diagnosis not present

## 2019-09-12 LAB — CBC WITH DIFFERENTIAL/PLATELET
Abs Immature Granulocytes: 0.04 10*3/uL (ref 0.00–0.07)
Basophils Absolute: 0.1 10*3/uL (ref 0.0–0.1)
Basophils Relative: 1 %
Eosinophils Absolute: 0.2 10*3/uL (ref 0.0–0.5)
Eosinophils Relative: 3 %
HCT: 36.2 % (ref 36.0–46.0)
Hemoglobin: 11.5 g/dL — ABNORMAL LOW (ref 12.0–15.0)
Immature Granulocytes: 1 %
Lymphocytes Relative: 34 %
Lymphs Abs: 2.8 10*3/uL (ref 0.7–4.0)
MCH: 30.8 pg (ref 26.0–34.0)
MCHC: 31.8 g/dL (ref 30.0–36.0)
MCV: 97.1 fL (ref 80.0–100.0)
Monocytes Absolute: 0.9 10*3/uL (ref 0.1–1.0)
Monocytes Relative: 11 %
Neutro Abs: 4.2 10*3/uL (ref 1.7–7.7)
Neutrophils Relative %: 50 %
Platelets: 229 10*3/uL (ref 150–400)
RBC: 3.73 MIL/uL — ABNORMAL LOW (ref 3.87–5.11)
RDW: 15.9 % — ABNORMAL HIGH (ref 11.5–15.5)
WBC: 8.2 10*3/uL (ref 4.0–10.5)
nRBC: 0 % (ref 0.0–0.2)

## 2019-09-12 LAB — COMPREHENSIVE METABOLIC PANEL
ALT: 11 U/L (ref 0–44)
AST: 25 U/L (ref 15–41)
Albumin: 3.6 g/dL (ref 3.5–5.0)
Alkaline Phosphatase: 57 U/L (ref 38–126)
Anion gap: 10 (ref 5–15)
BUN: 28 mg/dL — ABNORMAL HIGH (ref 8–23)
CO2: 26 mmol/L (ref 22–32)
Calcium: 8.9 mg/dL (ref 8.9–10.3)
Chloride: 100 mmol/L (ref 98–111)
Creatinine, Ser: 0.89 mg/dL (ref 0.44–1.00)
GFR calc Af Amer: >60 mL/min (ref >60)
GFR calc non Af Amer: 55 mL/min — ABNORMAL LOW (ref >60)
Glucose, Bld: 95 mg/dL (ref 70–99)
Potassium: 4 mmol/L (ref 3.5–5.1)
Sodium: 136 mmol/L (ref 135–145)
Total Bilirubin: 0.8 mg/dL (ref 0.3–1.2)
Total Protein: 7.3 g/dL (ref 6.5–8.1)

## 2019-09-12 LAB — T4, FREE: Free T4: 0.91 ng/dL (ref 0.61–1.12)

## 2019-09-12 MED ORDER — METOPROLOL TARTRATE 50 MG PO TABS: 50.0000 mg | ORAL_TABLET | Freq: Two times a day (BID) | ORAL | 0 refills | Status: DC

## 2019-09-12 NOTE — Progress Notes (Signed)
PROGRESS NOTE    Yolanda Baker  YIR:[id number] DOB: 13-Jul-1923 DOA: 09/10/2019 PCP: Rodrigo Ran, MD   Brief Narrative:  HPI: Yolanda Baker is a 86 y.o. female with known history of A. fib, diastolic CHF last EF measured was 50 to 55% in December 2020, dementia recently admitted in March for hip fracture was managed conservatively with brace was brought to the ER because of increasing shortness of breath.  He does not exactly know how long patient was short of breath.  Patient was found to be hypoxic and tachycardic was brought to the ER.  ED Course: In the ER patient was in A. fib with RVR initially metoprolol was given but had to be started on Cardizem infusion for rate control.  CT angiogram of the chest done to rule out PE was negative for pulmonary embolism but did show features concerning for CHF.  Patient was given IV Lasix and admitted for CHF and A. fib with RVR.  Complete metabolic panel and CBC are unremarkable troponins were negative Covid test was negative.  BNP was 596.   Assessment & Plan:   Principal Problem:   Atrial fibrillation with RVR (HCC) Active Problems:   Dementia (HCC)   Schizoaffective disorder (HCC)   Acute on chronic congestive heart failure (HCC)   Acute on chronic diastolic heart failure (HCC)   DNR (do not resuscitate) present on admission   1. A. fib with RVR precipitating cause not clear patient is on metoprolol which has been already dosed patient on Cardizem infusion at time of admission, weaned off, regular home dose of metoprolol increased to 50 bid, family requested cards consult which is pending,f/up T4, elev TSH,  patient on apixaban. 2. Acute on chronic diastolic CHF last EF measured in December 2020 was 50 to 55%.  He did receive IV Lasix and I have placed patient on Lasix 20 mg IV daily.  Likely precipitated by A. fib with RVR.  Follow intake output Daily weights. 3. History of dementia on Razadyne also is on Celexa Geodon Ativan for depression  anxiety. 4. Hypertension on amlodipine and metoprolol. 5. Recent right hip periprosthetic fracture being managed conservatively and wears a brace. 6. Hyperlipidemia on statins.  DVT prophylaxis: JK:[id number]  Code Status: DNR    Code Status Orders  (From admission, onward)         Start     Ordered   09/11/19 0422  Do not attempt resuscitation (DNR)  Continuous       Question Answer Comment  In the event of cardiac or respiratory ARREST Do not call a "code blue"   In the event of cardiac or respiratory ARREST Do not perform Intubation, CPR, defibrillation or ACLS   In the event of cardiac or respiratory ARREST Use medication by any route, position, wound care, and other measures to relive pain and suffering. May use oxygen, suction and manual treatment of airway obstruction as needed for comfort.      09/11/19 0422        Code Status History    Date Active Date Inactive Code Status Order ID Comments User Context   05/07/2019 1120 05/10/2019 2221 DNR [id number]  Joycelyn Das, MD Inpatient   05/06/2019 1848 05/07/2019 1119 Full Code [id number]  Joycelyn Das, MD Inpatient   02/03/2019 0804 02/07/2019 1722 Partial Code [id number]  Eduard Clos, MD ED   02/03/2019 0513 02/03/2019 0804 Full Code [id number]  Eduard Clos, MD ED   06/29/2014 1106 07/01/2014 1902 Full  Code [id number]  Thana Farr, MD Inpatient   10/03/2013 1122 10/07/2013 1549 Full Code [id number]  Loanne Drilling, MD Inpatient   06/05/2013 1937 06/08/2013 1612 Full Code [id number]  Loanne Drilling, MD Inpatient   06/04/2013 1746 06/05/2013 1937 Full Code [id number]  Henderson Cloud, MD Inpatient   Advance Care Planning Activity     Family Communication: DAUGHTER AT BEDSIDE  Disposition Plan:   Status is: Inpatient  Remains inpatient appropriate because:Ongoing diagnostic testing needed not appropriate for outpatient work up and Inpatient level of care appropriate due to severity of illness   Dispo: The  patient is from: SNF              Anticipated d/c is to: SNF              Anticipated d/c date is: 1 day              Patient currently is not medically stable to d/c.       Consults called: CARDS Admission status: Inpatient   Consultants:   AS ABOVE  Procedures:  CT Angio Chest PE W and/or Wo Contrast  Result Date: 09/10/2019 CLINICAL DATA:  86 year old female with COPD.  Hypoxia. EXAM: CT ANGIOGRAPHY CHEST WITH CONTRAST TECHNIQUE: Multidetector CT imaging of the chest was performed using the standard protocol during bolus administration of intravenous contrast. Multiplanar CT image reconstructions and MIPs were obtained to evaluate the vascular anatomy. CONTRAST:  OMNIPAQUE IOHEXOL 350 MG/ML SOLN COMPARISON:  CTA chest 02/03/2019. FINDINGS: Cardiovascular: Good contrast bolus timing in the pulmonary arterial tree. Mild respiratory motion, most pronounced at the lung bases. No focal filling defect identified in the pulmonary arteries to suggest acute pulmonary embolism. Stable cardiomegaly. No pericardial effusion. Calcified aortic atherosclerosis. Mediastinum/Nodes: Negative.  No mediastinal lymphadenopathy. Lungs/Pleura: Small bilateral layering pleural effusions. Enhancing compressive atelectasis at both lung bases. Lung volumes are not significantly changed compared to December, and the major airways remain patent. No pneumonia or other acute pulmonary opacity. Upper Abdomen: Contrast reflux into the hepatic IVC and hepatic veins. Otherwise negative visible liver. Negative visible spleen, adrenal glands, left kidney and bowel. Musculoskeletal: No acute osseous abnormality identified. Review of the MIP images confirms the above findings. IMPRESSION: 1. Negative for acute pulmonary embolus. 2. Small layering pleural effusions with compressive atelectasis at both lung bases. But no other acute pulmonary process. 3. Stable cardiomegaly. Contrast reflux into the hepatic veins and hepatic  veins compatible with a degree of right heart failure. 4. Aortic Atherosclerosis (ICD10-I70.0). Electronically Signed   By: Odessa Fleming M.D.   On: 09/10/2019 21:00   DG Chest Portable 1 View  Result Date: 09/10/2019 CLINICAL DATA:  Acute dyspnea, hypoxia EXAM: PORTABLE CHEST 1 VIEW COMPARISON:  05/14/2019 FINDINGS: Single frontal view of the chest demonstrates persistent enlargement the cardiac silhouette. Stable ectasia of the thoracic aorta. There is background emphysema and hyperinflation. Minimal residual retrocardiac consolidation and trace left pleural effusion remain, markedly improved since previous study. Right chest is clear. No pneumothorax. IMPRESSION: 1. Minimal residual left basilar consolidation and effusion since prior study. 2. Stable emphysema. Electronically Signed   By: Sharlet Salina M.D.   On: 09/10/2019 19:12   ECHOCARDIOGRAM COMPLETE  Result Date: 09/11/2019    ECHOCARDIOGRAM REPORT   Patient Name:   Yolanda Baker Date of Exam: 09/11/2019 Medical Rec #:  [id number]     Height:       61.0 in Accession #:    [id number]  Weight:       100.0 lb Date of Birth:  Jul 31, 1923     BSA:          1.407 m Patient Age:    89 years      BP:           118/61 mmHg Patient Gender: F             HR:           70 bpm. Exam Location:  Inpatient Procedure: 2D Echo                             MODIFIED REPORT: This report was modified by Donato Schultz MD on 09/11/2019 due to EF 45-50%.  Indications:     CHF  History:         Patient has prior history of Echocardiogram examinations, most                  recent 02/03/2019. COPD, Arrythmias:Atrial Fibrillation; Risk                  Factors:Hypertension.  Sonographer:     Thurman Coyer RDCS (AE) Referring Phys:  [id number] Alessandra Bevels Diagnosing Phys: Donato Schultz MD IMPRESSIONS  1. Left ventricular ejection fraction, by estimation, is 45 to 50%. The left ventricle has mildly decreased function. The left ventricle demonstrates global hypokinesis. There is mild  left ventricular hypertrophy. Left ventricular diastolic parameters are indeterminate.  2. Right ventricular systolic function is normal. The right ventricular size is normal. There is normal pulmonary artery systolic pressure. The estimated right ventricular systolic pressure is 27.6 mmHg.  3. Left atrial size was moderately dilated.  4. Right atrial size was mildly dilated.  5. The mitral valve is normal in structure. Mild mitral valve regurgitation. No evidence of mitral stenosis.  6. Tricuspid valve regurgitation is moderate.  7. The aortic valve is tricuspid. Aortic valve regurgitation is not visualized. Mild aortic valve sclerosis is present, with no evidence of aortic valve stenosis.  8. The inferior vena cava is normal in size with greater than 50% respiratory variability, suggesting right atrial pressure of 3 mmHg. FINDINGS  Left Ventricle: Left ventricular ejection fraction, by estimation, is 45 to 50%. The left ventricle has mildly decreased function. The left ventricle demonstrates global hypokinesis. The left ventricular internal cavity size was normal in size. There is  mild left ventricular hypertrophy. Left ventricular diastolic parameters are indeterminate. Right Ventricle: The right ventricular size is normal. No increase in right ventricular wall thickness. Right ventricular systolic function is normal. There is normal pulmonary artery systolic pressure. The tricuspid regurgitant velocity is 2.48 m/s, and  with an assumed right atrial pressure of 3 mmHg, the estimated right ventricular systolic pressure is 27.6 mmHg. Left Atrium: Left atrial size was moderately dilated. Right Atrium: Right atrial size was mildly dilated. Pericardium: There is no evidence of pericardial effusion. Mitral Valve: The mitral valve is normal in structure. Normal mobility of the mitral valve leaflets. Mild mitral valve regurgitation. No evidence of mitral valve stenosis. Tricuspid Valve: The tricuspid valve is normal in  structure. Tricuspid valve regurgitation is moderate . No evidence of tricuspid stenosis. Aortic Valve: The aortic valve is tricuspid. Aortic valve regurgitation is not visualized. Mild aortic valve sclerosis is present, with no evidence of aortic valve stenosis. Pulmonic Valve: The pulmonic valve was normal in structure. Pulmonic valve regurgitation is not visualized. No evidence of  pulmonic stenosis. Aorta: The aortic root is normal in size and structure. Venous: The inferior vena cava is normal in size with greater than 50% respiratory variability, suggesting right atrial pressure of 3 mmHg. IAS/Shunts: No atrial level shunt detected by color flow Doppler.  LEFT VENTRICLE PLAX 2D LVIDd:         3.20 cm LVIDs:         2.50 cm LV PW:         1.20 cm LV IVS:        0.90 cm LVOT diam:     2.00 cm LV SV:         39 LV SV Index:   28 LVOT Area:     3.14 cm  LEFT ATRIUM             Index       RIGHT ATRIUM           Index LA diam:        2.90 cm 2.06 cm/m  RA Area:     19.40 cm LA Vol (A2C):   59.0 ml 41.94 ml/m RA Volume:   44.80 ml  31.84 ml/m LA Vol (A4C):   59.5 ml 42.29 ml/m LA Biplane Vol: 62.3 ml 44.28 ml/m  AORTIC VALVE LVOT Vmax:   53.90 cm/s LVOT Vmean:  40.300 cm/s LVOT VTI:    0.124 m  AORTA Ao Root diam: 2.90 cm Ao Asc diam:  3.10 cm TRICUSPID VALVE TR Peak grad:   24.6 mmHg TR Vmax:        248.00 cm/s  SHUNTS Systemic VTI:  0.12 m Systemic Diam: 2.00 cm Donato Schultz MD Electronically signed by Donato Schultz MD Signature Date/Time: 09/11/2019/4:01:51 PM    Final (Updated)    DG Femur Portable Min 2 Views Right  Result Date: 09/10/2019 CLINICAL DATA:  Chronic right femoral fracture. Assess interval healing. EXAM: RIGHT FEMUR PORTABLE 2 VIEW COMPARISON:  05/06/2019 FINDINGS: Surgical changes right total hip arthroplasty are again identified. An oblique fracture of the proximal femoral diaphysis surrounding the distal femoral stem component is again identified and now demonstrates bridging callus  between the fracture fragments in keeping with interval healing. There is residual deformity with minimal posterior angulation and approximately 1/2 shaft with lateral displacement of the distal femoral shaft. Extensive heterotopic ossification surrounding the femoral stem component of the arthroplasty involving the proximal femur in the region of the right hip is again identified. Marked protrusio is also, again, identified with 1 of the acetabular screws extending into the medial pelvis, unchanged from prior examination. Advanced vascular calcifications are seen within the soft tissues of the right lower extremity. IMPRESSION: Interval healing now with bridging callus between the fracture fragments. Mild residual deformity. 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 Electronically Signed   By: Helyn Numbers MD   On: 09/10/2019 20:11     Antimicrobials:   NONE    Subjective: PT WEANED OFF DILT GTT, AWAIITNG CARDS CONSUTL, DUIRESING FOR CHF EXAC  Objective: Vitals:   09/12/19 1000 09/12/19 1200 09/12/19 1307 09/12/19 1400  BP: (!) 141/97  133/78   Pulse: 73  (!) 55   Resp: 12  17 (!) 22  Temp:  98.2 F (36.8 C)    TempSrc:  Oral    SpO2: 100%  100% 99%  Weight:      Height:        Intake/Output Summary (Last 24 hours) at 09/12/2019 1531 Last data filed at 09/12/2019 1300  Gross per 24 hour  Intake 284.37 ml  Output 1500 ml  Net -1215.63 ml   Filed Weights   09/11/19 1815 09/12/19 0331  Weight: 45.9 kg 47.3 kg    Examination:  General exam: Appears calm and comfortable  Respiratory system: Clear to auscultation. Respiratory effort normal. Cardiovascular system: S1 & S2 heard, RRR. No JVD, murmurs, rubs, gallops or clicks. No pedal edema. Gastrointestinal system: Abdomen is nondistended, soft and nontender. No organomegaly or masses felt. Normal bowel sounds heard. Central nervous system: Alert and oriented. No focal neurological  deficits. Extremities: WWP, THIN Skin: No rashes, lesions or ulcers Psychiatry: Judgement and insight appear normal. Mood & affect appropriate.     Data Reviewed: I have personally reviewed following labs and imaging studies  CBC: Recent Labs  Lab 09/10/19 1736 09/11/19 0505 09/12/19 0244  WBC 9.2 8.5 8.2  NEUTROABS 5.2 5.1 4.2  HGB 12.3 11.6* 11.5*  HCT 38.1 36.7 36.2  MCV 97.7 96.6 97.1  PLT 259 258 229   Basic Metabolic Panel: Recent Labs  Lab 09/10/19 1736 09/11/19 0505 09/12/19 0244  NA 139 136 136  K 4.2 3.8 4.0  CL 102 101 100  CO2 26 26 26   GLUCOSE 97 103* 95  BUN 22 23 28*  CREATININE 0.81 0.81 0.89  CALCIUM 9.2 9.0 8.9  MG  --  1.9  --    GFR: Estimated Creatinine Clearance: 28.9 mL/min (by C-G formula based on SCr of 0.89 mg/dL). Liver Function Tests: Recent Labs  Lab 09/10/19 1736 09/11/19 0505 09/12/19 0244  AST 22 23 25   ALT 12 11 11   ALKPHOS 75 65 57  BILITOT 0.8 0.7 0.8  PROT 7.7 7.3 7.3  ALBUMIN 3.8 3.7 3.6   No results for input(s): LIPASE, AMYLASE in the last 168 hours. No results for input(s): AMMONIA in the last 168 hours. Coagulation Profile: No results for input(s): INR, PROTIME in the last 168 hours. Cardiac Enzymes: No results for input(s): CKTOTAL, CKMB, CKMBINDEX, TROPONINI in the last 168 hours. BNP (last 3 results) No results for input(s): PROBNP in the last 8760 hours. HbA1C: No results for input(s): HGBA1C in the last 72 hours. CBG: No results for input(s): GLUCAP in the last 168 hours. Lipid Profile: No results for input(s): CHOL, HDL, LDLCALC, TRIG, CHOLHDL, LDLDIRECT in the last 72 hours. Thyroid Function Tests: Recent Labs    09/11/19 0505  TSH 5.077*   Anemia Panel: No results for input(s): VITAMINB12, FOLATE, FERRITIN, TIBC, IRON, RETICCTPCT in the last 72 hours. Sepsis Labs: Recent Labs  Lab 09/10/19 1736  LATICACIDVEN 1.8    Recent Results (from the past 240 hour(s))  Culture, blood (routine x 2)      Status: None (Preliminary result)   Collection Time: 09/10/19  5:36 PM   Specimen: BLOOD RIGHT FOREARM  Result Value Ref Range Status   Specimen Description   Final    BLOOD RIGHT FOREARM Performed at [facility], [address], [place], [place] [address]    Special Requests   Final    BOTTLES DRAWN AEROBIC AND ANAEROBIC Blood Culture adequate volume Performed at [facility], [address] [place], [place] [address]    Culture   Final    NO GROWTH 2 DAYS Performed at [facility], [address], [place], [place] [address]    Report Status PENDING  Incomplete  Culture, blood (routine x 2)     Status: None (Preliminary result)   Collection  Time: 09/10/19  5:37 PM   Specimen: BLOOD  Result Value Ref Range Status   Specimen Description   Final    BLOOD LEFT ANTECUBITAL Performed at [facility], [address], [place], [place] [address]    Special Requests   Final    BOTTLES DRAWN AEROBIC AND ANAEROBIC Blood Culture results may not be optimal due to an excessive volume of blood received in culture bottles Performed at [facility], [address] [place], [place] [address]    Culture   Final    NO GROWTH 2 DAYS Performed at [facility], [address], [place], [place] [address]    Report Status PENDING  Incomplete  SARS Coronavirus 2 by RT PCR (hospital order, performed in [facility] hospital lab) Nasopharyngeal Nasopharyngeal Swab     Status: None   Collection Time: 09/10/19  5:41 PM   Specimen: Nasopharyngeal Swab  Result Value Ref Range Status   SARS Coronavirus 2 NEGATIVE NEGATIVE Final    Comment: (NOTE) SARS-CoV-2 target nucleic acids are NOT DETECTED.  The SARS-CoV-2 RNA is generally detectable in upper and lower respiratory specimens during the acute phase of infection. The lowest concentration of SARS-CoV-2 viral copies this assay can detect is 250 copies / mL. A  negative result does not preclude SARS-CoV-2 infection and should not be used as the sole basis for treatment or other patient management decisions.  A negative result may occur with improper specimen collection / handling, submission of specimen other than nasopharyngeal swab, presence of viral mutation(s) within the areas targeted by this assay, and inadequate number of viral copies (<250 copies / mL). A negative result must be combined with clinical observations, patient history, and epidemiological information.  Fact Sheet for Patients:   BoilerBrush.com.cy  Fact Sheet for Healthcare Providers: https://pope.com/  This test is not yet approved or  cleared by the [place] FDA and has been authorized for detection and/or diagnosis of SARS-CoV-2 by FDA under an Emergency Use Authorization (EUA).  This EUA will remain in effect (meaning this test can be used) for the duration of the COVID-19 declaration under Section 564(b)(1) of the Act, 21 U.S.C. section 360bbb-3(b)(1), unless the authorization is terminated or revoked sooner.  Performed at [facility], [address], [place], [place] [address]   MRSA PCR Screening     Status: None   Collection Time: 09/11/19  6:07 PM   Specimen: Nasal Mucosa; Nasopharyngeal  Result Value Ref Range Status   MRSA by PCR NEGATIVE NEGATIVE Final    Comment:        The GeneXpert MRSA Assay (FDA approved for NASAL specimens only), is one component of a comprehensive MRSA colonization surveillance program. It is not intended to diagnose MRSA infection nor to guide or monitor treatment for MRSA infections. Performed at [facility], [address] [place], [place] [address]          Radiology Studies: CT Angio Chest PE W and/or Wo Contrast  Result Date: 09/10/2019 CLINICAL DATA:  86 year old female with COPD.  Hypoxia. EXAM: CT ANGIOGRAPHY CHEST  WITH CONTRAST TECHNIQUE: Multidetector CT imaging of the chest was performed using the standard protocol during bolus administration of intravenous contrast. Multiplanar CT image reconstructions and MIPs were obtained to evaluate the vascular anatomy. CONTRAST:  OMNIPAQUE IOHEXOL 350 MG/ML SOLN COMPARISON:  CTA chest 02/03/2019. FINDINGS: Cardiovascular: Good contrast bolus timing in the pulmonary arterial tree. Mild respiratory motion, most pronounced  at the lung bases. No focal filling defect identified in the pulmonary arteries to suggest acute pulmonary embolism. Stable cardiomegaly. No pericardial effusion. Calcified aortic atherosclerosis. Mediastinum/Nodes: Negative.  No mediastinal lymphadenopathy. Lungs/Pleura: Small bilateral layering pleural effusions. Enhancing compressive atelectasis at both lung bases. Lung volumes are not significantly changed compared to December, and the major airways remain patent. No pneumonia or other acute pulmonary opacity. Upper Abdomen: Contrast reflux into the hepatic IVC and hepatic veins. Otherwise negative visible liver. Negative visible spleen, adrenal glands, left kidney and bowel. Musculoskeletal: No acute osseous abnormality identified. Review of the MIP images confirms the above findings. IMPRESSION: 1. Negative for acute pulmonary embolus. 2. Small layering pleural effusions with compressive atelectasis at both lung bases. But no other acute pulmonary process. 3. Stable cardiomegaly. Contrast reflux into the hepatic veins and hepatic veins compatible with a degree of right heart failure. 4. Aortic Atherosclerosis (ICD10-I70.0). Electronically Signed   By: Odessa Fleming M.D.   On: 09/10/2019 21:00   DG Chest Portable 1 View  Result Date: 09/10/2019 CLINICAL DATA:  Acute dyspnea, hypoxia EXAM: PORTABLE CHEST 1 VIEW COMPARISON:  05/14/2019 FINDINGS: Single frontal view of the chest demonstrates persistent enlargement the cardiac silhouette. Stable ectasia of the  thoracic aorta. There is background emphysema and hyperinflation. Minimal residual retrocardiac consolidation and trace left pleural effusion remain, markedly improved since previous study. Right chest is clear. No pneumothorax. IMPRESSION: 1. Minimal residual left basilar consolidation and effusion since prior study. 2. Stable emphysema. Electronically Signed   By: Sharlet Salina M.D.   On: 09/10/2019 19:12   ECHOCARDIOGRAM COMPLETE  Result Date: 09/11/2019    ECHOCARDIOGRAM REPORT   Patient Name:   Yolanda Baker Date of Exam: 09/11/2019 Medical Rec #:  [id number]     Height:       61.0 in Accession #:    [id number]    Weight:       100.0 lb Date of Birth:  Feb 12, 1924     BSA:          1.407 m Patient Age:    89 years      BP:           118/61 mmHg Patient Gender: F             HR:           70 bpm. Exam Location:  Inpatient Procedure: 2D Echo                             MODIFIED REPORT: This report was modified by Donato Schultz MD on 09/11/2019 due to EF 45-50%.  Indications:     CHF  History:         Patient has prior history of Echocardiogram examinations, most                  recent 02/03/2019. COPD, Arrythmias:Atrial Fibrillation; Risk                  Factors:Hypertension.  Sonographer:     Thurman Coyer RDCS (AE) Referring Phys:  [id number] Alessandra Bevels Diagnosing Phys: Donato Schultz MD IMPRESSIONS  1. Left ventricular ejection fraction, by estimation, is 45 to 50%. The left ventricle has mildly decreased function. The left ventricle demonstrates global hypokinesis. There is mild left ventricular hypertrophy. Left ventricular diastolic parameters are indeterminate.  2. Right ventricular systolic function is normal. The right ventricular size is normal. There is normal pulmonary  artery systolic pressure. The estimated right ventricular systolic pressure is 27.6 mmHg.  3. Left atrial size was moderately dilated.  4. Right atrial size was mildly dilated.  5. The mitral valve is normal in structure. Mild  mitral valve regurgitation. No evidence of mitral stenosis.  6. Tricuspid valve regurgitation is moderate.  7. The aortic valve is tricuspid. Aortic valve regurgitation is not visualized. Mild aortic valve sclerosis is present, with no evidence of aortic valve stenosis.  8. The inferior vena cava is normal in size with greater than 50% respiratory variability, suggesting right atrial pressure of 3 mmHg. FINDINGS  Left Ventricle: Left ventricular ejection fraction, by estimation, is 45 to 50%. The left ventricle has mildly decreased function. The left ventricle demonstrates global hypokinesis. The left ventricular internal cavity size was normal in size. There is  mild left ventricular hypertrophy. Left ventricular diastolic parameters are indeterminate. Right Ventricle: The right ventricular size is normal. No increase in right ventricular wall thickness. Right ventricular systolic function is normal. There is normal pulmonary artery systolic pressure. The tricuspid regurgitant velocity is 2.48 m/s, and  with an assumed right atrial pressure of 3 mmHg, the estimated right ventricular systolic pressure is 27.6 mmHg. Left Atrium: Left atrial size was moderately dilated. Right Atrium: Right atrial size was mildly dilated. Pericardium: There is no evidence of pericardial effusion. Mitral Valve: The mitral valve is normal in structure. Normal mobility of the mitral valve leaflets. Mild mitral valve regurgitation. No evidence of mitral valve stenosis. Tricuspid Valve: The tricuspid valve is normal in structure. Tricuspid valve regurgitation is moderate . No evidence of tricuspid stenosis. Aortic Valve: The aortic valve is tricuspid. Aortic valve regurgitation is not visualized. Mild aortic valve sclerosis is present, with no evidence of aortic valve stenosis. Pulmonic Valve: The pulmonic valve was normal in structure. Pulmonic valve regurgitation is not visualized. No evidence of pulmonic stenosis. Aorta: The aortic root  is normal in size and structure. Venous: The inferior vena cava is normal in size with greater than 50% respiratory variability, suggesting right atrial pressure of 3 mmHg. IAS/Shunts: No atrial level shunt detected by color flow Doppler.  LEFT VENTRICLE PLAX 2D LVIDd:         3.20 cm LVIDs:         2.50 cm LV PW:         1.20 cm LV IVS:        0.90 cm LVOT diam:     2.00 cm LV SV:         39 LV SV Index:   28 LVOT Area:     3.14 cm  LEFT ATRIUM             Index       RIGHT ATRIUM           Index LA diam:        2.90 cm 2.06 cm/m  RA Area:     19.40 cm LA Vol (A2C):   59.0 ml 41.94 ml/m RA Volume:   44.80 ml  31.84 ml/m LA Vol (A4C):   59.5 ml 42.29 ml/m LA Biplane Vol: 62.3 ml 44.28 ml/m  AORTIC VALVE LVOT Vmax:   53.90 cm/s LVOT Vmean:  40.300 cm/s LVOT VTI:    0.124 m  AORTA Ao Root diam: 2.90 cm Ao Asc diam:  3.10 cm TRICUSPID VALVE TR Peak grad:   24.6 mmHg TR Vmax:        248.00 cm/s  SHUNTS Systemic VTI:  0.12 m Systemic Diam: 2.00 cm Donato Schultz MD Electronically signed by Donato Schultz MD Signature Date/Time: 09/11/2019/4:01:51 PM    Final (Updated)    DG Femur Portable Min 2 Views Right  Result Date: 09/10/2019 CLINICAL DATA:  Chronic right femoral fracture. Assess interval healing. EXAM: RIGHT FEMUR PORTABLE 2 VIEW COMPARISON:  05/06/2019 FINDINGS: Surgical changes right total hip arthroplasty are again identified. An oblique fracture of the proximal femoral diaphysis surrounding the distal femoral stem component is again identified and now demonstrates bridging callus between the fracture fragments in keeping with interval healing. There is residual deformity with minimal posterior angulation and approximately 1/2 shaft with lateral displacement of the distal femoral shaft. Extensive heterotopic ossification surrounding the femoral stem component of the arthroplasty involving the proximal femur in the region of the right hip is again identified. Marked protrusio is also, again, identified with 1  of the acetabular screws extending into the medial pelvis, unchanged from prior examination. Advanced vascular calcifications are seen within the soft tissues of the right lower extremity. IMPRESSION: Interval healing now with bridging callus between the fracture fragments. Mild residual deformity. 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 Electronically Signed   By: Helyn Numbers MD   On: 09/10/2019 20:11        Scheduled Meds: . amLODipine  5 mg Oral Daily  . apixaban  2.5 mg Oral BID  . Chlorhexidine Gluconate Cloth  6 each Topical Daily  . citalopram  20 mg Oral Daily  . furosemide  20 mg Intravenous Daily  . galantamine  8 mg Oral Q breakfast  . LORazepam  0.5 mg Oral Daily  . mouth rinse  15 mL Mouth Rinse BID  . metoprolol tartrate  50 mg Oral BID  . potassium chloride SA  20 mEq Oral QODAY  . ziprasidone  20 mg Oral Daily   Continuous Infusions: . diltiazem (CARDIZEM) infusion Stopped (09/11/19 2119)     LOS: 1 day    Time spent: 62 MIN    Burke Keels, MD [facility]  If 7PM-7AM, please contact night-coverage  09/12/2019, 3:31 PM

## 2019-09-13 ENCOUNTER — Inpatient Hospital Stay (HOSPITAL_COMMUNITY): Payer: Medicare PPO

## 2019-09-13 DIAGNOSIS — F259 Schizoaffective disorder, unspecified: Secondary | ICD-10-CM | POA: Diagnosis not present

## 2019-09-13 DIAGNOSIS — F0150 Vascular dementia without behavioral disturbance: Secondary | ICD-10-CM | POA: Diagnosis not present

## 2019-09-13 DIAGNOSIS — I5043 Acute on chronic combined systolic (congestive) and diastolic (congestive) heart failure: Secondary | ICD-10-CM | POA: Diagnosis not present

## 2019-09-13 DIAGNOSIS — I4891 Unspecified atrial fibrillation: Secondary | ICD-10-CM | POA: Diagnosis not present

## 2019-09-13 LAB — BASIC METABOLIC PANEL
Anion gap: 10 (ref 5–15)
BUN: 23 mg/dL (ref 8–23)
CO2: 27 mmol/L (ref 22–32)
Calcium: 8.4 mg/dL — ABNORMAL LOW (ref 8.9–10.3)
Chloride: 99 mmol/L (ref 98–111)
Creatinine, Ser: 0.85 mg/dL (ref 0.44–1.00)
GFR calc Af Amer: >60 mL/min (ref >60)
GFR calc non Af Amer: 59 mL/min — ABNORMAL LOW (ref >60)
Glucose, Bld: 96 mg/dL (ref 70–99)
Potassium: 3.5 mmol/L (ref 3.5–5.1)
Sodium: 136 mmol/L (ref 135–145)

## 2019-09-13 LAB — CBC WITH DIFFERENTIAL/PLATELET
Abs Immature Granulocytes: 0.04 10*3/uL (ref 0.00–0.07)
Basophils Absolute: 0.1 10*3/uL (ref 0.0–0.1)
Basophils Relative: 1 %
Eosinophils Absolute: 0.2 10*3/uL (ref 0.0–0.5)
Eosinophils Relative: 3 %
HCT: 37.2 % (ref 36.0–46.0)
Hemoglobin: 11.7 g/dL — ABNORMAL LOW (ref 12.0–15.0)
Immature Granulocytes: 1 %
Lymphocytes Relative: 27 %
Lymphs Abs: 1.8 10*3/uL (ref 0.7–4.0)
MCH: 31 pg (ref 26.0–34.0)
MCHC: 31.5 g/dL (ref 30.0–36.0)
MCV: 98.7 fL (ref 80.0–100.0)
Monocytes Absolute: 0.9 10*3/uL (ref 0.1–1.0)
Monocytes Relative: 13 %
Neutro Abs: 3.9 10*3/uL (ref 1.7–7.7)
Neutrophils Relative %: 55 %
Platelets: 249 10*3/uL (ref 150–400)
RBC: 3.77 MIL/uL — ABNORMAL LOW (ref 3.87–5.11)
RDW: 15.8 % — ABNORMAL HIGH (ref 11.5–15.5)
WBC: 6.9 10*3/uL (ref 4.0–10.5)
nRBC: 0 % (ref 0.0–0.2)

## 2019-09-13 MED ORDER — AMIODARONE HCL 200 MG PO TABS
200.0000 mg | ORAL_TABLET | Freq: Two times a day (BID) | ORAL | Status: DC
  Administered 2019-09-13 – 2019-09-15 (×6): 200 mg via ORAL
  Filled 2019-09-13 (×6): qty 1

## 2019-09-13 NOTE — Progress Notes (Signed)
PROGRESS NOTE    Yolanda Baker  JSE:[id number] DOB: 17-Nov-1923 DOA: 09/10/2019 PCP: Rodrigo Ran, MD   Brief Narrative:  WVP:[id number] Yolanda Baker a 86 y.o.femalewithknown history of A. fib, diastolic CHF last EF measured was 50 to 55% in December 2020, dementia recently admitted in March for hip fracture was managed conservatively with brace was brought to the ER because of increasing shortness of breath. He does not exactly know how long patient was short of breath. Patient was found to be hypoxic and tachycardic was brought to the ER.  ED Course:In the ER patient was in A. fib with RVR initially metoprolol was given but had to be started on Cardizem infusion for rate control. CT angiogram of the chest done to rule out PE was negative for pulmonary embolism but did show features concerning for CHF. Patient was given IV Lasix and admitted for CHF and A. fib with RVR. Complete metabolic panel and CBC are unremarkable troponins were negative Covid test was negative. BNP was 596   Assessment & Plan:   Principal Problem:   Atrial fibrillation with RVR (HCC) Active Problems:   Dementia (HCC)   Schizoaffective disorder (HCC)   Acute on chronic congestive heart failure (HCC)   Acute on chronic diastolic heart failure (HCC)   DNR (do not resuscitate) present on admission   1. A. fib with RVR precipitating cause not clear patient is on metoprolol which has been already dosed patient on Cardizem infusion at time of admission, weaned off, regular home dose of metoprolol increased to 50 bid, family requested cards consult which is pending,f/up T4, elev TSH,  patient on apixaban. 2. Acute on chronic diastolic CHF last EF measured in December 2020 was 50 to 55%. He did receive IV Lasix and I have placed patient on Lasix 20 mg IV daily. Likely precipitated by A. fib with RVR. Follow intake output Daily weights. 3. History of dementia on Razadyne also is on Celexa Geodon Ativan for depression  anxiety. 4. Hypertension on amlodipine and metoprolol. 5. Recent right hip periprosthetic fracture being managed conservatively and wears a brace. 6. Hyperlipidemia on statins  DVT prophylaxis: [id number]  Code Status: DNR    Code Status Orders  (From admission, onward)         Start     Ordered   09/11/19 0422  Do not attempt resuscitation (DNR)  Continuous       Question Answer Comment  In the event of cardiac or respiratory ARREST Do not call a "code blue"   In the event of cardiac or respiratory ARREST Do not perform Intubation, CPR, defibrillation or ACLS   In the event of cardiac or respiratory ARREST Use medication by any route, position, wound care, and other measures to relive pain and suffering. May use oxygen, suction and manual treatment of airway obstruction as needed for comfort.      09/11/19 0422        Code Status History    Date Active Date Inactive Code Status Order ID Comments User Context   05/07/2019 1120 05/10/2019 2221 DNR [id number]  Joycelyn Das, MD Inpatient   05/06/2019 1848 05/07/2019 1119 Full Code [id number]  Joycelyn Das, MD Inpatient   02/03/2019 0804 02/07/2019 1722 Partial Code [id number]  Eduard Clos, MD ED   02/03/2019 0513 02/03/2019 0804 Full Code [id number]  Eduard Clos, MD ED   06/29/2014 1106 07/01/2014 1902 Full Code [id number]  Thana Farr, MD Inpatient   10/03/2013 1122 10/07/2013 1549 Full Code  [id number]  Loanne Drilling, MD Inpatient   06/05/2013 1937 06/08/2013 1612 Full Code [id number]  Loanne Drilling, MD Inpatient   06/04/2013 1746 06/05/2013 1937 Full Code [id number]  Henderson Cloud, MD Inpatient   Advance Care Planning Activity     Family Communication: DAUGHTER AT BEDSIDE  Disposition Plan:   Status is: Inpatient  Remains inpatient appropriate because:Inpatient level of care appropriate due to severity of illness   Dispo: The patient is from: ALF              Anticipated d/c is to: ALF               Anticipated d/c date is: 2 days              Patient currently is not medically stable to d/c.       Consults called: CARDIOLOGY Admission status: Inpatient   Consultants:   AS ABOVE  Procedures:  CT Angio Chest PE W and/or Wo Contrast  Result Date: 09/10/2019 CLINICAL DATA:  86 year old female with COPD.  Hypoxia. EXAM: CT ANGIOGRAPHY CHEST WITH CONTRAST TECHNIQUE: Multidetector CT imaging of the chest was performed using the standard protocol during bolus administration of intravenous contrast. Multiplanar CT image reconstructions and MIPs were obtained to evaluate the vascular anatomy. CONTRAST:  OMNIPAQUE IOHEXOL 350 MG/ML SOLN COMPARISON:  CTA chest 02/03/2019. FINDINGS: Cardiovascular: Good contrast bolus timing in the pulmonary arterial tree. Mild respiratory motion, most pronounced at the lung bases. No focal filling defect identified in the pulmonary arteries to suggest acute pulmonary embolism. Stable cardiomegaly. No pericardial effusion. Calcified aortic atherosclerosis. Mediastinum/Nodes: Negative.  No mediastinal lymphadenopathy. Lungs/Pleura: Small bilateral layering pleural effusions. Enhancing compressive atelectasis at both lung bases. Lung volumes are not significantly changed compared to December, and the major airways remain patent. No pneumonia or other acute pulmonary opacity. Upper Abdomen: Contrast reflux into the hepatic IVC and hepatic veins. Otherwise negative visible liver. Negative visible spleen, adrenal glands, left kidney and bowel. Musculoskeletal: No acute osseous abnormality identified. Review of the MIP images confirms the above findings. IMPRESSION: 1. Negative for acute pulmonary embolus. 2. Small layering pleural effusions with compressive atelectasis at both lung bases. But no other acute pulmonary process. 3. Stable cardiomegaly. Contrast reflux into the hepatic veins and hepatic veins compatible with a degree of right heart failure. 4. Aortic  Atherosclerosis (ICD10-I70.0). Electronically Signed   By: Odessa Fleming M.D.   On: 09/10/2019 21:00   DG CHEST PORT 1 VIEW  Result Date: 09/13/2019 CLINICAL DATA:  Congestive heart failure. EXAM: PORTABLE CHEST 1 VIEW COMPARISON:  September 10, 2019. FINDINGS: Stable cardiomediastinal silhouette. No pneumothorax or pleural effusion is noted. Lungs are clear. Bony thorax is unremarkable. IMPRESSION: No active disease. Electronically Signed   By: Lupita Raider M.D.   On: 09/13/2019 12:55   DG Chest Portable 1 View  Result Date: 09/10/2019 CLINICAL DATA:  Acute dyspnea, hypoxia EXAM: PORTABLE CHEST 1 VIEW COMPARISON:  05/14/2019 FINDINGS: Single frontal view of the chest demonstrates persistent enlargement the cardiac silhouette. Stable ectasia of the thoracic aorta. There is background emphysema and hyperinflation. Minimal residual retrocardiac consolidation and trace left pleural effusion remain, markedly improved since previous study. Right chest is clear. No pneumothorax. IMPRESSION: 1. Minimal residual left basilar consolidation and effusion since prior study. 2. Stable emphysema. Electronically Signed   By: Sharlet Salina M.D.   On: 09/10/2019 19:12   ECHOCARDIOGRAM COMPLETE  Result Date: 09/11/2019    ECHOCARDIOGRAM REPORT  Patient Name:   EMERLYN MEHLHOFF Date of Exam: 09/11/2019 Medical Rec #:  [id number]     Height:       61.0 in Accession #:    [id number]    Weight:       100.0 lb Date of Birth:  05-07-1923     BSA:          1.407 m Patient Age:    89 years      BP:           118/61 mmHg Patient Gender: F             HR:           70 bpm. Exam Location:  Inpatient Procedure: 2D Echo                             MODIFIED REPORT: This report was modified by Donato Schultz MD on 09/11/2019 due to EF 45-50%.  Indications:     CHF  History:         Patient has prior history of Echocardiogram examinations, most                  recent 02/03/2019. COPD, Arrythmias:Atrial Fibrillation; Risk                   Factors:Hypertension.  Sonographer:     Thurman Coyer RDCS (AE) Referring Phys:  [id number] Alessandra Bevels Diagnosing Phys: Donato Schultz MD IMPRESSIONS  1. Left ventricular ejection fraction, by estimation, is 45 to 50%. The left ventricle has mildly decreased function. The left ventricle demonstrates global hypokinesis. There is mild left ventricular hypertrophy. Left ventricular diastolic parameters are indeterminate.  2. Right ventricular systolic function is normal. The right ventricular size is normal. There is normal pulmonary artery systolic pressure. The estimated right ventricular systolic pressure is 27.6 mmHg.  3. Left atrial size was moderately dilated.  4. Right atrial size was mildly dilated.  5. The mitral valve is normal in structure. Mild mitral valve regurgitation. No evidence of mitral stenosis.  6. Tricuspid valve regurgitation is moderate.  7. The aortic valve is tricuspid. Aortic valve regurgitation is not visualized. Mild aortic valve sclerosis is present, with no evidence of aortic valve stenosis.  8. The inferior vena cava is normal in size with greater than 50% respiratory variability, suggesting right atrial pressure of 3 mmHg. FINDINGS  Left Ventricle: Left ventricular ejection fraction, by estimation, is 45 to 50%. The left ventricle has mildly decreased function. The left ventricle demonstrates global hypokinesis. The left ventricular internal cavity size was normal in size. There is  mild left ventricular hypertrophy. Left ventricular diastolic parameters are indeterminate. Right Ventricle: The right ventricular size is normal. No increase in right ventricular wall thickness. Right ventricular systolic function is normal. There is normal pulmonary artery systolic pressure. The tricuspid regurgitant velocity is 2.48 m/s, and  with an assumed right atrial pressure of 3 mmHg, the estimated right ventricular systolic pressure is 27.6 mmHg. Left Atrium: Left atrial size was moderately  dilated. Right Atrium: Right atrial size was mildly dilated. Pericardium: There is no evidence of pericardial effusion. Mitral Valve: The mitral valve is normal in structure. Normal mobility of the mitral valve leaflets. Mild mitral valve regurgitation. No evidence of mitral valve stenosis. Tricuspid Valve: The tricuspid valve is normal in structure. Tricuspid valve regurgitation is moderate . No evidence of tricuspid stenosis. Aortic Valve: The aortic valve is  tricuspid. Aortic valve regurgitation is not visualized. Mild aortic valve sclerosis is present, with no evidence of aortic valve stenosis. Pulmonic Valve: The pulmonic valve was normal in structure. Pulmonic valve regurgitation is not visualized. No evidence of pulmonic stenosis. Aorta: The aortic root is normal in size and structure. Venous: The inferior vena cava is normal in size with greater than 50% respiratory variability, suggesting right atrial pressure of 3 mmHg. IAS/Shunts: No atrial level shunt detected by color flow Doppler.  LEFT VENTRICLE PLAX 2D LVIDd:         3.20 cm LVIDs:         2.50 cm LV PW:         1.20 cm LV IVS:        0.90 cm LVOT diam:     2.00 cm LV SV:         39 LV SV Index:   28 LVOT Area:     3.14 cm  LEFT ATRIUM             Index       RIGHT ATRIUM           Index LA diam:        2.90 cm 2.06 cm/m  RA Area:     19.40 cm LA Vol (A2C):   59.0 ml 41.94 ml/m RA Volume:   44.80 ml  31.84 ml/m LA Vol (A4C):   59.5 ml 42.29 ml/m LA Biplane Vol: 62.3 ml 44.28 ml/m  AORTIC VALVE LVOT Vmax:   53.90 cm/s LVOT Vmean:  40.300 cm/s LVOT VTI:    0.124 m  AORTA Ao Root diam: 2.90 cm Ao Asc diam:  3.10 cm TRICUSPID VALVE TR Peak grad:   24.6 mmHg TR Vmax:        248.00 cm/s  SHUNTS Systemic VTI:  0.12 m Systemic Diam: 2.00 cm Donato Schultz MD Electronically signed by Donato Schultz MD Signature Date/Time: 09/11/2019/4:01:51 PM    Final (Updated)    DG Femur Portable Min 2 Views Right  Result Date: 09/10/2019 CLINICAL DATA:  Chronic right  femoral fracture. Assess interval healing. EXAM: RIGHT FEMUR PORTABLE 2 VIEW COMPARISON:  05/06/2019 FINDINGS: Surgical changes right total hip arthroplasty are again identified. An oblique fracture of the proximal femoral diaphysis surrounding the distal femoral stem component is again identified and now demonstrates bridging callus between the fracture fragments in keeping with interval healing. There is residual deformity with minimal posterior angulation and approximately 1/2 shaft with lateral displacement of the distal femoral shaft. Extensive heterotopic ossification surrounding the femoral stem component of the arthroplasty involving the proximal femur in the region of the right hip is again identified. Marked protrusio is also, again, identified with 1 of the acetabular screws extending into the medial pelvis, unchanged from prior examination. Advanced vascular calcifications are seen within the soft tissues of the right lower extremity. IMPRESSION: Interval healing now with bridging callus between the fracture fragments. Mild residual deformity. 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 Electronically Signed   By: Helyn Numbers MD   On: 09/10/2019 20:11     Antimicrobials:   NONE    Subjective: No acute evnets Has had variable HR  Objective: Vitals:   09/13/19 1000 09/13/19 1100 09/13/19 1200 09/13/19 1300  BP: (!) 145/98  137/72   Pulse: (!) 270 84 82 73  Resp: (!) Temp:   98 F (36.7 C)   TempSrc:   Axillary   SpO2:  90% 96% 100% 100%  Weight:      Height:        Intake/Output Summary (Last 24 hours) at 09/13/2019 1327 Last data filed at 09/13/2019 0400 Gross per 24 hour  Intake 100 ml  Output 300 ml  Net -200 ml   Filed Weights   09/11/19 1815 09/12/19 0331 09/13/19 0333  Weight: 45.9 kg 47.3 kg 47 kg    Examination:  General exam: Appears calm and comfortable  Respiratory system: Clear to auscultation. Respiratory  effort normal. Cardiovascular system: iRREG IRREG, variable rate Gastrointestinal system: Abdomen is nondistended, soft and nontender. No organomegaly or masses felt. Normal bowel sounds heard. Central nervous system: Alert . Globally weak Extremities: WWP, THIN Skin: No rashes, lesions or ulcers Psychiatry: Judgement and insight appear limted by cog deficit Mood & affect appropriate.     Data Reviewed: I have personally reviewed following labs and imaging studies  CBC: Recent Labs  Lab 09/10/19 1736 09/11/19 0505 09/12/19 0244 09/13/19 0719  WBC 9.2 8.5 8.2 6.9  NEUTROABS 5.2 5.1 4.2 3.9  HGB 12.3 11.6* 11.5* 11.7*  HCT 38.1 36.7 36.2 37.2  MCV 97.7 96.6 97.1 98.7  PLT 259 258 229 249   Basic Metabolic Panel: Recent Labs  Lab 09/10/19 1736 09/11/19 0505 09/12/19 0244 09/13/19 0719  NA 139 136 136 136  K 4.2 3.8 4.0 3.5  CL 102 101 100 99  CO2 26 26 26 27   GLUCOSE 97 103* 95 96  BUN 22 23 28* 23  CREATININE 0.81 0.81 0.89 0.85  CALCIUM 9.2 9.0 8.9 8.4*  MG  --  1.9  --   --    GFR: Estimated Creatinine Clearance: 30 mL/min (by C-G formula based on SCr of 0.85 mg/dL). Liver Function Tests: Recent Labs  Lab 09/10/19 1736 09/11/19 0505 09/12/19 0244  AST 22 23 25   ALT 12 11 11   ALKPHOS 75 65 57  BILITOT 0.8 0.7 0.8  PROT 7.7 7.3 7.3  ALBUMIN 3.8 3.7 3.6   No results for input(s): LIPASE, AMYLASE in the last 168 hours. No results for input(s): AMMONIA in the last 168 hours. Coagulation Profile: No results for input(s): INR, PROTIME in the last 168 hours. Cardiac Enzymes: No results for input(s): CKTOTAL, CKMB, CKMBINDEX, TROPONINI in the last 168 hours. BNP (last 3 results) No results for input(s): PROBNP in the last 8760 hours. HbA1C: No results for input(s): HGBA1C in the last 72 hours. CBG: No results for input(s): GLUCAP in the last 168 hours. Lipid Profile: No results for input(s): CHOL, HDL, LDLCALC, TRIG, CHOLHDL, LDLDIRECT in the last 72  hours. Thyroid Function Tests: Recent Labs    09/11/19 0505 09/12/19 1208  TSH 5.077*  --   FREET4  --  0.91   Anemia Panel: No results for input(s): VITAMINB12, FOLATE, FERRITIN, TIBC, IRON, RETICCTPCT in the last 72 hours. Sepsis Labs: Recent Labs  Lab 09/10/19 1736  LATICACIDVEN 1.8    Recent Results (from the past 240 hour(s))  Culture, blood (routine x 2)     Status: None (Preliminary result)   Collection Time: 09/10/19  5:36 PM   Specimen: BLOOD RIGHT FOREARM  Result Value Ref Range Status   Specimen Description   Final    BLOOD RIGHT FOREARM Performed at [facility], [address] [place], [place]    Special Requests   Final    BOTTLES DRAWN AEROBIC AND ANAEROBIC Blood Culture adequate volume Performed at [facility], [address] [place], [place] [address]    Culture   Final    NO GROWTH 2 DAYS Performed at [facility], [address], [place], [place] [address]    Report Status PENDING  Incomplete  Culture, blood (routine x 2)     Status: None (Preliminary result)   Collection Time: 09/10/19  5:37 PM   Specimen: BLOOD  Result Value Ref Range Status   Specimen Description   Final    BLOOD LEFT ANTECUBITAL Performed at [facility], [address] [place], [place] [address]    Special Requests   Final    BOTTLES DRAWN AEROBIC AND ANAEROBIC Blood Culture results may not be optimal due to an excessive volume of blood received in culture bottles Performed at [facility], [address], [place], [place] [address]    Culture   Final    NO GROWTH 2 DAYS Performed at [facility], [address], [place], [place] [address]    Report Status PENDING  Incomplete  SARS Coronavirus 2 by RT PCR (hospital order, performed in [facility] hospital lab) Nasopharyngeal Nasopharyngeal Swab     Status: None   Collection Time: 09/10/19  5:41 PM   Specimen:  Nasopharyngeal Swab  Result Value Ref Range Status   SARS Coronavirus 2 NEGATIVE NEGATIVE Final    Comment: (NOTE) SARS-CoV-2 target nucleic acids are NOT DETECTED.  The SARS-CoV-2 RNA is generally detectable in upper and lower respiratory specimens during the acute phase of infection. The lowest concentration of SARS-CoV-2 viral copies this assay can detect is 250 copies / mL. A negative result does not preclude SARS-CoV-2 infection and should not be used as the sole basis for treatment or other patient management decisions.  A negative result may occur with improper specimen collection / handling, submission of specimen other than nasopharyngeal swab, presence of viral mutation(s) within the areas targeted by this assay, and inadequate number of viral copies (<250 copies / mL). A negative result must be combined with clinical observations, patient history, and epidemiological information.  Fact Sheet for Patients:   BoilerBrush.com.cy  Fact Sheet for Healthcare Providers: https://pope.com/  This test is not yet approved or  cleared by the [place] FDA and has been authorized for detection and/or diagnosis of SARS-CoV-2 by FDA under an Emergency Use Authorization (EUA).  This EUA will remain in effect (meaning this test can be used) for the duration of the COVID-19 declaration under Section 564(b)(1) of the Act, 21 U.S.C. section 360bbb-3(b)(1), unless the authorization is terminated or revoked sooner.  Performed at [facility], [address] [place], [place] [address]   MRSA PCR Screening     Status: None   Collection Time: 09/11/19  6:07 PM   Specimen: Nasal Mucosa; Nasopharyngeal  Result Value Ref Range Status   MRSA by PCR NEGATIVE NEGATIVE Final    Comment:        The GeneXpert MRSA Assay (FDA approved for NASAL specimens only), is one component of a comprehensive MRSA  colonization surveillance program. It is not intended to diagnose MRSA infection nor to guide or monitor treatment for MRSA infections. Performed at [facility], [address], [place], [place] [address]          Radiology Studies: DG CHEST PORT 1 VIEW  Result Date: 09/13/2019 CLINICAL DATA:  Congestive heart failure. EXAM: PORTABLE CHEST 1 VIEW COMPARISON:  September 10, 2019. FINDINGS: Stable cardiomediastinal silhouette. No  pneumothorax or pleural effusion is noted. Lungs are clear. Bony thorax is unremarkable. IMPRESSION: No active disease. Electronically Signed   By: Lupita Raider M.D.   On: 09/13/2019 12:55   ECHOCARDIOGRAM COMPLETE  Result Date: 09/11/2019    ECHOCARDIOGRAM REPORT   Patient Name:   ANNALIESE SAEZ Date of Exam: 09/11/2019 Medical Rec #:  [id number]     Height:       61.0 in Accession #:    [id number]    Weight:       100.0 lb Date of Birth:  June 13, 1923     BSA:          1.407 m Patient Age:    89 years      BP:           118/61 mmHg Patient Gender: F             HR:           70 bpm. Exam Location:  Inpatient Procedure: 2D Echo                             MODIFIED REPORT: This report was modified by Donato Schultz MD on 09/11/2019 due to EF 45-50%.  Indications:     CHF  History:         Patient has prior history of Echocardiogram examinations, most                  recent 02/03/2019. COPD, Arrythmias:Atrial Fibrillation; Risk                  Factors:Hypertension.  Sonographer:     Thurman Coyer RDCS (AE) Referring Phys:  [id number] Alessandra Bevels Diagnosing Phys: Donato Schultz MD IMPRESSIONS  1. Left ventricular ejection fraction, by estimation, is 45 to 50%. The left ventricle has mildly decreased function. The left ventricle demonstrates global hypokinesis. There is mild left ventricular hypertrophy. Left ventricular diastolic parameters are indeterminate.  2. Right ventricular systolic function is normal. The right ventricular size is normal. There is  normal pulmonary artery systolic pressure. The estimated right ventricular systolic pressure is 27.6 mmHg.  3. Left atrial size was moderately dilated.  4. Right atrial size was mildly dilated.  5. The mitral valve is normal in structure. Mild mitral valve regurgitation. No evidence of mitral stenosis.  6. Tricuspid valve regurgitation is moderate.  7. The aortic valve is tricuspid. Aortic valve regurgitation is not visualized. Mild aortic valve sclerosis is present, with no evidence of aortic valve stenosis.  8. The inferior vena cava is normal in size with greater than 50% respiratory variability, suggesting right atrial pressure of 3 mmHg. FINDINGS  Left Ventricle: Left ventricular ejection fraction, by estimation, is 45 to 50%. The left ventricle has mildly decreased function. The left ventricle demonstrates global hypokinesis. The left ventricular internal cavity size was normal in size. There is  mild left ventricular hypertrophy. Left ventricular diastolic parameters are indeterminate. Right Ventricle: The right ventricular size is normal. No increase in right ventricular wall thickness. Right ventricular systolic function is normal. There is normal pulmonary artery systolic pressure. The tricuspid regurgitant velocity is 2.48 m/s, and  with an assumed right atrial pressure of 3 mmHg, the estimated right ventricular systolic pressure is 27.6 mmHg. Left Atrium: Left atrial size was moderately dilated. Right Atrium: Right atrial size was mildly dilated. Pericardium: There is no evidence of pericardial effusion. Mitral Valve: The mitral valve is  normal in structure. Normal mobility of the mitral valve leaflets. Mild mitral valve regurgitation. No evidence of mitral valve stenosis. Tricuspid Valve: The tricuspid valve is normal in structure. Tricuspid valve regurgitation is moderate . No evidence of tricuspid stenosis. Aortic Valve: The aortic valve is tricuspid. Aortic valve regurgitation is not visualized. Mild  aortic valve sclerosis is present, with no evidence of aortic valve stenosis. Pulmonic Valve: The pulmonic valve was normal in structure. Pulmonic valve regurgitation is not visualized. No evidence of pulmonic stenosis. Aorta: The aortic root is normal in size and structure. Venous: The inferior vena cava is normal in size with greater than 50% respiratory variability, suggesting right atrial pressure of 3 mmHg. IAS/Shunts: No atrial level shunt detected by color flow Doppler.  LEFT VENTRICLE PLAX 2D LVIDd:         3.20 cm LVIDs:         2.50 cm LV PW:         1.20 cm LV IVS:        0.90 cm LVOT diam:     2.00 cm LV SV:         39 LV SV Index:   28 LVOT Area:     3.14 cm  LEFT ATRIUM             Index       RIGHT ATRIUM           Index LA diam:        2.90 cm 2.06 cm/m  RA Area:     19.40 cm LA Vol (A2C):   59.0 ml 41.94 ml/m RA Volume:   44.80 ml  31.84 ml/m LA Vol (A4C):   59.5 ml 42.29 ml/m LA Biplane Vol: 62.3 ml 44.28 ml/m  AORTIC VALVE LVOT Vmax:   53.90 cm/s LVOT Vmean:  40.300 cm/s LVOT VTI:    0.124 m  AORTA Ao Root diam: 2.90 cm Ao Asc diam:  3.10 cm TRICUSPID VALVE TR Peak grad:   24.6 mmHg TR Vmax:        248.00 cm/s  SHUNTS Systemic VTI:  0.12 m Systemic Diam: 2.00 cm Donato Schultz MD Electronically signed by Donato Schultz MD Signature Date/Time: 09/11/2019/4:01:51 PM    Final (Updated)         Scheduled Meds: . amiodarone  200 mg Oral BID  . amLODipine  5 mg Oral Daily  . apixaban  2.5 mg Oral BID  . Chlorhexidine Gluconate Cloth  6 each Topical Daily  . citalopram  20 mg Oral Daily  . furosemide  20 mg Intravenous Daily  . galantamine  8 mg Oral Q breakfast  . LORazepam  0.5 mg Oral Daily  . mouth rinse  15 mL Mouth Rinse BID  . metoprolol tartrate  50 mg Oral BID  . potassium chloride SA  20 mEq Oral QODAY  . ziprasidone  20 mg Oral Daily   Continuous Infusions: . diltiazem (CARDIZEM) infusion Stopped (09/11/19 2119)     LOS: 2 days    Time spent: 35  min    Burke Keels, MD [facility]  If 7PM-7AM, please contact night-coverage  09/13/2019, 1:27 PM

## 2019-09-14 ENCOUNTER — Encounter (HOSPITAL_COMMUNITY): Payer: Self-pay | Admitting: Internal Medicine

## 2019-09-14 DIAGNOSIS — J9601 Acute respiratory failure with hypoxia: Secondary | ICD-10-CM

## 2019-09-14 DIAGNOSIS — I480 Paroxysmal atrial fibrillation: Secondary | ICD-10-CM

## 2019-09-14 DIAGNOSIS — I4891 Unspecified atrial fibrillation: Secondary | ICD-10-CM

## 2019-09-14 LAB — CBC WITH DIFFERENTIAL/PLATELET
Abs Immature Granulocytes: 0.03 10*3/uL (ref 0.00–0.07)
Basophils Absolute: 0.1 10*3/uL (ref 0.0–0.1)
Basophils Relative: 1 %
Eosinophils Absolute: 0.1 10*3/uL (ref 0.0–0.5)
Eosinophils Relative: 1 %
HCT: 36.7 % (ref 36.0–46.0)
Hemoglobin: 11.6 g/dL — ABNORMAL LOW (ref 12.0–15.0)
Immature Granulocytes: 0 %
Lymphocytes Relative: 32 %
Lymphs Abs: 2.5 10*3/uL (ref 0.7–4.0)
MCH: 31.3 pg (ref 26.0–34.0)
MCHC: 31.6 g/dL (ref 30.0–36.0)
MCV: 98.9 fL (ref 80.0–100.0)
Monocytes Absolute: 1 10*3/uL (ref 0.1–1.0)
Monocytes Relative: 13 %
Neutro Abs: 4.2 10*3/uL (ref 1.7–7.7)
Neutrophils Relative %: 53 %
Platelets: 248 10*3/uL (ref 150–400)
RBC: 3.71 MIL/uL — ABNORMAL LOW (ref 3.87–5.11)
RDW: 15.7 % — ABNORMAL HIGH (ref 11.5–15.5)
WBC: 7.8 10*3/uL (ref 4.0–10.5)
nRBC: 0 % (ref 0.0–0.2)

## 2019-09-14 LAB — BASIC METABOLIC PANEL
Anion gap: 9 (ref 5–15)
BUN: 29 mg/dL — ABNORMAL HIGH (ref 8–23)
CO2: 26 mmol/L (ref 22–32)
Calcium: 8.4 mg/dL — ABNORMAL LOW (ref 8.9–10.3)
Chloride: 100 mmol/L (ref 98–111)
Creatinine, Ser: 1.04 mg/dL — ABNORMAL HIGH (ref 0.44–1.00)
GFR calc Af Amer: 53 mL/min — ABNORMAL LOW (ref >60)
GFR calc non Af Amer: 46 mL/min — ABNORMAL LOW (ref >60)
Glucose, Bld: 103 mg/dL — ABNORMAL HIGH (ref 70–99)
Potassium: 4.2 mmol/L (ref 3.5–5.1)
Sodium: 135 mmol/L (ref 135–145)

## 2019-09-14 MED ORDER — METOPROLOL TARTRATE 25 MG PO TABS
75.0000 mg | ORAL_TABLET | Freq: Two times a day (BID) | ORAL | Status: DC
  Administered 2019-09-14 – 2019-09-15 (×3): 75 mg via ORAL
  Filled 2019-09-14 (×3): qty 3

## 2019-09-14 MED ORDER — FUROSEMIDE 20 MG PO TABS: 20.0000 mg | ORAL_TABLET | ORAL | Status: DC

## 2019-09-14 MED ORDER — DILTIAZEM HCL 30 MG PO TABS: 30.0000 mg | ORAL_TABLET | Freq: Three times a day (TID) | ORAL | Status: DC

## 2019-09-14 NOTE — Consult Note (Addendum)
Cardiology Consultation:   Patient ID: Yolanda Baker MRN: [id number]; DOB: 1924/02/14  Admit date: 09/10/2019 Date of Consult: 09/14/2019  Primary Care Provider: Rodrigo Ran, MD [facility] Cardiologist: Chilton Si, MD  [facility] Electrophysiologist:  None    Patient Profile:   Yolanda Baker is a 86 y.o. female with a hx of paroxysmal atrial fibrillation/flutter, chronic diastolic CHF, HTN, stroke, dementia, falls, anxiety, osteoporosis, mild intermittent anemia by labs who is being seen today for the evaluation of atrial fib and CHF at the request of Dr. Lurene Shadow.  History of Present Illness:   Per chart review, Yolanda Baker was first diagnosed with atrial fibrillation/flutter in 2015, initially not anticoagulated due to advanced age and frequent falls.  She had a stroke treated with TPA in 2016 and was subsequently placed on Eliquis for anticoagulation. She was seen by our cardiology team 01/2019 for recurrent atrial fib. This appears to have been managed with a rate control strategy. She was admitted 04/2019 for hip fracture managed conservatively with brace and PT, was in NSR at that time (sinus brady - 50s). Has been followed via telemedicine with cardiology with similar rates recently.  She was admitted 09/10/2019 with increasing SOB. She was found to be hypoxic and tachycardic in AFib RVR. CTA was negative for PE but did show small layering pleural effusions with compressive atelectasis, no other acute pulmonary process, cardiomegaly, aortic atherosclerosis, and contrast reflux into the hepatic veins and hepatic veins compatible with a degree of right heart failure. Covid test was negative. She was treated with IV Lasix and rate control. hsTroponin negative. 2D Echo 09/11/19 showed mildly decreased LVEF at 45-50%, global hyopkinesis, normal PASP, moderate LAE, mild RAE, mild MR. Family requested cardiology consult. Labs otherwise notable for mild bump in BUN/Cr this AM (29/1.04),  mild stable anemia at 11.6, normal albumin, negative troponin, slightly elevated TSH at 5.077 with normal fT4. She is -1.6L, weights variable 101.2->104.3->103.6->103.6. F/u CXR NAD. Dr. Mayford Knife called to relay that she had spoken with team yesterday and recommended initiation of oral amiodarone so she is on 200mg  BID, as well as amlodipine 5mg  daily, metoprolol 50mg  BID and initially written to start diltiazem 30mg  q8hr today.   She is still in atrial fib around 80-100bpm. She states she feels much better and denies any CP, palpitations, or SOB. She is A+Ox3 today.  Past Medical History:  Diagnosis Date  . Anemia   . Anxiety   . Arthritis    neck  . Bruises easily   . Chronic diastolic CHF (congestive heart failure) (HCC)   . COPD (chronic obstructive pulmonary disease) (HCC)   . Dementia (HCC)   . Depression   . Dysrhythmia    hx A-FLUTTER after hip surg April 2015- take metoprolol  . Eczema   . Falls   . Hip fracture (HCC)   . Hip joint pain    RT  . Hypertension   . Osteoporosis   . Persistent atrial fibrillation (HCC) 08/04/2019  . Pneumonia    august 2017  . Stroke [facility])     Past Surgical History:  Procedure Laterality Date  . ABDOMINAL HYSTERECTOMY    . FRACTURE SURGERY Right 06/05/2013   hip  . HIP ARTHROPLASTY Right 10/03/2013   Procedure: CONVERSION OF PREVIOUS RIGHT HIP SURGERY TO A RIGHT TOTAL HIP ARTHROPLASTY;  Surgeon: September 2017, MD;  Location: [facility] ORS;  Service: Orthopedics;  Laterality: Right;  . INTRAMEDULLARY (IM) NAIL INTERTROCHANTERIC Right 06/05/2013   Procedure:  INTRAMEDULLARY (IM) NAIL INTERTROCHANTRIC;  Surgeon: Loanne Drilling, MD;  Location: [facility] ORS;  Service: Orthopedics;  Laterality: Right;  . JOINT REPLACEMENT     left hip  . left hip Left 2008  . LEG SURGERY Right    x2     Home Medications:  Prior to Admission medications   Medication Sig Start Date End Date Taking? Authorizing Provider  acetaminophen (TYLENOL) 500 MG tablet Take 1  tablet (500 mg total) by mouth 2 (two) times daily as needed for mild pain or moderate pain. 03/02/15  Yes Leta Baptist, MD  amLODipine (NORVASC) 5 MG tablet Take 5 mg by mouth daily.   Yes [provider]  apixaban (ELIQUIS) 2.5 MG TABS tablet Take 1 tablet (2.5 mg total) by mouth 2 (two) times daily. 07/01/14  Yes Layne Benton, NP  chlorhexidine (PERIDEX) 0.12 % solution Use as directed 15 mLs in the mouth or throat 2 (two) times daily. Swish and spit   Yes [provider]  Chlorpheniramine-APAP (CORICIDIN) 2-325 MG TABS Take 1 tablet by mouth 2 (two) times daily as needed (congestion).   Yes [provider]  citalopram (CELEXA) 20 MG tablet TAKE ONE TABLET BY MOUTH DAILY NO REFILLS** COURTESY FILL 08/10/19 Patient taking differently: Take 20 mg by mouth daily.  08/10/19  Yes Cottle, Steva Ready., MD  furosemide (LASIX) 20 MG tablet Take 1 tablet (20 mg total) by mouth every Monday, Wednesday, and Friday. 02/09/19  Yes Albertine Grates, MD  galantamine (RAZADYNE ER) 8 MG 24 hr capsule Take 8 mg by mouth daily with breakfast.   Yes [provider]  HYDROcodone-acetaminophen (NORCO/VICODIN) 5-325 MG tablet Take 1-2 tablets by mouth every 6 (six) hours as needed for moderate pain. 05/10/19  Yes Pokhrel, Laxman, MD  loperamide (IMODIUM) 2 MG capsule Take 2 mg by mouth as needed for diarrhea or loose stools.  07/14/19  Yes [provider]  LORazepam (ATIVAN) 0.5 MG tablet Take 1 tablet (0.5 mg total) by mouth 2 (two) times daily. Patient taking differently: Take 0.5 mg by mouth daily.  02/04/19  Yes Swayze, Ava, DO  metoprolol tartrate (LOPRESSOR) 25 MG tablet Take 25 mg by mouth 2 (two) times daily. 09/06/19  Yes [provider]  Multiple Vitamins-Minerals (CERTAVITE SENIOR/ANTIOXIDANT PO) Take 1 tablet by mouth daily.    Yes [provider]  potassium chloride SA (K-DUR,KLOR-CON) 20 MEQ tablet Take 20 mEq by mouth every other day.   Yes [provider]  psyllium (REGULOID) 0.52 g capsule Take 0.52 g by mouth daily.   Yes [provider]  simvastatin (ZOCOR) 10 MG tablet Take 1 tablet (10 mg total) by mouth daily at 6 PM. 07/01/14  Yes Biby, Jani Files, NP  STIOLTO RESPIMAT 2.5-2.5 MCG/ACT AERS Inhale 2 puffs into the lungs daily. 04/17/19  Yes [provider]  ziprasidone (GEODON) 20 MG capsule TAKE ONE CAPSULE BY MOUTH DAILY IN THE AFTERNOON **DO NOT CRUSH** **NO REFILLS** Patient taking differently: Take 20 mg by mouth daily.  09/10/19  Yes Cottle, Steva Ready., MD  citalopram (CELEXA) 10 MG tablet Take 20 mg by mouth daily.  01/03/15   [provider]  ipratropium-albuterol (DUONEB) 0.5-2.5 (3) MG/3ML SOLN Administer by nebulizer every 6 hours as needed for shortness of breath. Patient not taking: Reported on 08/04/2019 05/14/19   Molpus, Jonny Ruiz, MD  methocarbamol (ROBAXIN) 500 MG tablet Take 1 tablet (500 mg total) by mouth every 6 (six) hours as needed  for muscle spasms. Patient not taking: Reported on 08/04/2019 05/10/19   Joycelyn Das, MD  metoprolol tartrate (LOPRESSOR) 100 MG tablet Take 25 mg by mouth 2 (two) times daily.  Patient not taking: Reported on 09/10/2019 05/04/19   [provider]  metoprolol tartrate (LOPRESSOR) 50 MG tablet Take 1 tablet (50 mg total) by mouth 2 (two) times daily. 09/12/19 10/12/19  Spongberg, Susy Frizzle, MD  Polyethyl Glycol-Propyl Glycol (SYSTANE) 0.4-0.3 % SOLN Place 1 drop into both eyes every hour as needed (dry eyes/irritation).    [provider]  saline (AYR) GEL Place 1 application into both nostrils at bedtime. Patient not taking: Reported on 09/10/2019    [provider]  sodium chloride (OCEAN) 0.65 % SOLN nasal spray Place 3 sprays into both nostrils 4 (four) times daily. Patient not taking: Reported on 09/10/2019    [provider]  ziprasidone (GEODON) 60 MG capsule Take 40 mg by mouth daily. 40mg  in the afternoon Patient not  taking: Reported on 09/10/2019 06/08/15   [provider]    Inpatient Medications: Scheduled Meds: . amiodarone  200 mg Oral BID  . amLODipine  5 mg Oral Daily  . apixaban  2.5 mg Oral BID  . Chlorhexidine Gluconate Cloth  6 each Topical Daily  . citalopram  20 mg Oral Daily  . galantamine  8 mg Oral Q breakfast  . LORazepam  0.5 mg Oral Daily  . mouth rinse  15 mL Mouth Rinse BID  . metoprolol tartrate  50 mg Oral BID  . potassium chloride SA  20 mEq Oral QODAY  . ziprasidone  20 mg Oral Daily   Continuous Infusions:  PRN Meds: ondansetron **OR** ondansetron (ZOFRAN) IV  Allergies:    Allergies  Allergen Reactions  . Flagyl [Metronidazole]     Fingers get "numb"    Social History:   Social History   Socioeconomic History  . Marital status: Widowed    Spouse name: Not on file  . Number of children: 3  . Years of education: 86yr colleg  . Highest education level: Not on file  Occupational History  . Occupation: Retired   Tobacco Use  . Smoking status: Never Smoker  . Smokeless tobacco: Never Used  Vaping Use  . Vaping Use: Never used  Substance and Sexual Activity  . Alcohol use: No    Alcohol/week: 0.0 standard drinks  . Drug use: No  . Sexual activity: Never  Other Topics Concern  . Not on file  Social History Narrative   Drinks about 3 cups of coffee a day    Social Determinants of Health   Financial Resource Strain:   . Difficulty of Paying Living Expenses:   Food Insecurity:   . Worried About 0yr in the Last Year:   . Programme researcher, broadcasting/film/video in the Last Year:   Transportation Needs:   . Barista (Medical):   Freight forwarder Lack of Transportation (Non-Medical):   Physical Activity:   . Days of Exercise per Week:   . Minutes of Exercise per Session:   Stress:   . Feeling of Stress :   Social Connections:   . Frequency of Communication with Friends and Family:   . Frequency of Social Gatherings with Friends and Family:   .  Attends Religious Services:   . Active Member of Clubs or Organizations:   . Attends [employer] Meetings:   Banker Marital Status:   Intimate Partner Violence:   .  Fear of Current or Ex-Partner:   . Emotionally Abused:   [employer] Physically Abused:   . Sexually Abused:     Family History:    Family History  Problem Relation Age of Onset  . Heart attack Father   . Hypertension Father   . Hypertension Sister   . Stroke Sister   . Hypertension Brother      ROS:  Please see the history of present illness.   All other ROS reviewed and negative.     Physical Exam/Data:   Vitals:   09/14/19 0400 09/14/19 0500 09/14/19 0600 09/14/19 0800  BP: 133/67  133/65 (!) 138/91  Pulse: 80  (!) 29 84  Resp: (!) 22  14 (!) 21  Temp:    98.1 F (36.7 C)  TempSrc:    Oral  SpO2: 100%  100% 100%  Weight:  47 kg    Height:        Intake/Output Summary (Last 24 hours) at 09/14/2019 0855 Last data filed at 09/14/2019 0800 Gross per 24 hour  Intake 800 ml  Output 1200 ml  Net -400 ml   Last 3 Weights 09/14/2019 09/13/2019 09/12/2019  Weight (lbs) 103 lb 9.9 oz 103 lb 9.9 oz 104 lb 4.4 oz  Weight (kg) 47 kg 47 kg 47.3 kg     Body mass index is 18.35 kg/m.  General: Frail appearing elderly WF, in no acute distress. Head: Normocephalic, atraumatic, sclera non-icteric, no xanthomas, nares are without discharge. Neck: Negative for carotid bruits. JVP not elevated. Lungs: Clear bilaterally to auscultation without wheezes, rales, or rhonchi. Breathing is unlabored. Heart: Irregularly irregular, S1 S2 without murmurs, rubs, or gallops.  Abdomen: Soft, non-tender, non-distended with normoactive bowel sounds. No rebound/guarding. Extremities: No clubbing or cyanosis. No edema. Distal pedal pulses are 2+ and equal bilaterally. Neuro: Alert and oriented X 3. Moves all extremities spontaneously. Psych:  Responds to questions appropriately with a normal affect.   EKG:  The EKG was personally  reviewed and demonstrates:  Poor quality due to artifact, appears to be probable atrial fibrillation 100bpm without acute STT changes  Telemetry:  Telemetry was personally reviewed and demonstrates:  Atrial fib rates 80-100 presently  Relevant CV Studies: 2D Echo 09/11/19  1. Left ventricular ejection fraction, by estimation, is 45 to 50%. The  left ventricle has mildly decreased function. The left ventricle  demonstrates global hypokinesis. There is mild left ventricular  hypertrophy. Left ventricular diastolic parameters  are indeterminate.  2. Right ventricular systolic function is normal. The right ventricular  size is normal. There is normal pulmonary artery systolic pressure. The  estimated right ventricular systolic pressure is 27.6 mmHg.  3. Left atrial size was moderately dilated.  4. Right atrial size was mildly dilated.  5. The mitral valve is normal in structure. Mild mitral valve  regurgitation. No evidence of mitral stenosis.  6. Tricuspid valve regurgitation is moderate.  7. The aortic valve is tricuspid. Aortic valve regurgitation is not  visualized. Mild aortic valve sclerosis is present, with no evidence of  aortic valve stenosis.  8. The inferior vena cava is normal in size with greater than 50%  respiratory variability, suggesting right atrial pressure of 3 mmHg.   Laboratory Data:  High Sensitivity Troponin:   Recent Labs  Lab 09/10/19 1736 09/10/19 2001 09/11/19 0505  TROPONINIHS 10 5 12      Chemistry Recent Labs  Lab 09/12/19 0244 09/13/19 0719 09/14/19 0402  NA 136 136 135  K 4.0 3.5 4.2  CL 100 99 100  CO2 GLUCOSE 95 96 103*  BUN 28* 23 29*  CREATININE 0.89 0.85 1.04*  CALCIUM 8.9 8.4* 8.4*  GFRNONAA 55* 59* 46*  GFRAA >60 >60 53*  ANIONGAP Recent Labs  Lab 09/10/19 1736 09/11/19 0505 09/12/19 0244  PROT 7.7 7.3 7.3  ALBUMIN 3.8 3.7 3.6  AST ALT ALKPHOS 75 65 57  BILITOT 0.8 0.7  0.8   Hematology Recent Labs  Lab 09/12/19 0244 09/13/19 0719 09/14/19 0402  WBC 8.2 6.9 7.8  RBC 3.73* 3.77* 3.71*  HGB 11.5* 11.7* 11.6*  HCT 36.2 37.2 36.7  MCV 97.1 98.7 98.9  MCH 30.8 31.0 31.3  MCHC 31.8 31.5 31.6  RDW 15.9* 15.8* 15.7*  PLT 229 249 248   BNP Recent Labs  Lab 09/10/19 1736  BNP 596.7*    DDimer  Recent Labs  Lab 09/10/19 1736  DDIMER 1.89*     Radiology/Studies:  CT Angio Chest PE W and/or Wo Contrast  Result Date: 09/10/2019 CLINICAL DATA:  86 year old female with COPD.  Hypoxia. EXAM: CT ANGIOGRAPHY CHEST WITH CONTRAST TECHNIQUE: Multidetector CT imaging of the chest was performed using the standard protocol during bolus administration of intravenous contrast. Multiplanar CT image reconstructions and MIPs were obtained to evaluate the vascular anatomy. CONTRAST:  OMNIPAQUE IOHEXOL 350 MG/ML SOLN COMPARISON:  CTA chest 02/03/2019. FINDINGS: Cardiovascular: Good contrast bolus timing in the pulmonary arterial tree. Mild respiratory motion, most pronounced at the lung bases. No focal filling defect identified in the pulmonary arteries to suggest acute pulmonary embolism. Stable cardiomegaly. No pericardial effusion. Calcified aortic atherosclerosis. Mediastinum/Nodes: Negative.  No mediastinal lymphadenopathy. Lungs/Pleura: Small bilateral layering pleural effusions. Enhancing compressive atelectasis at both lung bases. Lung volumes are not significantly changed compared to December, and the major airways remain patent. No pneumonia or other acute pulmonary opacity. Upper Abdomen: Contrast reflux into the hepatic IVC and hepatic veins. Otherwise negative visible liver. Negative visible spleen, adrenal glands, left kidney and bowel. Musculoskeletal: No acute osseous abnormality identified. Review of the MIP images confirms the above findings. IMPRESSION: 1. Negative for acute pulmonary embolus. 2. Small layering pleural effusions with compressive  atelectasis at both lung bases. But no other acute pulmonary process. 3. Stable cardiomegaly. Contrast reflux into the hepatic veins and hepatic veins compatible with a degree of right heart failure. 4. Aortic Atherosclerosis (ICD10-I70.0). Electronically Signed   By: Odessa Fleming M.D.   On: 09/10/2019 21:00   DG CHEST PORT 1 VIEW  Result Date: 09/13/2019 CLINICAL DATA:  Congestive heart failure. EXAM: PORTABLE CHEST 1 VIEW COMPARISON:  September 10, 2019. FINDINGS: Stable cardiomediastinal silhouette. No pneumothorax or pleural effusion is noted. Lungs are clear. Bony thorax is unremarkable. IMPRESSION: No active disease. Electronically Signed   By: Lupita Raider M.D.   On: 09/13/2019 12:55   DG Chest Portable 1 View  Result Date: 09/10/2019 CLINICAL DATA:  Acute dyspnea, hypoxia EXAM: PORTABLE CHEST 1 VIEW COMPARISON:  05/14/2019 FINDINGS: Single frontal view of the chest demonstrates persistent enlargement the cardiac silhouette. Stable ectasia of the thoracic aorta. There is background emphysema and hyperinflation. Minimal residual retrocardiac consolidation and trace left pleural effusion remain, markedly improved since previous study. Right chest is clear. No pneumothorax. IMPRESSION: 1. Minimal residual left basilar consolidation and effusion since prior study. 2. Stable emphysema. Electronically Signed   By: Sharlet Salina M.D.   On: 09/10/2019 19:12  ECHOCARDIOGRAM COMPLETE  Result Date: 09/11/2019    ECHOCARDIOGRAM REPORT   Patient Name:   Yolanda Baker Date of Exam: 09/11/2019 Medical Rec #:  [id number]     Height:       61.0 in Accession #:    [id number]    Weight:       100.0 lb Date of Birth:  09/20/1923     BSA:          1.407 m Patient Age:    89 years      BP:           118/61 mmHg Patient Gender: F             HR:           70 bpm. Exam Location:  Inpatient Procedure: 2D Echo                             MODIFIED REPORT: This report was modified by Donato Schultz MD on 09/11/2019 due to EF 45-50%.   Indications:     CHF  History:         Patient has prior history of Echocardiogram examinations, most                  recent 02/03/2019. COPD, Arrythmias:Atrial Fibrillation; Risk                  Factors:Hypertension.  Sonographer:     Thurman Coyer RDCS (AE) Referring Phys:  [id number] Alessandra Bevels Diagnosing Phys: Donato Schultz MD IMPRESSIONS  1. Left ventricular ejection fraction, by estimation, is 45 to 50%. The left ventricle has mildly decreased function. The left ventricle demonstrates global hypokinesis. There is mild left ventricular hypertrophy. Left ventricular diastolic parameters are indeterminate.  2. Right ventricular systolic function is normal. The right ventricular size is normal. There is normal pulmonary artery systolic pressure. The estimated right ventricular systolic pressure is 27.6 mmHg.  3. Left atrial size was moderately dilated.  4. Right atrial size was mildly dilated.  5. The mitral valve is normal in structure. Mild mitral valve regurgitation. No evidence of mitral stenosis.  6. Tricuspid valve regurgitation is moderate.  7. The aortic valve is tricuspid. Aortic valve regurgitation is not visualized. Mild aortic valve sclerosis is present, with no evidence of aortic valve stenosis.  8. The inferior vena cava is normal in size with greater than 50% respiratory variability, suggesting right atrial pressure of 3 mmHg. FINDINGS  Left Ventricle: Left ventricular ejection fraction, by estimation, is 45 to 50%. The left ventricle has mildly decreased function. The left ventricle demonstrates global hypokinesis. The left ventricular internal cavity size was normal in size. There is  mild left ventricular hypertrophy. Left ventricular diastolic parameters are indeterminate. Right Ventricle: The right ventricular size is normal. No increase in right ventricular wall thickness. Right ventricular systolic function is normal. There is normal pulmonary artery systolic pressure. The tricuspid  regurgitant velocity is 2.48 m/s, and  with an assumed right atrial pressure of 3 mmHg, the estimated right ventricular systolic pressure is 27.6 mmHg. Left Atrium: Left atrial size was moderately dilated. Right Atrium: Right atrial size was mildly dilated. Pericardium: There is no evidence of pericardial effusion. Mitral Valve: The mitral valve is normal in structure. Normal mobility of the mitral valve leaflets. Mild mitral valve regurgitation. No evidence of mitral valve stenosis. Tricuspid Valve: The tricuspid valve is normal in structure. Tricuspid valve regurgitation is  moderate . No evidence of tricuspid stenosis. Aortic Valve: The aortic valve is tricuspid. Aortic valve regurgitation is not visualized. Mild aortic valve sclerosis is present, with no evidence of aortic valve stenosis. Pulmonic Valve: The pulmonic valve was normal in structure. Pulmonic valve regurgitation is not visualized. No evidence of pulmonic stenosis. Aorta: The aortic root is normal in size and structure. Venous: The inferior vena cava is normal in size with greater than 50% respiratory variability, suggesting right atrial pressure of 3 mmHg. IAS/Shunts: No atrial level shunt detected by color flow Doppler.  LEFT VENTRICLE PLAX 2D LVIDd:         3.20 cm LVIDs:         2.50 cm LV PW:         1.20 cm LV IVS:        0.90 cm LVOT diam:     2.00 cm LV SV:         39 LV SV Index:   28 LVOT Area:     3.14 cm  LEFT ATRIUM             Index       RIGHT ATRIUM           Index LA diam:        2.90 cm 2.06 cm/m  RA Area:     19.40 cm LA Vol (A2C):   59.0 ml 41.94 ml/m RA Volume:   44.80 ml  31.84 ml/m LA Vol (A4C):   59.5 ml 42.29 ml/m LA Biplane Vol: 62.3 ml 44.28 ml/m  AORTIC VALVE LVOT Vmax:   53.90 cm/s LVOT Vmean:  40.300 cm/s LVOT VTI:    0.124 m  AORTA Ao Root diam: 2.90 cm Ao Asc diam:  3.10 cm TRICUSPID VALVE TR Peak grad:   24.6 mmHg TR Vmax:        248.00 cm/s  SHUNTS Systemic VTI:  0.12 m Systemic Diam: 2.00 cm Donato Schultz MD  Electronically signed by Donato Schultz MD Signature Date/Time: 09/11/2019/4:01:51 PM    Final (Updated)    DG Femur Portable Min 2 Views Right  Result Date: 09/10/2019 CLINICAL DATA:  Chronic right femoral fracture. Assess interval healing. EXAM: RIGHT FEMUR PORTABLE 2 VIEW COMPARISON:  05/06/2019 FINDINGS: Surgical changes right total hip arthroplasty are again identified. An oblique fracture of the proximal femoral diaphysis surrounding the distal femoral stem component is again identified and now demonstrates bridging callus between the fracture fragments in keeping with interval healing. There is residual deformity with minimal posterior angulation and approximately 1/2 shaft with lateral displacement of the distal femoral shaft. Extensive heterotopic ossification surrounding the femoral stem component of the arthroplasty involving the proximal femur in the region of the right hip is again identified. Marked protrusio is also, again, identified with 1 of the acetabular screws extending into the medial pelvis, unchanged from prior examination. Advanced vascular calcifications are seen within the soft tissues of the right lower extremity. IMPRESSION: Interval healing now with bridging callus between the fracture fragments. Mild residual deformity. 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 Electronically Signed   By: Helyn Numbers MD   On: 09/10/2019 20:11   New York Heart Association (NYHA) Functional Class NYHA Class IV on admission  Assessment and Plan:   1. Paroxysmal atrial fibrillation - outpatient dosing seemed a little confusing to follow. On 04/2019 dc summary was on metoprolol  BID. Cardiology notes at that time recommended to continue  BID although no longer on med  list. Subsequent notes 07/2019 had dose of  BID which was current admit dose. Now on metoprolol  BID and oral amiodarone  BID. She was originally ordered to start diltiazem   q8hours this AM but I see this was just discontinued by IM. Need to remember that she has a HR in the 50s when in NSR. - I will review regimen with Dr. Jacques Navy - would likely be best to stick with 2 meds (metoprolol + amiodarone) rather than introduce the third of oral diltizem. She is only on low dose amiodarone at this point - No role for DCCV at present time since her atrial fib is known to be paroxysmal - CHADSVASC 8 for CHF, HTN, age x 2, stroke x 2, vascular disease (aortic atherosclerosis) and female sex therefore continue apixaban as we are able (appropriately dosed at 2.5mg  BID)  2. Acute hypoxic respiratory failure possibly due to acute on chronic combined CHF, precipitated by recurrent atrial fib. LVEF mildly decreased at 45-50% but not significantly different than prior admission. hsTroponins negative, arguing against ACS.  - anticipate conservative management - appears euvolemic, so will hold IV Lasix and discuss transition to oral form with MD - O2 sat 100% on RA this AM  3. Acute kidney injury - baseline Cr around the 0.8 range, increased to BUN/Cr 29/1.04 today. Likely the endpoint of diuresis. - hold IV Lasix and potassium  4. Mild anemia - appears stable. - continue conservative monitoring as outpatient  5. Abnormal thyroid function - TSH slightly elevated with normal fT4. - per IM  I did tentatively request f/u in afib clinic which was granted for 09/24/19. She also has follow-up in late August with Dr. Duke Salvia.  For questions or updates, please contact [facility] Please consult [email or web address] for contact info under    Signed, Laurann Montana, PA-C  09/14/2019 8:55 AM   Patient seen and examined with Ronie Spies PA-C.  Agree as above, with the following exceptions and changes as noted below. She is feeling well overall and back to baseline. Her daughter [name] is present at the bedside for our discussion. All questions answered to the best of my ability. Gen: NAD, CV:  irregular rhythm, tachycardic, no murmurs, Lungs: clear, Abd: soft, Extrem: Warm, well perfused, no edema, Neuro/Psych: alert and oriented x 3, normal mood and affect. All available labs, radiology testing, previous records reviewed. She feels well but rates remain elevated this afternoon. She may benefit from a higher dose of metoprolol, 75 mg BID of lopressor. Might require TID dosing to keep doses reasonable. Continue amiodarone 200 mg BID until follow up. She can resume home lasix on Wednesday and maintain MWF dosing.   Can likely d/c home tomorrow from CV perspective if rates are consistently <110 bpm.  Parke Poisson, MD 09/14/19 4:01 PM

## 2019-09-14 NOTE — Evaluation (Signed)
Physical Therapy Evaluation Patient Details Name: Yolanda Baker MRN: [id number] DOB: 1923/08/21 Today's Date: 09/14/2019   History of Present Illness  Yolanda Baker is a 86 y.o. female with known history of A. fib, diastolic CHF last EF measured was 50 to 55% in December 2020, dementia recently admitted in March for hip fracture was managed conservatively with brace was brought to the ER because of increasing shortness of breath.  He does not exactly know how long patient was short of breath.  Patient was found to be hypoxic and tachycardic was brought to the ER  Clinical Impression  The patient is very pleasant, Daughter present to provide information. Patient is in ALF with caregiver days and staff at night. Patient requires to max assist to transfer to [facility] using right hip abduction brace. Patient can return to ALF. Recommend PT when allowed WB on right leg per ortho. Pt admitted with above diagnosis.  Pt currently with functional limitations due to the deficits listed below (see PT Problem List). Pt will benefit from skilled PT to increase their independence and safety with mobility to allow discharge to the venue listed below.       Follow Up Recommendations No PT follow up (return to ALF, has OT there currently per dtr)    Equipment Recommendations  None recommended by PT    Recommendations for Other Services       Precautions / Restrictions Precautions Precautions: Fall Required Braces or Orthoses: Other Brace Other Brace: right hip abd brace- per daughter, Dr. Lequita Halt states OK to remove brace when in bed Restrictions RLE Weight Bearing: Non weight bearing      Mobility  Bed Mobility Overal bed mobility: Needs Assistance Bed Mobility: Rolling;Supine to Sit;Sit to Supine Rolling: Max assist   Supine to sit: Max assist Sit to supine: Max assist   General bed mobility comments: patient able to reach for rail , max assist with legs to roll to left and right to place abd  brace  Transfers                 General transfer comment: NTrequires 2 totl to pivot  Ambulation/Gait                Stairs            Wheelchair Mobility    Modified Rankin (Stroke Patients Only)       Balance Overall balance assessment: Needs assistance Sitting-balance support: Feet supported;Bilateral upper extremity supported Sitting balance-Leahy Scale: Poor Sitting balance - Comments: support required, brace is impeding                                     Pertinent Vitals/Pain Pain Assessment: No/denies pain    Home Living Family/patient expects to be discharged to:: Assisted living               Home Equipment: Wheelchair - manual;Hospital bed Additional Comments: 2 assist to transfer to [facility] with abduction brace    Prior Function Level of Independence: Needs assistance   Gait / Transfers Assistance Needed: nonambulatory since 04/2019  ADL's / Homemaking Assistance Needed: has caregiver days, staff at night        Hand Dominance        Extremity/Trunk Assessment   Upper Extremity Assessment Upper Extremity Assessment: Overall WFL for tasks assessed    Lower Extremity Assessment Lower Extremity Assessment: RLE deficits/detail  RLE Deficits / Details: internally rotated       Communication   Communication: No difficulties  Cognition Arousal/Alertness: Awake/alert Behavior During Therapy: WFL for tasks assessed/performed Overall Cognitive Status: History of cognitive impairments - at baseline                                 General Comments: daughter present to provide info      General Comments      Exercises     Assessment/Plan    PT Assessment Patient needs continued PT services  PT Problem List Decreased strength;Decreased mobility;Decreased range of motion;Decreased coordination;Decreased safety awareness;Decreased activity tolerance;Decreased balance;Decreased knowledge of use of  DME       PT Treatment Interventions Functional mobility training;Therapeutic activities;Patient/family education    PT Goals (Current goals can be found in the Care Plan section)  Acute Rehab PT Goals Patient Stated Goal: to go back to ALF PT Goal Formulation: With patient/family Time For Goal Achievement: 09/28/19 Potential to Achieve Goals: Fair    Frequency Min 1X/week   Barriers to discharge        Co-evaluation               AM-PAC PT "6 Clicks" Mobility  Outcome Measure Help needed turning from your back to your side while in a flat bed without using bedrails?: Total Help needed moving from lying on your back to sitting on the side of a flat bed without using bedrails?: Total Help needed moving to and from a bed to a chair (including a wheelchair)?: Total Help needed standing up from a chair using your arms (e.g., wheelchair or bedside chair)?: Total Help needed to walk in hospital room?: Total Help needed climbing 3-5 steps with a railing? : Total 6 Click Score: 6    End of Session Equipment Utilized During Treatment: Other (comment) (abd brace on Rt) Activity Tolerance: Patient tolerated treatment well Patient left: in bed;with call bell/phone within reach;with bed alarm set;with family/visitor present Nurse Communication: Mobility status PT Visit Diagnosis: Unsteadiness on feet (R26.81)    Time: 1117-3567 PT Time Calculation (min) (ACUTE ONLY): 29 min   Charges:   PT Evaluation $PT Eval Low Complexity: 1 Low PT Treatments $Therapeutic Activity: 8-22 mins        Blanchard Kelch PT Acute Rehabilitation Services Pager [phone] Office [phone]   Rada Hay 09/14/2019, 12:46 PM

## 2019-09-14 NOTE — Progress Notes (Signed)
PROGRESS NOTE  Yolanda Baker OZY:[id number] DOB: 1923-03-16 DOA: 09/10/2019 PCP: Rodrigo Ran, MD  Brief History   Yolanda Baker a 86 y.o.femalewithknown history of A. fib, diastolic CHF last EF measured was 50 to 55% in December 2020, dementia recently admitted in March for hip fracture was managed conservatively with brace was brought to the ER because of increasing shortness of breath. He does not exactly know how long patient was short of breath. Patient was found to be hypoxic and tachycardic was brought to the ER.  ED Course:In the ER patient was in A. fib with RVR initially metoprolol was given but had to be started on Cardizem infusion for rate control. CT angiogram of the chest done to rule out PE was negative for pulmonary embolism but did show features concerning for CHF. Patient was given IV Lasix and admitted for CHF and A. fib with RVR. Complete metabolic panel and CBC are unremarkable troponins were negative Covid test was negative. BNP was 596.  The patient's heart rate is controlled on metoprolol and amiodarone. She has been evaluated by cardiology. Echocardiogram is pending. Will continue to monitor the patient's heart rate overnight.  Consultants  . Cardiology  Procedures  . None  Antibiotics   Anti-infectives (From admission, onward)   None    .  Subjective  The patient is resting comfortably. No new complaints.  Objective   Vitals:  Vitals:   09/14/19 1200 09/14/19 1400  BP: 114/76 118/60  Pulse: (!) 129 (!) 32  Resp: 18 (!) 24  Temp: 98.1 F (36.7 C)   SpO2: 98% 99%   Exam:  Constitutional:  . The patient is awake, alert, and oriented x 3. No acute distress. Respiratory:  . No increased work of breathing. . No wheezes, rales, or rhonchi . No tactile fremitus Cardiovascular:  . Regular rate and rhythm . No murmurs, ectopy, or gallups. . No lateral PMI. No thrills. Abdomen:  . Abdomen is soft, non-tender, non-distended . No  hernias, masses, or organomegaly . Normoactive bowel sounds.  Musculoskeletal:  . No cyanosis, clubbing, or edema Skin:  . No rashes, lesions, ulcers . palpation of skin: no induration or nodules Neurologic:  . CN 2-12 intact . Sensation all 4 extremities intact Psychiatric:  . Mental status o Mood, affect appropriate o Orientation to person, place, time  . judgment and insight appear intact  I have personally reviewed the following:   Today's Data  . Vitals, BMP, CBC  Micro Data  . Urine culture: Urine has grown E. Coli.  Cardiology Data  . Echocardiogram is pending.  Scheduled Meds: . amiodarone  200 mg Oral BID  . amLODipine  5 mg Oral Daily  . apixaban  2.5 mg Oral BID  . Chlorhexidine Gluconate Cloth  6 each Topical Daily  . citalopram  20 mg Oral Daily  . [START ON 09/16/2019] furosemide  20 mg Oral Q M,W,F  . galantamine  8 mg Oral Q breakfast  . LORazepam  0.5 mg Oral Daily  . mouth rinse  15 mL Mouth Rinse BID  . metoprolol tartrate  75 mg Oral BID  . ziprasidone  20 mg Oral Daily   Continuous Infusions:  Principal Problem:   Atrial fibrillation with RVR (HCC) Active Problems:   Dementia (HCC)   Schizoaffective disorder (HCC)   Acute on chronic congestive heart failure (HCC)   Acute on chronic diastolic heart failure (HCC)   DNR (do not resuscitate) present on admission   LOS: 3  days   A & P  A. fib with RVR precipitating cause not clear patient is on metoprolol which has been already dosed patient on Cardizem infusionat time of admission, weaned off,regular home dose of metoprololincreased to 50 bid, family requested cards consult which is pending,f/up T4, elevTSH,patient on apixaban. Cardiology has evaluated the patient. She will be monitored overnight for stability of her heart rate.  Acute on chronic diastolic CHF last EF measured in December 2020 was 50 to 55%. He did receive IV Lasix and I have placed patient on Lasix 20 mg IV daily.  Likely precipitated by A. fib with RVR. Follow intake output Daily weights.  History of dementia on Razadyne also is on Celexa Geodon Ativan for depression anxiety.  Hypertension on amlodipine and metoprolol.  Recent right hip periprosthetic fracture being managed conservatively and wears a brace.  Hyperlipidemia on statins.  I have seen and examined this patient myself. I have spent 34 minutes in her evaluation and care.  DVT prophylaxis: Eliquis CODE STATUS: DNR Family Communication: Discussed patient with her daughter in detail. No new complaints. Disposition: Patient is from ALF. Anticipate discharge to ALF. Barrier to discharge includes need to monitor patient's heart rate on telemetry overnight.  Yolanda Cuevas, DO [facility] Direct contact: see [email or web address]  7PM-7AM contact night coverage as above 09/14/2019, 4:24 PM  LOS: 3 days

## 2019-09-15 DIAGNOSIS — I4891 Unspecified atrial fibrillation: Secondary | ICD-10-CM | POA: Diagnosis not present

## 2019-09-15 LAB — CULTURE, BLOOD (ROUTINE X 2)
Culture: NO GROWTH
Culture: NO GROWTH
Special Requests: ADEQUATE

## 2019-09-15 LAB — BASIC METABOLIC PANEL
Anion gap: 10 (ref 5–15)
BUN: 27 mg/dL — ABNORMAL HIGH (ref 8–23)
CO2: 26 mmol/L (ref 22–32)
Calcium: 8.4 mg/dL — ABNORMAL LOW (ref 8.9–10.3)
Chloride: 98 mmol/L (ref 98–111)
Creatinine, Ser: 0.77 mg/dL (ref 0.44–1.00)
GFR calc Af Amer: >60 mL/min (ref >60)
GFR calc non Af Amer: >60 mL/min (ref >60)
Glucose, Bld: 97 mg/dL (ref 70–99)
Potassium: 3.9 mmol/L (ref 3.5–5.1)
Sodium: 134 mmol/L — ABNORMAL LOW (ref 135–145)

## 2019-09-15 LAB — URINE CULTURE: Culture: 80000 — AB

## 2019-09-15 MED ORDER — METOPROLOL TARTRATE 75 MG PO TABS: 75.0000 mg | ORAL_TABLET | Freq: Two times a day (BID) | ORAL | 0 refills | Status: DC

## 2019-09-15 MED ORDER — AMIODARONE HCL 200 MG PO TABS: 200.0000 mg | ORAL_TABLET | Freq: Two times a day (BID) | ORAL | 0 refills | Status: DC

## 2019-09-15 MED ORDER — CITALOPRAM HYDROBROMIDE 20 MG PO TABS: 20.0000 mg | ORAL_TABLET | Freq: Every day | ORAL | Status: DC

## 2019-09-15 MED ORDER — CEPHALEXIN 500 MG PO CAPS
500.0000 mg | ORAL_CAPSULE | Freq: Three times a day (TID) | ORAL | Status: DC
  Filled 2019-09-15: qty 1

## 2019-09-15 MED ORDER — FUROSEMIDE 20 MG PO TABS: 20.0000 mg | ORAL_TABLET | ORAL | 0 refills | Status: DC

## 2019-09-15 MED ORDER — CITALOPRAM HYDROBROMIDE 20 MG PO TABS: 20.0000 mg | ORAL_TABLET | Freq: Every day | ORAL | 0 refills | Status: DC

## 2019-09-15 NOTE — Progress Notes (Signed)
AVS discussed with patient's daughter in preparation for discharge to facility. Copy provided to both her and in discharge packet for facility to utilize. All questions answered, patient is ready for discharge when transport arrives.

## 2019-09-15 NOTE — Progress Notes (Signed)
OT Cancellation Note  Patient Details Name: Yolanda Baker MRN: [id number] DOB: Jul 17, 1923   Cancelled Treatment:    Reason Eval/Treat Not Completed: Other (comment) Pt from ALF and has DC orders to return. Per family, pt has caregivers during the day. Will defer any OT needs to SNF.   Thornell Mule, OT/L   Acute OT Clinical Specialist Acute Rehabilitation Services Pager [phone] Office [phone]   09/15/2019, 3:19 PM

## 2019-09-15 NOTE — TOC Initial Note (Addendum)
Transition of Care [facility]) - Initial/Assessment Note    Patient Details  Name: Yolanda Baker MRN: [id number] Date of Birth: 05-28-1923  [facility]) CM/SW Contact:    Golda Acre, RN Phone Number: 09/15/2019, 10:07 AM  Clinical Narrative:                 86 y.o. female from home. Hx of a.fib and current sx.  Has pcp Plan: to return home. Discharged to return to [facility].  Facility notified of dc summary and fl2 via the hub. ptar called for transport at 1520. Transport packet to floor rn. Expected Discharge Plan: Home/Self Care Barriers to Discharge: Continued Medical Work up   Patient Goals and CMS Choice Patient states their goals for this hospitalization and ongoing recovery are:: to go back to my home CMS Medicare.gov Compare Post Acute Care list provided to:: Patient    Expected Discharge Plan and Services Expected Discharge Plan: Home/Self Care   Discharge Planning Services: CM Consult   Living arrangements for the past 2 months: Apartment                                      Prior Living Arrangements/Services Living arrangements for the past 2 months: Apartment Lives with:: Self Patient language and need for interpreter reviewed:: Yes Do you feel safe going back to the place where you live?: Yes      Need for Family Participation in Patient Care: Yes (Comment) Care giver support system in place?: Yes (comment)   Criminal Activity/Legal Involvement Pertinent to Current Situation/Hospitalization: No - Comment as needed  Activities of Daily Living Home Assistive Devices/Equipment: Blood pressure cuff, Eyeglasses, Grab bars around toilet, Grab bars in shower, Hand-held shower hose, Hospital bed, Wheelchair, Scales, Brace (specify type) (right hip brace, front wheeled walker) ADL Screening (condition at time of admission) Patient's cognitive ability adequate to safely complete daily activities?: No Is the patient deaf or have  difficulty hearing?: Yes Does the patient have difficulty seeing, even when wearing glasses/contacts?: No Does the patient have difficulty concentrating, remembering, or making decisions?: Yes Patient able to express need for assistance with ADLs?: No Does the patient have difficulty dressing or bathing?: Yes Independently performs ADLs?: No Communication: Independent Dressing (OT): Dependent Is this a change from baseline?: Pre-admission baseline Grooming: Dependent Is this a change from baseline?: Pre-admission baseline Feeding: Dependent Is this a change from baseline?: Pre-admission baseline Bathing: Dependent Is this a change from baseline?: Pre-admission baseline Toileting: Dependent Is this a change from baseline?: Pre-admission baseline In/Out Bed: Dependent Is this a change from baseline?: Pre-admission baseline Walks in Home: Dependent (Patient is non weight bearing secondary to right hip fracture in march) Is this a change from baseline?: Pre-admission baseline Does the patient have difficulty walking or climbing stairs?: Yes Weakness of Legs: Both Weakness of Arms/Hands: Both  Permission Sought/Granted                  Emotional Assessment Appearance:: Appears younger than stated age Attitude/Demeanor/Rapport: Engaged Affect (typically observed): Calm Orientation: : Oriented to Self, Oriented to Place, Oriented to  Time, Oriented to Situation Alcohol / Substance Use: Not Applicable    Admission diagnosis:  Atrial fibrillation with RVR (HCC) [I48.91] Acute on chronic congestive heart failure, unspecified heart failure type [facility]) [I50.9] Patient Active Problem List   Diagnosis Date Noted  . Atrial fibrillation with RVR (HCC)  09/11/2019  . PAF (paroxysmal atrial fibrillation) (HCC) 08/04/2019  . Goals of care, counseling/discussion   . Palliative care by specialist   . Pressure injury of skin 05/07/2019  . Periprosthetic fracture around internal prosthetic  hip joint 05/06/2019  . DNR (do not resuscitate) present on admission 02/04/2019  . Acute on chronic diastolic heart failure (HCC)   . Acute on chronic congestive heart failure (HCC) 02/03/2019  . GAD (generalized anxiety disorder) 01/15/2018  . Schizoaffective disorder (HCC) 01/15/2018  . Hyperlipidemia LDL goal <70 07/01/2014  . Constipation chronic 07/01/2014  . Acute CVA (cerebrovascular accident) (HCC)   . Acute blood loss anemia 10/12/2013  . Postoperative anemia due to acute blood loss 10/04/2013  . Postop Transfusion 10/04/2013  . Closed fracture of femur with nonunion 10/03/2013  . Femur fracture, right (HCC) 10/03/2013  . Closed femur fracture (HCC) 10/03/2013  . UTI (urinary tract infection) 06/23/2013  . Dementia (HCC) 06/09/2013  . Anxiety 06/09/2013  . Atrial flutter (HCC) 06/07/2013  . Hip fracture (HCC) 06/04/2013  . Depression 06/04/2013  . Anxiety state 06/04/2013  . Essential hypertension 06/04/2013  . Closed right hip fracture (HCC) 06/04/2013   PCP:  Rodrigo Ran, MD Pharmacy:   [facility] [address], [place] - [address] [place] [address] Phone: [phone] Fax: [phone]  [facility] [place], [place] - [address] [place] [address] Phone: [phone] Fax: [phone]     Social Determinants of Health (SDOH) Interventions    Readmission Risk Interventions Readmission Risk Prevention Plan 05/07/2019 02/04/2019  Transportation Screening Complete Complete  PCP or Specialist Appt within 5-7 Days - Complete  PCP or Specialist Appt within 3-5 Days Complete -  Home Care Screening - Complete  Medication Review (RN CM) - Complete  HRI or Home Care Consult Complete -  Social Work Consult for Recovery Care Planning/Counseling Complete -  Palliative Care Screening Complete -  Medication Review [employer]) Complete -  Some recent data might be hidden

## 2019-09-15 NOTE — Discharge Summary (Addendum)
Physician Discharge Summary  Yolanda Baker PXT:[id number] DOB: 19-Dec-1923 DOA: 09/10/2019  PCP: Rodrigo Ran, MD  Admit date: 09/10/2019 Discharge date: 09/15/2019  Recommendations for Outpatient Follow-up:  Discharge to Assisted Living Facility Follow up with PCP in 7-10 days Follow up with Dr. Duke Salvia as directed. Follow up with Atrial Fibrillation Clinic. Follow up with orthopedic surgery as directed.   Follow-up Information     Fenton, Clint R, PA Follow up.   Specialty: Cardiology Why: Atrial Fib Clinic at [facility] - a follow-up appointment has been made for you to establish care in our afib clinic on September 24, 2019 at 10:30 AM. Please arrive 15 minutes early to check in. Call clinic day of appointment for garage code. Contact information: [address] [place] [address] [phone]                Discharge Diagnoses: Principal diagnosis is #1 Atrial fibrillation with RVR Dementia Schizoaffective disorder Acute on chronic diastolic heart failure Recent Rt Hip Fracture - Conservative management Stage 1 sacral pressure ulcer Malnutrition - moderate  Discharge Condition: Fair  Disposition: To Assisted Living Facility  Diet recommendation: Heart Healthy  Filed Weights   09/13/19 0333 09/14/19 0500 09/15/19 0400  Weight: 47 kg 47 kg 48.4 kg    History of present illness:  Yolanda Baker is a 86 y.o. female with known history of A. fib, diastolic CHF last EF measured was 50 to 55% in December 2020, dementia recently admitted in March for hip fracture was managed conservatively with brace was brought to the ER because of increasing shortness of breath.  He does not exactly know how long patient was short of breath.  Patient was found to be hypoxic and tachycardic was brought to the ER.   ED Course: In the ER patient was in A. fib with RVR initially metoprolol was given but had to be started on Cardizem infusion for rate control.  CT angiogram of the  chest done to rule out PE was negative for pulmonary embolism but did show features concerning for CHF.  Patient was given IV Lasix and admitted for CHF and A. fib with RVR.  Complete metabolic panel and CBC are unremarkable troponins were negative Covid test was negative.  BNP was 596.  Hospital Course:  The patient's heart rate is controlled on metoprolol and amiodarone. She has been evaluated by cardiology. Echocardiogram was performed and demonstrated an EF of 45-50% with mildly decreased function and global hypokinesis. Left ventricular diastolic parameters are indeterminate. The left atrium was moderately dilated. The right atrial size was mildly dilated.  Urinalysis was performed on admission and was very weakly indicative of a UTI. The patient was asymptomatic, had no fevers, or leukocytosis. Urine culture was obtained. It was returned this morning having grown out 80K E. Coli. However, the patient's daughter told me that when the urine for culture was obtained, the nurse tech told her that a small amount of  stool accidentally got mixed in with the urine. I will not treat for this positive urine culture.   Cardiology has seen the patient today. They feel that the patient is appropriate for discharge. Dr. Jacques Navy has recommended that the patient should be discharged on metoprolol 75 mg bid and amiodarone 200 mg bid. She will follow up with Atrial fibrillation clinic on 09/24/2019 and Dr. Duke Salvia in August.  Today's assessment: S: The patient is resting comfortably. No new complaints. O: Vitals:  Vitals:   09/15/19 1000 09/15/19  1200  BP: (!) 150/75   Pulse: 97   Resp: 16   Temp:  97.7 F (36.5 C)  SpO2: 95%     Exam:  Constitutional:  The patient is awake, alert, and oriented x 3. No acute distress. Respiratory:  No increased work of breathing. No wheezes, rales, or rhonchi No tactile fremitus Cardiovascular:  Regular rate and rhythm No murmurs, ectopy, or gallups. No  lateral PMI. No thrills. Abdomen:  Abdomen is soft, non-tender, non-distended No hernias, masses, or organomegaly Normoactive bowel sounds.  Musculoskeletal:  No cyanosis, clubbing, or edema Skin:  No rashes, lesions, ulcers palpation of skin: no induration or nodules Neurologic:  CN 2-12 intact Sensation all 4 extremities intact Psychiatric:  Mental status Mood, affect appropriate Orientation to person, place, time  judgment and insight appear intact   Discharge Instructions  Discharge Instructions     Activity as tolerated - No restrictions   Complete by: As directed    Call MD for:  difficulty breathing, headache or visual disturbances   Complete by: As directed    Call MD for:  persistant dizziness or light-headedness   Complete by: As directed    Call MD for:  temperature >100.4   Complete by: As directed    Diet - low sodium heart healthy   Complete by: As directed    Discharge wound care:   Complete by: As directed    Offload sacrum   Increase activity slowly   Complete by: As directed    Increase activity slowly   Complete by: As directed    No wound care   Complete by: As directed       Allergies as of 09/15/2019       Reactions   Flagyl [metronidazole]    Fingers get "numb"        Medication List     TAKE these medications    acetaminophen 500 MG tablet Commonly known as: TYLENOL Take 1 tablet (500 mg total) by mouth 2 (two) times daily as needed for mild pain or moderate pain.   amiodarone 200 MG tablet Commonly known as: PACERONE Take 1 tablet (200 mg total) by mouth 2 (two) times daily.   amLODipine 5 MG tablet Commonly known as: NORVASC Take 5 mg by mouth daily.   apixaban 2.5 MG Tabs tablet Commonly known as: ELIQUIS Take 1 tablet (2.5 mg total) by mouth 2 (two) times daily.   CERTAVITE SENIOR/ANTIOXIDANT PO Take 1 tablet by mouth daily.   chlorhexidine 0.12 % solution Commonly known as: PERIDEX Use as directed 15 mLs in  the mouth or throat 2 (two) times daily. Swish and spit   citalopram 20 MG tablet Commonly known as: CELEXA Take 1 tablet (20 mg total) by mouth daily. What changed:  See the new instructions. Another medication with the same name was removed. Continue taking this medication, and follow the directions you see here.   CORICIDIN 2-325 MG Tabs Generic drug: Chlorpheniramine-APAP Take 1 tablet by mouth 2 (two) times daily as needed (congestion).   furosemide 20 MG tablet Commonly known as: LASIX Take 1 tablet (20 mg total) by mouth every Monday, Wednesday, and Friday. What changed: Another medication with the same name was added. Make sure you understand how and when to take each.   furosemide 20 MG tablet Commonly known as: LASIX Take 1 tablet (20 mg total) by mouth every Monday, Wednesday, and Friday. Start taking on: September 16, 2019 What changed: You were already taking a  medication with the same name, and this prescription was added. Make sure you understand how and when to take each.   galantamine 8 MG 24 hr capsule Commonly known as: RAZADYNE ER Take 8 mg by mouth daily with breakfast.   HYDROcodone-acetaminophen 5-325 MG tablet Commonly known as: NORCO/VICODIN Take 1-2 tablets by mouth every 6 (six) hours as needed for moderate pain.   ipratropium-albuterol 0.5-2.5 (3) MG/3ML Soln Commonly known as: DUONEB Administer by nebulizer every 6 hours as needed for shortness of breath.   loperamide 2 MG capsule Commonly known as: IMODIUM Take 2 mg by mouth as needed for diarrhea or loose stools.   LORazepam 0.5 MG tablet Commonly known as: ATIVAN Take 1 tablet (0.5 mg total) by mouth 2 (two) times daily. What changed: when to take this   methocarbamol 500 MG tablet Commonly known as: ROBAXIN Take 1 tablet (500 mg total) by mouth every 6 (six) hours as needed for muscle spasms.   metoprolol tartrate 50 MG tablet Commonly known as: LOPRESSOR Take 1 tablet (50 mg total) by  mouth 2 (two) times daily. What changed:  medication strength how much to take   Metoprolol Tartrate 75 MG Tabs Take 75 mg by mouth 2 (two) times daily. What changed:  medication strength how much to take   potassium chloride SA 20 MEQ tablet Commonly known as: KLOR-CON Take 20 mEq by mouth every other day.   psyllium 0.52 g capsule Commonly known as: REGULOID Take 0.52 g by mouth daily.   saline Gel Place 1 application into both nostrils at bedtime.   sodium chloride 0.65 % Soln nasal spray Commonly known as: OCEAN Place 3 sprays into both nostrils 4 (four) times daily.   simvastatin 10 MG tablet Commonly known as: ZOCOR Take 1 tablet (10 mg total) by mouth daily at 6 PM.   Stiolto Respimat 2.5-2.5 MCG/ACT Aers Generic drug: Tiotropium Bromide-Olodaterol Inhale 2 puffs into the lungs daily.   Systane 0.4-0.3 % Soln Generic drug: Polyethyl Glycol-Propyl Glycol Place 1 drop into both eyes every hour as needed (dry eyes/irritation).   ziprasidone 60 MG capsule Commonly known as: GEODON Take 40 mg by mouth daily. 40mg  in the afternoon What changed: Another medication with the same name was changed. Make sure you understand how and when to take each.   ziprasidone 20 MG capsule Commonly known as: GEODON TAKE ONE CAPSULE BY MOUTH DAILY IN THE AFTERNOON **DO NOT CRUSH** **NO REFILLS** What changed: See the new instructions.               Discharge Care Instructions  (From admission, onward)           Start     Ordered   09/15/19 0000  Discharge wound care:       Comments: Offload sacrum   09/15/19 1445   09/15/19 0000  Discharge wound care:       Comments: Off load sacrum   09/15/19 1452            Allergies  Allergen Reactions   Flagyl [Metronidazole]     Fingers get "numb"    The results of significant diagnostics from this hospitalization (including imaging, microbiology, ancillary and laboratory) are listed below for reference.     Significant Diagnostic Studies: CT Angio Chest PE W and/or Wo Contrast  Result Date: 09/10/2019 CLINICAL DATA:  86 year old female with COPD.  Hypoxia. EXAM: CT ANGIOGRAPHY CHEST WITH CONTRAST TECHNIQUE: Multidetector CT imaging of the chest was performed using the  standard protocol during bolus administration of intravenous contrast. Multiplanar CT image reconstructions and MIPs were obtained to evaluate the vascular anatomy. CONTRAST:  OMNIPAQUE IOHEXOL 350 MG/ML SOLN COMPARISON:  CTA chest 02/03/2019. FINDINGS: Cardiovascular: Good contrast bolus timing in the pulmonary arterial tree. Mild respiratory motion, most pronounced at the lung bases. No focal filling defect identified in the pulmonary arteries to suggest acute pulmonary embolism. Stable cardiomegaly. No pericardial effusion. Calcified aortic atherosclerosis. Mediastinum/Nodes: Negative.  No mediastinal lymphadenopathy. Lungs/Pleura: Small bilateral layering pleural effusions. Enhancing compressive atelectasis at both lung bases. Lung volumes are not significantly changed compared to December, and the major airways remain patent. No pneumonia or other acute pulmonary opacity. Upper Abdomen: Contrast reflux into the hepatic IVC and hepatic veins. Otherwise negative visible liver. Negative visible spleen, adrenal glands, left kidney and bowel. Musculoskeletal: No acute osseous abnormality identified. Review of the MIP images confirms the above findings. IMPRESSION: 1. Negative for acute pulmonary embolus. 2. Small layering pleural effusions with compressive atelectasis at both lung bases. But no other acute pulmonary process. 3. Stable cardiomegaly. Contrast reflux into the hepatic veins and hepatic veins compatible with a degree of right heart failure. 4. Aortic Atherosclerosis (ICD10-I70.0). Electronically Signed   By: Odessa Fleming M.D.   On: 09/10/2019 21:00   DG CHEST PORT 1 VIEW  Result Date: 09/13/2019 CLINICAL DATA:  Congestive heart  failure. EXAM: PORTABLE CHEST 1 VIEW COMPARISON:  September 10, 2019. FINDINGS: Stable cardiomediastinal silhouette. No pneumothorax or pleural effusion is noted. Lungs are clear. Bony thorax is unremarkable. IMPRESSION: No active disease. Electronically Signed   By: Lupita Raider M.D.   On: 09/13/2019 12:55   DG Chest Portable 1 View  Result Date: 09/10/2019 CLINICAL DATA:  Acute dyspnea, hypoxia EXAM: PORTABLE CHEST 1 VIEW COMPARISON:  05/14/2019 FINDINGS: Single frontal view of the chest demonstrates persistent enlargement the cardiac silhouette. Stable ectasia of the thoracic aorta. There is background emphysema and hyperinflation. Minimal residual retrocardiac consolidation and trace left pleural effusion remain, markedly improved since previous study. Right chest is clear. No pneumothorax. IMPRESSION: 1. Minimal residual left basilar consolidation and effusion since prior study. 2. Stable emphysema. Electronically Signed   By: Sharlet Salina M.D.   On: 09/10/2019 19:12   ECHOCARDIOGRAM COMPLETE  Result Date: 09/11/2019    ECHOCARDIOGRAM REPORT   Patient Name:   Yolanda Baker Date of Exam: 09/11/2019 Medical Rec #:  [id number]     Height:       61.0 in Accession #:    [id number]    Weight:       100.0 lb Date of Birth:  19-Sep-1923     BSA:          1.407 m Patient Age:    89 years      BP:           118/61 mmHg Patient Gender: F             HR:           70 bpm. Exam Location:  Inpatient Procedure: 2D Echo                             MODIFIED REPORT: This report was modified by Donato Schultz MD on 09/11/2019 due to EF 45-50%.  Indications:     CHF  History:         Patient has prior history of Echocardiogram examinations, most  recent 02/03/2019. COPD, Arrythmias:Atrial Fibrillation; Risk                  Factors:Hypertension.  Sonographer:     Thurman Coyer RDCS (AE) Referring Phys:  [id number] Alessandra Bevels Diagnosing Phys: Donato Schultz MD IMPRESSIONS  1. Left ventricular ejection fraction,  by estimation, is 45 to 50%. The left ventricle has mildly decreased function. The left ventricle demonstrates global hypokinesis. There is mild left ventricular hypertrophy. Left ventricular diastolic parameters are indeterminate.  2. Right ventricular systolic function is normal. The right ventricular size is normal. There is normal pulmonary artery systolic pressure. The estimated right ventricular systolic pressure is 27.6 mmHg.  3. Left atrial size was moderately dilated.  4. Right atrial size was mildly dilated.  5. The mitral valve is normal in structure. Mild mitral valve regurgitation. No evidence of mitral stenosis.  6. Tricuspid valve regurgitation is moderate.  7. The aortic valve is tricuspid. Aortic valve regurgitation is not visualized. Mild aortic valve sclerosis is present, with no evidence of aortic valve stenosis.  8. The inferior vena cava is normal in size with greater than 50% respiratory variability, suggesting right atrial pressure of 3 mmHg. FINDINGS  Left Ventricle: Left ventricular ejection fraction, by estimation, is 45 to 50%. The left ventricle has mildly decreased function. The left ventricle demonstrates global hypokinesis. The left ventricular internal cavity size was normal in size. There is  mild left ventricular hypertrophy. Left ventricular diastolic parameters are indeterminate. Right Ventricle: The right ventricular size is normal. No increase in right ventricular wall thickness. Right ventricular systolic function is normal. There is normal pulmonary artery systolic pressure. The tricuspid regurgitant velocity is 2.48 m/s, and  with an assumed right atrial pressure of 3 mmHg, the estimated right ventricular systolic pressure is 27.6 mmHg. Left Atrium: Left atrial size was moderately dilated. Right Atrium: Right atrial size was mildly dilated. Pericardium: There is no evidence of pericardial effusion. Mitral Valve: The mitral valve is normal in structure. Normal mobility of the  mitral valve leaflets. Mild mitral valve regurgitation. No evidence of mitral valve stenosis. Tricuspid Valve: The tricuspid valve is normal in structure. Tricuspid valve regurgitation is moderate . No evidence of tricuspid stenosis. Aortic Valve: The aortic valve is tricuspid. Aortic valve regurgitation is not visualized. Mild aortic valve sclerosis is present, with no evidence of aortic valve stenosis. Pulmonic Valve: The pulmonic valve was normal in structure. Pulmonic valve regurgitation is not visualized. No evidence of pulmonic stenosis. Aorta: The aortic root is normal in size and structure. Venous: The inferior vena cava is normal in size with greater than 50% respiratory variability, suggesting right atrial pressure of 3 mmHg. IAS/Shunts: No atrial level shunt detected by color flow Doppler.  LEFT VENTRICLE PLAX 2D LVIDd:         3.20 cm LVIDs:         2.50 cm LV PW:         1.20 cm LV IVS:        0.90 cm LVOT diam:     2.00 cm LV SV:         39 LV SV Index:   28 LVOT Area:     3.14 cm  LEFT ATRIUM             Index       RIGHT ATRIUM           Index LA diam:        2.90 cm 2.06 cm/m  RA Area:     19.40 cm LA Vol (A2C):   59.0 ml 41.94 ml/m RA Volume:   44.80 ml  31.84 ml/m LA Vol (A4C):   59.5 ml 42.29 ml/m LA Biplane Vol: 62.3 ml 44.28 ml/m  AORTIC VALVE LVOT Vmax:   53.90 cm/s LVOT Vmean:  40.300 cm/s LVOT VTI:    0.124 m  AORTA Ao Root diam: 2.90 cm Ao Asc diam:  3.10 cm TRICUSPID VALVE TR Peak grad:   24.6 mmHg TR Vmax:        248.00 cm/s  SHUNTS Systemic VTI:  0.12 m Systemic Diam: 2.00 cm Donato Schultz MD Electronically signed by Donato Schultz MD Signature Date/Time: 09/11/2019/4:01:51 PM    Final (Updated)    DG Femur Portable Min 2 Views Right  Result Date: 09/10/2019 CLINICAL DATA:  Chronic right femoral fracture. Assess interval healing. EXAM: RIGHT FEMUR PORTABLE 2 VIEW COMPARISON:  05/06/2019 FINDINGS: Surgical changes right total hip arthroplasty are again identified. An oblique fracture  of the proximal femoral diaphysis surrounding the distal femoral stem component is again identified and now demonstrates bridging callus between the fracture fragments in keeping with interval healing. There is residual deformity with minimal posterior angulation and approximately 1/2 shaft with lateral displacement of the distal femoral shaft. Extensive heterotopic ossification surrounding the femoral stem component of the arthroplasty involving the proximal femur in the region of the right hip is again identified. Marked protrusio is also, again, identified with 1 of the acetabular screws extending into the medial pelvis, unchanged from prior examination. Advanced vascular calcifications are seen within the soft tissues of the right lower extremity. IMPRESSION: Interval healing now with bridging callus between the fracture fragments. Mild residual deformity. 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 Electronically Signed   By: Helyn Numbers MD   On: 09/10/2019 20:11    Microbiology: Recent Results (from the past 240 hour(s))  Culture, blood (routine x 2)     Status: None   Collection Time: 09/10/19  5:36 PM   Specimen: BLOOD RIGHT FOREARM  Result Value Ref Range Status   Specimen Description   Final    BLOOD RIGHT FOREARM Performed at [facility], [address] [place], [place] [address]    Special Requests   Final    BOTTLES DRAWN AEROBIC AND ANAEROBIC Blood Culture adequate volume Performed at [facility], [address] [place], [place] [address]    Culture   Final    NO GROWTH 5 DAYS Performed at [facility], [address], [place], [place] [address]    Report Status 09/15/2019 FINAL  Final  Culture, blood (routine x 2)     Status: None   Collection Time: 09/10/19  5:37 PM   Specimen: BLOOD  Result Value Ref Range Status   Specimen Description   Final    BLOOD LEFT ANTECUBITAL Performed at [facility], [address] [place], [place] [address]    Special Requests   Final    BOTTLES DRAWN AEROBIC AND ANAEROBIC Blood Culture results may not be optimal due to an excessive volume of blood received in culture bottles Performed at [facility], [address] [place], [place] [address]    Culture   Final    NO GROWTH 5 DAYS Performed at [facility], [address], [place], [place] [address]    Report Status 09/15/2019 FINAL  Final  SARS Coronavirus 2 by RT  PCR (hospital order, performed in [facility] hospital lab) Nasopharyngeal Nasopharyngeal Swab     Status: None   Collection Time: 09/10/19  5:41 PM   Specimen: Nasopharyngeal Swab  Result Value Ref Range Status   SARS Coronavirus 2 NEGATIVE NEGATIVE Final    Comment: (NOTE) SARS-CoV-2 target nucleic acids are NOT DETECTED.  The SARS-CoV-2 RNA is generally detectable in upper and lower respiratory specimens during the acute phase of infection. The lowest concentration of SARS-CoV-2 viral copies this assay can detect is 250 copies / mL. A negative result does not preclude SARS-CoV-2 infection and should not be used as the sole basis for treatment or other patient management decisions.  A negative result may occur with improper specimen collection / handling, submission of specimen other than nasopharyngeal swab, presence of viral mutation(s) within the areas targeted by this assay, and inadequate number of viral copies (<250 copies / mL). A negative result must be combined with clinical observations, patient history, and epidemiological information.  Fact Sheet for Patients:   BoilerBrush.com.cy  Fact Sheet for Healthcare Providers: https://pope.com/  This test is not yet approved or  cleared by the [place] FDA and has been authorized for detection and/or diagnosis of SARS-CoV-2 by FDA under an Emergency Use Authorization  (EUA).  This EUA will remain in effect (meaning this test can be used) for the duration of the COVID-19 declaration under Section 564(b)(1) of the Act, 21 U.S.C. section 360bbb-3(b)(1), unless the authorization is terminated or revoked sooner.  Performed at [facility], [address], [place], [place] [address]   MRSA PCR Screening     Status: None   Collection Time: 09/11/19  6:07 PM   Specimen: Nasal Mucosa; Nasopharyngeal  Result Value Ref Range Status   MRSA by PCR NEGATIVE NEGATIVE Final    Comment:        The GeneXpert MRSA Assay (FDA approved for NASAL specimens only), is one component of a comprehensive MRSA colonization surveillance program. It is not intended to diagnose MRSA infection nor to guide or monitor treatment for MRSA infections. Performed at [facility], [address], [place], [place] [address]   Culture, Urine     Status: Abnormal   Collection Time: 09/13/19  9:19 AM   Specimen: Urine, Catheterized  Result Value Ref Range Status   Specimen Description   Final    URINE, CATHETERIZED Performed at [facility], [address] [place], [place] [address]    Special Requests   Final    NONE Performed at [facility], [address] [place], [place] [address]    Culture 80,000 COLONIES/mL ESCHERICHIA COLI (A)  Final   Report Status 09/15/2019 FINAL  Final   Organism ID, Bacteria ESCHERICHIA COLI (A)  Final      Susceptibility   Escherichia coli - MIC*    AMPICILLIN 4 SENSITIVE Sensitive     CEFAZOLIN <=4 SENSITIVE Sensitive     CEFTRIAXONE <=0.25 SENSITIVE Sensitive     CIPROFLOXACIN <=0.25 SENSITIVE Sensitive     GENTAMICIN <=1 SENSITIVE Sensitive     IMIPENEM <=0.25 SENSITIVE Sensitive     NITROFURANTOIN <=16 SENSITIVE Sensitive     TRIMETH/SULFA <=20 SENSITIVE Sensitive     AMPICILLIN/SULBACTAM <=2 SENSITIVE Sensitive     PIP/TAZO <=4 SENSITIVE Sensitive     *  80,000 COLONIES/mL ESCHERICHIA COLI     Labs: Basic Metabolic Panel: Recent Labs  Lab 09/11/19 0505 09/12/19 0244 09/13/19 0719 09/14/19 0402  09/15/19 0251  NA 136 136 136 135 134*  K 3.8 4.0 3.5 4.2 3.9  CL 101 100 99 100 98  CO2 GLUCOSE 103* 95 96 103* 97  BUN 23 28* 23 29* 27*  CREATININE 0.81 0.89 0.85 1.04* 0.77  CALCIUM 9.0 8.9 8.4* 8.4* 8.4*  MG 1.9  --   --   --   --    Liver Function Tests: Recent Labs  Lab 09/10/19 1736 09/11/19 0505 09/12/19 0244  AST ALT ALKPHOS 75 65 57  BILITOT 0.8 0.7 0.8  PROT 7.7 7.3 7.3  ALBUMIN 3.8 3.7 3.6   No results for input(s): LIPASE, AMYLASE in the last 168 hours. No results for input(s): AMMONIA in the last 168 hours. CBC: Recent Labs  Lab 09/10/19 1736 09/11/19 0505 09/12/19 0244 09/13/19 0719 09/14/19 0402  WBC 9.2 8.5 8.2 6.9 7.8  NEUTROABS 5.2 5.1 4.2 3.9 4.2  HGB 12.3 11.6* 11.5* 11.7* 11.6*  HCT 38.1 36.7 36.2 37.2 36.7  MCV 97.7 96.6 97.1 98.7 98.9  PLT 259 258 229 249 248   Cardiac Enzymes: No results for input(s): CKTOTAL, CKMB, CKMBINDEX, TROPONINI in the last 168 hours. BNP: BNP (last 3 results) Recent Labs    02/02/19 2200 05/14/19 0050 09/10/19 1736  BNP 335.0* 627.9* 596.7*    ProBNP (last 3 results) No results for input(s): PROBNP in the last 8760 hours.  CBG: No results for input(s): GLUCAP in the last 168 hours.  Principal Problem:   Atrial fibrillation with RVR (HCC) Active Problems:   Dementia (HCC)   Schizoaffective disorder (HCC)   Acute on chronic congestive heart failure (HCC)   Acute on chronic diastolic heart failure (HCC)   DNR (do not resuscitate) present on admission   Time coordinating discharge: 38 minutes.  Signed:        Ava Swayze, DO [facility]  09/15/2019, 2:47 PM

## 2019-09-15 NOTE — Progress Notes (Addendum)
Progress Note  Patient Name: Yolanda Baker Date of Encounter: 09/15/2019  Primary Cardiologist: Chilton Si, MD  Subjective   "I feel pretty good today." Denies any complaints. No family at bedside this AM.  Inpatient Medications    Scheduled Meds: . amiodarone  200 mg Oral BID  . amLODipine  5 mg Oral Daily  . apixaban  2.5 mg Oral BID  . Chlorhexidine Gluconate Cloth  6 each Topical Daily  . citalopram  20 mg Oral Daily  . [START ON 09/16/2019] furosemide  20 mg Oral Q M,W,F  . galantamine  8 mg Oral Q breakfast  . LORazepam  0.5 mg Oral Daily  . mouth rinse  15 mL Mouth Rinse BID  . metoprolol tartrate  75 mg Oral BID  . ziprasidone  20 mg Oral Daily   Continuous Infusions:  PRN Meds: ondansetron **OR** ondansetron (ZOFRAN) IV   Vital Signs    Vitals:   09/15/19 0200 09/15/19 0400 09/15/19 0605 09/15/19 0610  BP: 122/68 127/60 (!) 145/91   Pulse:   (!) 136 (!) 107  Resp: 11 (!) 21 13 (!) 24  Temp:  (!) 97.4 F (36.3 C)    TempSrc:  Oral    SpO2: 100% 98% 90% 93%  Weight:  48.4 kg    Height:        Intake/Output Summary (Last 24 hours) at 09/15/2019 0747 Last data filed at 09/15/2019 0600 Gross per 24 hour  Intake 960 ml  Output 700 ml  Net 260 ml   Last 3 Weights 09/15/2019 09/14/2019 09/13/2019  Weight (lbs) 106 lb 11.2 oz 103 lb 9.9 oz 103 lb 9.9 oz  Weight (kg) 48.4 kg 47 kg 47 kg     Telemetry    Atrial fib rates 70-100, Pulse ox wave form not consistent/reliable- Personally Reviewed  Physical Exam   GEN: Frail appearing elderly WF in no acute distress.  HEENT: Normocephalic, atraumatic, sclera non-icteric. Neck: No JVD or bruits. Cardiac: Irregularly irregular, no murmurs, rubs, or gallops.  Radials/DP/PT 1+ and equal bilaterally.  Respiratory: Clear to auscultation bilaterally. Breathing is unlabored. GI: Soft, nontender, non-distended, BS +x 4. MS: no deformity. Extremities: No clubbing or cyanosis. No edema. Distal pedal pulses are  2+ and equal bilaterally. Neuro:  AAOx3. Follows commands. Psych:  Responds to questions appropriately with a normal affect.  Labs    High Sensitivity Troponin:   Recent Labs  Lab 09/10/19 1736 09/10/19 2001 09/11/19 0505  TROPONINIHS 10 5 12       Cardiac EnzymesNo results for input(s): TROPONINI in the last 168 hours. No results for input(s): TROPIPOC in the last 168 hours.   Chemistry Recent Labs  Lab 09/10/19 1736 09/10/19 1736 09/11/19 0505 09/11/19 0505 09/12/19 0244 09/12/19 0244 09/13/19 0719 09/14/19 0402 09/15/19 0251  NA 139   < > 136   < > 136   < > 136 135 134*  K 4.2   < > 3.8   < > 4.0   < > 3.5 4.2 3.9  CL 102   < > 101   < > 100   < > 99 100 98  CO2 26   < > 26   < > 26   < > 27 26 26   GLUCOSE 97   < > 103*   < > 95   < > 96 103* 97  BUN 22   < > 23   < > 28*   < > 23 29* 27*  CREATININE 0.81   < > 0.81   < > 0.89   < > 0.85 1.04* 0.77  CALCIUM 9.2   < > 9.0   < > 8.9   < > 8.4* 8.4* 8.4*  PROT 7.7  --  7.3  --  7.3  --   --   --   --   ALBUMIN 3.8  --  3.7  --  3.6  --   --   --   --   AST 22  --  23  --  25  --   --   --   --   ALT 12  --  11  --  11  --   --   --   --   ALKPHOS 75  --  65  --  57  --   --   --   --   BILITOT 0.8  --  0.7  --  0.8  --   --   --   --   GFRNONAA >60   < > >60   < > 55*   < > 59* 46* >60  GFRAA >60   < > >60   < > >60   < > >60 53* >60  ANIONGAP 11   < > 9   < > 10   < > 10 9 10    < > = values in this interval not displayed.     Hematology Recent Labs  Lab 09/12/19 0244 09/13/19 0719 09/14/19 0402  WBC 8.2 6.9 7.8  RBC 3.73* 3.77* 3.71*  HGB 11.5* 11.7* 11.6*  HCT 36.2 37.2 36.7  MCV 97.1 98.7 98.9  MCH 30.8 31.0 31.3  MCHC 31.8 31.5 31.6  RDW 15.9* 15.8* 15.7*  PLT 229 249 248    BNP Recent Labs  Lab 09/10/19 1736  BNP 596.7*     DDimer  Recent Labs  Lab 09/10/19 1736  DDIMER 1.89*     Radiology    No results found.  Cardiac Studies   2D Echo 09/11/19  1. Left ventricular  ejection fraction, by estimation, is 45 to 50%. The  left ventricle has mildly decreased function. The left ventricle  demonstrates global hypokinesis. There is mild left ventricular  hypertrophy. Left ventricular diastolic parameters  are indeterminate.  2. Right ventricular systolic function is normal. The right ventricular  size is normal. There is normal pulmonary artery systolic pressure. The  estimated right ventricular systolic pressure is 27.6 mmHg.  3. Left atrial size was moderately dilated.  4. Right atrial size was mildly dilated.  5. The mitral valve is normal in structure. Mild mitral valve  regurgitation. No evidence of mitral stenosis.  6. Tricuspid valve regurgitation is moderate.  7. The aortic valve is tricuspid. Aortic valve regurgitation is not  visualized. Mild aortic valve sclerosis is present, with no evidence of  aortic valve stenosis.  8. The inferior vena cava is normal in size with greater than 50%  respiratory variability, suggesting right atrial pressure of 3 mmHg.    Patient Profile     86 y.o. female with h/o  paroxysmal atrial fibrillation/flutter, sinus bradycardia when in normal rhythm, chronic diastolic CHF, HTN, stroke, dementia, falls, anxiety, osteoporosis, mild intermittent anemia by labs who was admitted 09/10/19 with increasing SOB, hypoxia, AF RVR and CHF. 2D echo showed EF 45-50%.  Assessment & Plan    1. Paroxysmal atrial fibrillation - outpatient dosing of metoprolol appeared variable from 25->100mg , most recently  listed as 25mg  BID. Given elevated rates this admission, metoprolol was titrated and she was started on oral amiodarone. - HR remains around 70-100 this morning, asymptomatic - anticipate continuing current regimen to include amiodarone 200mg  BID and metoprolol 75mg  BID - need to be cognizant that when in NSR, HR tends to run in the 50s so would likely hold at present doses since she is consistently <110bpm - No role for  acute DCCV at present time since her atrial fib is known to be paroxysmal - CHADSVASC 8 for CHF, HTN, age x 2, stroke x 2, vascular disease (aortic atherosclerosis) and female sex therefore continue apixaban as we are able (appropriately dosed at 2.5mg  BID)  2. Acute hypoxic respiratory failure possibly due to acute on chronic combined CHF, precipitated by recurrent atrial fib. LVEF mildly decreased at 45-50% but not significantly different than prior admission. hsTroponins negative, arguing against ACS.  - anticipate conservative management of cardiomyopathy - she is -1.4L by I/Os (unclear if accurate), weights variable from 101-104-103-103-106 but volume wise still looks pretty good - Lasix held yesterday due to rise in BUN/Cr with plans to resume on MWF dosing tomorrow  3. Acute kidney injury - baseline Cr around the 0.8 range, peak 1.04 on 7/19 likely reflecting the endpoint of IV diuresis.  - improved with holding further IV Lasix - diuretic plan as above  4. Mild anemia - appears stable. - continue conservative monitoring as outpatient  5. Abnormal thyroid function - TSH slightly elevated with normal fT4. - per IM - will need close monitoring while on amiodarone  MD to see to finalize recs. Arranged f/u in afib clinic which was granted for 09/24/19. She also has follow-up in late August with Dr. 8/19.  For questions or updates, please contact [facility] Please consult [email or web address] for contact info under Cardiology/STEMI.  Signed, 09/26/19, PA-C 09/15/2019, 7:47 AM    Patient seen and examined with Yolanda Salvia PA-C.  Agree as above, with the following exceptions and changes as noted below. She feels well, no palpitations, CP, SOB or lightheadedness. Gen: NAD, CV: iRRR, no murmurs, Lungs: clear, Abd: soft, Extrem: Warm, well perfused, no edema, Neuro/Psych: alert and oriented x 3, normal mood and affect. All available labs, radiology testing, previous records reviewed.  Rate is much better controlled today. We discussed watching for bradycardia if she converts to SR. Recommended calling [facility] office for triage nurse or presentation to ED if feeling unwell. Resume home lasix dose on discharge.  Yolanda Montana, MD 09/15/19 2:01 PM

## 2019-09-15 NOTE — NC FL2 (Signed)
[place] MEDICAID FL2 LEVEL OF CARE SCREENING TOOL     IDENTIFICATION  Patient Name: Yolanda Baker Birthdate: 08-16-1923 Sex: female Admission Date (Current Location): 09/10/2019  [facility] and [place] Number:  Producer, television/film/video and Address:  [facility],  [address] [place] [address]      Provider Number: [id number]  Attending Physician Name and Address:  Fran Lowes, DO  Relative Name and Phone Number:       Current Level of Care: Hospital Recommended Level of Care: Assisted Living Facility Prior Approval Number:    Date Approved/Denied:   PASRR Number:    Discharge Plan: Other (Comment) (alf)    Current Diagnoses: Patient Active Problem List   Diagnosis Date Noted  . Atrial fibrillation with RVR (HCC) 09/11/2019  . PAF (paroxysmal atrial fibrillation) (HCC) 08/04/2019  . Goals of care, counseling/discussion   . Palliative care by specialist   . Pressure injury of skin 05/07/2019  . Periprosthetic fracture around internal prosthetic hip joint 05/06/2019  . DNR (do not resuscitate) present on admission 02/04/2019  . Acute on chronic diastolic heart failure (HCC)   . Acute on chronic congestive heart failure (HCC) 02/03/2019  . GAD (generalized anxiety disorder) 01/15/2018  . Schizoaffective disorder (HCC) 01/15/2018  . Hyperlipidemia LDL goal <70 07/01/2014  . Constipation chronic 07/01/2014  . Acute CVA (cerebrovascular accident) (HCC)   . Acute blood loss anemia 10/12/2013  . Postoperative anemia due to acute blood loss 10/04/2013  . Postop Transfusion 10/04/2013  . Closed fracture of femur with nonunion 10/03/2013  . Femur fracture, right (HCC) 10/03/2013  . Closed femur fracture (HCC) 10/03/2013  . UTI (urinary tract infection) 06/23/2013  . Dementia (HCC) 06/09/2013  . Anxiety 06/09/2013  . Atrial flutter (HCC) 06/07/2013  . Hip fracture (HCC) 06/04/2013  . Depression 06/04/2013  . Anxiety state 06/04/2013  . Essential  hypertension 06/04/2013  . Closed right hip fracture (HCC) 06/04/2013    Orientation RESPIRATION BLADDER Height & Weight     Self, Time, Situation, Place  Normal Continent Weight: 48.4 kg Height:  5\' 3"  (160 cm)  BEHAVIORAL SYMPTOMS/MOOD NEUROLOGICAL BOWEL NUTRITION STATUS      Continent Diet (regular)  AMBULATORY STATUS COMMUNICATION OF NEEDS Skin   Extensive Assist Verbally Normal                       Personal Care Assistance Level of Assistance  Bathing, Dressing, Feeding Bathing Assistance: Limited assistance Feeding assistance: Limited assistance Dressing Assistance: Limited assistance     Functional Limitations Info  Sight, Hearing, Speech Sight Info: Adequate Hearing Info: Adequate Speech Info: Adequate    SPECIAL CARE FACTORS FREQUENCY                       Contractures Contractures Info: Not present    Additional Factors Info  Code Status Code Status Info: dnr             Current Medications (09/15/2019):  This is the current hospital active medication list Current Facility-Administered Medications  Medication Dose Route Frequency Provider Last Rate Last Admin  . amiodarone (PACERONE) tablet 200 mg  200 mg Oral BID 09/17/2019, MD   200 mg at 09/15/19 1107  . amLODipine (NORVASC) tablet 5 mg  5 mg Oral Daily 09/17/19, MD   5 mg at 09/15/19 1107  . apixaban (ELIQUIS) tablet 2.5 mg  2.5 mg Oral BID 09/17/19,  MD   2.5 mg at 09/15/19 1108  . Chlorhexidine Gluconate Cloth 2 % PADS 6 each  6 each Topical Daily Alessandra Bevels, MD   6 each at 09/15/19 0540  . citalopram (CELEXA) tablet 20 mg  20 mg Oral Daily Eduard Clos, MD   20 mg at 09/15/19 1107  . [START ON 09/16/2019] furosemide (LASIX) tablet 20 mg  20 mg Oral Q M,W,F Dunn, Dayna N, PA-C      . galantamine (RAZADYNE ER) 24 hr capsule 8 mg  8 mg Oral Q breakfast Eduard Clos, MD   8 mg at 09/15/19 1107  . LORazepam (ATIVAN) tablet 0.5 mg   0.5 mg Oral Daily Eduard Clos, MD   0.5 mg at 09/15/19 1108  . MEDLINE mouth rinse  15 mL Mouth Rinse BID Alessandra Bevels, MD   15 mL at 09/15/19 1108  . metoprolol tartrate (LOPRESSOR) tablet 75 mg  75 mg Oral BID Weston Brass A, MD   75 mg at 09/15/19 1107  . ondansetron (ZOFRAN) tablet 4 mg  4 mg Oral Q6H PRN Eduard Clos, MD       Or  . ondansetron [facility]) injection 4 mg  4 mg Intravenous Q6H PRN Eduard Clos, MD      . ziprasidone (GEODON) capsule 20 mg  20 mg Oral Daily Eduard Clos, MD   20 mg at 09/14/19 1751     Discharge Medications: Please see discharge summary for a list of discharge medications.  Relevant Imaging Results:  Relevant Lab Results:   Additional Information [id number] [place]

## 2019-09-15 NOTE — Plan of Care (Signed)
Patient resting well, denies discomfort, breathing well, and HR remains controlled in afib. Continuing to monitor.   Problem: Education: Goal: Knowledge of General Education information will improve Description: Including pain rating scale, medication(s)/side effects and non-pharmacologic comfort measures Outcome: Progressing   Problem: Health Behavior/Discharge Planning: Goal: Ability to manage health-related needs will improve Outcome: Progressing   Problem: Clinical Measurements: Goal: Ability to maintain clinical measurements within normal limits will improve Outcome: Progressing Goal: Will remain free from infection Outcome: Progressing Goal: Diagnostic test results will improve Outcome: Progressing Goal: Respiratory complications will improve Outcome: Progressing Goal: Cardiovascular complication will be avoided Outcome: Progressing   Problem: Activity: Goal: Risk for activity intolerance will decrease Outcome: Progressing   Problem: Nutrition: Goal: Adequate nutrition will be maintained Outcome: Progressing   Problem: Coping: Goal: Level of anxiety will decrease Outcome: Progressing   Problem: Elimination: Goal: Will not experience complications related to bowel motility Outcome: Progressing Goal: Will not experience complications related to urinary retention Outcome: Progressing   Problem: Pain Managment: Goal: General experience of comfort will improve Outcome: Progressing   Problem: Safety: Goal: Ability to remain free from injury will improve Outcome: Progressing   Problem: Skin Integrity: Goal: Risk for impaired skin integrity will decrease Outcome: Progressing

## 2019-09-16 DIAGNOSIS — E7849 Other hyperlipidemia: Secondary | ICD-10-CM | POA: Diagnosis not present

## 2019-09-16 DIAGNOSIS — R634 Abnormal weight loss: Secondary | ICD-10-CM | POA: Diagnosis not present

## 2019-09-16 DIAGNOSIS — I10 Essential (primary) hypertension: Secondary | ICD-10-CM | POA: Diagnosis not present

## 2019-09-16 DIAGNOSIS — I4891 Unspecified atrial fibrillation: Secondary | ICD-10-CM | POA: Diagnosis not present

## 2019-09-18 DIAGNOSIS — M6281 Muscle weakness (generalized): Secondary | ICD-10-CM | POA: Diagnosis not present

## 2019-09-18 DIAGNOSIS — S72301D Unspecified fracture of shaft of right femur, subsequent encounter for closed fracture with routine healing: Secondary | ICD-10-CM | POA: Diagnosis not present

## 2019-09-19 DIAGNOSIS — R0602 Shortness of breath: Secondary | ICD-10-CM | POA: Diagnosis not present

## 2019-09-19 DIAGNOSIS — J449 Chronic obstructive pulmonary disease, unspecified: Secondary | ICD-10-CM | POA: Diagnosis not present

## 2019-09-21 DIAGNOSIS — M6281 Muscle weakness (generalized): Secondary | ICD-10-CM | POA: Diagnosis not present

## 2019-09-21 DIAGNOSIS — S72301D Unspecified fracture of shaft of right femur, subsequent encounter for closed fracture with routine healing: Secondary | ICD-10-CM | POA: Diagnosis not present

## 2019-09-22 DIAGNOSIS — M6281 Muscle weakness (generalized): Secondary | ICD-10-CM | POA: Diagnosis not present

## 2019-09-22 DIAGNOSIS — S72301D Unspecified fracture of shaft of right femur, subsequent encounter for closed fracture with routine healing: Secondary | ICD-10-CM | POA: Diagnosis not present

## 2019-09-23 DIAGNOSIS — R41841 Cognitive communication deficit: Secondary | ICD-10-CM | POA: Diagnosis not present

## 2019-09-23 DIAGNOSIS — R488 Other symbolic dysfunctions: Secondary | ICD-10-CM | POA: Diagnosis not present

## 2019-09-24 ENCOUNTER — Ambulatory Visit (HOSPITAL_COMMUNITY): Payer: Medicare PPO | Admitting: Physician Assistant

## 2019-09-24 DIAGNOSIS — S72301D Unspecified fracture of shaft of right femur, subsequent encounter for closed fracture with routine healing: Secondary | ICD-10-CM | POA: Diagnosis not present

## 2019-09-24 DIAGNOSIS — R488 Other symbolic dysfunctions: Secondary | ICD-10-CM | POA: Diagnosis not present

## 2019-09-24 DIAGNOSIS — R41841 Cognitive communication deficit: Secondary | ICD-10-CM | POA: Diagnosis not present

## 2019-09-24 DIAGNOSIS — M6281 Muscle weakness (generalized): Secondary | ICD-10-CM | POA: Diagnosis not present

## 2019-09-25 ENCOUNTER — Other Ambulatory Visit: Payer: Self-pay

## 2019-09-25 ENCOUNTER — Encounter (HOSPITAL_COMMUNITY): Payer: Self-pay | Admitting: Physician Assistant

## 2019-09-25 ENCOUNTER — Ambulatory Visit (HOSPITAL_COMMUNITY)
Admission: RE | Admit: 2019-09-25 | Discharge: 2019-09-25 | Disposition: A | Discharge status: Home / Self Care | Payer: Medicare PPO | Source: Ambulatory Visit | Attending: Physician Assistant | Admitting: Physician Assistant

## 2019-09-25 VITALS — BP 114/60 | HR 75 | Ht 63.0 in | Wt 103.8 lb

## 2019-09-25 DIAGNOSIS — J449 Chronic obstructive pulmonary disease, unspecified: Secondary | ICD-10-CM | POA: Diagnosis not present

## 2019-09-25 DIAGNOSIS — Z8673 Personal history of transient ischemic attack (TIA), and cerebral infarction without residual deficits: Secondary | ICD-10-CM | POA: Diagnosis not present

## 2019-09-25 DIAGNOSIS — I4892 Unspecified atrial flutter: Secondary | ICD-10-CM | POA: Insufficient documentation

## 2019-09-25 DIAGNOSIS — D649 Anemia, unspecified: Secondary | ICD-10-CM | POA: Diagnosis not present

## 2019-09-25 DIAGNOSIS — F0390 Unspecified dementia without behavioral disturbance: Secondary | ICD-10-CM | POA: Diagnosis not present

## 2019-09-25 DIAGNOSIS — Z9181 History of falling: Secondary | ICD-10-CM | POA: Diagnosis not present

## 2019-09-25 DIAGNOSIS — Z8701 Personal history of pneumonia (recurrent): Secondary | ICD-10-CM | POA: Insufficient documentation

## 2019-09-25 DIAGNOSIS — Z881 Allergy status to other antibiotic agents status: Secondary | ICD-10-CM | POA: Diagnosis not present

## 2019-09-25 DIAGNOSIS — M810 Age-related osteoporosis without current pathological fracture: Secondary | ICD-10-CM | POA: Insufficient documentation

## 2019-09-25 DIAGNOSIS — Z79899 Other long term (current) drug therapy: Secondary | ICD-10-CM | POA: Diagnosis not present

## 2019-09-25 DIAGNOSIS — F419 Anxiety disorder, unspecified: Secondary | ICD-10-CM | POA: Diagnosis not present

## 2019-09-25 DIAGNOSIS — I5032 Chronic diastolic (congestive) heart failure: Secondary | ICD-10-CM | POA: Insufficient documentation

## 2019-09-25 DIAGNOSIS — Z87311 Personal history of (healed) other pathological fracture: Secondary | ICD-10-CM | POA: Diagnosis not present

## 2019-09-25 DIAGNOSIS — I110 Hypertensive heart disease with heart failure: Secondary | ICD-10-CM | POA: Insufficient documentation

## 2019-09-25 DIAGNOSIS — Z96643 Presence of artificial hip joint, bilateral: Secondary | ICD-10-CM | POA: Diagnosis not present

## 2019-09-25 DIAGNOSIS — Z8249 Family history of ischemic heart disease and other diseases of the circulatory system: Secondary | ICD-10-CM | POA: Diagnosis not present

## 2019-09-25 DIAGNOSIS — I4819 Other persistent atrial fibrillation: Secondary | ICD-10-CM | POA: Insufficient documentation

## 2019-09-25 DIAGNOSIS — D6869 Other thrombophilia: Secondary | ICD-10-CM | POA: Insufficient documentation

## 2019-09-25 DIAGNOSIS — Z7901 Long term (current) use of anticoagulants: Secondary | ICD-10-CM | POA: Insufficient documentation

## 2019-09-25 DIAGNOSIS — F329 Major depressive disorder, single episode, unspecified: Secondary | ICD-10-CM | POA: Insufficient documentation

## 2019-09-25 DIAGNOSIS — L309 Dermatitis, unspecified: Secondary | ICD-10-CM | POA: Diagnosis not present

## 2019-09-25 DIAGNOSIS — I480 Paroxysmal atrial fibrillation: Secondary | ICD-10-CM | POA: Insufficient documentation

## 2019-09-25 NOTE — Progress Notes (Signed)
Primary Care Physician: Rodrigo Ran, MD Primary Cardiologist: Dr Duke Salvia Primary Electrophysiologist: none Referring Physician: Dr Lorina Rabon Yolanda Baker is a 86 y.o. female with a history of paroxysmal atrial fibrillation, atrial flutter, chronic diastolic CHF, HTN, stroke, dementia, falls, anxiety, osteoporosis who presents for consultation in the [facility]. The patient was diagnosed with atrial fibrillation in 2015, initially not anticoagulated due to advanced age and frequent falls. She had a stroke treated with TPA in 2016 and was subsequently placed on Eliquis for anticoagulation. She was seen by our cardiology team 01/2019 for recurrent atrial fib. This appears to have been managed with a rate control strategy. She was admitted 09/10/2019 with increasing SOB. She was found to be hypoxic and tachycardic in AFib RVR.  She was treated with IV Lasix and rate control. Echo 09/11/19 showed mildly decreased LVEF at 45-50%, global hyopkinesis, normal PASP, moderate LAE, mild RAE, mild MR. She was started on amiodarone 200mg  BID. Patient is on Eliquis for a CHADS2VASC score of 8. She remains in rate controlled afib today and overall feels well. She denies SOB or palpitations. She is severely limited by her recent hip fracture.  Today, she denies symptoms of palpitations, chest pain, shortness of breath, orthopnea, PND, lower extremity edema, dizziness, presyncope, syncope, snoring, daytime somnolence, bleeding, or neurologic sequela. The patient is tolerating medications without difficulties and is otherwise without complaint today.    Atrial Fibrillation Risk Factors:  she does not have symptoms or diagnosis of sleep apnea. she does not have a history of rheumatic fever.   she has a BMI of Body mass index is 18.39 kg/m. Filed Weights   09/25/19 1059  Weight: 47.1 kg    Family History  Problem Relation Age of Onset  . Heart attack Father   . Hypertension  Father   . Hypertension Sister   . Stroke Sister   . Hypertension Brother      Atrial Fibrillation Management history:  Previous antiarrhythmic drugs: amiodarone Previous cardioversions: none Previous ablations: none CHADS2VASC score: 8 Anticoagulation history: Eliquis   Past Medical History:  Diagnosis Date  . Anemia   . Anxiety   . Arthritis    neck  . Bruises easily   . Chronic diastolic CHF (congestive heart failure) (HCC)   . COPD (chronic obstructive pulmonary disease) (HCC)   . Dementia (HCC)   . Depression   . Dysrhythmia    hx A-FLUTTER after hip surg April 2015- take metoprolol  . Eczema   . Falls   . Hip fracture (HCC)   . Hip joint pain    RT  . Hypertension   . Osteoporosis   . Persistent atrial fibrillation (HCC) 08/04/2019  . Pneumonia    august 2017  . Stroke [facility])    Past Surgical History:  Procedure Laterality Date  . ABDOMINAL HYSTERECTOMY    . FRACTURE SURGERY Right 06/05/2013   hip  . HIP ARTHROPLASTY Right 10/03/2013   Procedure: CONVERSION OF PREVIOUS RIGHT HIP SURGERY TO A RIGHT TOTAL HIP ARTHROPLASTY;  Surgeon: 12/03/2013, MD;  Location: [facility] ORS;  Service: Orthopedics;  Laterality: Right;  . INTRAMEDULLARY (IM) NAIL INTERTROCHANTERIC Right 06/05/2013   Procedure: INTRAMEDULLARY (IM) NAIL INTERTROCHANTRIC;  Surgeon: 08/05/2013, MD;  Location: [facility];  Service: Orthopedics;  Laterality: Right;  . JOINT REPLACEMENT     left hip  . left hip Left 2008  . LEG SURGERY Right    x2  Current Outpatient Medications  Medication Sig Dispense Refill  . acetaminophen (TYLENOL) 500 MG tablet Take 1 tablet (500 mg total) by mouth 2 (two) times daily as needed for mild pain or moderate pain. 30 tablet 0  . amiodarone (PACERONE) 200 MG tablet Take 1 tablet (200 mg total) by mouth 2 (two) times daily. 60 tablet 0  . amLODipine (NORVASC) 5 MG tablet Take 5 mg by mouth daily.    Marland Kitchen apixaban (ELIQUIS) 2.5 MG TABS tablet Take 1 tablet (2.5 mg  total) by mouth 2 (two) times daily. 60 tablet 2  . chlorhexidine (PERIDEX) 0.12 % solution Use as directed 15 mLs in the mouth or throat 2 (two) times daily. Swish and spit    . Chlorpheniramine-APAP (CORICIDIN) 2-325 MG TABS Take 1 tablet by mouth 2 (two) times daily as needed (congestion).    . citalopram (CELEXA) 20 MG tablet Take 1 tablet (20 mg total) by mouth daily. 30 tablet 0  . furosemide (LASIX) 20 MG tablet Take 1 tablet (20 mg total) by mouth every Monday, Wednesday, and Friday. 30 tablet 0  . galantamine (RAZADYNE ER) 8 MG 24 hr capsule Take 8 mg by mouth daily with breakfast.    . HYDROcodone-acetaminophen (NORCO/VICODIN) 5-325 MG tablet Take 1-2 tablets by mouth every 6 (six) hours as needed for moderate pain. 10 tablet 0  . loperamide (IMODIUM) 2 MG capsule Take 2 mg by mouth as needed for diarrhea or loose stools.     Marland Kitchen LORazepam (ATIVAN) 0.5 MG tablet Take 1 tablet (0.5 mg total) by mouth 2 (two) times daily. (Patient taking differently: Take 0.5 mg by mouth daily. ) 30 tablet 0  . Metoprolol Tartrate 75 MG TABS Take 75 mg by mouth 2 (two) times daily. 60 tablet 0  . Multiple Vitamins-Minerals (CERTAVITE SENIOR/ANTIOXIDANT PO) Take 1 tablet by mouth daily.     Bertram Gala Glycol-Propyl Glycol (SYSTANE) 0.4-0.3 % SOLN Place 1 drop into both eyes every hour as needed (dry eyes/irritation).    . potassium chloride SA (K-DUR,KLOR-CON) 20 MEQ tablet Take 20 mEq by mouth every other day.    . psyllium (REGULOID) 0.52 g capsule Take 0.52 g by mouth daily.    . simvastatin (ZOCOR) 10 MG tablet Take 1 tablet (10 mg total) by mouth daily at 6 PM. 30 tablet 2  . STIOLTO RESPIMAT 2.5-2.5 MCG/ACT AERS Inhale 2 puffs into the lungs daily.    . ziprasidone (GEODON) 20 MG capsule TAKE ONE CAPSULE BY MOUTH DAILY IN THE AFTERNOON **DO NOT CRUSH** **NO REFILLS** (Patient taking differently: Take 20 mg by mouth daily. ) 30 capsule 2  . saline (AYR) GEL Place 1 application into both nostrils at  bedtime. (Patient not taking: Reported on 09/10/2019)     No current facility-administered medications for this encounter.    Allergies  Allergen Reactions  . Flagyl [Metronidazole]     Fingers get "numb"    Social History   Socioeconomic History  . Marital status: Widowed    Spouse name: Not on file  . Number of children: 3  . Years of education: 80yr colleg  . Highest education level: Not on file  Occupational History  . Occupation: Retired   Tobacco Use  . Smoking status: Never Smoker  . Smokeless tobacco: Never Used  Vaping Use  . Vaping Use: Never used  Substance and Sexual Activity  . Alcohol use: No    Alcohol/week: 0.0 standard drinks  . Drug use: No  . Sexual activity:  Never  Other Topics Concern  . Not on file  Social History Narrative   Drinks about 3 cups of coffee a day    Social Determinants of Health   Financial Resource Strain:   . Difficulty of Paying Living Expenses:   Food Insecurity:   . Worried About Programme researcher, broadcasting/film/video in the Last Year:   . Barista in the Last Year:   Transportation Needs:   . Freight forwarder (Medical):   Marland Kitchen Lack of Transportation (Non-Medical):   Physical Activity:   . Days of Exercise per Week:   . Minutes of Exercise per Session:   Stress:   . Feeling of Stress :   Social Connections:   . Frequency of Communication with Friends and Family:   . Frequency of Social Gatherings with Friends and Family:   . Attends Religious Services:   . Active Member of Clubs or Organizations:   . Attends Banker Meetings:   Marland Kitchen Marital Status:   Intimate Partner Violence:   . Fear of Current or Ex-Partner:   . Emotionally Abused:   Marland Kitchen Physically Abused:   . Sexually Abused:      ROS- All systems are reviewed and negative except as per the HPI above.  Physical Exam: Vitals:   09/25/19 1059  BP: (!) 114/60  Pulse: 75  Weight: 47.1 kg  Height: 5\' 3"  (1.6 m)    GEN- The patient is well appearing  elderly female, alert and oriented x 3 today.   Head- normocephalic, atraumatic Eyes-  Sclera clear, conjunctiva pink Ears- hearing intact Oropharynx- clear Neck- supple  Lungs- Clear to ausculation bilaterally, normal work of breathing Heart- irregular rate and rhythm, no murmurs, rubs or gallops  GI- soft, NT, ND, + BS Extremities- no clubbing, cyanosis, or edema. R hip brace  MS- no significant deformity or atrophy Skin- no rash or lesion Psych- euthymic mood, full affect Neuro- strength and sensation are intact  Wt Readings from Last 3 Encounters:  09/25/19 47.1 kg  09/15/19 48.4 kg  08/05/19 45.4 kg    EKG today demonstrates afib HR 75, LAFB, QRS 66, QTc 424  Echo 09/11/19 demonstrated  1. Left ventricular ejection fraction, by estimation, is 45 to 50%. The  left ventricle has mildly decreased function. The left ventricle  demonstrates global hypokinesis. There is mild left ventricular  hypertrophy. Left ventricular diastolic parameters  are indeterminate.  2. Right ventricular systolic function is normal. The right ventricular  size is normal. There is normal pulmonary artery systolic pressure. The  estimated right ventricular systolic pressure is 27.6 mmHg.  3. Left atrial size was moderately dilated.  4. Right atrial size was mildly dilated.  5. The mitral valve is normal in structure. Mild mitral valve  regurgitation. No evidence of mitral stenosis.  6. Tricuspid valve regurgitation is moderate.  7. The aortic valve is tricuspid. Aortic valve regurgitation is not  visualized. Mild aortic valve sclerosis is present, with no evidence of  aortic valve stenosis.  8. The inferior vena cava is normal in size with greater than 50%  respiratory variability, suggesting right atrial pressure of 3 mmHg.   Epic records are reviewed at length today  CHA2DS2-VASc Score = 8  The patient's score is based upon: CHF History: 1 HTN History: 1 Age : 2 Diabetes History:  0 Stroke History: 2 Vascular Disease History: 1 Gender: 1      ASSESSMENT AND PLAN: 1. Persistent Atrial Fibrillation (ICD10:  I48.19) The patient's CHA2DS2-VASc score is 8, indicating a 10.8% annual risk of stroke. Patient in rate controlled afib today. Asymptomatic.  We discussed therapeutic options today.  Continue amiodarone 200 mg BID for another 2 weeks then decrease to 200 mg daily.  Could consider DCCV if she remains persistent. Although if she does not have recurrence of CHF with adequate rate control, could pursue rate control strategy given her age and significant comorbidities. Patient and family would like to discuss with primary cardiologist Dr Duke Salvia. Hopefully, she will convert to SR on the medication.  Continue Lopressor 75 mg BID  2. Secondary Hypercoagulable State (ICD10:  D68.69) The patient is at significant risk for stroke/thromboembolism based upon her CHA2DS2-VASc Score of 8.  Continue Apixaban (Eliquis).   3. HTN Stable, no changes today.   Follow up with Dr Duke Salvia as scheduled.    Jorja Loa PA-C [facility] [address] [place], [place] [address] [phone] 09/25/2019 11:10 AM

## 2019-09-28 DIAGNOSIS — S72301D Unspecified fracture of shaft of right femur, subsequent encounter for closed fracture with routine healing: Secondary | ICD-10-CM | POA: Diagnosis not present

## 2019-09-28 DIAGNOSIS — M6281 Muscle weakness (generalized): Secondary | ICD-10-CM | POA: Diagnosis not present

## 2019-09-29 DIAGNOSIS — R41841 Cognitive communication deficit: Secondary | ICD-10-CM | POA: Diagnosis not present

## 2019-09-29 DIAGNOSIS — R488 Other symbolic dysfunctions: Secondary | ICD-10-CM | POA: Diagnosis not present

## 2019-09-30 DIAGNOSIS — E876 Hypokalemia: Secondary | ICD-10-CM | POA: Diagnosis not present

## 2019-09-30 DIAGNOSIS — S72301D Unspecified fracture of shaft of right femur, subsequent encounter for closed fracture with routine healing: Secondary | ICD-10-CM | POA: Diagnosis not present

## 2019-09-30 DIAGNOSIS — R488 Other symbolic dysfunctions: Secondary | ICD-10-CM | POA: Diagnosis not present

## 2019-09-30 DIAGNOSIS — R41841 Cognitive communication deficit: Secondary | ICD-10-CM | POA: Diagnosis not present

## 2019-09-30 DIAGNOSIS — F419 Anxiety disorder, unspecified: Secondary | ICD-10-CM | POA: Diagnosis not present

## 2019-09-30 DIAGNOSIS — M6281 Muscle weakness (generalized): Secondary | ICD-10-CM | POA: Diagnosis not present

## 2019-09-30 DIAGNOSIS — K5909 Other constipation: Secondary | ICD-10-CM | POA: Diagnosis not present

## 2019-10-01 DIAGNOSIS — R41841 Cognitive communication deficit: Secondary | ICD-10-CM | POA: Diagnosis not present

## 2019-10-01 DIAGNOSIS — R488 Other symbolic dysfunctions: Secondary | ICD-10-CM | POA: Diagnosis not present

## 2019-10-02 DIAGNOSIS — M6281 Muscle weakness (generalized): Secondary | ICD-10-CM | POA: Diagnosis not present

## 2019-10-02 DIAGNOSIS — S72301D Unspecified fracture of shaft of right femur, subsequent encounter for closed fracture with routine healing: Secondary | ICD-10-CM | POA: Diagnosis not present

## 2019-10-02 DIAGNOSIS — R488 Other symbolic dysfunctions: Secondary | ICD-10-CM | POA: Diagnosis not present

## 2019-10-05 DIAGNOSIS — S72301D Unspecified fracture of shaft of right femur, subsequent encounter for closed fracture with routine healing: Secondary | ICD-10-CM | POA: Diagnosis not present

## 2019-10-05 DIAGNOSIS — R488 Other symbolic dysfunctions: Secondary | ICD-10-CM | POA: Diagnosis not present

## 2019-10-05 DIAGNOSIS — M6281 Muscle weakness (generalized): Secondary | ICD-10-CM | POA: Diagnosis not present

## 2019-10-06 DIAGNOSIS — R41841 Cognitive communication deficit: Secondary | ICD-10-CM | POA: Diagnosis not present

## 2019-10-06 DIAGNOSIS — M6281 Muscle weakness (generalized): Secondary | ICD-10-CM | POA: Diagnosis not present

## 2019-10-06 DIAGNOSIS — R488 Other symbolic dysfunctions: Secondary | ICD-10-CM | POA: Diagnosis not present

## 2019-10-07 DIAGNOSIS — M6281 Muscle weakness (generalized): Secondary | ICD-10-CM | POA: Diagnosis not present

## 2019-10-07 DIAGNOSIS — S72301D Unspecified fracture of shaft of right femur, subsequent encounter for closed fracture with routine healing: Secondary | ICD-10-CM | POA: Diagnosis not present

## 2019-10-08 DIAGNOSIS — R41841 Cognitive communication deficit: Secondary | ICD-10-CM | POA: Diagnosis not present

## 2019-10-08 DIAGNOSIS — M6281 Muscle weakness (generalized): Secondary | ICD-10-CM | POA: Diagnosis not present

## 2019-10-08 DIAGNOSIS — R488 Other symbolic dysfunctions: Secondary | ICD-10-CM | POA: Diagnosis not present

## 2019-10-08 DIAGNOSIS — S72301D Unspecified fracture of shaft of right femur, subsequent encounter for closed fracture with routine healing: Secondary | ICD-10-CM | POA: Diagnosis not present

## 2019-10-09 DIAGNOSIS — R41841 Cognitive communication deficit: Secondary | ICD-10-CM | POA: Diagnosis not present

## 2019-10-09 DIAGNOSIS — R488 Other symbolic dysfunctions: Secondary | ICD-10-CM | POA: Diagnosis not present

## 2019-10-12 DIAGNOSIS — M6281 Muscle weakness (generalized): Secondary | ICD-10-CM | POA: Diagnosis not present

## 2019-10-12 DIAGNOSIS — R488 Other symbolic dysfunctions: Secondary | ICD-10-CM | POA: Diagnosis not present

## 2019-10-13 DIAGNOSIS — R41841 Cognitive communication deficit: Secondary | ICD-10-CM | POA: Diagnosis not present

## 2019-10-13 DIAGNOSIS — S72301D Unspecified fracture of shaft of right femur, subsequent encounter for closed fracture with routine healing: Secondary | ICD-10-CM | POA: Diagnosis not present

## 2019-10-13 DIAGNOSIS — R488 Other symbolic dysfunctions: Secondary | ICD-10-CM | POA: Diagnosis not present

## 2019-10-13 DIAGNOSIS — M6281 Muscle weakness (generalized): Secondary | ICD-10-CM | POA: Diagnosis not present

## 2019-10-14 DIAGNOSIS — M6281 Muscle weakness (generalized): Secondary | ICD-10-CM | POA: Diagnosis not present

## 2019-10-14 DIAGNOSIS — S72301D Unspecified fracture of shaft of right femur, subsequent encounter for closed fracture with routine healing: Secondary | ICD-10-CM | POA: Diagnosis not present

## 2019-10-15 DIAGNOSIS — M6281 Muscle weakness (generalized): Secondary | ICD-10-CM | POA: Diagnosis not present

## 2019-10-15 DIAGNOSIS — S72301D Unspecified fracture of shaft of right femur, subsequent encounter for closed fracture with routine healing: Secondary | ICD-10-CM | POA: Diagnosis not present

## 2019-10-15 NOTE — Telephone Encounter (Signed)
Patient was admitted after this message and had follow up labs

## 2019-10-16 ENCOUNTER — Emergency Department (HOSPITAL_COMMUNITY): Payer: Medicare PPO

## 2019-10-16 ENCOUNTER — Other Ambulatory Visit: Payer: Self-pay

## 2019-10-16 ENCOUNTER — Encounter (HOSPITAL_COMMUNITY): Payer: Self-pay | Admitting: Emergency Medicine

## 2019-10-16 ENCOUNTER — Telehealth: Payer: Self-pay | Admitting: Cardiovascular Disease

## 2019-10-16 ENCOUNTER — Inpatient Hospital Stay (HOSPITAL_COMMUNITY)
Admission: EM | Admit: 2019-10-16 | Discharge: 2019-10-20 | DRG: 291 | Disposition: A | Discharge status: Nursing Home (Includes ICF) | Payer: Medicare PPO | Source: Skilled Nursing Facility | Attending: Internal Medicine | Admitting: Internal Medicine

## 2019-10-16 DIAGNOSIS — Z883 Allergy status to other anti-infective agents status: Secondary | ICD-10-CM

## 2019-10-16 DIAGNOSIS — R0602 Shortness of breath: Secondary | ICD-10-CM

## 2019-10-16 DIAGNOSIS — J9601 Acute respiratory failure with hypoxia: Secondary | ICD-10-CM

## 2019-10-16 DIAGNOSIS — R001 Bradycardia, unspecified: Secondary | ICD-10-CM | POA: Diagnosis not present

## 2019-10-16 DIAGNOSIS — F0150 Vascular dementia without behavioral disturbance: Secondary | ICD-10-CM | POA: Diagnosis not present

## 2019-10-16 DIAGNOSIS — J9811 Atelectasis: Secondary | ICD-10-CM | POA: Diagnosis not present

## 2019-10-16 DIAGNOSIS — Z66 Do not resuscitate: Secondary | ICD-10-CM | POA: Diagnosis not present

## 2019-10-16 DIAGNOSIS — Z7901 Long term (current) use of anticoagulants: Secondary | ICD-10-CM

## 2019-10-16 DIAGNOSIS — R7989 Other specified abnormal findings of blood chemistry: Secondary | ICD-10-CM | POA: Diagnosis present

## 2019-10-16 DIAGNOSIS — M2550 Pain in unspecified joint: Secondary | ICD-10-CM | POA: Diagnosis not present

## 2019-10-16 DIAGNOSIS — S7290XK Unspecified fracture of unspecified femur, subsequent encounter for closed fracture with nonunion: Secondary | ICD-10-CM

## 2019-10-16 DIAGNOSIS — R0902 Hypoxemia: Secondary | ICD-10-CM | POA: Diagnosis not present

## 2019-10-16 DIAGNOSIS — Z8249 Family history of ischemic heart disease and other diseases of the circulatory system: Secondary | ICD-10-CM | POA: Diagnosis not present

## 2019-10-16 DIAGNOSIS — Z7401 Bed confinement status: Secondary | ICD-10-CM | POA: Diagnosis not present

## 2019-10-16 DIAGNOSIS — E876 Hypokalemia: Secondary | ICD-10-CM | POA: Diagnosis present

## 2019-10-16 DIAGNOSIS — J9690 Respiratory failure, unspecified, unspecified whether with hypoxia or hypercapnia: Secondary | ICD-10-CM | POA: Diagnosis not present

## 2019-10-16 DIAGNOSIS — I480 Paroxysmal atrial fibrillation: Secondary | ICD-10-CM | POA: Diagnosis present

## 2019-10-16 DIAGNOSIS — S7291XK Unspecified fracture of right femur, subsequent encounter for closed fracture with nonunion: Secondary | ICD-10-CM | POA: Diagnosis not present

## 2019-10-16 DIAGNOSIS — F0390 Unspecified dementia without behavioral disturbance: Secondary | ICD-10-CM | POA: Diagnosis present

## 2019-10-16 DIAGNOSIS — I10 Essential (primary) hypertension: Secondary | ICD-10-CM | POA: Diagnosis present

## 2019-10-16 DIAGNOSIS — Z90710 Acquired absence of both cervix and uterus: Secondary | ICD-10-CM | POA: Diagnosis not present

## 2019-10-16 DIAGNOSIS — F411 Generalized anxiety disorder: Secondary | ICD-10-CM | POA: Diagnosis present

## 2019-10-16 DIAGNOSIS — Z96641 Presence of right artificial hip joint: Secondary | ICD-10-CM | POA: Diagnosis present

## 2019-10-16 DIAGNOSIS — I700 Atherosclerosis of aorta: Secondary | ICD-10-CM | POA: Diagnosis not present

## 2019-10-16 DIAGNOSIS — E785 Hyperlipidemia, unspecified: Secondary | ICD-10-CM | POA: Diagnosis present

## 2019-10-16 DIAGNOSIS — I509 Heart failure, unspecified: Secondary | ICD-10-CM

## 2019-10-16 DIAGNOSIS — I517 Cardiomegaly: Secondary | ICD-10-CM | POA: Diagnosis not present

## 2019-10-16 DIAGNOSIS — N1831 Chronic kidney disease, stage 3a: Secondary | ICD-10-CM | POA: Diagnosis present

## 2019-10-16 DIAGNOSIS — Z20822 Contact with and (suspected) exposure to covid-19: Secondary | ICD-10-CM | POA: Diagnosis not present

## 2019-10-16 DIAGNOSIS — I071 Rheumatic tricuspid insufficiency: Secondary | ICD-10-CM | POA: Diagnosis present

## 2019-10-16 DIAGNOSIS — Z79899 Other long term (current) drug therapy: Secondary | ICD-10-CM

## 2019-10-16 DIAGNOSIS — I130 Hypertensive heart and chronic kidney disease with heart failure and stage 1 through stage 4 chronic kidney disease, or unspecified chronic kidney disease: Secondary | ICD-10-CM | POA: Diagnosis not present

## 2019-10-16 DIAGNOSIS — R000 Tachycardia, unspecified: Secondary | ICD-10-CM | POA: Diagnosis not present

## 2019-10-16 DIAGNOSIS — F329 Major depressive disorder, single episode, unspecified: Secondary | ICD-10-CM | POA: Diagnosis present

## 2019-10-16 DIAGNOSIS — Z8673 Personal history of transient ischemic attack (TIA), and cerebral infarction without residual deficits: Secondary | ICD-10-CM

## 2019-10-16 DIAGNOSIS — M810 Age-related osteoporosis without current pathological fracture: Secondary | ICD-10-CM | POA: Diagnosis present

## 2019-10-16 DIAGNOSIS — Z743 Need for continuous supervision: Secondary | ICD-10-CM | POA: Diagnosis not present

## 2019-10-16 DIAGNOSIS — M6281 Muscle weakness (generalized): Secondary | ICD-10-CM | POA: Diagnosis not present

## 2019-10-16 DIAGNOSIS — I4892 Unspecified atrial flutter: Secondary | ICD-10-CM | POA: Diagnosis present

## 2019-10-16 DIAGNOSIS — I959 Hypotension, unspecified: Secondary | ICD-10-CM | POA: Diagnosis not present

## 2019-10-16 DIAGNOSIS — J90 Pleural effusion, not elsewhere classified: Secondary | ICD-10-CM | POA: Diagnosis not present

## 2019-10-16 DIAGNOSIS — Z8701 Personal history of pneumonia (recurrent): Secondary | ICD-10-CM

## 2019-10-16 DIAGNOSIS — J449 Chronic obstructive pulmonary disease, unspecified: Secondary | ICD-10-CM | POA: Diagnosis present

## 2019-10-16 DIAGNOSIS — I5023 Acute on chronic systolic (congestive) heart failure: Secondary | ICD-10-CM | POA: Diagnosis present

## 2019-10-16 DIAGNOSIS — I4891 Unspecified atrial fibrillation: Secondary | ICD-10-CM | POA: Diagnosis not present

## 2019-10-16 DIAGNOSIS — Z993 Dependence on wheelchair: Secondary | ICD-10-CM

## 2019-10-16 DIAGNOSIS — R488 Other symbolic dysfunctions: Secondary | ICD-10-CM | POA: Diagnosis not present

## 2019-10-16 DIAGNOSIS — Z823 Family history of stroke: Secondary | ICD-10-CM

## 2019-10-16 DIAGNOSIS — F32A Depression, unspecified: Secondary | ICD-10-CM | POA: Diagnosis present

## 2019-10-16 LAB — I-STAT ARTERIAL BLOOD GAS, ED
Acid-Base Excess: 5 mmol/L — ABNORMAL HIGH (ref 0.0–2.0)
Bicarbonate: 27.8 mmol/L (ref 20.0–28.0)
Calcium, Ion: 1.13 mmol/L — ABNORMAL LOW (ref 1.15–1.40)
HCT: 36 % (ref 36.0–46.0)
Hemoglobin: 12.2 g/dL (ref 12.0–15.0)
O2 Saturation: 98 %
Potassium: 3.3 mmol/L — ABNORMAL LOW (ref 3.5–5.1)
Sodium: 137 mmol/L (ref 135–145)
TCO2: 29 mmol/L (ref 22–32)
pCO2 arterial: 34.1 mmHg (ref 32.0–48.0)
pH, Arterial: 7.52 — ABNORMAL HIGH (ref 7.350–7.450)
pO2, Arterial: 90 mmHg (ref 83.0–108.0)

## 2019-10-16 LAB — URINALYSIS, ROUTINE W REFLEX MICROSCOPIC
Bilirubin Urine: NEGATIVE
Glucose, UA: NEGATIVE mg/dL
Hgb urine dipstick: NEGATIVE
Ketones, ur: NEGATIVE mg/dL
Leukocytes,Ua: NEGATIVE
Nitrite: NEGATIVE
Protein, ur: NEGATIVE mg/dL
Specific Gravity, Urine: 1.019 (ref 1.005–1.030)
pH: 7 (ref 5.0–8.0)

## 2019-10-16 LAB — PHOSPHORUS: Phosphorus: 3.6 mg/dL (ref 2.5–4.6)

## 2019-10-16 LAB — BASIC METABOLIC PANEL
Anion gap: 10 (ref 5–15)
BUN: 18 mg/dL (ref 8–23)
CO2: 28 mmol/L (ref 22–32)
Calcium: 9 mg/dL (ref 8.9–10.3)
Chloride: 101 mmol/L (ref 98–111)
Creatinine, Ser: 1.06 mg/dL — ABNORMAL HIGH (ref 0.44–1.00)
GFR calc Af Amer: 52 mL/min — ABNORMAL LOW (ref >60)
GFR calc non Af Amer: 45 mL/min — ABNORMAL LOW (ref >60)
Glucose, Bld: 100 mg/dL — ABNORMAL HIGH (ref 70–99)
Potassium: 3.8 mmol/L (ref 3.5–5.1)
Sodium: 139 mmol/L (ref 135–145)

## 2019-10-16 LAB — CBC
HCT: 39.7 % (ref 36.0–46.0)
Hemoglobin: 12.5 g/dL (ref 12.0–15.0)
MCH: 31.2 pg (ref 26.0–34.0)
MCHC: 31.5 g/dL (ref 30.0–36.0)
MCV: 99 fL (ref 80.0–100.0)
Platelets: 273 10*3/uL (ref 150–400)
RBC: 4.01 MIL/uL (ref 3.87–5.11)
RDW: 15.3 % (ref 11.5–15.5)
WBC: 7.1 10*3/uL (ref 4.0–10.5)
nRBC: 0 % (ref 0.0–0.2)

## 2019-10-16 LAB — SARS CORONAVIRUS 2 BY RT PCR (HOSPITAL ORDER, PERFORMED IN ~~LOC~~ HOSPITAL LAB): SARS Coronavirus 2: NEGATIVE

## 2019-10-16 LAB — BRAIN NATRIURETIC PEPTIDE: B Natriuretic Peptide: 415.6 pg/mL — ABNORMAL HIGH (ref 0.0–100.0)

## 2019-10-16 LAB — TROPONIN I (HIGH SENSITIVITY)
Troponin I (High Sensitivity): 8 ng/L (ref <18)
Troponin I (High Sensitivity): 6 ng/L (ref <18)

## 2019-10-16 LAB — D-DIMER, QUANTITATIVE: D-Dimer, Quant: 1.59 ug/mL-FEU — ABNORMAL HIGH (ref 0.00–0.50)

## 2019-10-16 LAB — MAGNESIUM: Magnesium: 1.7 mg/dL (ref 1.7–2.4)

## 2019-10-16 MED ORDER — CITALOPRAM HYDROBROMIDE 20 MG PO TABS
20.0000 mg | ORAL_TABLET | Freq: Every day | ORAL | Status: DC
  Administered 2019-10-17 – 2019-10-20 (×4): 20 mg via ORAL
  Filled 2019-10-16 (×4): qty 1

## 2019-10-16 MED ORDER — ACETAMINOPHEN 650 MG RE SUPP: 650.0000 mg | Freq: Four times a day (QID) | RECTAL | Status: DC | PRN

## 2019-10-16 MED ORDER — LORAZEPAM 0.5 MG PO TABS
0.5000 mg | ORAL_TABLET | Freq: Every day | ORAL | Status: DC
  Administered 2019-10-16 – 2019-10-20 (×5): 0.5 mg via ORAL
  Filled 2019-10-16 (×5): qty 1

## 2019-10-16 MED ORDER — ONDANSETRON HCL 4 MG PO TABS: 4.0000 mg | ORAL_TABLET | Freq: Four times a day (QID) | ORAL | Status: DC | PRN

## 2019-10-16 MED ORDER — FUROSEMIDE 10 MG/ML IJ SOLN
40.0000 mg | Freq: Once | INTRAMUSCULAR | Status: DC
  Filled 2019-10-16: qty 4

## 2019-10-16 MED ORDER — ACETAMINOPHEN 325 MG PO TABS: 650.0000 mg | ORAL_TABLET | Freq: Four times a day (QID) | ORAL | Status: DC | PRN

## 2019-10-16 MED ORDER — FUROSEMIDE 10 MG/ML IJ SOLN
40.0000 mg | Freq: Every day | INTRAMUSCULAR | Status: DC
  Administered 2019-10-16 – 2019-10-19 (×4): 40 mg via INTRAVENOUS
  Filled 2019-10-16 (×3): qty 4

## 2019-10-16 MED ORDER — METOPROLOL TARTRATE 25 MG PO TABS
75.0000 mg | ORAL_TABLET | Freq: Two times a day (BID) | ORAL | Status: DC
  Administered 2019-10-16 – 2019-10-20 (×8): 75 mg via ORAL
  Filled 2019-10-16 (×9): qty 3

## 2019-10-16 MED ORDER — ZIPRASIDONE HCL 20 MG PO CAPS
20.0000 mg | ORAL_CAPSULE | Freq: Every day | ORAL | Status: DC
  Administered 2019-10-17 – 2019-10-20 (×4): 20 mg via ORAL
  Filled 2019-10-16 (×4): qty 1

## 2019-10-16 MED ORDER — APIXABAN 2.5 MG PO TABS
2.5000 mg | ORAL_TABLET | Freq: Two times a day (BID) | ORAL | Status: DC
  Administered 2019-10-16 – 2019-10-20 (×9): 2.5 mg via ORAL
  Filled 2019-10-16 (×11): qty 1

## 2019-10-16 MED ORDER — ALBUTEROL SULFATE (2.5 MG/3ML) 0.083% IN NEBU: 2.5000 mg | INHALATION_SOLUTION | RESPIRATORY_TRACT | Status: DC | PRN

## 2019-10-16 MED ORDER — AMLODIPINE BESYLATE 5 MG PO TABS
5.0000 mg | ORAL_TABLET | Freq: Every day | ORAL | Status: DC
  Administered 2019-10-17 – 2019-10-19 (×3): 5 mg via ORAL
  Filled 2019-10-16 (×4): qty 1

## 2019-10-16 MED ORDER — HYDROCODONE-ACETAMINOPHEN 5-325 MG PO TABS
1.0000 | ORAL_TABLET | Freq: Four times a day (QID) | ORAL | Status: DC | PRN
  Administered 2019-10-16: 2 via ORAL
  Filled 2019-10-16 (×2): qty 2

## 2019-10-16 MED ORDER — AMIODARONE HCL 200 MG PO TABS
200.0000 mg | ORAL_TABLET | Freq: Every day | ORAL | Status: DC
  Administered 2019-10-17 – 2019-10-20 (×4): 200 mg via ORAL
  Filled 2019-10-16 (×4): qty 1

## 2019-10-16 MED ORDER — ACETAMINOPHEN 500 MG PO TABS: 500.0000 mg | ORAL_TABLET | Freq: Two times a day (BID) | ORAL | Status: DC | PRN

## 2019-10-16 MED ORDER — SIMVASTATIN 20 MG PO TABS
10.0000 mg | ORAL_TABLET | Freq: Every day | ORAL | Status: DC
  Administered 2019-10-16 – 2019-10-20 (×5): 10 mg via ORAL
  Filled 2019-10-16 (×5): qty 1

## 2019-10-16 MED ORDER — ONDANSETRON HCL 4 MG/2ML IJ SOLN: 4.0000 mg | Freq: Four times a day (QID) | INTRAMUSCULAR | Status: DC | PRN

## 2019-10-16 MED ORDER — GALANTAMINE HYDROBROMIDE ER 8 MG PO CP24
8.0000 mg | ORAL_CAPSULE | Freq: Every day | ORAL | Status: DC
  Administered 2019-10-17 – 2019-10-20 (×4): 8 mg via ORAL
  Filled 2019-10-16 (×5): qty 1

## 2019-10-16 MED ORDER — POTASSIUM CHLORIDE 20 MEQ PO PACK
40.0000 meq | PACK | Freq: Once | ORAL | Status: AC
  Administered 2019-10-16: 40 meq via ORAL
  Filled 2019-10-16: qty 2

## 2019-10-16 MED ORDER — IOHEXOL 350 MG/ML SOLN
60.0000 mL | Freq: Once | INTRAVENOUS | Status: AC | PRN
  Administered 2019-10-16: 60 mL via INTRAVENOUS

## 2019-10-16 NOTE — Telephone Encounter (Signed)
New Message  Pts daughter is calling and says the pt is on her [place] to [facility] by ambulance She says the pt is in Afib and is experiencing SOB  She is wondering if Dr Duke Salvia or someone else from our group will se the patient today   Please advise

## 2019-10-16 NOTE — ED Notes (Signed)
Dinner tray ordered.

## 2019-10-16 NOTE — Telephone Encounter (Signed)
The ED will call our inpatient team

## 2019-10-16 NOTE — H&P (Signed)
History and Physical    Yolanda Baker SWH:[id number] DOB: 10-07-1923 DOA: 10/16/2019  PCP: Rodrigo Ran, MD  Patient coming from: [facility]  I have personally briefly reviewed patient's old medical records in [facility]  Chief Complaint: Shortness of breath  HPI: Yolanda Baker is a 86 y.o. female with medical history significant of paroxysmal A. fib-on Eliquis, hypertension, hyperlipidemia, depression/anxiety, dementia, systolic CHF with ejection fraction of 45 to 50%, arthritis, CVA, osteoporosis presents to emergency department with shortness of breath and hypoxia.  Patient's daughter at bedside is the historian. She tells me that patient was doing fine this morning however she suddenly developed shortness of breath after PT/OT and was noted to be hypoxic with oxygen saturation of 88% on room air and improved from 2 L of oxygen via nasal cannula. Patient brought to emergency department for further evaluation and management.  Patient takes Lasix 20 mg every other day, reports chronic dry cough, chronic orthopnea however denies leg swelling, PND, chest pain, wheezing, fever, chills, nausea, vomiting, abdominal pain, headache, blurry vision, lightheadedness, dizziness, urinary or bowel changes.   She has history of hip fracture which managed conservatively with brace. She is not a candidate for surgery.  No history of smoking, alcohol, street drug use. She is wheelchair-bound.  ED Course: Upon arrival to ED: Patient afebrile, respiratory rate noted to be in 20s, requiring 2 L of oxygen via nasal cannula. Initial labs including CBC, troponin: WNL, COVID-19 negative, BNP: 415, D-dimer: 1.59, CT angio of chest was obtained which came back negative for PE, small bilateral pleural effusion. [facility] hospital[facility] consulted for admission for acute hypoxemic respiratory failure likely secondary to fluid overload/CHF exacerbation.  Review of Systems: As per HPI otherwise negative.    Past  Medical History:  Diagnosis Date  . Anemia   . Anxiety   . Arthritis    neck  . Bruises easily   . Chronic diastolic CHF (congestive heart failure) (HCC)   . COPD (chronic obstructive pulmonary disease) (HCC)   . Dementia (HCC)   . Depression   . Dysrhythmia    hx A-FLUTTER after hip surg April 2015- take metoprolol  . Eczema   . Falls   . Hip fracture (HCC)   . Hip joint pain    RT  . Hypertension   . Osteoporosis   . Persistent atrial fibrillation (HCC) 08/04/2019  . Pneumonia    august 2017  . Stroke [facility])     Past Surgical History:  Procedure Laterality Date  . ABDOMINAL HYSTERECTOMY    . FRACTURE SURGERY Right 06/05/2013   hip  . HIP ARTHROPLASTY Right 10/03/2013   Procedure: CONVERSION OF PREVIOUS RIGHT HIP SURGERY TO A RIGHT TOTAL HIP ARTHROPLASTY;  Surgeon: Loanne Drilling, MD;  Location: [facility] ORS;  Service: Orthopedics;  Laterality: Right;  . INTRAMEDULLARY (IM) NAIL INTERTROCHANTERIC Right 06/05/2013   Procedure: INTRAMEDULLARY (IM) NAIL INTERTROCHANTRIC;  Surgeon: Loanne Drilling, MD;  Location: [facility] ORS;  Service: Orthopedics;  Laterality: Right;  . JOINT REPLACEMENT     left hip  . left hip Left 2008  . LEG SURGERY Right    x2     reports that she has never smoked. She has never used smokeless tobacco. She reports that she does not drink alcohol and does not use drugs.  Allergies  Allergen Reactions  . Flagyl [Metronidazole]     Fingers get "numb"    Family History  Problem Relation Age of Onset  . Heart attack  Father   . Hypertension Father   . Hypertension Sister   . Stroke Sister   . Hypertension Brother     Prior to Admission medications   Medication Sig Start Date End Date Taking? Authorizing Provider  acetaminophen (TYLENOL) 500 MG tablet Take 1 tablet (500 mg total) by mouth 2 (two) times daily as needed for mild pain or moderate pain. 03/02/15  Yes Leta Baptist, MD  amiodarone (PACERONE) 200 MG tablet Take 1 tablet (200 mg total) by mouth  2 (two) times daily. Patient taking differently: Take 200 mg by mouth daily.  09/15/19  Yes Swayze, Ava, DO  amLODipine (NORVASC) 5 MG tablet Take 5 mg by mouth daily.   Yes [provider]  apixaban (ELIQUIS) 2.5 MG TABS tablet Take 1 tablet (2.5 mg total) by mouth 2 (two) times daily. 07/01/14  Yes Layne Benton, NP  chlorhexidine (PERIDEX) 0.12 % solution Use as directed 15 mLs in the mouth or throat 2 (two) times daily. Swish and spit   Yes [provider]  Chlorpheniramine-APAP (CORICIDIN) 2-325 MG TABS Take 1 tablet by mouth 2 (two) times daily as needed (congestion).   Yes [provider]  citalopram (CELEXA) 20 MG tablet Take 1 tablet (20 mg total) by mouth daily. 09/15/19  Yes Swayze, Ava, DO  furosemide (LASIX) 20 MG tablet Take 1 tablet (20 mg total) by mouth every Monday, Wednesday, and Friday. 09/16/19  Yes Swayze, Ava, DO  galantamine (RAZADYNE ER) 8 MG 24 hr capsule Take 8 mg by mouth daily with breakfast.   Yes [provider]  HYDROcodone-acetaminophen (NORCO/VICODIN) 5-325 MG tablet Take 1-2 tablets by mouth every 6 (six) hours as needed for moderate pain. 05/10/19  Yes Pokhrel, Laxman, MD  loperamide (IMODIUM) 2 MG capsule Take 2 mg by mouth as needed for diarrhea or loose stools.  07/14/19  Yes [provider]  LORazepam (ATIVAN) 0.5 MG tablet Take 1 tablet (0.5 mg total) by mouth 2 (two) times daily. Patient taking differently: Take 0.5 mg by mouth at bedtime.  02/04/19  Yes Swayze, Ava, DO  Metoprolol Tartrate 75 MG TABS Take 75 mg by mouth 2 (two) times daily. 09/15/19  Yes Swayze, Ava, DO  Multiple Vitamins-Minerals (CERTAVITE SENIOR/ANTIOXIDANT PO) Take 1 tablet by mouth daily.    Yes [provider]  Polyethyl Glycol-Propyl Glycol (SYSTANE) 0.4-0.3 % SOLN Place 1 drop into both eyes every hour as needed (dry eyes/irritation).   Yes [provider]  potassium chloride SA (K-DUR,KLOR-CON) 20 MEQ tablet Take 20 mEq by mouth  every other day.   Yes [provider]  psyllium (REGULOID) 0.52 g capsule Take 0.52 g by mouth daily.   Yes [provider]  simvastatin (ZOCOR) 10 MG tablet Take 1 tablet (10 mg total) by mouth daily at 6 PM. 07/01/14  Yes Biby, Jani Files, NP  STIOLTO RESPIMAT 2.5-2.5 MCG/ACT AERS Inhale 2 puffs into the lungs daily. 04/17/19  Yes [provider]  ziprasidone (GEODON) 20 MG capsule TAKE ONE CAPSULE BY MOUTH DAILY IN THE AFTERNOON **DO NOT CRUSH** **NO REFILLS** Patient taking differently: Take 20 mg by mouth daily.  09/10/19  Yes Cottle, Steva Ready., MD  saline (AYR) GEL Place 1 application into both nostrils at bedtime. Patient not taking: Reported on 09/10/2019    [provider]    Physical Exam: Vitals:   10/16/19 1515 10/16/19 1545 10/16/19 1600 10/16/19 1615  BP: (!) 148/88 (!) 150/86 (!) 144/85 (!) 141/82  Pulse: 97 88 80 93  Resp: (!) 22 (!) 26 (!) 22 18  Temp:      TempSrc:      SpO2: 100% 100% 99% 99%  Weight:      Height:        Constitutional: NAD, calm, comfortable, on 2 L of oxygen via nasal cannula, not in acute distress, communicating well Eyes: PERRL, lids and conjunctivae normal ENMT: Mucous membranes are moist. Posterior pharynx clear of any exudate or lesions.Normal dentition.  Neck: normal, supple, no masses, no thyromegaly Respiratory: clear to auscultation bilaterally, no wheezing, no crackles. Normal respiratory effort. No accessory muscle use.  Cardiovascular: Irregular heart rhythm no murmurs / rubs / gallops. No extremity edema. 2+ pedal pulses. No carotid bruits.  Abdomen: no tenderness, no masses palpated. No hepatosplenomegaly. Bowel sounds positive.  Musculoskeletal: Brace noted in right hip Skin: Multiple bruises noted in bilateral lower extremities Neurologic: CN 2-12 grossly intact. Sensation intact, DTR normal. Strength 5/5 in all 4.  Psychiatric: Normal judgment and insight. Alert and oriented x 3. Normal mood.     Labs on Admission: I have personally reviewed following labs and imaging studies  CBC: Recent Labs  Lab 10/16/19 1200 10/16/19 1600  WBC 7.1  --   HGB 12.5 12.2  HCT 39.7 36.0  MCV 99.0  --   PLT 273  --    Basic Metabolic Panel: Recent Labs  Lab 10/16/19 1200 10/16/19 1600  NA 139 137  K 3.8 3.3*  CL 101  --   CO2 28  --   GLUCOSE 100*  --   BUN 18  --   CREATININE 1.06*  --   CALCIUM 9.0  --    GFR: Estimated Creatinine Clearance: 23.7 mL/min (A) (by C-G formula based on SCr of 1.06 mg/dL (H)). Liver Function Tests: No results for input(s): AST, ALT, ALKPHOS, BILITOT, PROT, ALBUMIN in the last 168 hours. No results for input(s): LIPASE, AMYLASE in the last 168 hours. No results for input(s): AMMONIA in the last 168 hours. Coagulation Profile: No results for input(s): INR, PROTIME in the last 168 hours. Cardiac Enzymes: No results for input(s): CKTOTAL, CKMB, CKMBINDEX, TROPONINI in the last 168 hours. BNP (last 3 results) No results for input(s): PROBNP in the last 8760 hours. HbA1C: No results for input(s): HGBA1C in the last 72 hours. CBG: No results for input(s): GLUCAP in the last 168 hours. Lipid Profile: No results for input(s): CHOL, HDL, LDLCALC, TRIG, CHOLHDL, LDLDIRECT in the last 72 hours. Thyroid Function Tests: No results for input(s): TSH, T4TOTAL, FREET4, T3FREE, THYROIDAB in the last 72 hours. Anemia Panel: No results for input(s): VITAMINB12, FOLATE, FERRITIN, TIBC, IRON, RETICCTPCT in the last 72 hours. Urine analysis:    Component Value Date/Time   COLORURINE YELLOW 09/10/2019 1737   APPEARANCEUR CLOUDY (A) 09/10/2019 1737   LABSPEC 1.014 09/10/2019 1737   PHURINE 7.0 09/10/2019 1737   GLUCOSEU NEGATIVE 09/10/2019 1737   HGBUR MODERATE (A) 09/10/2019 1737   BILIRUBINUR NEGATIVE 09/10/2019 1737   KETONESUR NEGATIVE 09/10/2019 1737   PROTEINUR NEGATIVE 09/10/2019 1737   UROBILINOGEN 0.2 06/29/2014 0943   NITRITE NEGATIVE  09/10/2019 1737   LEUKOCYTESUR SMALL (A) 09/10/2019 1737    Radiological Exams on Admission: DG Chest 2 View  Result Date: 10/16/2019 CLINICAL DATA:  86 year old female with shortness of breath. Atrial fibrillation EXAM: CHEST - 2 VIEW COMPARISON:  Portable chest 09/13/2019 and earlier. FINDINGS: Stable cardiomegaly and mediastinal contours. Visualized tracheal air column is within normal  limits. Large lung volumes. No pneumothorax or pulmonary edema. Small bilateral pleural effusions persist from the CTA last month. No other abnormal pulmonary opacity. Osteopenia. Stable visualized osseous structures. Abdominal Calcified aortic atherosclerosis. Negative visible bowel gas pattern. IMPRESSION: 1. Small bilateral pleural effusions persist from the CTA last month. 2. No other acute cardiopulmonary abnormality. Electronically Signed   By: Odessa Fleming M.D.   On: 10/16/2019 12:37   CT Angio Chest PE W and/or Wo Contrast  Result Date: 10/16/2019 CLINICAL DATA:  Positive D-dimer, shortness of breath, atrial fibrillation EXAM: CT ANGIOGRAPHY CHEST WITH CONTRAST TECHNIQUE: Multidetector CT imaging of the chest was performed using the standard protocol during bolus administration of intravenous contrast. Multiplanar CT image reconstructions and MIPs were obtained to evaluate the vascular anatomy. CONTRAST:  48mL OMNIPAQUE IOHEXOL 350 MG/ML SOLN COMPARISON:  10/16/2019, 09/10/2019 FINDINGS: Cardiovascular: This is a technically adequate evaluation of the pulmonary vasculature. There are no filling defects or pulmonary emboli. There is biatrial dilatation. No pericardial effusion. Normal caliber of the thoracic aorta. Mild atherosclerosis throughout the thoracic aorta. Mediastinum/Nodes: No enlarged mediastinal, hilar, or axillary lymph nodes. Thyroid gland, trachea, and esophagus demonstrate no significant findings. Lungs/Pleura: There are small bilateral pleural effusions, with minimal dependent atelectasis. No airspace  disease or pneumothorax. Central airways are patent. Upper Abdomen: No acute abnormality. Musculoskeletal: No acute or destructive bony lesions. Reconstructed images demonstrate no additional findings. Review of the MIP images confirms the above findings. IMPRESSION: 1. No evidence of pulmonary embolus. 2. Small bilateral pleural effusions and dependent atelectasis. 3.  Aortic Atherosclerosis (ICD10-I70.0). Electronically Signed   By: Sharlet Salina M.D.   On: 10/16/2019 15:13    EKG: Independently reviewed. A. fib with right axis deviation, no ST elevation or depression noted.  Assessment/Plan Principal Problem:   Acute hypoxemic respiratory failure (HCC) Active Problems:   Depression   Essential hypertension   Dementia (HCC)   Closed fracture of femur with nonunion   Hyperlipidemia LDL goal <70   GAD (generalized anxiety disorder)   Acute on chronic congestive heart failure (HCC)   PAF (paroxysmal atrial fibrillation) (HCC)    Acute hypoxemic respiratory failure: In the setting of acute on chronic CHF. Patient tachypneic-in 20s, requiring 2 L of oxygen via nasal cannula.  -She is afebrile with no leukocytosis. COVID-19 negative.  -D-dimer: 1.59, CT angio is negative for PE. Reviewed chest x-ray and CT angio of chest. BNP: 415 (below baseline), troponin negative. -Admit patient on the floor. On continuous pulse ox. We'll try to wean off of oxygen as tolerated. -Reviewed echo from 09/11/2019 which showed ejection fraction of 45 to 50%, moderate TR, mild aortic valve sclerosis with no evidence of AVS. -She takes Lasix 20 mg every other day. Start patient on Lasix 40 IV daily.  -Strict INO's and daily weight. Monitor electrolytes closely. Monitor kidney function closely.  Hypertension: Stable -Continue amlodipine, metoprolol -Monitor blood pressure closely  Paroxysmal A. fib: Rate controlled -Reviewed EKG. On telemetry -CHA2DS2-VASc score is 8. Continue amiodarone, metoprolol and  Eliquis  Hypokalemia: Potassium 3.3. Replenished. Check magnesium level. Repeat BMP tomorrow a.m.  Depression/anxiety: Continue home meds: Celexa, Ativan  Dementia: Continue galantamine  Hyperlipidemia: Continue statin  CKD 3A: Stable -Continue to monitor.  History of right hip periprosthetic fracture: Chronic -Patient has brace. -Not candidate for surgery. -Norco as needed for pain control  DVT prophylaxis: Eliquis/SCD Code Status: DNR-confirmed with patient's daughter at bedside Family Communication: Patient's daughter present at bedside.  Plan of care discussed with patient in  length and she verbalized understanding and agreed with it. Disposition Plan: Likely SNF tomorrow a.m. Consults called: None Admission status: inpatient.   Ollen Bowl MD [facility]  If 7PM-7AM, please contact night-coverage [email or web address] Password [facility]  10/16/2019, 4:58 PM

## 2019-10-16 NOTE — ED Triage Notes (Signed)
Arrived via EMS from [facility]. During rehab for her hip patient developed shortness of breath. EMS reported Pulse oximetry 88% room air administered 2L [place] increased to 94%. En route EMS reported patient shortness of breath resolved and has no complaints. Alert answering and following commands appropriate.

## 2019-10-16 NOTE — ED Notes (Signed)
This nurse attempted to give report to [facility]

## 2019-10-16 NOTE — Telephone Encounter (Signed)
Spoke with daughter and advised.

## 2019-10-16 NOTE — ED Notes (Signed)
hospitalist at bedside

## 2019-10-16 NOTE — ED Provider Notes (Signed)
[facility] EMERGENCY DEPARTMENT Provider Note   CSN: [id number] Arrival date & time: 10/16/19  1147     History Chief Complaint  Patient presents with  . Shortness of Breath    Yolanda Baker is a 87 y.o. female history of CHF, COPD, atrial fibrillation, CVA, dementia, on Eliquis.  Patient presents today for shortness of breath that began just prior to arrival.  She is currently staying at [facility].  She reports that she was in her normal state of health when she began feeling short of breath, she denies any associated pain reports a difficulty catching her breath.  EMS was called and noted patient to be hypoxic with SPO2 88% on room air this improved on 2 L nasal cannula, additionally they noted patient to be in atrial fibrillation.  She reports that since being started on oxygen her shortness of breath has significantly improved.  She denies fever/chills, chest pain, cough/hemoptysis, extremity swelling/color change, abdominal pain, nausea/vomiting, diarrhea or any additional concerns.  HPI     Past Medical History:  Diagnosis Date  . Anemia   . Anxiety   . Arthritis    neck  . Bruises easily   . Chronic diastolic CHF (congestive heart failure) (HCC)   . COPD (chronic obstructive pulmonary disease) (HCC)   . Dementia (HCC)   . Depression   . Dysrhythmia    hx A-FLUTTER after hip surg April 2015- take metoprolol  . Eczema   . Falls   . Hip fracture (HCC)   . Hip joint pain    RT  . Hypertension   . Osteoporosis   . Persistent atrial fibrillation (HCC) 08/04/2019  . Pneumonia    august 2017  . Stroke [facility])     Patient Active Problem List   Diagnosis Date Noted  . Persistent atrial fibrillation (HCC) 09/25/2019  . Secondary hypercoagulable state (HCC) 09/25/2019  . Atrial fibrillation with RVR (HCC) 09/11/2019  . PAF (paroxysmal atrial fibrillation) (HCC) 08/04/2019  . Goals of care, counseling/discussion   . Palliative  care by specialist   . Pressure injury of skin 05/07/2019  . Periprosthetic fracture around internal prosthetic hip joint 05/06/2019  . DNR (do not resuscitate) present on admission 02/04/2019  . Acute on chronic diastolic heart failure (HCC)   . Acute on chronic congestive heart failure (HCC) 02/03/2019  . GAD (generalized anxiety disorder) 01/15/2018  . Schizoaffective disorder (HCC) 01/15/2018  . Hyperlipidemia LDL goal <70 07/01/2014  . Constipation chronic 07/01/2014  . Acute CVA (cerebrovascular accident) (HCC)   . Acute blood loss anemia 10/12/2013  . Postoperative anemia due to acute blood loss 10/04/2013  . Postop Transfusion 10/04/2013  . Closed fracture of femur with nonunion 10/03/2013  . Femur fracture, right (HCC) 10/03/2013  . Closed femur fracture (HCC) 10/03/2013  . UTI (urinary tract infection) 06/23/2013  . Dementia (HCC) 06/09/2013  . Anxiety 06/09/2013  . Atrial flutter (HCC) 06/07/2013  . Hip fracture (HCC) 06/04/2013  . Depression 06/04/2013  . Anxiety state 06/04/2013  . Essential hypertension 06/04/2013  . Closed right hip fracture (HCC) 06/04/2013    Past Surgical History:  Procedure Laterality Date  . ABDOMINAL HYSTERECTOMY    . FRACTURE SURGERY Right 06/05/2013   hip  . HIP ARTHROPLASTY Right 10/03/2013   Procedure: CONVERSION OF PREVIOUS RIGHT HIP SURGERY TO A RIGHT TOTAL HIP ARTHROPLASTY;  Surgeon: Loanne Drilling, MD;  Location: [facility] ORS;  Service: Orthopedics;  Laterality: Right;  . INTRAMEDULLARY (IM)  NAIL INTERTROCHANTERIC Right 06/05/2013   Procedure: INTRAMEDULLARY (IM) NAIL INTERTROCHANTRIC;  Surgeon: Loanne Drilling, MD;  Location: [facility] ORS;  Service: Orthopedics;  Laterality: Right;  . JOINT REPLACEMENT     left hip  . left hip Left 2008  . LEG SURGERY Right    x2     OB History   No obstetric history on file.     Family History  Problem Relation Age of Onset  . Heart attack Father   . Hypertension Father   . Hypertension Sister    . Stroke Sister   . Hypertension Brother     Social History   Tobacco Use  . Smoking status: Never Smoker  . Smokeless tobacco: Never Used  Vaping Use  . Vaping Use: Never used  Substance Use Topics  . Alcohol use: No    Alcohol/week: 0.0 standard drinks  . Drug use: No    Home Medications Prior to Admission medications   Medication Sig Start Date End Date Taking? Authorizing Provider  acetaminophen (TYLENOL) 500 MG tablet Take 1 tablet (500 mg total) by mouth 2 (two) times daily as needed for mild pain or moderate pain. 03/02/15   Leta Baptist, MD  amiodarone (PACERONE) 200 MG tablet Take 1 tablet (200 mg total) by mouth 2 (two) times daily. 09/15/19   Swayze, Ava, DO  amLODipine (NORVASC) 5 MG tablet Take 5 mg by mouth daily.    [provider]  apixaban (ELIQUIS) 2.5 MG TABS tablet Take 1 tablet (2.5 mg total) by mouth 2 (two) times daily. 07/01/14   Layne Benton, NP  chlorhexidine (PERIDEX) 0.12 % solution Use as directed 15 mLs in the mouth or throat 2 (two) times daily. Swish and spit    [provider]  Chlorpheniramine-APAP (CORICIDIN) 2-325 MG TABS Take 1 tablet by mouth 2 (two) times daily as needed (congestion).    [provider]  citalopram (CELEXA) 20 MG tablet Take 1 tablet (20 mg total) by mouth daily. 09/15/19   Swayze, Ava, DO  furosemide (LASIX) 20 MG tablet Take 1 tablet (20 mg total) by mouth every Monday, Wednesday, and Friday. 09/16/19   Swayze, Ava, DO  galantamine (RAZADYNE ER) 8 MG 24 hr capsule Take 8 mg by mouth daily with breakfast.    [provider]  HYDROcodone-acetaminophen (NORCO/VICODIN) 5-325 MG tablet Take 1-2 tablets by mouth every 6 (six) hours as needed for moderate pain. 05/10/19   Pokhrel, Rebekah Chesterfield, MD  loperamide (IMODIUM) 2 MG capsule Take 2 mg by mouth as needed for diarrhea or loose stools.  07/14/19   [provider]  LORazepam (ATIVAN) 0.5 MG tablet Take 1 tablet (0.5 mg total) by mouth 2 (two)  times daily. Patient taking differently: Take 0.5 mg by mouth daily.  02/04/19   Swayze, Ava, DO  Metoprolol Tartrate 75 MG TABS Take 75 mg by mouth 2 (two) times daily. 09/15/19   Swayze, Ava, DO  Multiple Vitamins-Minerals (CERTAVITE SENIOR/ANTIOXIDANT PO) Take 1 tablet by mouth daily.     [provider]  Polyethyl Glycol-Propyl Glycol (SYSTANE) 0.4-0.3 % SOLN Place 1 drop into both eyes every hour as needed (dry eyes/irritation).    [provider]  potassium chloride SA (K-DUR,KLOR-CON) 20 MEQ tablet Take 20 mEq by mouth every other day.    [provider]  psyllium (REGULOID) 0.52 g capsule Take 0.52 g by mouth daily.    [provider]  saline (AYR) GEL Place 1 application into both nostrils  at bedtime. Patient not taking: Reported on 09/10/2019    [provider]  simvastatin (ZOCOR) 10 MG tablet Take 1 tablet (10 mg total) by mouth daily at 6 PM. 07/01/14   Layne Benton, NP  STIOLTO RESPIMAT 2.5-2.5 MCG/ACT AERS Inhale 2 puffs into the lungs daily. 04/17/19   [provider]  ziprasidone (GEODON) 20 MG capsule TAKE ONE CAPSULE BY MOUTH DAILY IN THE AFTERNOON **DO NOT CRUSH** **NO REFILLS** Patient taking differently: Take 20 mg by mouth daily.  09/10/19   Cottle, Steva Ready., MD    Allergies    Flagyl [metronidazole]  Review of Systems   Review of Systems Ten systems are reviewed and are negative for acute change except as noted in the HPI  Physical Exam Updated Vital Signs BP (!) 148/88   Pulse 97   Temp 99 F (37.2 C) (Oral)   Resp (!) 22   Ht 5\' 2"  (1.575 m)   Wt 46.3 kg   SpO2 100%   BMI 18.66 kg/m   Physical Exam Constitutional:      General: She is not in acute distress.    Appearance: Normal appearance. She is well-developed. She is not ill-appearing or diaphoretic.  HENT:     Head: Normocephalic and atraumatic.  Eyes:     General: Vision grossly intact. Gaze aligned appropriately.     Pupils: Pupils are equal,  round, and reactive to light.  Neck:     Trachea: Trachea and phonation normal.  Cardiovascular:     Rate and Rhythm: Normal rate. Rhythm irregular.  Pulmonary:     Effort: Pulmonary effort is normal. No respiratory distress.     Comments: Coarse lung sounds bilaterally Abdominal:     General: There is no distension.     Palpations: Abdomen is soft.     Tenderness: There is no abdominal tenderness. There is no guarding or rebound.  Musculoskeletal:        General: Normal range of motion.     Cervical back: Normal range of motion.     Right lower leg: No tenderness. No edema.     Left lower leg: No tenderness. No edema.     Comments: Immobilizer right leg.  Skin:    General: Skin is warm and dry.  Neurological:     Mental Status: She is alert.     GCS: GCS eye subscore is 4. GCS verbal subscore is 5. GCS motor subscore is 6.     Comments: Speech is clear and goal oriented, follows commands Major Cranial nerves without deficit, no facial droop Moves extremities without ataxia, coordination intact  Psychiatric:        Behavior: Behavior normal.     ED Results / Procedures / Treatments   Labs (all labs ordered are listed, but only abnormal results are displayed) Labs Reviewed  BASIC METABOLIC PANEL - Abnormal; Notable for the following components:      Result Value   Glucose, Bld 100 (*)    Creatinine, Ser 1.06 (*)    GFR calc non Af Amer 45 (*)    GFR calc Af Amer 52 (*)    All other components within normal limits  BRAIN NATRIURETIC PEPTIDE - Abnormal; Notable for the following components:   B Natriuretic Peptide 415.6 (*)    All other components within normal limits  D-DIMER, QUANTITATIVE (NOT AT [facility]) - Abnormal; Notable for the following components:   D-Dimer, Quant 1.59 (*)    All other components within  normal limits  SARS CORONAVIRUS 2 BY RT PCR (HOSPITAL ORDER, PERFORMED IN [facility])  CBC  I-STAT ARTERIAL BLOOD GAS, ED  TROPONIN I (HIGH  SENSITIVITY)  TROPONIN I (HIGH SENSITIVITY)    EKG EKG Interpretation  Date/Time:  Friday October 16 2019 11:51:35 EDT Ventricular Rate:  91 PR Interval:    QRS Duration: 68 QT Interval:  374 QTC Calculation: 460 R Axis:   125 Text Interpretation: Atrial fibrillation Right axis deviation Septal infarct , age undetermined Abnormal ECG No significant change since last tracing Confirmed by Jacalyn Lefevre [phone]) on 10/16/2019 1:08:45 PM   Radiology DG Chest 2 View  Result Date: 10/16/2019 CLINICAL DATA:  86 year old female with shortness of breath. Atrial fibrillation EXAM: CHEST - 2 VIEW COMPARISON:  Portable chest 09/13/2019 and earlier. FINDINGS: Stable cardiomegaly and mediastinal contours. Visualized tracheal air column is within normal limits. Large lung volumes. No pneumothorax or pulmonary edema. Small bilateral pleural effusions persist from the CTA last month. No other abnormal pulmonary opacity. Osteopenia. Stable visualized osseous structures. Abdominal Calcified aortic atherosclerosis. Negative visible bowel gas pattern. IMPRESSION: 1. Small bilateral pleural effusions persist from the CTA last month. 2. No other acute cardiopulmonary abnormality. Electronically Signed   By: Odessa Fleming M.D.   On: 10/16/2019 12:37   CT Angio Chest PE W and/or Wo Contrast  Result Date: 10/16/2019 CLINICAL DATA:  Positive D-dimer, shortness of breath, atrial fibrillation EXAM: CT ANGIOGRAPHY CHEST WITH CONTRAST TECHNIQUE: Multidetector CT imaging of the chest was performed using the standard protocol during bolus administration of intravenous contrast. Multiplanar CT image reconstructions and MIPs were obtained to evaluate the vascular anatomy. CONTRAST:  13mL OMNIPAQUE IOHEXOL 350 MG/ML SOLN COMPARISON:  10/16/2019, 09/10/2019 FINDINGS: Cardiovascular: This is a technically adequate evaluation of the pulmonary vasculature. There are no filling defects or pulmonary emboli. There is biatrial dilatation. No  pericardial effusion. Normal caliber of the thoracic aorta. Mild atherosclerosis throughout the thoracic aorta. Mediastinum/Nodes: No enlarged mediastinal, hilar, or axillary lymph nodes. Thyroid gland, trachea, and esophagus demonstrate no significant findings. Lungs/Pleura: There are small bilateral pleural effusions, with minimal dependent atelectasis. No airspace disease or pneumothorax. Central airways are patent. Upper Abdomen: No acute abnormality. Musculoskeletal: No acute or destructive bony lesions. Reconstructed images demonstrate no additional findings. Review of the MIP images confirms the above findings. IMPRESSION: 1. No evidence of pulmonary embolus. 2. Small bilateral pleural effusions and dependent atelectasis. 3.  Aortic Atherosclerosis (ICD10-I70.0). Electronically Signed   By: Sharlet Salina M.D.   On: 10/16/2019 15:13    Procedures .Critical Care Performed by: Bill Salinas, PA-C Authorized by: Bill Salinas, PA-C   Critical care provider statement:    Critical care time (minutes):  35   Critical care was necessary to treat or prevent imminent or life-threatening deterioration of the following conditions:  Respiratory failure   Critical care was time spent personally by me on the following activities:  Discussions with consultants, evaluation of patient's response to treatment, examination of patient, ordering and performing treatments and interventions, ordering and review of laboratory studies, ordering and review of radiographic studies, pulse oximetry, re-evaluation of patient's condition, obtaining history from patient or surrogate, review of old charts and development of treatment plan with patient or surrogate   (including critical care time)  Medications Ordered in ED Medications  iohexol (OMNIPAQUE) 350 MG/ML injection 60 mL (60 mLs Intravenous Contrast Given 10/16/19 1508)    ED Course  I have reviewed the triage vital signs and the  nursing  notes.  Pertinent labs & imaging results that were available during my care of the patient were reviewed by me and considered in my medical decision making (see chart for details).    MDM Rules/Calculators/A&P                         Additional history obtained from: 1. Nursing notes from this visit. 2. Family at bedside. 3. Electronic medical record reviewed. ------------------------------------------- I ordered, reviewed and interpreted labs which include: Covid test negative. Initial troponin within normal limits. D-dimer elevated at 1.59, CT angio PE study ordered. BNP elevated at 415.6, lower than prior 1 month ago near 600. CBC within normal limits, no anemia or leukocytosis. BMP shows mild elevation of creatinine, no emergent lecture derangement or gap.  EKG: Atrial fibrillation Right axis deviation Septal infarct , age undetermined Abnormal ECG No significant change since last tracing Confirmed by Jacalyn Lefevre [phone]) on 10/16/2019 1:08:45 PM  CXR:  IMPRESSION:  1. Small bilateral pleural effusions persist from the CTA last  month.  2. No other acute cardiopulmonary abnormality.   CT Angio PE Study:  IMPRESSION:  1. No evidence of pulmonary embolus.  2. Small bilateral pleural effusions and dependent atelectasis.  3. Aortic Atherosclerosis (ICD10-I70.0).  - Possible patient's hypoxia today secondary to CHF exacerbation.  Patient will need admission for hypoxia, SPO2 upper 90s on 2 L nasal cannula.  On reevaluation she is resting comfortably no acute distress, patient and family are agreeable for admission.  Discussed case with Dr. Jacqulyn Bath at 3:50 PM, patient accepted to hospitalist service.  Patient seen and evaluated by Dr. Particia Nearing during this visit who agrees with work-up and admission.  Note: Portions of this report may have been transcribed using voice recognition software. Every effort was made to ensure accuracy; however, inadvertent computerized transcription  errors may still be present. Final Clinical Impression(s) / ED Diagnoses Final diagnoses:  Hypoxia  Shortness of breath  Acute respiratory failure with hypoxia [facility])    Rx / DC Orders ED Discharge Orders    None       Elizabeth Palau 10/16/19 1553    Jacalyn Lefevre, MD 10/16/19 1605

## 2019-10-16 NOTE — ED Notes (Signed)
Pt transported to CT ?

## 2019-10-17 DIAGNOSIS — I480 Paroxysmal atrial fibrillation: Secondary | ICD-10-CM

## 2019-10-17 DIAGNOSIS — E785 Hyperlipidemia, unspecified: Secondary | ICD-10-CM

## 2019-10-17 DIAGNOSIS — I5023 Acute on chronic systolic (congestive) heart failure: Secondary | ICD-10-CM

## 2019-10-17 DIAGNOSIS — I10 Essential (primary) hypertension: Secondary | ICD-10-CM

## 2019-10-17 DIAGNOSIS — F0150 Vascular dementia without behavioral disturbance: Secondary | ICD-10-CM

## 2019-10-17 DIAGNOSIS — S7291XK Unspecified fracture of right femur, subsequent encounter for closed fracture with nonunion: Secondary | ICD-10-CM

## 2019-10-17 LAB — COMPREHENSIVE METABOLIC PANEL
ALT: 14 U/L (ref 0–44)
AST: 26 U/L (ref 15–41)
Albumin: 3.2 g/dL — ABNORMAL LOW (ref 3.5–5.0)
Alkaline Phosphatase: 55 U/L (ref 38–126)
Anion gap: 11 (ref 5–15)
BUN: 17 mg/dL (ref 8–23)
CO2: 26 mmol/L (ref 22–32)
Calcium: 8.6 mg/dL — ABNORMAL LOW (ref 8.9–10.3)
Chloride: 101 mmol/L (ref 98–111)
Creatinine, Ser: 0.94 mg/dL (ref 0.44–1.00)
GFR calc Af Amer: >60 mL/min (ref >60)
GFR calc non Af Amer: 52 mL/min — ABNORMAL LOW (ref >60)
Glucose, Bld: 96 mg/dL (ref 70–99)
Potassium: 3.8 mmol/L (ref 3.5–5.1)
Sodium: 138 mmol/L (ref 135–145)
Total Bilirubin: 0.7 mg/dL (ref 0.3–1.2)
Total Protein: 6.8 g/dL (ref 6.5–8.1)

## 2019-10-17 LAB — CBC
HCT: 37.9 % (ref 36.0–46.0)
Hemoglobin: 12.1 g/dL (ref 12.0–15.0)
MCH: 30.7 pg (ref 26.0–34.0)
MCHC: 31.9 g/dL (ref 30.0–36.0)
MCV: 96.2 fL (ref 80.0–100.0)
Platelets: 244 10*3/uL (ref 150–400)
RBC: 3.94 MIL/uL (ref 3.87–5.11)
RDW: 15.2 % (ref 11.5–15.5)
WBC: 7 10*3/uL (ref 4.0–10.5)
nRBC: 0 % (ref 0.0–0.2)

## 2019-10-17 NOTE — Progress Notes (Signed)
PROGRESS NOTE    Yolanda Baker  RJJ:[id number] DOB: February 10, 1924 DOA: 10/16/2019 PCP: Rodrigo Ran, MD   Brief Narrative:  HPI on 10/16/2019 by Dr. Hosie Poisson Yolanda Baker is a 86 y.o. female with medical history significant of paroxysmal A. fib-on Eliquis, hypertension, hyperlipidemia, depression/anxiety, dementia, systolic CHF with ejection fraction of 45 to 50%, arthritis, CVA, osteoporosis presents to emergency department with shortness of breath and hypoxia.  Patient's daughter at bedside is the historian. She tells me that patient was doing fine this morning however she suddenly developed shortness of breath after PT/OT and was noted to be hypoxic with oxygen saturation of 88% on room air and improved from 2 L of oxygen via nasal cannula. Patient brought to emergency department for further evaluation and management.  Patient takes Lasix 20 mg every other day, reports chronic dry cough, chronic orthopnea however denies leg swelling, PND, chest pain, wheezing, fever, chills, nausea, vomiting, abdominal pain, headache, blurry vision, lightheadedness, dizziness, urinary or bowel changes.   She has history of hip fracture which managed conservatively with brace. She is not a candidate for surgery.  No history of smoking, alcohol, street drug use. She is wheelchair-bound. Assessment & Plan   Acute hypoxemic respiratory failure/acute on chronic systolic CHF exacerbation -Presented with tachypnea in the 20s as well as hypoxia noted to be in the 70s to mid 80s on room air -Patient was placed on 2 L of nasal cannula -BNP 415.6 although 1 month prior, it was near 600 -Chest x-ray; bilateral pleural effusions persistent from CTA done 1 month prior -Attempt to wean oxygen if possible -Echocardiogram 09/11/2019 showed an EF of 45 to 50%, LV global hypokinesis.  Mild left LVH.  LV diastolic parameters indeterminate.  Moderate TR, mild aortic valve sclerosis.   -Patient normally takes Lasix 20 mg  every other day -Currently on IV Lasix 20 mg daily -Monitor intake and output, daily weights  Elevated D-dimer -D-dimer elevated at 1.59 -CTA unremarkable for PE  Essential hypertension -Continue amlodipine, metoprolol and Lasix  Paroxysmal atrial fibrillation -CHA2DS2-VASc 8 -Continue Eliquis, amiodarone, metoprolol  Hypokalemia -Resolved with supplementation, continue to monitor BMP  Depression/anxiety -Stable, continue Celexa, Ativan  Dementia -Continue galantamine  Hyperlipidemia -Continue statin  Chronic kidney disease, stage IIIa -Creatinine appears stable, continue to monitor BMP closely given diuresis  History of right hip periprosthetic fracture -Chronic -Patient uses a brace when she is out of bed -She tells me that she does not walk she uses a wheelchair -Not a candidate for surgery -Continue pain control  DVT Prophylaxis  Eliquis  Code Status: DNR  Family Communication: None at beside  Disposition Plan:  Status is: Inpatient  Remains inpatient appropriate because:Inpatient level of care appropriate due to severity of illness   Dispo: The patient is from: ALF              Anticipated d/c is to: ALF              Anticipated d/c date is: 2 days              Patient currently is not medically stable to d/c.  Consultants None  Procedures  None  Antibiotics   Anti-infectives (From admission, onward)   None      Subjective:   Yolanda Baker seen and examined today.  Continues to have some shortness of breath and feels that her breathing is not back to baseline.  States she does not feel well.  Denies chest pain, abdominal  pain, nausea or vomiting, diarrhea or constipation, dizziness or headache.  Objective:   Vitals:   10/16/19 2019 10/17/19 0006 10/17/19 0255 10/17/19 0833  BP: 132/75 114/77 137/85 114/79  Pulse: 80 88 80 82  Resp: 20 18 18 18   Temp: 99.3 F (37.4 C) 97.9 F (36.6 C) 98 F (36.7 C) 97.6 F (36.4 C)  TempSrc: Oral  Oral Oral Oral  SpO2: 100% 100% 95% 100%  Weight:   46.5 kg   Height:        Intake/Output Summary (Last 24 hours) at 10/17/2019 1047 Last data filed at 10/17/2019 0900 Gross per 24 hour  Intake 480 ml  Output 250 ml  Net 230 ml   Filed Weights   10/16/19 1153 10/17/19 0255  Weight: 46.3 kg 46.5 kg    Exam  General: Well developed, chronically ill-appearing, elderly, frail, NAD  HEENT: NCAT, , mucous membranes moist.   Cardiovascular: S1 S2 auscultated, irregularly irregular  Respiratory: Diminished breath sounds  Abdomen: Soft, nontender, nondistended, + bowel sounds  Extremities: warm dry without cyanosis clubbing or edema  Neuro: AAOx3, nonfocal   Psych: appropriate mood and affect, pleasant   Data Reviewed: I have personally reviewed following labs and imaging studies  CBC: Recent Labs  Lab 10/16/19 1200 10/16/19 1600 10/17/19 0718  WBC 7.1  --  7.0  HGB 12.5 12.2 12.1  HCT 39.7 36.0 37.9  MCV 99.0  --  96.2  PLT 273  --  244   Basic Metabolic Panel: Recent Labs  Lab 10/16/19 1200 10/16/19 1600 10/16/19 1800 10/17/19 0718  NA 139 137  --  138  K 3.8 3.3*  --  3.8  CL 101  --   --  101  CO2 28  --   --  26  GLUCOSE 100*  --   --  96  BUN 18  --   --  17  CREATININE 1.06*  --   --  0.94  CALCIUM 9.0  --   --  8.6*  MG  --   --  1.7  --   PHOS  --   --  3.6  --    GFR: Estimated Creatinine Clearance: 26.9 mL/min (by C-G formula based on SCr of 0.94 mg/dL). Liver Function Tests: Recent Labs  Lab 10/17/19 0718  AST 26  ALT 14  ALKPHOS 55  BILITOT 0.7  PROT 6.8  ALBUMIN 3.2*   No results for input(s): LIPASE, AMYLASE in the last 168 hours. No results for input(s): AMMONIA in the last 168 hours. Coagulation Profile: No results for input(s): INR, PROTIME in the last 168 hours. Cardiac Enzymes: No results for input(s): CKTOTAL, CKMB, CKMBINDEX, TROPONINI in the last 168 hours. BNP (last 3 results) No results for input(s): PROBNP in the  last 8760 hours. HbA1C: No results for input(s): HGBA1C in the last 72 hours. CBG: No results for input(s): GLUCAP in the last 168 hours. Lipid Profile: No results for input(s): CHOL, HDL, LDLCALC, TRIG, CHOLHDL, LDLDIRECT in the last 72 hours. Thyroid Function Tests: No results for input(s): TSH, T4TOTAL, FREET4, T3FREE, THYROIDAB in the last 72 hours. Anemia Panel: No results for input(s): VITAMINB12, FOLATE, FERRITIN, TIBC, IRON, RETICCTPCT in the last 72 hours. Urine analysis:    Component Value Date/Time   COLORURINE YELLOW 10/16/2019 1754   APPEARANCEUR CLEAR 10/16/2019 1754   LABSPEC 1.019 10/16/2019 1754   PHURINE 7.0 10/16/2019 1754   GLUCOSEU NEGATIVE 10/16/2019 1754   HGBUR NEGATIVE 10/16/2019 1754  BILIRUBINUR NEGATIVE 10/16/2019 1754   KETONESUR NEGATIVE 10/16/2019 1754   PROTEINUR NEGATIVE 10/16/2019 1754   UROBILINOGEN 0.2 06/29/2014 0943   NITRITE NEGATIVE 10/16/2019 1754   LEUKOCYTESUR NEGATIVE 10/16/2019 1754   Sepsis Labs: @LABRCNTIP (procalcitonin:4,lacticidven:4)  ) Recent Results (from the past 240 hour(s))  SARS Coronavirus 2 by RT PCR (hospital order, performed in [facility] hospital lab) Nasopharyngeal Nasopharyngeal Swab     Status: None   Collection Time: 10/16/19  1:20 PM   Specimen: Nasopharyngeal Swab  Result Value Ref Range Status   SARS Coronavirus 2 NEGATIVE NEGATIVE Final    Comment: (NOTE) SARS-CoV-2 target nucleic acids are NOT DETECTED.  The SARS-CoV-2 RNA is generally detectable in upper and lower respiratory specimens during the acute phase of infection. The lowest concentration of SARS-CoV-2 viral copies this assay can detect is 250 copies / mL. A negative result does not preclude SARS-CoV-2 infection and should not be used as the sole basis for treatment or other patient management decisions.  A negative result may occur with improper specimen collection / handling, submission of specimen other than nasopharyngeal swab, presence  of viral mutation(s) within the areas targeted by this assay, and inadequate number of viral copies (<250 copies / mL). A negative result must be combined with clinical observations, patient history, and epidemiological information.  Fact Sheet for Patients:   10/18/19  Fact Sheet for Healthcare Providers: BoilerBrush.com.cy  This test is not yet approved or  cleared by the https://pope.com/ FDA and has been authorized for detection and/or diagnosis of SARS-CoV-2 by FDA under an Emergency Use Authorization (EUA).  This EUA will remain in effect (meaning this test can be used) for the duration of the COVID-19 declaration under Section 564(b)(1) of the Act, 21 U.S.C. section 360bbb-3(b)(1), unless the authorization is terminated or revoked sooner.  Performed at [facility], [address] [place], [place]       Radiology Studies: DG Chest 2 View  Result Date: 10/16/2019 CLINICAL DATA:  86 year old female with shortness of breath. Atrial fibrillation EXAM: CHEST - 2 VIEW COMPARISON:  Portable chest 09/13/2019 and earlier. FINDINGS: Stable cardiomegaly and mediastinal contours. Visualized tracheal air column is within normal limits. Large lung volumes. No pneumothorax or pulmonary edema. Small bilateral pleural effusions persist from the CTA last month. No other abnormal pulmonary opacity. Osteopenia. Stable visualized osseous structures. Abdominal Calcified aortic atherosclerosis. Negative visible bowel gas pattern. IMPRESSION: 1. Small bilateral pleural effusions persist from the CTA last month. 2. No other acute cardiopulmonary abnormality. Electronically Signed   By: 09/15/2019 M.D.   On: 10/16/2019 12:37   CT Angio Chest PE W and/or Wo Contrast  Result Date: 10/16/2019 CLINICAL DATA:  Positive D-dimer, shortness of breath, atrial fibrillation EXAM: CT ANGIOGRAPHY CHEST WITH CONTRAST TECHNIQUE: Multidetector CT imaging  of the chest was performed using the standard protocol during bolus administration of intravenous contrast. Multiplanar CT image reconstructions and MIPs were obtained to evaluate the vascular anatomy. CONTRAST:  61mL OMNIPAQUE IOHEXOL 350 MG/ML SOLN COMPARISON:  10/16/2019, 09/10/2019 FINDINGS: Cardiovascular: This is a technically adequate evaluation of the pulmonary vasculature. There are no filling defects or pulmonary emboli. There is biatrial dilatation. No pericardial effusion. Normal caliber of the thoracic aorta. Mild atherosclerosis throughout the thoracic aorta. Mediastinum/Nodes: No enlarged mediastinal, hilar, or axillary lymph nodes. Thyroid gland, trachea, and esophagus demonstrate no significant findings. Lungs/Pleura: There are small bilateral pleural effusions, with minimal dependent atelectasis. No airspace disease or pneumothorax. Central airways are patent. Upper  Abdomen: No acute abnormality. Musculoskeletal: No acute or destructive bony lesions. Reconstructed images demonstrate no additional findings. Review of the MIP images confirms the above findings. IMPRESSION: 1. No evidence of pulmonary embolus. 2. Small bilateral pleural effusions and dependent atelectasis. 3.  Aortic Atherosclerosis (ICD10-I70.0). Electronically Signed   By: Sharlet Salina M.D.   On: 10/16/2019 15:13     Scheduled Meds: . amiodarone  200 mg Oral Daily  . amLODipine  5 mg Oral Daily  . apixaban  2.5 mg Oral BID  . citalopram  20 mg Oral Daily  . furosemide  40 mg Intravenous Once  . furosemide  40 mg Intravenous Daily  . galantamine  8 mg Oral Q breakfast  . LORazepam  0.5 mg Oral QHS  . metoprolol tartrate  75 mg Oral BID  . simvastatin  10 mg Oral q1800  . ziprasidone  20 mg Oral Daily   Continuous Infusions:   LOS: 1 day   Time Spent in minutes   45 minutes  Larya Charpentier D.O. on 10/17/2019 at 10:47 AM  Between 7am to 7pm - Please see pager noted on [email or web address]  After 7pm go to  [email or web address]  And look for the night coverage person covering for me after hours  [facility] Office  [phone]

## 2019-10-17 NOTE — Plan of Care (Signed)

## 2019-10-17 NOTE — Plan of Care (Signed)
  Problem: Cardiac: Goal: Ability to achieve and maintain adequate cardiopulmonary perfusion will improve Outcome: Progressing   

## 2019-10-18 LAB — BASIC METABOLIC PANEL
Anion gap: 9 (ref 5–15)
BUN: 20 mg/dL (ref 8–23)
CO2: 29 mmol/L (ref 22–32)
Calcium: 8.5 mg/dL — ABNORMAL LOW (ref 8.9–10.3)
Chloride: 98 mmol/L (ref 98–111)
Creatinine, Ser: 1 mg/dL (ref 0.44–1.00)
GFR calc Af Amer: 56 mL/min — ABNORMAL LOW (ref >60)
GFR calc non Af Amer: 48 mL/min — ABNORMAL LOW (ref >60)
Glucose, Bld: 91 mg/dL (ref 70–99)
Potassium: 3.4 mmol/L — ABNORMAL LOW (ref 3.5–5.1)
Sodium: 136 mmol/L (ref 135–145)

## 2019-10-18 MED ORDER — POTASSIUM CHLORIDE CRYS ER 20 MEQ PO TBCR
40.0000 meq | EXTENDED_RELEASE_TABLET | Freq: Once | ORAL | Status: AC
  Administered 2019-10-18: 40 meq via ORAL
  Filled 2019-10-18: qty 2

## 2019-10-18 NOTE — Progress Notes (Signed)
RN rounded on pt.  RN taped pt IV per pt request. Pt states she does not need anything else at this time. Pt's daughter at bedside.

## 2019-10-18 NOTE — Progress Notes (Signed)
PROGRESS NOTE    Yolanda Baker  OYD:[id number] DOB: 26-Nov-1923 DOA: 10/16/2019 PCP: Rodrigo Ran, MD   Brief Narrative:  HPI on 10/16/2019 by Dr. Hosie Poisson Yolanda Baker is a 86 y.o. female with medical history significant of paroxysmal A. fib-on Eliquis, hypertension, hyperlipidemia, depression/anxiety, dementia, systolic CHF with ejection fraction of 45 to 50%, arthritis, CVA, osteoporosis presents to emergency department with shortness of breath and hypoxia.  Patient's daughter at bedside is the historian. She tells me that patient was doing fine this morning however she suddenly developed shortness of breath after PT/OT and was noted to be hypoxic with oxygen saturation of 88% on room air and improved from 2 L of oxygen via nasal cannula. Patient brought to emergency department for further evaluation and management.  Patient takes Lasix 20 mg every other day, reports chronic dry cough, chronic orthopnea however denies leg swelling, PND, chest pain, wheezing, fever, chills, nausea, vomiting, abdominal pain, headache, blurry vision, lightheadedness, dizziness, urinary or bowel changes.   She has history of hip fracture which managed conservatively with brace. She is not a candidate for surgery.  No history of smoking, alcohol, street drug use. She is wheelchair-bound.  Interim history Patient admitted with acute hypoxic respiratory failure, acute systolic CHF exacerbation.  Currently on IV Lasix.  Currently on nasal cannula, will attempt to wean as possible. Assessment & Plan   Acute hypoxemic respiratory failure/acute on chronic systolic CHF exacerbation -Presented with tachypnea in the 20s as well as hypoxia noted to be in the 70s to mid 80s on room air -Patient was placed on 2 L of nasal cannula -BNP 415.6 although 1 month prior, it was near 600 -Chest x-ray; bilateral pleural effusions persistent from CTA done 1 month prior -Continue to attempt to wean oxygen  -Echocardiogram  09/11/2019 showed an EF of 45 to 50%, LV global hypokinesis.  Mild left LVH.  LV diastolic parameters indeterminate.  Moderate TR, mild aortic valve sclerosis.   -Patient normally takes Lasix 20 mg every other day -Currently on IV Lasix 40 mg daily -Monitor intake and output, daily weights  Elevated D-dimer -D-dimer elevated at 1.59 -CTA unremarkable for PE -patient currently on eliquis  Essential hypertension -Continue amlodipine, metoprolol and Lasix  Paroxysmal atrial fibrillation -CHA2DS2-VASc 8 -Continue Eliquis, amiodarone, metoprolol  Hypokalemia -Potassium 3.4, will supplement  -continue to monitor BMP  Depression/anxiety -Stable, continue Celexa, Ativan  Dementia -Continue galantamine  Hyperlipidemia -Continue statin  Chronic kidney disease, stage IIIa -Creatinine appears stable, continue to monitor BMP closely given diuresis  History of right hip periprosthetic fracture -Chronic -Patient uses a brace when she is out of bed -She tells me that she does not walk she uses a wheelchair -Not a candidate for surgery -Continue pain control  DVT Prophylaxis  Eliquis  Code Status: DNR  Family Communication: None at beside  Disposition Plan:  Status is: Inpatient  Remains inpatient appropriate because:Inpatient level of care appropriate due to severity of illness   Dispo: The patient is from: ALF              Anticipated d/c is to: ALF              Anticipated d/c date is: 2 days              Patient currently is not medically stable to d/c.  Consultants None  Procedures  None  Antibiotics   Anti-infectives (From admission, onward)   None      Subjective:  Yolanda Baker seen and examined today.  Continues to have some shortness of breath and needs oxygen.  Feels her breathing has not returned to baseline.    States that she does not ambulate but uses a wheelchair.  Denies current chest pain, abdominal pain, nausea or vomiting, diarrhea or  constipation, dizziness or headache.    Objective:   Vitals:   10/17/19 0833 10/17/19 1209 10/17/19 2146 10/18/19 0543  BP: 114/79 128/79 128/72 127/90  Pulse: 82 88 83 84  Resp: 18 18 18 18   Temp: 97.6 F (36.4 C) 98 F (36.7 C) 98.2 F (36.8 C) 98.1 F (36.7 C)  TempSrc: Oral  Oral Oral  SpO2: 100% 100% 100% 100%  Weight:    46.7 kg  Height:        Intake/Output Summary (Last 24 hours) at 10/18/2019 0956 Last data filed at 10/18/2019 0548 Gross per 24 hour  Intake 480 ml  Output 875 ml  Net -395 ml   Filed Weights   10/16/19 1153 10/17/19 0255 10/18/19 0543  Weight: 46.3 kg 46.5 kg 46.7 kg   Exam  General: Well developed, chronically ill appearing, elderly, frail, NAD  HEENT: NCAT, mucous membranes moist.   Cardiovascular: S1 S2 auscultated, irregularly irregular  Respiratory: Clear to auscultation bilaterally   Abdomen: Soft, nontender, nondistended, + bowel sounds  Extremities: warm dry without cyanosis clubbing or edema  Neuro: AAOx3, nonfocal  Psych: Appropriate mood and affect, pleasant    Data Reviewed: I have personally reviewed following labs and imaging studies  CBC: Recent Labs  Lab 10/16/19 1200 10/16/19 1600 10/17/19 0718  WBC 7.1  --  7.0  HGB 12.5 12.2 12.1  HCT 39.7 36.0 37.9  MCV 99.0  --  96.2  PLT 273  --  244   Basic Metabolic Panel: Recent Labs  Lab 10/16/19 1200 10/16/19 1600 10/16/19 1800 10/17/19 0718 10/18/19 0623  NA 139 137  --  138 136  K 3.8 3.3*  --  3.8 3.4*  CL 101  --   --  101 98  CO2 28  --   --  26 29  GLUCOSE 100*  --   --  96 91  BUN 18  --   --  17 20  CREATININE 1.06*  --   --  0.94 1.00  CALCIUM 9.0  --   --  8.6* 8.5*  MG  --   --  1.7  --   --   PHOS  --   --  3.6  --   --    GFR: Estimated Creatinine Clearance: 25.4 mL/min (by C-G formula based on SCr of 1 mg/dL). Liver Function Tests: Recent Labs  Lab 10/17/19 0718  AST 26  ALT 14  ALKPHOS 55  BILITOT 0.7  PROT 6.8  ALBUMIN 3.2*    No results for input(s): LIPASE, AMYLASE in the last 168 hours. No results for input(s): AMMONIA in the last 168 hours. Coagulation Profile: No results for input(s): INR, PROTIME in the last 168 hours. Cardiac Enzymes: No results for input(s): CKTOTAL, CKMB, CKMBINDEX, TROPONINI in the last 168 hours. BNP (last 3 results) No results for input(s): PROBNP in the last 8760 hours. HbA1C: No results for input(s): HGBA1C in the last 72 hours. CBG: No results for input(s): GLUCAP in the last 168 hours. Lipid Profile: No results for input(s): CHOL, HDL, LDLCALC, TRIG, CHOLHDL, LDLDIRECT in the last 72 hours. Thyroid Function Tests: No results for input(s): TSH, T4TOTAL, FREET4, T3FREE, THYROIDAB  in the last 72 hours. Anemia Panel: No results for input(s): VITAMINB12, FOLATE, FERRITIN, TIBC, IRON, RETICCTPCT in the last 72 hours. Urine analysis:    Component Value Date/Time   COLORURINE YELLOW 10/16/2019 1754   APPEARANCEUR CLEAR 10/16/2019 1754   LABSPEC 1.019 10/16/2019 1754   PHURINE 7.0 10/16/2019 1754   GLUCOSEU NEGATIVE 10/16/2019 1754   HGBUR NEGATIVE 10/16/2019 1754   BILIRUBINUR NEGATIVE 10/16/2019 1754   KETONESUR NEGATIVE 10/16/2019 1754   PROTEINUR NEGATIVE 10/16/2019 1754   UROBILINOGEN 0.2 06/29/2014 0943   NITRITE NEGATIVE 10/16/2019 1754   LEUKOCYTESUR NEGATIVE 10/16/2019 1754   Sepsis Labs: @LABRCNTIP (procalcitonin:4,lacticidven:4)  ) Recent Results (from the past 240 hour(s))  SARS Coronavirus 2 by RT PCR (hospital order, performed in [facility] hospital lab) Nasopharyngeal Nasopharyngeal Swab     Status: None   Collection Time: 10/16/19  1:20 PM   Specimen: Nasopharyngeal Swab  Result Value Ref Range Status   SARS Coronavirus 2 NEGATIVE NEGATIVE Final    Comment: (NOTE) SARS-CoV-2 target nucleic acids are NOT DETECTED.  The SARS-CoV-2 RNA is generally detectable in upper and lower respiratory specimens during the acute phase of infection. The  lowest concentration of SARS-CoV-2 viral copies this assay can detect is 250 copies / mL. A negative result does not preclude SARS-CoV-2 infection and should not be used as the sole basis for treatment or other patient management decisions.  A negative result may occur with improper specimen collection / handling, submission of specimen other than nasopharyngeal swab, presence of viral mutation(s) within the areas targeted by this assay, and inadequate number of viral copies (<250 copies / mL). A negative result must be combined with clinical observations, patient history, and epidemiological information.  Fact Sheet for Patients:   10/18/19  Fact Sheet for Healthcare Providers: BoilerBrush.com.cy  This test is not yet approved or  cleared by the https://pope.com/ FDA and has been authorized for detection and/or diagnosis of SARS-CoV-2 by FDA under an Emergency Use Authorization (EUA).  This EUA will remain in effect (meaning this test can be used) for the duration of the COVID-19 declaration under Section 564(b)(1) of the Act, 21 U.S.C. section 360bbb-3(b)(1), unless the authorization is terminated or revoked sooner.  Performed at [facility], [address], [place], [place]       Radiology Studies: DG Chest 2 View  Result Date: 10/16/2019 CLINICAL DATA:  86 year old female with shortness of breath. Atrial fibrillation EXAM: CHEST - 2 VIEW COMPARISON:  Portable chest 09/13/2019 and earlier. FINDINGS: Stable cardiomegaly and mediastinal contours. Visualized tracheal air column is within normal limits. Large lung volumes. No pneumothorax or pulmonary edema. Small bilateral pleural effusions persist from the CTA last month. No other abnormal pulmonary opacity. Osteopenia. Stable visualized osseous structures. Abdominal Calcified aortic atherosclerosis. Negative visible bowel gas pattern. IMPRESSION: 1. Small  bilateral pleural effusions persist from the CTA last month. 2. No other acute cardiopulmonary abnormality. Electronically Signed   By: 09/15/2019 M.D.   On: 10/16/2019 12:37   CT Angio Chest PE W and/or Wo Contrast  Result Date: 10/16/2019 CLINICAL DATA:  Positive D-dimer, shortness of breath, atrial fibrillation EXAM: CT ANGIOGRAPHY CHEST WITH CONTRAST TECHNIQUE: Multidetector CT imaging of the chest was performed using the standard protocol during bolus administration of intravenous contrast. Multiplanar CT image reconstructions and MIPs were obtained to evaluate the vascular anatomy. CONTRAST:  1mL OMNIPAQUE IOHEXOL 350 MG/ML SOLN COMPARISON:  10/16/2019, 09/10/2019 FINDINGS: Cardiovascular: This is a technically adequate evaluation of the  pulmonary vasculature. There are no filling defects or pulmonary emboli. There is biatrial dilatation. No pericardial effusion. Normal caliber of the thoracic aorta. Mild atherosclerosis throughout the thoracic aorta. Mediastinum/Nodes: No enlarged mediastinal, hilar, or axillary lymph nodes. Thyroid gland, trachea, and esophagus demonstrate no significant findings. Lungs/Pleura: There are small bilateral pleural effusions, with minimal dependent atelectasis. No airspace disease or pneumothorax. Central airways are patent. Upper Abdomen: No acute abnormality. Musculoskeletal: No acute or destructive bony lesions. Reconstructed images demonstrate no additional findings. Review of the MIP images confirms the above findings. IMPRESSION: 1. No evidence of pulmonary embolus. 2. Small bilateral pleural effusions and dependent atelectasis. 3.  Aortic Atherosclerosis (ICD10-I70.0). Electronically Signed   By: Sharlet Salina M.D.   On: 10/16/2019 15:13     Scheduled Meds: . amiodarone  200 mg Oral Daily  . amLODipine  5 mg Oral Daily  . apixaban  2.5 mg Oral BID  . citalopram  20 mg Oral Daily  . furosemide  40 mg Intravenous Once  . furosemide  40 mg Intravenous Daily  .  galantamine  8 mg Oral Q breakfast  . LORazepam  0.5 mg Oral QHS  . metoprolol tartrate  75 mg Oral BID  . simvastatin  10 mg Oral q1800  . ziprasidone  20 mg Oral Daily   Continuous Infusions:   LOS: 2 days   Time Spent in minutes   45 minutes  Eleaner Dibartolo D.O. on 10/18/2019 at 9:56 AM  Between 7am to 7pm - Please see pager noted on [email or web address]  After 7pm go to [email or web address]  And look for the night coverage person covering for me after hours  [facility] Office  [phone]

## 2019-10-18 NOTE — Progress Notes (Signed)
Pt removed all leads from body.  RN replaced leads and changed pt's gown. RN provided education regarding why leads shouldn't be removed. Pt's verbalizes understanding. Bed alarm is on.

## 2019-10-19 ENCOUNTER — Telehealth: Payer: Self-pay | Admitting: Cardiovascular Disease

## 2019-10-19 LAB — BASIC METABOLIC PANEL
Anion gap: 9 (ref 5–15)
BUN: 20 mg/dL (ref 8–23)
CO2: 30 mmol/L (ref 22–32)
Calcium: 8.5 mg/dL — ABNORMAL LOW (ref 8.9–10.3)
Chloride: 99 mmol/L (ref 98–111)
Creatinine, Ser: 0.99 mg/dL (ref 0.44–1.00)
GFR calc Af Amer: 57 mL/min — ABNORMAL LOW (ref >60)
GFR calc non Af Amer: 49 mL/min — ABNORMAL LOW (ref >60)
Glucose, Bld: 98 mg/dL (ref 70–99)
Potassium: 3.7 mmol/L (ref 3.5–5.1)
Sodium: 138 mmol/L (ref 135–145)

## 2019-10-19 MED ORDER — POTASSIUM CHLORIDE CRYS ER 20 MEQ PO TBCR
40.0000 meq | EXTENDED_RELEASE_TABLET | Freq: Once | ORAL | Status: AC
  Administered 2019-10-19: 40 meq via ORAL
  Filled 2019-10-19: qty 2

## 2019-10-19 MED ORDER — DIPHENHYDRAMINE HCL 25 MG PO CAPS
25.0000 mg | ORAL_CAPSULE | Freq: Once | ORAL | Status: AC
  Administered 2019-10-19: 25 mg via ORAL
  Filled 2019-10-19: qty 1

## 2019-10-19 MED ORDER — FUROSEMIDE 10 MG/ML IJ SOLN
20.0000 mg | Freq: Every day | INTRAMUSCULAR | Status: DC
  Administered 2019-10-20: 20 mg via INTRAVENOUS
  Filled 2019-10-19: qty 2

## 2019-10-19 NOTE — Telephone Encounter (Signed)
Patient's daughter, [name], called to request the patients OV be switched to VV due to being hospitalized. She states they should be discharging tomorrow 8/24. Appointment type switched to VV.

## 2019-10-19 NOTE — Progress Notes (Signed)
Patient called out into hallway asking for help but was unable to articulate what help was needed. Assessed patient to find her A&Ox4 but severely anxious. Patient stated this feeling came on gradually. Offered emotional support and reassurance.  Passed patient room approximately 20 minutes later to find patient still anxious and speaking to herself. Reported to MD. See new orders.

## 2019-10-19 NOTE — Progress Notes (Signed)
PROGRESS NOTE    Yolanda Baker  JOI:[id number] DOB: 1923/08/30 DOA: 10/16/2019 PCP: Yolanda Ran, MD   Brief Narrative:  HPI on 10/16/2019 by Dr. Hosie Poisson Baker is a 86 y.o. female with medical history significant of paroxysmal A. fib-on Eliquis, hypertension, hyperlipidemia, depression/anxiety, dementia, systolic CHF with ejection fraction of 45 to 50%, arthritis, CVA, osteoporosis presents to emergency department with shortness of breath and hypoxia.  Patient's daughter at bedside is the historian. She tells me that patient was doing fine this morning however she suddenly developed shortness of breath after PT/OT and was noted to be hypoxic with oxygen saturation of 88% on room air and improved from 2 L of oxygen via nasal cannula. Patient brought to emergency department for further evaluation and management.  Patient takes Lasix 20 mg every other day, reports chronic dry cough, chronic orthopnea however denies leg swelling, PND, chest pain, wheezing, fever, chills, nausea, vomiting, abdominal pain, headache, blurry vision, lightheadedness, dizziness, urinary or bowel changes.   She has history of hip fracture which managed conservatively with brace. She is not a candidate for surgery.  No history of smoking, alcohol, street drug use. She is wheelchair-bound.  Interim history Patient admitted with acute hypoxic respiratory failure, acute systolic CHF exacerbation.  Currently on IV Lasix.  Currently on nasal cannula, will attempt to wean as possible. Assessment & Plan   Acute hypoxemic respiratory failure/acute on chronic systolic CHF exacerbation -Presented with tachypnea in the 20s as well as hypoxia noted to be in the 70s to mid 80s on room air -Patient was placed on 2 L of nasal cannula and continues to be on 2L, have discussed with RN to see if we can wean  -BNP 415.6 although 1 month prior, it was near 600 -Chest x-ray; bilateral pleural effusions persistent from CTA  done 1 month prior -Continue to attempt to wean oxygen  -Echocardiogram 09/11/2019 showed an EF of 45 to 50%, LV global hypokinesis.  Mild left LVH.  LV diastolic parameters indeterminate.  Moderate TR, mild aortic valve sclerosis.   -Patient normally takes Lasix 20 mg every other day -was on IV Lasix 40 mg daily, will decrease to 20mg  -Monitor intake and output, daily weights  Elevated D-dimer -D-dimer elevated at 1.59 -CTA unremarkable for PE -patient currently on eliquis  Essential hypertension -Continue amlodipine, metoprolol and Lasix  -Noted to be hypotensive - will recheck vitals and place holding parameters on meds  Paroxysmal atrial fibrillation -CHA2DS2-VASc 8 -Continue Eliquis, amiodarone, metoprolol  Hypokalemia -Potassium 3.7 after supplementation. Will give small additional dose for goal of 4 -continue to monitor BMP  Depression/anxiety -Stable, continue Celexa, Ativan  Dementia -Continue galantamine  Hyperlipidemia -Continue statin  Chronic kidney disease, stage IIIa -Creatinine appears stable, continue to monitor BMP closely given diuresis  History of right hip periprosthetic fracture -Chronic -Patient uses a brace when she is out of bed -She tells me that she does not walk she uses a wheelchair -Not a candidate for surgery -Continue pain control  DVT Prophylaxis  Eliquis  Code Status: DNR  Family Communication: None at beside  Disposition Plan:  Status is: Inpatient  Remains inpatient appropriate because:Inpatient level of care appropriate due to severity of illness. Continues to be on oxygen.    Dispo: The patient is from: ALF              Anticipated d/c is to: ALF              Anticipated d/c  date is: 1 days              Patient currently is not medically stable to d/c.  Consultants None  Procedures  None  Antibiotics   Anti-infectives (From admission, onward)   None      Subjective:   Yolanda Baker seen and examined today.   Feels ok this morning. Feels some anxiety. Denies chest pain, abdominal pain, N/V/D/C. Feels breathing is better but not at baseline.    Objective:   Vitals:   10/19/19 0215 10/19/19 0510 10/19/19 0800 10/19/19 1130  BP:  117/73 125/88 (!) 83/52  Pulse:  90 (!) 102 (!) 59  Resp:  19 18 16   Temp:  98.9 F (37.2 C)  98 F (36.7 C)  TempSrc:  Oral  Oral  SpO2:  98% 100% 99%  Weight: 44.9 kg     Height:        Intake/Output Summary (Last 24 hours) at 10/19/2019 1219 Last data filed at 10/19/2019 0800 Gross per 24 hour  Intake 600 ml  Output 1225 ml  Net -625 ml   Filed Weights   10/17/19 0255 10/18/19 0543 10/19/19 0215  Weight: 46.5 kg 46.7 kg 44.9 kg   Exam  General: Well developed, chronically ill appearing, elderly, frail, NAD  HEENT: NCAT, mucous membranes moist.   Cardiovascular: S1 S2 auscultated, irregularly irregular  Respiratory: diminished breath sounds, but clear  Abdomen: Soft, nontender, nondistended, + bowel sounds  Extremities: warm dry without cyanosis clubbing or edema  Neuro: AAOx3, nonfocal  Psych: Appropriate mood and affect, pleasant    Data Reviewed: I have personally reviewed following labs and imaging studies  CBC: Recent Labs  Lab 10/16/19 1200 10/16/19 1600 10/17/19 0718  WBC 7.1  --  7.0  HGB 12.5 12.2 12.1  HCT 39.7 36.0 37.9  MCV 99.0  --  96.2  PLT 273  --  244   Basic Metabolic Panel: Recent Labs  Lab 10/16/19 1200 10/16/19 1600 10/16/19 1800 10/17/19 0718 10/18/19 0623 10/19/19 0733  NA 139 137  --  138 136 138  K 3.8 3.3*  --  3.8 3.4* 3.7  CL 101  --   --  101 98 99  CO2 28  --   --  26 29 30   GLUCOSE 100*  --   --  96 91 98  BUN 18  --   --  17 20 20   CREATININE 1.06*  --   --  0.94 1.00 0.99  CALCIUM 9.0  --   --  8.6* 8.5* 8.5*  MG  --   --  1.7  --   --   --   PHOS  --   --  3.6  --   --   --    GFR: Estimated Creatinine Clearance: 24.6 mL/min (by C-G formula based on SCr of 0.99 mg/dL). Liver  Function Tests: Recent Labs  Lab 10/17/19 0718  AST 26  ALT 14  ALKPHOS 55  BILITOT 0.7  PROT 6.8  ALBUMIN 3.2*   No results for input(s): LIPASE, AMYLASE in the last 168 hours. No results for input(s): AMMONIA in the last 168 hours. Coagulation Profile: No results for input(s): INR, PROTIME in the last 168 hours. Cardiac Enzymes: No results for input(s): CKTOTAL, CKMB, CKMBINDEX, TROPONINI in the last 168 hours. BNP (last 3 results) No results for input(s): PROBNP in the last 8760 hours. HbA1C: No results for input(s): HGBA1C in the last 72 hours. CBG: No results  for input(s): GLUCAP in the last 168 hours. Lipid Profile: No results for input(s): CHOL, HDL, LDLCALC, TRIG, CHOLHDL, LDLDIRECT in the last 72 hours. Thyroid Function Tests: No results for input(s): TSH, T4TOTAL, FREET4, T3FREE, THYROIDAB in the last 72 hours. Anemia Panel: No results for input(s): VITAMINB12, FOLATE, FERRITIN, TIBC, IRON, RETICCTPCT in the last 72 hours. Urine analysis:    Component Value Date/Time   COLORURINE YELLOW 10/16/2019 1754   APPEARANCEUR CLEAR 10/16/2019 1754   LABSPEC 1.019 10/16/2019 1754   PHURINE 7.0 10/16/2019 1754   GLUCOSEU NEGATIVE 10/16/2019 1754   HGBUR NEGATIVE 10/16/2019 1754   BILIRUBINUR NEGATIVE 10/16/2019 1754   KETONESUR NEGATIVE 10/16/2019 1754   PROTEINUR NEGATIVE 10/16/2019 1754   UROBILINOGEN 0.2 06/29/2014 0943   NITRITE NEGATIVE 10/16/2019 1754   LEUKOCYTESUR NEGATIVE 10/16/2019 1754   Sepsis Labs: @LABRCNTIP (procalcitonin:4,lacticidven:4)  ) Recent Results (from the past 240 hour(s))  SARS Coronavirus 2 by RT PCR (hospital order, performed in [facility] hospital lab) Nasopharyngeal Nasopharyngeal Swab     Status: None   Collection Time: 10/16/19  1:20 PM   Specimen: Nasopharyngeal Swab  Result Value Ref Range Status   SARS Coronavirus 2 NEGATIVE NEGATIVE Final    Comment: (NOTE) SARS-CoV-2 target nucleic acids are NOT DETECTED.  The SARS-CoV-2  RNA is generally detectable in upper and lower respiratory specimens during the acute phase of infection. The lowest concentration of SARS-CoV-2 viral copies this assay can detect is 250 copies / mL. A negative result does not preclude SARS-CoV-2 infection and should not be used as the sole basis for treatment or other patient management decisions.  A negative result may occur with improper specimen collection / handling, submission of specimen other than nasopharyngeal swab, presence of viral mutation(s) within the areas targeted by this assay, and inadequate number of viral copies (<250 copies / mL). A negative result must be combined with clinical observations, patient history, and epidemiological information.  Fact Sheet for Patients:   10/18/19  Fact Sheet for Healthcare Providers: BoilerBrush.com.cy  This test is not yet approved or  cleared by the https://pope.com/ FDA and has been authorized for detection and/or diagnosis of SARS-CoV-2 by FDA under an Emergency Use Authorization (EUA).  This EUA will remain in effect (meaning this test can be used) for the duration of the COVID-19 declaration under Section 564(b)(1) of the Act, 21 U.S.C. section 360bbb-3(b)(1), unless the authorization is terminated or revoked sooner.  Performed at [facility], [address], [place], [place]       Radiology Studies: No results found.   Scheduled Meds: . amiodarone  200 mg Oral Daily  . amLODipine  5 mg Oral Daily  . apixaban  2.5 mg Oral BID  . citalopram  20 mg Oral Daily  . [START ON 10/20/2019] furosemide  20 mg Intravenous Daily  . furosemide  40 mg Intravenous Once  . galantamine  8 mg Oral Q breakfast  . LORazepam  0.5 mg Oral QHS  . metoprolol tartrate  75 mg Oral BID  . simvastatin  10 mg Oral q1800  . ziprasidone  20 mg Oral Daily   Continuous Infusions:   LOS: 3 days   Time Spent in minutes    30 minutes  Pasqual Farias D.O. on 10/19/2019 at 12:19 PM  Between 7am to 7pm - Please see pager noted on [email or web address]  After 7pm go to [email or web address]  And look for the night coverage person covering for me after hours  [facility] Office  [phone]

## 2019-10-19 NOTE — Telephone Encounter (Signed)
Patient's daughter called to switch her in office appt to virtual appt.

## 2019-10-20 DIAGNOSIS — R0902 Hypoxemia: Secondary | ICD-10-CM

## 2019-10-20 LAB — BASIC METABOLIC PANEL
Anion gap: 10 (ref 5–15)
BUN: 21 mg/dL (ref 8–23)
CO2: 26 mmol/L (ref 22–32)
Calcium: 8.6 mg/dL — ABNORMAL LOW (ref 8.9–10.3)
Chloride: 99 mmol/L (ref 98–111)
Creatinine, Ser: 1.02 mg/dL — ABNORMAL HIGH (ref 0.44–1.00)
GFR calc Af Amer: 55 mL/min — ABNORMAL LOW (ref >60)
GFR calc non Af Amer: 47 mL/min — ABNORMAL LOW (ref >60)
Glucose, Bld: 96 mg/dL (ref 70–99)
Potassium: 4 mmol/L (ref 3.5–5.1)
Sodium: 135 mmol/L (ref 135–145)

## 2019-10-20 LAB — SARS CORONAVIRUS 2 BY RT PCR (HOSPITAL ORDER, PERFORMED IN ~~LOC~~ HOSPITAL LAB): SARS Coronavirus 2: NEGATIVE

## 2019-10-20 MED ORDER — LORAZEPAM 0.5 MG PO TABS
0.5000 mg | ORAL_TABLET | Freq: Two times a day (BID) | ORAL | 0 refills | Status: DC
Start: 2019-10-20 — End: 2019-11-27

## 2019-10-20 MED ORDER — AMLODIPINE BESYLATE 2.5 MG PO TABS: 2.5000 mg | ORAL_TABLET | Freq: Every day | ORAL | Status: DC

## 2019-10-20 MED ORDER — HYDROCODONE-ACETAMINOPHEN 5-325 MG PO TABS: 1.0000 | ORAL_TABLET | Freq: Four times a day (QID) | ORAL | 0 refills | Status: DC | PRN

## 2019-10-20 MED ORDER — FUROSEMIDE 20 MG PO TABS: 20.0000 mg | ORAL_TABLET | Freq: Every day | ORAL | 0 refills | Status: DC

## 2019-10-20 NOTE — Social Work (Signed)
Clinical Social Worker facilitated patient discharge including contacting patient family and facility to confirm patient discharge plans.  Clinical information faxed to facility and family agreeable with plan.  CSW arranged ambulance transport via [facility] to [facility] RN to call [phone]  with report prior to discharge.  Clinical Social Worker will sign off for now as social work intervention is no longer needed. Please consult [place] again if new need arises.  Octavio Graves, MSW, LCSW Clinical Social Worker

## 2019-10-20 NOTE — TOC Transition Note (Addendum)
Transition of Care [facility]) - CM/SW Discharge Note   Patient Details  Name: Yolanda Baker MRN: [id number] Date of Birth: 02-07-1924  [facility] CM/SW Contact:  Doy Hutching, LCSW Phone Number: 10/20/2019, 3:01 PM   Clinical Narrative:    This CSW assisted CSW Cherish with discharge. This Clinical research associate spoke with pt daughter [name], confirmed they prefer pt discharge in ambulance. Received fax number for [name] and will fax paperwork to [phone]. Pt will need signed scripts (have contacted Dr. Catha Gosselin) and CSW Cherish confirmed DNR already signed and on chart.   RN to call report to [facility]. Pt placed on [facility] list which is per their report extensive. Will request RN to order dinner for pt. Pt daughter aware of this potential delay.    Final next level of care: Assisted Living Barriers to Discharge: Barriers Resolved   Patient Goals and CMS Choice Patient states their goals for this hospitalization and ongoing recovery are:: return to ALF CMS Medicare.gov Compare Post Acute Care list provided to::  (pt LTC at ALF) Choice offered to / list presented to : Patient  Discharge Placement Patient chooses bed at: [facility] (ALF) Patient to be transferred to facility by: pt daughter to transport Name of family member notified: pt daughter [name] Patient and family notified of of transfer: 10/20/19  Readmission Risk Interventions Readmission Risk Prevention Plan 05/07/2019 02/04/2019  Transportation Screening Complete Complete  PCP or Specialist Appt within 5-7 Days - Complete  PCP or Specialist Appt within 3-5 Days Complete -  Home Care Screening - Complete  Medication Review (RN CM) - Complete  HRI or Home Care Consult Complete -  Social Work Consult for Recovery Care Planning/Counseling Complete -  Palliative Care Screening Complete -  Medication Review Oceanographer) Complete -  Some recent data might be hidden

## 2019-10-20 NOTE — Discharge Instructions (Signed)
Acute Respiratory Failure, Adult  Acute respiratory failure occurs when there is not enough oxygen passing from your lungs to your body. When this happens, your lungs have trouble removing carbon dioxide from the blood. This causes your blood oxygen level to drop too low as carbon dioxide builds up. Acute respiratory failure is a medical emergency. It can develop quickly, but it is temporary if treated promptly. Your lung capacity, or how much air your lungs can hold, may improve with time, exercise, and treatment. What are the causes? There are many possible causes of acute respiratory failure, including:  Lung injury.  Chest injury or damage to the ribs or tissues near the lungs.  Lung conditions that affect the flow of air and blood into and out of the lungs, such as pneumonia, acute respiratory distress syndrome, and cystic fibrosis.  Medical conditions, such as strokes or spinal cord injuries, that affect the muscles and nerves that control breathing.  Blood infection (sepsis).  Inflammation of the pancreas (pancreatitis).  A blood clot in the lungs (pulmonary embolism).  A large-volume blood transfusion.  Burns.  Near-drowning.  Seizure.  Smoke inhalation.  Reaction to medicines.  Alcohol or drug overdose. What increases the risk? This condition is more likely to develop in people who have:  A blocked airway.  Asthma.  A condition or disease that damages or weakens the muscles, nerves, bones, or tissues that are involved in breathing.  A serious infection.  A health problem that blocks the unconscious reflex that is involved in breathing, such as hypothyroidism or sleep apnea.  A lung injury or trauma. What are the signs or symptoms? Trouble breathing is the main symptom of acute respiratory failure. Symptoms may also include:  Rapid breathing.  Restlessness or anxiety.  Skin, lips, or fingernails that appear blue (cyanosis).  Rapid heart  rate.  Abnormal heart rhythms (arrhythmias).  Confusion or changes in behavior.  Tiredness or loss of energy.  Feeling sleepy or having a loss of consciousness. How is this diagnosed? Your health care provider can diagnose acute respiratory failure with a medical history and physical exam. During the exam, your health care provider will listen to your heart and check for crackling or wheezing sounds in your lungs. Your may also have tests to confirm the diagnosis and determine what is causing respiratory failure. These tests may include:  Measuring the amount of oxygen in your blood (pulse oximetry). The measurement comes from a small device that is placed on your finger, earlobe, or toe.  Other blood tests to measure blood gases and to look for signs of infection.  Sampling your cerebral spinal fluid or tracheal fluid to check for infections.  Chest X-ray to look for fluid in spaces that should be filled with air.  Electrocardiogram (ECG) to look at the heart's electrical activity. How is this treated? Treatment for this condition usually takes places in a hospital intensive care unit (ICU). Treatment depends on what is causing the condition. It may include one or more treatments until your symptoms improve. Treatment may include:  Supplemental oxygen. Extra oxygen is given through a tube in the nose, a face mask, or a hood.  A device such as a continuous positive airway pressure (CPAP) or bi-level positive airway pressure (BiPAP or BPAP) machine. This treatment uses mild air pressure to keep the airways open. A mask or other device will be placed over your nose or mouth. A tube that is connected to a motor will deliver oxygen through the   mask.  Ventilator. This treatment helps move air into and out of the lungs. This may be done with a bag and mask or a machine. For this treatment, a tube is placed in your windpipe (trachea) so air and oxygen can flow to the lungs.  Extracorporeal  membrane oxygenation (ECMO). This treatment temporarily takes over the function of the heart and lungs, supplying oxygen and removing carbon dioxide. ECMO gives the lungs a chance to recover. It may be used if a ventilator is not effective.  Tracheostomy. This is a procedure that creates a hole in the neck to insert a breathing tube.  Receiving fluids and medicines.  Rocking the bed to help breathing. Follow these instructions at home:  Take over-the-counter and prescription medicines only as told by your health care provider.  Return to normal activities as told by your health care provider. Ask your health care provider what activities are safe for you.  Keep all follow-up visits as told by your health care provider. This is important. How is this prevented? Treating infections and medical conditions that may lead to acute respiratory failure can help prevent the condition from developing. Contact a health care provider if:  You have a fever.  Your symptoms do not improve or they get worse. Get help right away if:  You are having trouble breathing.  You lose consciousness.  Your have cyanosis or turn blue.  You develop a rapid heart rate.  You are confused. These symptoms may represent a serious problem that is an emergency. Do not wait to see if the symptoms will go away. Get medical help right away. Call your local emergency services (911 in the [place] Do not drive yourself to the hospital. This information is not intended to replace advice given to you by your health care provider. Make sure you discuss any questions you have with your health care provider. Document Revised: 01/25/2017 Document Reviewed: 08/31/2015 Elsevier Patient Education  2020 Elsevier Inc.  Heart Failure, Diagnosis  Heart failure means that your heart is not able to pump blood in the right [place]. This makes it hard for your body to work well. Heart failure is usually a long-term (chronic) condition.  You must take good care of yourself and follow your treatment plan from your doctor. What are the causes? This condition may be caused by:  High blood pressure.  Build up of cholesterol and fat in the arteries.  Heart attack. This injures the heart muscle.  Heart valves that do not open and close properly.  Damage of the heart muscle. This is also called cardiomyopathy.  Lung disease.  Abnormal heart rhythms. What increases the risk? The risk of heart failure goes up as a person ages. This condition is also more likely to develop in people who:  Are overweight.  Are female.  Smoke or chew tobacco.  Abuse alcohol or illegal drugs.  Have taken medicines that can damage the heart.  Have diabetes.  Have abnormal heart rhythms.  Have thyroid problems.  Have low blood counts (anemia). What are the signs or symptoms? Symptoms of this condition include:  Shortness of breath.  Coughing.  Swelling of the feet, ankles, legs, or belly.  Losing weight for no reason.  Trouble breathing.  Waking from sleep because of the need to sit up and get more air.  Rapid heartbeat.  Being very tired.  Feeling dizzy, or feeling like you may pass out (faint).  Having no desire to eat.  Feeling like you   may vomit (nauseous).  Peeing (urinating) more at night.  Feeling confused. How is this treated?     This condition may be treated with:  Medicines. These can be given to treat blood pressure and to make the heart muscles stronger.  Changes in your daily life. These may include eating a healthy diet, staying at a healthy body weight, quitting tobacco and illegal drug use, or doing exercises.  Surgery. Surgery can be done to open blocked valves, or to put devices in the heart, such as pacemakers.  A donor heart (heart transplant). You will receive a healthy heart from a donor. Follow these instructions at home:  Treat other conditions as told by your doctor. These may  include high blood pressure, diabetes, thyroid disease, or abnormal heart rhythms.  Learn as much as you can about heart failure.  Get support as you need it.  Keep all follow-up visits as told by your doctor. This is important. Summary  Heart failure means that your heart is not able to pump blood in the right [place].  This condition is caused by high blood pressure, heart attack, or damage of the heart muscle.  Symptoms of this condition include shortness of breath and swelling of the feet, ankles, legs, or belly. You may also feel very tired or feel like you may vomit.  You may be treated with medicines, surgery, or changes in your daily life.  Treat other health conditions as told by your doctor. This information is not intended to replace advice given to you by your health care provider. Make sure you discuss any questions you have with your health care provider. Document Revised: 05/02/2018 Document Reviewed: 05/02/2018 Elsevier Patient Education  2020 Elsevier Inc.  

## 2019-10-20 NOTE — NC FL2 (Addendum)
[place] MEDICAID FL2 LEVEL OF CARE SCREENING TOOL     IDENTIFICATION  Patient Name: Yolanda Baker Birthdate: [birth year] Sex: female Admission Date (Current Location): 10/16/2019  [facility] [place] Number:  Producer, television/film/video and Address:  [facility], [address], [place], [place] [address]      Provider Number: [id number]  Attending Physician Name and Address:  Edsel Petrin, DO  Relative Name and Phone Number:  Janne Napoleon [phone]    Current Level of Care: Hospital Recommended Level of Care: Assisted Living Facility Prior Approval Number:    Date Approved/Denied:   PASRR Number:    Discharge Plan: Other (Comment) (assisted living [facility])    Current Diagnoses: Patient Active Problem List   Diagnosis Date Noted  . Hypoxia   . Acute respiratory failure with hypoxia (HCC) 10/16/2019  . Persistent atrial fibrillation (HCC) 09/25/2019  . Secondary hypercoagulable state (HCC) 09/25/2019  . Atrial fibrillation with RVR (HCC) 09/11/2019  . PAF (paroxysmal atrial fibrillation) (HCC) 08/04/2019  . Goals of care, counseling/discussion   . Palliative care by specialist   . Pressure injury of skin 05/07/2019  . Periprosthetic fracture around internal prosthetic hip joint 05/06/2019  . DNR (do not resuscitate) present on admission 02/04/2019  . Acute on chronic diastolic heart failure (HCC)   . Acute on chronic congestive heart failure (HCC) 02/03/2019  . GAD (generalized anxiety disorder) 01/15/2018  . Schizoaffective disorder (HCC) 01/15/2018  . Hyperlipidemia LDL goal <70 07/01/2014  . Constipation chronic 07/01/2014  . Acute CVA (cerebrovascular accident) (HCC)   . Acute blood loss anemia 10/12/2013  . Postoperative anemia due to acute blood loss 10/04/2013  . Postop Transfusion 10/04/2013  . Closed fracture of femur with nonunion 10/03/2013  . Femur fracture, right (HCC) 10/03/2013  . Closed femur fracture (HCC)  10/03/2013  . UTI (urinary tract infection) 06/23/2013  . Dementia (HCC) 06/09/2013  . Anxiety 06/09/2013  . Atrial flutter (HCC) 06/07/2013  . Hip fracture (HCC) 06/04/2013  . Depression 06/04/2013  . Anxiety state 06/04/2013  . Essential hypertension 06/04/2013  . Closed right hip fracture (HCC) 06/04/2013    Orientation RESPIRATION BLADDER Height & Weight     Self, Situation, Time, Place  Normal Incontinent Weight: 103 lb 2.8 oz (46.8 kg) Height:  5\' 2"  (157.5 cm)  BEHAVIORAL SYMPTOMS/MOOD NEUROLOGICAL BOWEL NUTRITION STATUS      Continent Diet (regular diet; thin liquids)  AMBULATORY STATUS COMMUNICATION OF NEEDS Skin   Extensive Assist Verbally Other (Comment) (dry w/ generalized ecchymosis)                       Personal Care Assistance Level of Assistance  Dressing, Bathing, Feeding Bathing Assistance: Maximum assistance Feeding assistance: Limited assistance Dressing Assistance: Maximum assistance     Functional Limitations Info  Sight, Hearing, Speech Sight Info: Impaired Hearing Info: Impaired Speech Info: Adequate    SPECIAL CARE FACTORS FREQUENCY  PT (By licensed PT), OT (By licensed OT)     PT Frequency: 5x week OT Frequency: 5x week            Contractures Contractures Info: Not present    Additional Factors Info  Code Status, Allergies, Psychotropic Code Status Info: DNR Allergies Info: Flagyl (Metronidazole) Psychotropic Info: citalopram (CELEXA) tablet 20 mg daily PO; LORazepam (ATIVAN) tablet 0.5 mg daily at bedtime PO; ziprasidone (GEODON) capsule 20 mg daily PO         Current Medications (10/20/2019):  This is the current hospital active medication list Current Facility-Administered Medications  Medication Dose Route Frequency Provider Last Rate Last Admin  . acetaminophen (TYLENOL) tablet 650 mg  650 mg Oral Q6H PRN Pahwani, Rinka R, MD       Or  . acetaminophen (TYLENOL) suppository 650 mg  650 mg Rectal Q6H PRN Pahwani, Rinka  R, MD      . acetaminophen (TYLENOL) tablet 500 mg  500 mg Oral BID PRN Pahwani, Rinka R, MD      . albuterol (PROVENTIL) (2.5 MG/3ML) 0.083% nebulizer solution 2.5 mg  2.5 mg Nebulization Q2H PRN Pahwani, Rinka R, MD      . amiodarone (PACERONE) tablet 200 mg  200 mg Oral Daily Pahwani, Rinka R, MD   200 mg at 10/20/19 0915  . amLODipine (NORVASC) tablet 5 mg  5 mg Oral Daily Edsel Petrin, DO   5 mg at 10/19/19 0801  . apixaban (ELIQUIS) tablet 2.5 mg  2.5 mg Oral BID Pahwani, Rinka R, MD   2.5 mg at 10/20/19 0914  . citalopram (CELEXA) tablet 20 mg  20 mg Oral Daily Pahwani, Rinka R, MD   20 mg at 10/20/19 0914  . furosemide (LASIX) injection 20 mg  20 mg Intravenous Daily Edsel Petrin, DO   20 mg at 10/20/19 2706  . furosemide (LASIX) injection 40 mg  40 mg Intravenous Once Pahwani, Rinka R, MD      . galantamine (RAZADYNE ER) 24 hr capsule 8 mg  8 mg Oral Q breakfast Pahwani, Rinka R, MD   8 mg at 10/20/19 0914  . HYDROcodone-acetaminophen (NORCO/VICODIN) 5-325 MG per tablet 1-2 tablet  1-2 tablet Oral Q6H PRN Ollen Bowl, MD   2 tablet at 10/16/19 2218  . LORazepam (ATIVAN) tablet 0.5 mg  0.5 mg Oral QHS Pahwani, Rinka R, MD   0.5 mg at 10/19/19 2118  . metoprolol tartrate (LOPRESSOR) tablet 75 mg  75 mg Oral BID Edsel Petrin, DO   75 mg at 10/19/19 2117  . ondansetron (ZOFRAN) tablet 4 mg  4 mg Oral Q6H PRN Pahwani, Rinka R, MD       Or  . ondansetron (ZOFRAN) injection 4 mg  4 mg Intravenous Q6H PRN Pahwani, Rinka R, MD      . simvastatin (ZOCOR) tablet 10 mg  10 mg Oral q1800 Pahwani, Rinka R, MD   10 mg at 10/19/19 1733  . ziprasidone (GEODON) capsule 20 mg  20 mg Oral Daily Pahwani, Rinka R, MD   20 mg at 10/20/19 1126     Discharge Medications: STOP taking these medications   saline Gel     TAKE these medications   acetaminophen 500 MG tablet Commonly known as: TYLENOL Take 1 tablet (500 mg total) by mouth 2 (two) times daily as needed for mild pain or  moderate pain.   amiodarone 200 MG tablet Commonly known as: PACERONE Take 1 tablet (200 mg total) by mouth 2 (two) times daily. What changed: when to take this   amLODipine 2.5 MG tablet Commonly known as: NORVASC Take 1 tablet (2.5 mg total) by mouth daily. What changed:   medication strength  how much to take   apixaban 2.5 MG Tabs tablet Commonly known as: ELIQUIS Take 1 tablet (2.5 mg total) by mouth 2 (two) times daily.   CERTAVITE SENIOR/ANTIOXIDANT PO Take 1 tablet by mouth daily.   chlorhexidine 0.12 % solution Commonly known as: PERIDEX Use as directed 15 mLs in the mouth or  throat 2 (two) times daily. Swish and spit   citalopram 20 MG tablet Commonly known as: CELEXA Take 1 tablet (20 mg total) by mouth daily.   CORICIDIN 2-325 MG Tabs Generic drug: Chlorpheniramine-APAP Take 1 tablet by mouth 2 (two) times daily as needed (congestion).   furosemide 20 MG tablet Commonly known as: LASIX Take 1 tablet (20 mg total) by mouth daily. What changed: when to take this   galantamine 8 MG 24 hr capsule Commonly known as: RAZADYNE ER Take 8 mg by mouth daily with breakfast.   HYDROcodone-acetaminophen 5-325 MG tablet Commonly known as: NORCO/VICODIN Take 1-2 tablets by mouth every 6 (six) hours as needed for moderate pain.   loperamide 2 MG capsule Commonly known as: IMODIUM Take 2 mg by mouth as needed for diarrhea or loose stools.   LORazepam 0.5 MG tablet Commonly known as: ATIVAN Take 1 tablet (0.5 mg total) by mouth 2 (two) times daily. What changed: when to take this   Metoprolol Tartrate 75 MG Tabs Take 75 mg by mouth 2 (two) times daily.   potassium chloride SA 20 MEQ tablet Commonly known as: KLOR-CON Take 20 mEq by mouth every other day.   psyllium 0.52 g capsule Commonly known as: REGULOID Take 0.52 g by mouth daily.   simvastatin 10 MG tablet Commonly known as: ZOCOR Take 1 tablet (10 mg total) by mouth daily at 6  PM.   Stiolto Respimat 2.5-2.5 MCG/ACT Aers Generic drug: Tiotropium Bromide-Olodaterol Inhale 2 puffs into the lungs daily.   Systane 0.4-0.3 % Soln Generic drug: Polyethyl Glycol-Propyl Glycol Place 1 drop into both eyes every hour as needed (dry eyes/irritation).   ziprasidone 20 MG capsule Commonly known as: GEODON TAKE ONE CAPSULE BY MOUTH DAILY IN THE AFTERNOON **DO NOT CRUSH** **NO REFILLS** What changed: See the new instructions.      Relevant Imaging Results:  Relevant Lab Results:   Additional Information SS#[id number] [address] [place], [place]

## 2019-10-20 NOTE — Care Management Important Message (Signed)
Important Message  Patient Details  Name: Yolanda Baker MRN: [id number] Date of Birth: [birth year]   Medicare Important Message Given:  Yes     Renie Ora 10/20/2019, 2:00 PM

## 2019-10-20 NOTE — Progress Notes (Addendum)
8/24: CSW spoke with [facility] at [facility], updated her on diet, o2 status, etc.

## 2019-10-20 NOTE — Progress Notes (Signed)
[employer]. [facility] Tech, [place] received report. Waiting on PTAR to transfer.

## 2019-10-20 NOTE — Discharge Summary (Signed)
Physician Discharge Summary  Yolanda Baker FSE:[id number] DOB: 1923/12/09 DOA: 10/16/2019  PCP: Rodrigo Ran, MD  Admit date: 10/16/2019 Discharge date: 10/20/2019  Time spent: 45 minutes  Recommendations for Outpatient Follow-up:  Patient will be discharged to [facility] assisted living facility.  Patient will need to follow up with primary care provider within one week of discharge, repeat BMP and discuss blood pressure management.  Patient should continue medications as prescribed.  Patient should follow a with a heart healthy diet.   Discharge Diagnoses:  Acute hypoxemic respiratory failure/acute on chronic systolic CHF exacerbation Elevated D-dimer Essential hypertension Paroxysmal atrial fibrillation Hypokalemia Depression/anxiety Dementia Hyperlipidemia Chronic kidney disease, stage IIIa History of right hip periprosthetic fracture  Discharge Condition: Stable  Diet recommendation: Heart healthy  Filed Weights   10/18/19 0543 10/19/19 0215 10/20/19 0351  Weight: 46.7 kg 44.9 kg 46.8 kg    History of present illness:  on 10/16/2019 by Dr. Alvester Morin a 86 y.o.femalewith medical history significant ofparoxysmal A. fib-on Eliquis, hypertension, hyperlipidemia, depression/anxiety, dementia, systolic CHF with ejection fraction of 45 to 50%, arthritis, CVA, osteoporosis presents to emergency department with shortness of breath and hypoxia.  Patient's daughter at bedside is the historian. She tells me that patient was doing fine this morning however she suddenly developed shortness of breath afterPT/OTand was noted to be hypoxic with oxygen saturation of 88% on room air and improved from 2 L of oxygen via nasal cannula. Patient brought to emergency department for further evaluation and management.  Patient takes Lasix 20 mg every other day, reports chronic dry cough, chronic orthopnea however denies leg swelling, PND, chest pain, wheezing, fever,  chills, nausea, vomiting, abdominal pain, headache, blurry vision, lightheadedness, dizziness, urinary or bowel changes.   She has history of hip fracture which managed conservatively with brace. She is not a candidate for surgery.  No history of smoking, alcohol, street drug use. She is wheelchair-bound.  Hospital Course:  Acute hypoxemic respiratory failure/acute on chronic systolic CHF exacerbation -Presented with tachypnea in the 20s as well as hypoxia noted to be in the 70s to mid 80s on room air -Patient was placed on 2 L of nasal cannula and continues to be on 2L -BNP 415.6 although 1 month prior, it was near 600 -Chest x-ray; bilateral pleural effusions persistent from CTA done 1 month prior -Continue to attempt to wean oxygen  -Echocardiogram 09/11/2019 showed an EF of 45 to 50%, LV global hypokinesis.  Mild left LVH.  LV diastolic parameters indeterminate.  Moderate TR, mild aortic valve sclerosis.   -Patient normally takes Lasix 20 mg every other day -was on IV Lasix 40 mg daily, and decreased to 20 mg IV daily -Monitor intake and output, daily weights -Have been able to wean off of oxygen, currently maintaining saturations in the high 90s on room air -Will discharge patient with 20 mg of oral Lasix daily  Elevated D-dimer -D-dimer elevated at 1.59 -CTA unremarkable for PE -patient currently on eliquis  Essential hypertension -Continue amlodipine, metoprolol and Lasix  -BP stable -reduced dose of amlodipine -Should discuss hypertension management with PCP  Paroxysmal atrial fibrillation -CHA2DS2-VASc 8 -Continue Eliquis, amiodarone, metoprolol  Hypokalemia -Resolved with supplementation, currently 4.0 -Continue to monitor BMP and replace potassium as needed as an outpatient  Depression/anxiety -Stable, continue Celexa, Ativan  Dementia -Continue galantamine  Hyperlipidemia -Continue statin  Chronic kidney disease, stage IIIa -Creatinine appears  stable  History of right hip periprosthetic fracture -Chronic -Patient uses a brace when  she is out of bed -She tells me that she does not walk she uses a wheelchair -Not a candidate for surgery -Continue pain control  Code status: DNR  Procedures: None  Consultations: None  Discharge Exam: Vitals:   10/20/19 0919 10/20/19 1131  BP: 107/70 113/79  Pulse: 69 94  Resp: 16 20  Temp:  98.4 F (36.9 C)  SpO2: 96% 99%    Exam  General: Well developed, chronically ill appearing, elderly, frail, NAD  HEENT: NCAT, mucous membranes moist.   Cardiovascular: S1 S2 auscultated, irregularly irregular  Respiratory: diminished breath sounds, but clear  Abdomen: Soft, nontender, nondistended, + bowel sounds  Extremities: warm dry without cyanosis clubbing or edema  Neuro: AAOx3, nonfocal  Psych: Appropriate mood and affect, pleasant   Discharge Instructions Discharge Instructions    Discharge instructions   Complete by: As directed    Patient will be discharged to [facility] assisted living facility.  Patient will need to follow up with primary care provider within one week of discharge, repeat BMP and discuss blood pressure management.  Patient should continue medications as prescribed.  Patient should follow a with a heart healthy diet.     Allergies as of 10/20/2019      Reactions   Flagyl [metronidazole]    Fingers get "numb"      Medication List    STOP taking these medications   saline Gel     TAKE these medications   acetaminophen 500 MG tablet Commonly known as: TYLENOL Take 1 tablet (500 mg total) by mouth 2 (two) times daily as needed for mild pain or moderate pain.   amiodarone 200 MG tablet Commonly known as: PACERONE Take 1 tablet (200 mg total) by mouth 2 (two) times daily. What changed: when to take this   amLODipine 2.5 MG tablet Commonly known as: NORVASC Take 1 tablet (2.5 mg total) by mouth daily. What changed:   medication  strength  how much to take   apixaban 2.5 MG Tabs tablet Commonly known as: ELIQUIS Take 1 tablet (2.5 mg total) by mouth 2 (two) times daily.   CERTAVITE SENIOR/ANTIOXIDANT PO Take 1 tablet by mouth daily.   chlorhexidine 0.12 % solution Commonly known as: PERIDEX Use as directed 15 mLs in the mouth or throat 2 (two) times daily. Swish and spit   citalopram 20 MG tablet Commonly known as: CELEXA Take 1 tablet (20 mg total) by mouth daily.   CORICIDIN 2-325 MG Tabs Generic drug: Chlorpheniramine-APAP Take 1 tablet by mouth 2 (two) times daily as needed (congestion).   furosemide 20 MG tablet Commonly known as: LASIX Take 1 tablet (20 mg total) by mouth daily. What changed: when to take this   galantamine 8 MG 24 hr capsule Commonly known as: RAZADYNE ER Take 8 mg by mouth daily with breakfast.   HYDROcodone-acetaminophen 5-325 MG tablet Commonly known as: NORCO/VICODIN Take 1-2 tablets by mouth every 6 (six) hours as needed for moderate pain.   loperamide 2 MG capsule Commonly known as: IMODIUM Take 2 mg by mouth as needed for diarrhea or loose stools.   LORazepam 0.5 MG tablet Commonly known as: ATIVAN Take 1 tablet (0.5 mg total) by mouth 2 (two) times daily. What changed: when to take this   Metoprolol Tartrate 75 MG Tabs Take 75 mg by mouth 2 (two) times daily.   potassium chloride SA 20 MEQ tablet Commonly known as: KLOR-CON Take 20 mEq by mouth every other day.   psyllium 0.52  g capsule Commonly known as: REGULOID Take 0.52 g by mouth daily.   simvastatin 10 MG tablet Commonly known as: ZOCOR Take 1 tablet (10 mg total) by mouth daily at 6 PM.   Stiolto Respimat 2.5-2.5 MCG/ACT Aers Generic drug: Tiotropium Bromide-Olodaterol Inhale 2 puffs into the lungs daily.   Systane 0.4-0.3 % Soln Generic drug: Polyethyl Glycol-Propyl Glycol Place 1 drop into both eyes every hour as needed (dry eyes/irritation).   ziprasidone 20 MG capsule Commonly  known as: GEODON TAKE ONE CAPSULE BY MOUTH DAILY IN THE AFTERNOON **DO NOT CRUSH** **NO REFILLS** What changed: See the new instructions.      Allergies  Allergen Reactions  . Flagyl [Metronidazole]     Fingers get "numb"    Follow-up Information    Rodrigo Ran, MD. Schedule an appointment as soon as possible for a visit in 1 week(s).   Specialty: Internal Medicine Why: Hospital follow up Contact information: [address] [place] [address] [phone]        Chilton Si, MD .   Specialty: Cardiology Contact information: [address] [place] [address] [place] [address] [phone]                The results of significant diagnostics from this hospitalization (including imaging, microbiology, ancillary and laboratory) are listed below for reference.    Significant Diagnostic Studies: DG Chest 2 View  Result Date: 10/16/2019 CLINICAL DATA:  86 year old female with shortness of breath. Atrial fibrillation EXAM: CHEST - 2 VIEW COMPARISON:  Portable chest 09/13/2019 and earlier. FINDINGS: Stable cardiomegaly and mediastinal contours. Visualized tracheal air column is within normal limits. Large lung volumes. No pneumothorax or pulmonary edema. Small bilateral pleural effusions persist from the CTA last month. No other abnormal pulmonary opacity. Osteopenia. Stable visualized osseous structures. Abdominal Calcified aortic atherosclerosis. Negative visible bowel gas pattern. IMPRESSION: 1. Small bilateral pleural effusions persist from the CTA last month. 2. No other acute cardiopulmonary abnormality. Electronically Signed   By: Odessa Fleming M.D.   On: 10/16/2019 12:37   CT Angio Chest PE W and/or Wo Contrast  Result Date: 10/16/2019 CLINICAL DATA:  Positive D-dimer, shortness of breath, atrial fibrillation EXAM: CT ANGIOGRAPHY CHEST WITH CONTRAST TECHNIQUE: Multidetector CT imaging of the chest was performed using the standard protocol during bolus administration  of intravenous contrast. Multiplanar CT image reconstructions and MIPs were obtained to evaluate the vascular anatomy. CONTRAST:  53mL OMNIPAQUE IOHEXOL 350 MG/ML SOLN COMPARISON:  10/16/2019, 09/10/2019 FINDINGS: Cardiovascular: This is a technically adequate evaluation of the pulmonary vasculature. There are no filling defects or pulmonary emboli. There is biatrial dilatation. No pericardial effusion. Normal caliber of the thoracic aorta. Mild atherosclerosis throughout the thoracic aorta. Mediastinum/Nodes: No enlarged mediastinal, hilar, or axillary lymph nodes. Thyroid gland, trachea, and esophagus demonstrate no significant findings. Lungs/Pleura: There are small bilateral pleural effusions, with minimal dependent atelectasis. No airspace disease or pneumothorax. Central airways are patent. Upper Abdomen: No acute abnormality. Musculoskeletal: No acute or destructive bony lesions. Reconstructed images demonstrate no additional findings. Review of the MIP images confirms the above findings. IMPRESSION: 1. No evidence of pulmonary embolus. 2. Small bilateral pleural effusions and dependent atelectasis. 3.  Aortic Atherosclerosis (ICD10-I70.0). Electronically Signed   By: Sharlet Salina M.D.   On: 10/16/2019 15:13    Microbiology: Recent Results (from the past 240 hour(s))  SARS Coronavirus 2 by RT PCR (hospital order, performed in [facility] hospital lab) Nasopharyngeal Nasopharyngeal Swab     Status: None   Collection Time:  10/16/19  1:20 PM   Specimen: Nasopharyngeal Swab  Result Value Ref Range Status   SARS Coronavirus 2 NEGATIVE NEGATIVE Final    Comment: (NOTE) SARS-CoV-2 target nucleic acids are NOT DETECTED.  The SARS-CoV-2 RNA is generally detectable in upper and lower respiratory specimens during the acute phase of infection. The lowest concentration of SARS-CoV-2 viral copies this assay can detect is 250 copies / mL. A negative result does not preclude SARS-CoV-2 infection and should  not be used as the sole basis for treatment or other patient management decisions.  A negative result may occur with improper specimen collection / handling, submission of specimen other than nasopharyngeal swab, presence of viral mutation(s) within the areas targeted by this assay, and inadequate number of viral copies (<250 copies / mL). A negative result must be combined with clinical observations, patient history, and epidemiological information.  Fact Sheet for Patients:   BoilerBrush.com.cy  Fact Sheet for Healthcare Providers: https://pope.com/  This test is not yet approved or  cleared by the [place] FDA and has been authorized for detection and/or diagnosis of SARS-CoV-2 by FDA under an Emergency Use Authorization (EUA).  This EUA will remain in effect (meaning this test can be used) for the duration of the COVID-19 declaration under Section 564(b)(1) of the Act, 21 U.S.C. section 360bbb-3(b)(1), unless the authorization is terminated or revoked sooner.  Performed at [facility], [address], [place], [place] [address]   SARS Coronavirus 2 by RT PCR (hospital order, performed in [facility] hospital lab) Nasopharyngeal Nasopharyngeal Swab     Status: None   Collection Time: 10/20/19 11:40 AM   Specimen: Nasopharyngeal Swab  Result Value Ref Range Status   SARS Coronavirus 2 NEGATIVE NEGATIVE Final    Comment: (NOTE) SARS-CoV-2 target nucleic acids are NOT DETECTED.  The SARS-CoV-2 RNA is generally detectable in upper and lower respiratory specimens during the acute phase of infection. The lowest concentration of SARS-CoV-2 viral copies this assay can detect is 250 copies / mL. A negative result does not preclude SARS-CoV-2 infection and should not be used as the sole basis for treatment or other patient management decisions.  A negative result may occur with improper specimen collection / handling,  submission of specimen other than nasopharyngeal swab, presence of viral mutation(s) within the areas targeted by this assay, and inadequate number of viral copies (<250 copies / mL). A negative result must be combined with clinical observations, patient history, and epidemiological information.  Fact Sheet for Patients:   BoilerBrush.com.cy  Fact Sheet for Healthcare Providers: https://pope.com/  This test is not yet approved or  cleared by the [place] FDA and has been authorized for detection and/or diagnosis of SARS-CoV-2 by FDA under an Emergency Use Authorization (EUA).  This EUA will remain in effect (meaning this test can be used) for the duration of the COVID-19 declaration under Section 564(b)(1) of the Act, 21 U.S.C. section 360bbb-3(b)(1), unless the authorization is terminated or revoked sooner.  Performed at [facility], [address] [place], [place] [address]      Labs: Basic Metabolic Panel: Recent Labs  Lab 10/16/19 1200 10/16/19 1200 10/16/19 1600 10/16/19 1800 10/17/19 0718 10/18/19 0623 10/19/19 0733 10/20/19 0654  NA 139   < > 137  --  138 136 138 135  K 3.8   < > 3.3*  --  3.8 3.4* 3.7 4.0  CL 101  --   --   --  101 98 99  99  CO2 28  --   --   --  26 29 30 26   GLUCOSE 100*  --   --   --  96 91 98 96  BUN 18  --   --   --  17 20 20 21   CREATININE 1.06*  --   --   --  0.94 1.00 0.99 1.02*  CALCIUM 9.0  --   --   --  8.6* 8.5* 8.5* 8.6*  MG  --   --   --  1.7  --   --   --   --   PHOS  --   --   --  3.6  --   --   --   --    < > = values in this interval not displayed.   Liver Function Tests: Recent Labs  Lab 10/17/19 0718  AST 26  ALT 14  ALKPHOS 55  BILITOT 0.7  PROT 6.8  ALBUMIN 3.2*   No results for input(s): LIPASE, AMYLASE in the last 168 hours. No results for input(s): AMMONIA in the last 168 hours. CBC: Recent Labs  Lab 10/16/19 1200 10/16/19 1600 10/17/19 0718   WBC 7.1  --  7.0  HGB 12.5 12.2 12.1  HCT 39.7 36.0 37.9  MCV 99.0  --  96.2  PLT 273  --  244   Cardiac Enzymes: No results for input(s): CKTOTAL, CKMB, CKMBINDEX, TROPONINI in the last 168 hours. BNP: BNP (last 3 results) Recent Labs    05/14/19 0050 09/10/19 1736 10/16/19 1306  BNP 627.9* 596.7* 415.6*    ProBNP (last 3 results) No results for input(s): PROBNP in the last 8760 hours.  CBG: No results for input(s): GLUCAP in the last 168 hours.     Signed:  09/12/19  [facility] 10/20/2019, 1:58 PM

## 2019-10-21 ENCOUNTER — Encounter: Payer: Self-pay | Admitting: Cardiovascular Disease

## 2019-10-21 ENCOUNTER — Telehealth (INDEPENDENT_AMBULATORY_CARE_PROVIDER_SITE_OTHER): Payer: Medicare PPO | Admitting: Cardiovascular Disease

## 2019-10-21 VITALS — BP 114/74 | HR 82 | Ht 61.0 in | Wt 101.4 lb

## 2019-10-21 DIAGNOSIS — N1831 Chronic kidney disease, stage 3a: Secondary | ICD-10-CM | POA: Diagnosis not present

## 2019-10-21 DIAGNOSIS — Z5181 Encounter for therapeutic drug level monitoring: Secondary | ICD-10-CM

## 2019-10-21 DIAGNOSIS — I5022 Chronic systolic (congestive) heart failure: Secondary | ICD-10-CM | POA: Diagnosis not present

## 2019-10-21 DIAGNOSIS — R488 Other symbolic dysfunctions: Secondary | ICD-10-CM | POA: Diagnosis not present

## 2019-10-21 DIAGNOSIS — M6281 Muscle weakness (generalized): Secondary | ICD-10-CM | POA: Diagnosis not present

## 2019-10-21 DIAGNOSIS — Z79899 Other long term (current) drug therapy: Secondary | ICD-10-CM | POA: Diagnosis not present

## 2019-10-21 DIAGNOSIS — I4892 Unspecified atrial flutter: Secondary | ICD-10-CM

## 2019-10-21 DIAGNOSIS — I10 Essential (primary) hypertension: Secondary | ICD-10-CM

## 2019-10-21 DIAGNOSIS — J9601 Acute respiratory failure with hypoxia: Secondary | ICD-10-CM

## 2019-10-21 DIAGNOSIS — I5033 Acute on chronic diastolic (congestive) heart failure: Secondary | ICD-10-CM | POA: Diagnosis not present

## 2019-10-21 DIAGNOSIS — J90 Pleural effusion, not elsewhere classified: Secondary | ICD-10-CM | POA: Diagnosis not present

## 2019-10-21 DIAGNOSIS — E8809 Other disorders of plasma-protein metabolism, not elsewhere classified: Secondary | ICD-10-CM | POA: Diagnosis not present

## 2019-10-21 DIAGNOSIS — I361 Nonrheumatic tricuspid (valve) insufficiency: Secondary | ICD-10-CM | POA: Diagnosis not present

## 2019-10-21 NOTE — Progress Notes (Signed)
Virtual Visit via Telephone Note   This visit type was conducted due to national recommendations for restrictions regarding the COVID-19 Pandemic (e.g. social distancing) in an effort to limit this patient's exposure and mitigate transmission in our community.  Due to her co-morbid illnesses, this patient is at least at moderate risk for complications without adequate follow up.  This format is felt to be most appropriate for this patient at this time.  The patient did not have access to video technology/had technical difficulties with video requiring transitioning to audio format only (telephone).  All issues noted in this document were discussed and addressed.  No physical exam could be performed with this format.  Please refer to the patient's chart for her  consent to telehealth for [facility].   The patient was identified using 2 identifiers.  Date:  10/21/2019   ID:  Yolanda Baker, DOB February 21, 1924, MRN [id number]  Patient Location: Skilled Nursing Facility Provider Location: Office  PCP:  Rodrigo Ran, MD  Cardiologist:  Chilton Si, MD  Electrophysiologist:  None   Evaluation Performed:  Follow-Up Visit  Chief Complaint:  Shortness of breath  History of Present Illness:    Yolanda Baker is a 86 y.o. female with atrial fibrillation, hypertension, prior stroke, and dementia here for follow-up.  She was admitted 01/2019 with shortness of breath.  She was noted to be in new onset atrial fibrillation with rapid ventricular response.  She was mildly volume overloaded with a BNP of 335 and a pleural effusion on chest x-ray.  Creatinine was 1.8.  Symptoms improved with IV metoprolol and Lasix.  She was started on oral metoprolol and Lasix prior to discharge.  Echo at that time revealed LVEF 50 to 55% with moderate LVH and a possibility of cardiac amyloidosis.  Diastolic function was indeterminate.  Right ventricular function was normal.  She was seen by Edd Fabian, NP, on 01/2019.   The possibility of working her up for amyloidosis was discussed with her daughter and she like to not to pursue it.  She was admitted to the hospital 04/2019 with a right periprosthetic hip fracture.  This occurred in the setting of falling out of her bed.  Conservative treatment with physical therapy and hip bracing was elected.  She was then admitted 04/2019 with shortness of breath thought to be due to pneumonia.  She followed up with Verdon Cummins virtually on 04/2019 and was doing better.  Lately she has been feeling mostly well.  She notes that she is sometimes short of breath. She notices it most when she is active.  She sometimes notes it when laying in bed as well.  She has no orthopnea or PND.  Her weight has been stable around 100lb. She and her daughter have not noticed any edema.  She is still wearing a brace and is non-weight bearing.  She hopes to be able to walk soon but is waiting on a follow-up x-ray.  She has her blood pressure checked sporadically at her assisted living facility.  Her blood pressure tends to run in the 130s to 150s over 60s to 80s in the morning before taking her medication.  Later in the day it is in the 120s over 60s.  Her heart rate is pretty consistently in the 60s to 70s.  She denies palpitations.   At her last social appointment Ms. Dartt reported exertional dyspnea.  She did not appear to be volume overloaded on limited exam.  We recommended that she have  a BNP checked.  This did not occur.  She was admitted 08/2019 with increasing shortness of breath.  She was hypoxic and tachycardic with atrial fibrillation with RVR.  CTA was negative for PE.  She did have pleural effusions and atelectasis.  She was treated with Lasix and rate control.  Echo revealed LVEF 45 to 50% with global hypokinesis.  Family requested cardiology consultation.  She was started on oral amiodarone.  Metoprolol was increased to .  She was again admitted 09/2019 with shortness of breath.  She was hypoxic to 88%  on room air.  She was in atrial fibrillation with a rate-controlled.  She was again admitted 09/2019 with acute on chronic heart failure.  She was doing fine and then acutely became short of breath with physical therapy and Occupational Therapy.  She was hypoxic and BNP was 415.  She had bilateral pleural effusions that were persistent from prior.  She required diuresis with IV Lasix.  At discharge Lasix was increased from 20 mg every other day to daily.  D-dimer was mildly elevated and CT-a was negative for PE.  She was discharged yesterday and is feeling well.  She has no edema and is back working with PT.  Past Medical History:  Diagnosis Date  . Anemia   . Anxiety   . Arthritis    neck  . Bruises easily   . Chronic diastolic CHF (congestive heart failure) (HCC)   . COPD (chronic obstructive pulmonary disease) (HCC)   . Dementia (HCC)   . Depression   . Dysrhythmia    hx A-FLUTTER after hip surg April 2015- take metoprolol  . Eczema   . Falls   . Hip fracture (HCC)   . Hip joint pain    RT  . Hypertension   . Osteoporosis   . Persistent atrial fibrillation (HCC) 08/04/2019  . Pneumonia    august 2017  . Stroke [facility])    Past Surgical History:  Procedure Laterality Date  . ABDOMINAL HYSTERECTOMY    . FRACTURE SURGERY Right 06/05/2013   hip  . HIP ARTHROPLASTY Right 10/03/2013   Procedure: CONVERSION OF PREVIOUS RIGHT HIP SURGERY TO A RIGHT TOTAL HIP ARTHROPLASTY;  Surgeon: Loanne Drilling, MD;  Location: [facility] ORS;  Service: Orthopedics;  Laterality: Right;  . INTRAMEDULLARY (IM) NAIL INTERTROCHANTERIC Right 06/05/2013   Procedure: INTRAMEDULLARY (IM) NAIL INTERTROCHANTRIC;  Surgeon: Loanne Drilling, MD;  Location: [facility]S;  Service: Orthopedics;  Laterality: Right;  . JOINT REPLACEMENT     left hip  . left hip Left 2008  . LEG SURGERY Right    x2     Current Meds  Medication Sig  . acetaminophen (TYLENOL) 500 MG tablet Take 1 tablet (500 mg total) by mouth 2 (two) times daily  as needed for mild pain or moderate pain.  Marland Kitchen amiodarone (PACERONE) 200 MG tablet Take 1 tablet (200 mg total) by mouth 2 (two) times daily.  Marland Kitchen amLODipine (NORVASC) 2.5 MG tablet Take 1 tablet (2.5 mg total) by mouth daily.  Marland Kitchen apixaban (ELIQUIS) 2.5 MG TABS tablet Take 1 tablet (2.5 mg total) by mouth 2 (two) times daily.  . chlorhexidine (PERIDEX) 0.12 % solution Use as directed 15 mLs in the mouth or throat 2 (two) times daily. Swish and spit  . Chlorpheniramine-APAP (CORICIDIN) 2-325 MG TABS Take 1 tablet by mouth 2 (two) times daily as needed (congestion).  . citalopram (CELEXA) 20 MG tablet Take 1 tablet (20 mg total) by mouth daily.  Marland Kitchen  furosemide (LASIX) 20 MG tablet Take 1 tablet (20 mg total) by mouth daily.  Marland Kitchen galantamine (RAZADYNE ER) 8 MG 24 hr capsule Take 8 mg by mouth daily with breakfast.  . HYDROcodone-acetaminophen (NORCO/VICODIN) 5-325 MG tablet Take 1-2 tablets by mouth every 6 (six) hours as needed for moderate pain.  Marland Kitchen loperamide (IMODIUM) 2 MG capsule Take 2 mg by mouth as needed for diarrhea or loose stools.   Marland Kitchen LORazepam (ATIVAN) 0.5 MG tablet Take 1 tablet (0.5 mg total) by mouth 2 (two) times daily.  . Metoprolol Tartrate 75 MG TABS Take 75 mg by mouth 2 (two) times daily.  Bertram Gala Glycol-Propyl Glycol (SYSTANE) 0.4-0.3 % SOLN Place 1 drop into both eyes every hour as needed (dry eyes/irritation).  . potassium chloride SA (K-DUR,KLOR-CON) 20 MEQ tablet Take 20 mEq by mouth every other day.  . psyllium (REGULOID) 0.52 g capsule Take 0.52 g by mouth daily.  . simvastatin (ZOCOR) 10 MG tablet Take 1 tablet (10 mg total) by mouth daily at 6 PM.  . STIOLTO RESPIMAT 2.5-2.5 MCG/ACT AERS Inhale 2 puffs into the lungs daily.  . ziprasidone (GEODON) 20 MG capsule TAKE ONE CAPSULE BY MOUTH DAILY IN THE AFTERNOON **DO NOT CRUSH** **NO REFILLS** (Patient taking differently: Take 20 mg by mouth daily. )     Allergies:   Flagyl [metronidazole]   Social History   Tobacco Use   . Smoking status: Never Smoker  . Smokeless tobacco: Never Used  Vaping Use  . Vaping Use: Never used  Substance Use Topics  . Alcohol use: No    Alcohol/week: 0.0 standard drinks  . Drug use: No     Family Hx: The patient's family history includes Heart attack in her father; Hypertension in her brother, father, and sister; Stroke in her sister.  ROS:   Please see the history of present illness.     All other systems reviewed and are negative.   Prior CV studies:   The following studies were reviewed today:  Echo 01/2019: 1. Left ventricular ejection fraction, by visual estimation, is 50 to  55%. The left ventricle has low normal function. There is moderately  increased left ventricular hypertrophy. The LV morphology could be  consistent with cardiac amyloidosis.  2. Left ventricular diastolic parameters are indeterminate.  3. The left ventricle has no regional wall motion abnormalities.  4. Global right ventricle has normal systolic function.The right  ventricular size is normal. No increase in right ventricular wall  thickness.  5. Left atrial size was mildly dilated.  6. Right atrial size was mildly dilated.  7. Trivial pericardial effusion is present.  8. The mitral valve is normal in structure. Mild mitral valve  regurgitation. No evidence of mitral stenosis.  9. The tricuspid valve is normal in structure. Tricuspid valve  regurgitation is trivial.  10. The aortic valve is tricuspid. Aortic valve regurgitation is not  visualized. No evidence of aortic valve sclerosis or stenosis.  11. The tricuspid regurgitant velocity is 2.48 m/s, and with an assumed  right atrial pressure of 8 mmHg, the estimated right ventricular systolic  pressure is mildly elevated at 32.6 mmHg.  12. The inferior vena cava is normal in size with <50% respiratory  variability, suggesting right atrial pressure of 8 mmHg.   Echo 08/2019: 1. Left ventricular ejection fraction, by  estimation, is 45 to 50%. The  left ventricle has mildly decreased function. The left ventricle  demonstrates global hypokinesis. There is mild left ventricular  hypertrophy.  Left ventricular diastolic parameters  are indeterminate.  2. Right ventricular systolic function is normal. The right ventricular  size is normal. There is normal pulmonary artery systolic pressure. The  estimated right ventricular systolic pressure is 27.6 mmHg.  3. Left atrial size was moderately dilated.  4. Right atrial size was mildly dilated.  5. The mitral valve is normal in structure. Mild mitral valve  regurgitation. No evidence of mitral stenosis.  6. Tricuspid valve regurgitation is moderate.  7. The aortic valve is tricuspid. Aortic valve regurgitation is not  visualized. Mild aortic valve sclerosis is present, with no evidence of  aortic valve stenosis.  8. The inferior vena cava is normal in size with greater than 50%  respiratory variability, suggesting right atrial pressure of 3 mmHg.   Labs/Other Tests and Data Reviewed:    EKG:  No ECG reviewed.   Recent Labs: 09/11/2019: TSH 5.077 10/16/2019: B Natriuretic Peptide 415.6; Magnesium 1.7 10/17/2019: ALT 14; Hemoglobin 12.1; Platelets 244 10/20/2019: BUN 21; Creatinine, Ser 1.02; Potassium 4.0; Sodium 135   Recent Lipid Panel Lab Results  Component Value Date/Time   CHOL 148 06/30/2014 02:46 AM   TRIG 43 06/30/2014 02:46 AM   HDL 59 06/30/2014 02:46 AM   CHOLHDL 2.5 06/30/2014 02:46 AM   LDLCALC 80 06/30/2014 02:46 AM    Wt Readings from Last 3 Encounters:  10/21/19 101 lb 6.4 oz (46 kg)  10/20/19 103 lb 2.8 oz (46.8 kg)  09/25/19 103 lb 12.8 oz (47.1 kg)     Objective:    BP 114/74   Pulse 82   Ht 5\' 1"  (1.549 m)   Wt 101 lb 6.4 oz (46 kg)   SpO2 93%   BMI 19.16 kg/m  GENERAL: Sounds well.  No acute distress. RESP: Respirations unlabored NEURO:  Speech fluent.   PSYCH:  Oriented to person and place  ASSESSMENT &  PLAN:    # Persistent atrial fibrillation: Ms. Goren is in atrial fibrillation.  Rates are controlled.  This could certainly be contributing to her heart failure exacerbations.  Her daughter would like to avoid cardioversion for now.  This could be considered in the future if she has recurrent hospitalization for heart failure.    Continue metoprolol, amiodarone, and Eliquis.  # Chronic systolic and diastolic heart failure: # Hypertension:  Ms. Waggoner has been admitted multiple times with heart failure exacerbations.  Agree with increasing her Lasix to daily.  Check a basic metabolic panel in a week.  Her systolic dysfunction is mild.  Okay to continue amlodipine and metoprolol tartrate.  Blood pressure is well-controlled.  She does sometimes have some bradycardia once in sinus rhythm.  We will need to continue to monitor.  # Hyperlipidemia:  Continue simvastatin.  COVID-19 Education: The signs and symptoms of COVID-19 were discussed with the patient and how to seek care for testing (follow up with PCP or arrange E-visit).  The importance of social distancing was discussed today.  Time:   Today, I have spent Einar Gip with the patient with telehealth technology discussing the above problems.     Medication Adjustments/Labs and Tests Ordered: Current medicines are reviewed at length with the patient today.  Concerns regarding medicines are outlined above.   Tests Ordered: Orders Placed This Encounter  Procedures  . Basic metabolic panel    Medication Changes: No orders of the defined types were placed in this encounter.   Follow Up:  In Person in 2 month(s)  Signed, , MD  10/21/2019 2:55 PM    [facility]

## 2019-10-21 NOTE — Patient Instructions (Signed)
Medication Instructions:  Your physician recommends that you continue on your current medications as directed. Please refer to the Current Medication list given to you today.  *If you need a refill on your cardiac medications before your next appointment, please call your pharmacy*  Lab Work: BMET IN 1 [facility] RESULTS TO [phone]  Testing/Procedures: NONE  Follow-Up:  12/21/2019 AT 9:40 WITH DR [facility]

## 2019-10-22 ENCOUNTER — Ambulatory Visit: Payer: Medicare PPO | Admitting: Cardiovascular Disease

## 2019-10-22 DIAGNOSIS — S72301D Unspecified fracture of shaft of right femur, subsequent encounter for closed fracture with routine healing: Secondary | ICD-10-CM | POA: Diagnosis not present

## 2019-10-22 DIAGNOSIS — R41841 Cognitive communication deficit: Secondary | ICD-10-CM | POA: Diagnosis not present

## 2019-10-22 DIAGNOSIS — R488 Other symbolic dysfunctions: Secondary | ICD-10-CM | POA: Diagnosis not present

## 2019-10-22 DIAGNOSIS — M6281 Muscle weakness (generalized): Secondary | ICD-10-CM | POA: Diagnosis not present

## 2019-10-23 ENCOUNTER — Telehealth: Payer: Self-pay | Admitting: Cardiovascular Disease

## 2019-10-23 DIAGNOSIS — R41841 Cognitive communication deficit: Secondary | ICD-10-CM | POA: Diagnosis not present

## 2019-10-23 DIAGNOSIS — M6281 Muscle weakness (generalized): Secondary | ICD-10-CM | POA: Diagnosis not present

## 2019-10-23 DIAGNOSIS — R488 Other symbolic dysfunctions: Secondary | ICD-10-CM | POA: Diagnosis not present

## 2019-10-23 NOTE — Telephone Encounter (Signed)
Spoke with daughter and OT was out seeing patient this am O2 sats were in 70's on arrival with HR "all over the place"  OT was able to get O2 levels up but suggested she have oxygen on hand.  She is not with her mother currently  Patient uses [facility] now, advised daughter to see about MD seeing patient to assess today given her recent hospitalization.  Daughter verbalized understanding and will call and see about appointment today

## 2019-10-23 NOTE — Telephone Encounter (Signed)
New Message:    Daughter called and said she think pt may need some supplementary oxygen.

## 2019-10-26 DIAGNOSIS — M6281 Muscle weakness (generalized): Secondary | ICD-10-CM | POA: Diagnosis not present

## 2019-10-26 DIAGNOSIS — R488 Other symbolic dysfunctions: Secondary | ICD-10-CM | POA: Diagnosis not present

## 2019-10-27 DIAGNOSIS — S72301D Unspecified fracture of shaft of right femur, subsequent encounter for closed fracture with routine healing: Secondary | ICD-10-CM | POA: Diagnosis not present

## 2019-10-27 DIAGNOSIS — M6281 Muscle weakness (generalized): Secondary | ICD-10-CM | POA: Diagnosis not present

## 2019-10-28 DIAGNOSIS — I4891 Unspecified atrial fibrillation: Secondary | ICD-10-CM | POA: Diagnosis not present

## 2019-10-28 DIAGNOSIS — R635 Abnormal weight gain: Secondary | ICD-10-CM | POA: Diagnosis not present

## 2019-10-28 DIAGNOSIS — I509 Heart failure, unspecified: Secondary | ICD-10-CM | POA: Diagnosis not present

## 2019-10-28 DIAGNOSIS — F419 Anxiety disorder, unspecified: Secondary | ICD-10-CM | POA: Diagnosis not present

## 2019-10-28 DIAGNOSIS — J449 Chronic obstructive pulmonary disease, unspecified: Secondary | ICD-10-CM | POA: Diagnosis not present

## 2019-10-29 DIAGNOSIS — S72301D Unspecified fracture of shaft of right femur, subsequent encounter for closed fracture with routine healing: Secondary | ICD-10-CM | POA: Diagnosis not present

## 2019-10-29 DIAGNOSIS — M6281 Muscle weakness (generalized): Secondary | ICD-10-CM | POA: Diagnosis not present

## 2019-10-29 DIAGNOSIS — R488 Other symbolic dysfunctions: Secondary | ICD-10-CM | POA: Diagnosis not present

## 2019-10-29 DIAGNOSIS — Z79899 Other long term (current) drug therapy: Secondary | ICD-10-CM | POA: Diagnosis not present

## 2019-10-30 DIAGNOSIS — M6281 Muscle weakness (generalized): Secondary | ICD-10-CM | POA: Diagnosis not present

## 2019-10-30 DIAGNOSIS — S72301D Unspecified fracture of shaft of right femur, subsequent encounter for closed fracture with routine healing: Secondary | ICD-10-CM | POA: Diagnosis not present

## 2019-10-30 NOTE — Telephone Encounter (Signed)
Agree  Thank you

## 2019-11-04 DIAGNOSIS — M6281 Muscle weakness (generalized): Secondary | ICD-10-CM | POA: Diagnosis not present

## 2019-11-04 DIAGNOSIS — Z79899 Other long term (current) drug therapy: Secondary | ICD-10-CM | POA: Diagnosis not present

## 2019-11-04 DIAGNOSIS — R488 Other symbolic dysfunctions: Secondary | ICD-10-CM | POA: Diagnosis not present

## 2019-11-05 DIAGNOSIS — S72301D Unspecified fracture of shaft of right femur, subsequent encounter for closed fracture with routine healing: Secondary | ICD-10-CM | POA: Diagnosis not present

## 2019-11-05 DIAGNOSIS — M6281 Muscle weakness (generalized): Secondary | ICD-10-CM | POA: Diagnosis not present

## 2019-11-06 DIAGNOSIS — S72301D Unspecified fracture of shaft of right femur, subsequent encounter for closed fracture with routine healing: Secondary | ICD-10-CM | POA: Diagnosis not present

## 2019-11-06 DIAGNOSIS — M6281 Muscle weakness (generalized): Secondary | ICD-10-CM | POA: Diagnosis not present

## 2019-11-06 DIAGNOSIS — R488 Other symbolic dysfunctions: Secondary | ICD-10-CM | POA: Diagnosis not present

## 2019-11-10 DIAGNOSIS — S72301D Unspecified fracture of shaft of right femur, subsequent encounter for closed fracture with routine healing: Secondary | ICD-10-CM | POA: Diagnosis not present

## 2019-11-10 DIAGNOSIS — M6281 Muscle weakness (generalized): Secondary | ICD-10-CM | POA: Diagnosis not present

## 2019-11-11 DIAGNOSIS — S72301D Unspecified fracture of shaft of right femur, subsequent encounter for closed fracture with routine healing: Secondary | ICD-10-CM | POA: Diagnosis not present

## 2019-11-11 DIAGNOSIS — M6281 Muscle weakness (generalized): Secondary | ICD-10-CM | POA: Diagnosis not present

## 2019-11-12 DIAGNOSIS — M6281 Muscle weakness (generalized): Secondary | ICD-10-CM | POA: Diagnosis not present

## 2019-11-12 DIAGNOSIS — R488 Other symbolic dysfunctions: Secondary | ICD-10-CM | POA: Diagnosis not present

## 2019-11-13 DIAGNOSIS — R488 Other symbolic dysfunctions: Secondary | ICD-10-CM | POA: Diagnosis not present

## 2019-11-13 DIAGNOSIS — M6281 Muscle weakness (generalized): Secondary | ICD-10-CM | POA: Diagnosis not present

## 2019-11-16 DIAGNOSIS — M6281 Muscle weakness (generalized): Secondary | ICD-10-CM | POA: Diagnosis not present

## 2019-11-16 DIAGNOSIS — R488 Other symbolic dysfunctions: Secondary | ICD-10-CM | POA: Diagnosis not present

## 2019-11-17 DIAGNOSIS — S72301D Unspecified fracture of shaft of right femur, subsequent encounter for closed fracture with routine healing: Secondary | ICD-10-CM | POA: Diagnosis not present

## 2019-11-17 DIAGNOSIS — L570 Actinic keratosis: Secondary | ICD-10-CM | POA: Diagnosis not present

## 2019-11-17 DIAGNOSIS — M6281 Muscle weakness (generalized): Secondary | ICD-10-CM | POA: Diagnosis not present

## 2019-11-17 DIAGNOSIS — L821 Other seborrheic keratosis: Secondary | ICD-10-CM | POA: Diagnosis not present

## 2019-11-17 DIAGNOSIS — L814 Other melanin hyperpigmentation: Secondary | ICD-10-CM | POA: Diagnosis not present

## 2019-11-17 DIAGNOSIS — Z85828 Personal history of other malignant neoplasm of skin: Secondary | ICD-10-CM | POA: Diagnosis not present

## 2019-11-17 DIAGNOSIS — D1801 Hemangioma of skin and subcutaneous tissue: Secondary | ICD-10-CM | POA: Diagnosis not present

## 2019-11-18 DIAGNOSIS — I4892 Unspecified atrial flutter: Secondary | ICD-10-CM | POA: Diagnosis not present

## 2019-11-18 DIAGNOSIS — F4489 Other dissociative and conversion disorders: Secondary | ICD-10-CM | POA: Diagnosis not present

## 2019-11-18 DIAGNOSIS — S81812S Laceration without foreign body, left lower leg, sequela: Secondary | ICD-10-CM | POA: Diagnosis not present

## 2019-11-19 DIAGNOSIS — S72301D Unspecified fracture of shaft of right femur, subsequent encounter for closed fracture with routine healing: Secondary | ICD-10-CM | POA: Diagnosis not present

## 2019-11-19 DIAGNOSIS — M6281 Muscle weakness (generalized): Secondary | ICD-10-CM | POA: Diagnosis not present

## 2019-11-19 DIAGNOSIS — R488 Other symbolic dysfunctions: Secondary | ICD-10-CM | POA: Diagnosis not present

## 2019-11-20 DIAGNOSIS — J449 Chronic obstructive pulmonary disease, unspecified: Secondary | ICD-10-CM | POA: Diagnosis not present

## 2019-11-20 DIAGNOSIS — S72301D Unspecified fracture of shaft of right femur, subsequent encounter for closed fracture with routine healing: Secondary | ICD-10-CM | POA: Diagnosis not present

## 2019-11-20 DIAGNOSIS — R0602 Shortness of breath: Secondary | ICD-10-CM | POA: Diagnosis not present

## 2019-11-20 DIAGNOSIS — M6281 Muscle weakness (generalized): Secondary | ICD-10-CM | POA: Diagnosis not present

## 2019-11-20 DIAGNOSIS — R488 Other symbolic dysfunctions: Secondary | ICD-10-CM | POA: Diagnosis not present

## 2019-11-24 DIAGNOSIS — M6281 Muscle weakness (generalized): Secondary | ICD-10-CM | POA: Diagnosis not present

## 2019-11-24 DIAGNOSIS — S72301D Unspecified fracture of shaft of right femur, subsequent encounter for closed fracture with routine healing: Secondary | ICD-10-CM | POA: Diagnosis not present

## 2019-11-25 DIAGNOSIS — M6281 Muscle weakness (generalized): Secondary | ICD-10-CM | POA: Diagnosis not present

## 2019-11-25 DIAGNOSIS — R05 Cough: Secondary | ICD-10-CM | POA: Diagnosis not present

## 2019-11-25 DIAGNOSIS — I10 Essential (primary) hypertension: Secondary | ICD-10-CM | POA: Diagnosis not present

## 2019-11-25 DIAGNOSIS — M810 Age-related osteoporosis without current pathological fracture: Secondary | ICD-10-CM | POA: Diagnosis not present

## 2019-11-25 DIAGNOSIS — R488 Other symbolic dysfunctions: Secondary | ICD-10-CM | POA: Diagnosis not present

## 2019-11-25 DIAGNOSIS — R600 Localized edema: Secondary | ICD-10-CM | POA: Diagnosis not present

## 2019-11-26 DIAGNOSIS — M6281 Muscle weakness (generalized): Secondary | ICD-10-CM | POA: Diagnosis not present

## 2019-11-26 DIAGNOSIS — S72301D Unspecified fracture of shaft of right femur, subsequent encounter for closed fracture with routine healing: Secondary | ICD-10-CM | POA: Diagnosis not present

## 2019-11-27 ENCOUNTER — Inpatient Hospital Stay (HOSPITAL_COMMUNITY)
Admission: EM | Admit: 2019-11-27 | Discharge: 2019-12-03 | DRG: 291 | Disposition: A | Discharge status: Skilled Nursing Facility | Payer: Medicare PPO | Source: Skilled Nursing Facility | Attending: Internal Medicine | Admitting: Internal Medicine

## 2019-11-27 ENCOUNTER — Telehealth: Payer: Self-pay | Admitting: Cardiovascular Disease

## 2019-11-27 ENCOUNTER — Emergency Department (HOSPITAL_COMMUNITY): Payer: Medicare PPO

## 2019-11-27 ENCOUNTER — Other Ambulatory Visit: Payer: Self-pay

## 2019-11-27 DIAGNOSIS — F419 Anxiety disorder, unspecified: Secondary | ICD-10-CM | POA: Diagnosis present

## 2019-11-27 DIAGNOSIS — S728X1D Other fracture of right femur, subsequent encounter for closed fracture with routine healing: Secondary | ICD-10-CM | POA: Diagnosis not present

## 2019-11-27 DIAGNOSIS — L309 Dermatitis, unspecified: Secondary | ICD-10-CM | POA: Diagnosis present

## 2019-11-27 DIAGNOSIS — F0390 Unspecified dementia without behavioral disturbance: Secondary | ICD-10-CM | POA: Diagnosis present

## 2019-11-27 DIAGNOSIS — I110 Hypertensive heart disease with heart failure: Principal | ICD-10-CM | POA: Diagnosis present

## 2019-11-27 DIAGNOSIS — I4891 Unspecified atrial fibrillation: Secondary | ICD-10-CM

## 2019-11-27 DIAGNOSIS — M47812 Spondylosis without myelopathy or radiculopathy, cervical region: Secondary | ICD-10-CM | POA: Diagnosis present

## 2019-11-27 DIAGNOSIS — S7290XA Unspecified fracture of unspecified femur, initial encounter for closed fracture: Secondary | ICD-10-CM | POA: Diagnosis present

## 2019-11-27 DIAGNOSIS — I5043 Acute on chronic combined systolic (congestive) and diastolic (congestive) heart failure: Secondary | ICD-10-CM | POA: Diagnosis present

## 2019-11-27 DIAGNOSIS — Z20822 Contact with and (suspected) exposure to covid-19: Secondary | ICD-10-CM | POA: Diagnosis present

## 2019-11-27 DIAGNOSIS — R001 Bradycardia, unspecified: Secondary | ICD-10-CM | POA: Diagnosis not present

## 2019-11-27 DIAGNOSIS — Z823 Family history of stroke: Secondary | ICD-10-CM

## 2019-11-27 DIAGNOSIS — Z79899 Other long term (current) drug therapy: Secondary | ICD-10-CM

## 2019-11-27 DIAGNOSIS — F32A Depression, unspecified: Secondary | ICD-10-CM | POA: Diagnosis present

## 2019-11-27 DIAGNOSIS — Z8673 Personal history of transient ischemic attack (TIA), and cerebral infarction without residual deficits: Secondary | ICD-10-CM

## 2019-11-27 DIAGNOSIS — I442 Atrioventricular block, complete: Secondary | ICD-10-CM | POA: Diagnosis present

## 2019-11-27 DIAGNOSIS — N179 Acute kidney failure, unspecified: Secondary | ICD-10-CM | POA: Diagnosis present

## 2019-11-27 DIAGNOSIS — E785 Hyperlipidemia, unspecified: Secondary | ICD-10-CM | POA: Diagnosis present

## 2019-11-27 DIAGNOSIS — Z881 Allergy status to other antibiotic agents status: Secondary | ICD-10-CM

## 2019-11-27 DIAGNOSIS — I4819 Other persistent atrial fibrillation: Secondary | ICD-10-CM | POA: Diagnosis present

## 2019-11-27 DIAGNOSIS — Z66 Do not resuscitate: Secondary | ICD-10-CM | POA: Diagnosis present

## 2019-11-27 DIAGNOSIS — I10 Essential (primary) hypertension: Secondary | ICD-10-CM | POA: Diagnosis present

## 2019-11-27 DIAGNOSIS — Z96643 Presence of artificial hip joint, bilateral: Secondary | ICD-10-CM | POA: Diagnosis present

## 2019-11-27 DIAGNOSIS — F259 Schizoaffective disorder, unspecified: Secondary | ICD-10-CM | POA: Diagnosis present

## 2019-11-27 DIAGNOSIS — Z8781 Personal history of (healed) traumatic fracture: Secondary | ICD-10-CM

## 2019-11-27 DIAGNOSIS — J449 Chronic obstructive pulmonary disease, unspecified: Secondary | ICD-10-CM | POA: Diagnosis present

## 2019-11-27 DIAGNOSIS — R0902 Hypoxemia: Secondary | ICD-10-CM

## 2019-11-27 DIAGNOSIS — Z7901 Long term (current) use of anticoagulants: Secondary | ICD-10-CM

## 2019-11-27 DIAGNOSIS — M810 Age-related osteoporosis without current pathological fracture: Secondary | ICD-10-CM | POA: Diagnosis present

## 2019-11-27 DIAGNOSIS — I4892 Unspecified atrial flutter: Secondary | ICD-10-CM | POA: Diagnosis present

## 2019-11-27 DIAGNOSIS — S7291XD Unspecified fracture of right femur, subsequent encounter for closed fracture with routine healing: Secondary | ICD-10-CM

## 2019-11-27 DIAGNOSIS — W19XXXD Unspecified fall, subsequent encounter: Secondary | ICD-10-CM | POA: Diagnosis present

## 2019-11-27 DIAGNOSIS — Z7401 Bed confinement status: Secondary | ICD-10-CM

## 2019-11-27 DIAGNOSIS — Z8249 Family history of ischemic heart disease and other diseases of the circulatory system: Secondary | ICD-10-CM

## 2019-11-27 DIAGNOSIS — T462X5A Adverse effect of other antidysrhythmic drugs, initial encounter: Secondary | ICD-10-CM | POA: Diagnosis present

## 2019-11-27 DIAGNOSIS — J9601 Acute respiratory failure with hypoxia: Secondary | ICD-10-CM | POA: Diagnosis not present

## 2019-11-27 LAB — CBC WITH DIFFERENTIAL/PLATELET
Abs Immature Granulocytes: 0.09 10*3/uL — ABNORMAL HIGH (ref 0.00–0.07)
Basophils Absolute: 0.1 10*3/uL (ref 0.0–0.1)
Basophils Relative: 1 %
Eosinophils Absolute: 0.1 10*3/uL (ref 0.0–0.5)
Eosinophils Relative: 1 %
HCT: 33.3 % — ABNORMAL LOW (ref 36.0–46.0)
Hemoglobin: 10.8 g/dL — ABNORMAL LOW (ref 12.0–15.0)
Immature Granulocytes: 1 %
Lymphocytes Relative: 20 %
Lymphs Abs: 1.9 10*3/uL (ref 0.7–4.0)
MCH: 31.7 pg (ref 26.0–34.0)
MCHC: 32.4 g/dL (ref 30.0–36.0)
MCV: 97.7 fL (ref 80.0–100.0)
Monocytes Absolute: 1 10*3/uL (ref 0.1–1.0)
Monocytes Relative: 11 %
Neutro Abs: 6 10*3/uL (ref 1.7–7.7)
Neutrophils Relative %: 66 %
Platelets: 232 10*3/uL (ref 150–400)
RBC: 3.41 MIL/uL — ABNORMAL LOW (ref 3.87–5.11)
RDW: 16 % — ABNORMAL HIGH (ref 11.5–15.5)
WBC: 9.1 10*3/uL (ref 4.0–10.5)
nRBC: 0 % (ref 0.0–0.2)

## 2019-11-27 LAB — TROPONIN I (HIGH SENSITIVITY)
Troponin I (High Sensitivity): 17 ng/L (ref <18)
Troponin I (High Sensitivity): 17 ng/L (ref <18)

## 2019-11-27 LAB — RESPIRATORY PANEL BY RT PCR (FLU A&B, COVID)
Influenza A by PCR: NEGATIVE
Influenza B by PCR: NEGATIVE
SARS Coronavirus 2 by RT PCR: NEGATIVE

## 2019-11-27 LAB — RETICULOCYTES
Immature Retic Fract: 10.2 % (ref 2.3–15.9)
RBC.: 3.44 MIL/uL — ABNORMAL LOW (ref 3.87–5.11)
Retic Count, Absolute: 39.6 10*3/uL (ref 19.0–186.0)
Retic Ct Pct: 1.2 % (ref 0.4–3.1)

## 2019-11-27 LAB — TYPE AND SCREEN
ABO/RH(D): O POS
Antibody Screen: NEGATIVE

## 2019-11-27 LAB — BASIC METABOLIC PANEL
Anion gap: 11 (ref 5–15)
BUN: 31 mg/dL — ABNORMAL HIGH (ref 8–23)
CO2: 26 mmol/L (ref 22–32)
Calcium: 8.3 mg/dL — ABNORMAL LOW (ref 8.9–10.3)
Chloride: 101 mmol/L (ref 98–111)
Creatinine, Ser: 3.02 mg/dL — ABNORMAL HIGH (ref 0.44–1.00)
GFR calc Af Amer: 15 mL/min — ABNORMAL LOW (ref >60)
GFR calc non Af Amer: 13 mL/min — ABNORMAL LOW (ref >60)
Glucose, Bld: 103 mg/dL — ABNORMAL HIGH (ref 70–99)
Potassium: 3.6 mmol/L (ref 3.5–5.1)
Sodium: 138 mmol/L (ref 135–145)

## 2019-11-27 LAB — FOLATE: Folate: 55.5 ng/mL (ref >5.9)

## 2019-11-27 LAB — IRON AND TIBC
Iron: 42 ug/dL (ref 28–170)
Saturation Ratios: 13 % (ref 10.4–31.8)
TIBC: 316 ug/dL (ref 250–450)
UIBC: 274 ug/dL

## 2019-11-27 LAB — VITAMIN B12: Vitamin B-12: 483 pg/mL (ref 180–914)

## 2019-11-27 LAB — FERRITIN: Ferritin: 102 ng/mL (ref 11–307)

## 2019-11-27 LAB — T4, FREE: Free T4: 0.97 ng/dL (ref 0.61–1.12)

## 2019-11-27 LAB — TSH: TSH: 14.929 u[IU]/mL — ABNORMAL HIGH (ref 0.350–4.500)

## 2019-11-27 LAB — MAGNESIUM: Magnesium: 1.9 mg/dL (ref 1.7–2.4)

## 2019-11-27 LAB — GLUCOSE, CAPILLARY: Glucose-Capillary: 131 mg/dL — ABNORMAL HIGH (ref 70–99)

## 2019-11-27 MED ORDER — CHLORHEXIDINE GLUCONATE CLOTH 2 % EX PADS
6.0000 | MEDICATED_PAD | Freq: Every day | CUTANEOUS | Status: DC
  Administered 2019-11-28 – 2019-12-02 (×5): 6 via TOPICAL

## 2019-11-27 MED ORDER — POLYETHYL GLYCOL-PROPYL GLYCOL 0.4-0.3 % OP SOLN: 1.0000 [drp] | OPHTHALMIC | Status: DC | PRN

## 2019-11-27 MED ORDER — ISOPROTERENOL HCL 0.2 MG/ML IJ SOLN
2.0000 ug/min | INTRAVENOUS | Status: DC
  Administered 2019-11-27: 2 ug/min via INTRAVENOUS
  Administered 2019-11-28: 5 ug/min via INTRAVENOUS
  Administered 2019-11-28: 3 ug/min via INTRAVENOUS
  Administered 2019-11-28: 4 ug/min via INTRAVENOUS
  Administered 2019-11-28: 5 ug/min via INTRAVENOUS
  Filled 2019-11-27 (×6): qty 5

## 2019-11-27 MED ORDER — GALANTAMINE HYDROBROMIDE ER 8 MG PO CP24
8.0000 mg | ORAL_CAPSULE | Freq: Every day | ORAL | Status: DC
  Filled 2019-11-27: qty 1

## 2019-11-27 MED ORDER — PSYLLIUM 95 % PO PACK
1.0000 | PACK | Freq: Every day | ORAL | Status: DC
  Administered 2019-12-02 – 2019-12-03 (×2): 1 via ORAL
  Filled 2019-11-27 (×7): qty 1

## 2019-11-27 MED ORDER — FENTANYL CITRATE (PF) 100 MCG/2ML IJ SOLN: 25.0000 ug | Freq: Once | INTRAMUSCULAR | Status: DC

## 2019-11-27 MED ORDER — ZIPRASIDONE HCL 20 MG PO CAPS
20.0000 mg | ORAL_CAPSULE | Freq: Every day | ORAL | Status: DC
  Administered 2019-11-30: 20 mg via ORAL
  Filled 2019-11-27 (×6): qty 1

## 2019-11-27 MED ORDER — APIXABAN 2.5 MG PO TABS
2.5000 mg | ORAL_TABLET | Freq: Two times a day (BID) | ORAL | Status: DC
  Administered 2019-11-27 – 2019-12-03 (×12): 2.5 mg via ORAL
  Filled 2019-11-27 (×13): qty 1

## 2019-11-27 MED ORDER — SODIUM CHLORIDE 0.9 % IV SOLN: INTRAVENOUS | Status: DC

## 2019-11-27 MED ORDER — PSYLLIUM 0.52 G PO CAPS: 0.5200 g | ORAL_CAPSULE | Freq: Every day | ORAL | Status: DC

## 2019-11-27 MED ORDER — POLYVINYL ALCOHOL 1.4 % OP SOLN
1.0000 [drp] | OPHTHALMIC | Status: DC | PRN
  Filled 2019-11-27: qty 15

## 2019-11-27 MED ORDER — IPRATROPIUM-ALBUTEROL 0.5-2.5 (3) MG/3ML IN SOLN: 3.0000 mL | Freq: Four times a day (QID) | RESPIRATORY_TRACT | Status: DC | PRN

## 2019-11-27 MED ORDER — SODIUM CHLORIDE 0.9 % IV BOLUS
500.0000 mL | Freq: Once | INTRAVENOUS | Status: AC
  Administered 2019-11-27: 500 mL via INTRAVENOUS

## 2019-11-27 MED ORDER — POTASSIUM CHLORIDE CRYS ER 20 MEQ PO TBCR: 20.0000 meq | EXTENDED_RELEASE_TABLET | ORAL | Status: DC

## 2019-11-27 MED ORDER — CITALOPRAM HYDROBROMIDE 20 MG PO TABS
20.0000 mg | ORAL_TABLET | Freq: Every day | ORAL | Status: DC
  Administered 2019-11-28 – 2019-12-03 (×6): 20 mg via ORAL
  Filled 2019-11-27 (×6): qty 1

## 2019-11-27 NOTE — Telephone Encounter (Signed)
Candy, Daughter of the patient wanted to let Dr. Duke Salvia know that the patient is in Afib and currently being transported to [facility] hospital by EMS

## 2019-11-27 NOTE — ED Notes (Signed)
Pt is unable to go to 6E bc pt is being externally paced on zoll.

## 2019-11-27 NOTE — Progress Notes (Signed)
Dr. Nicole Cella present and gave order to titrate isuprel to maintain heart rate of 60.

## 2019-11-27 NOTE — ED Triage Notes (Signed)
Pt here with ems with reoccurring episodes of afib. Ems reports hr as low as 30s-180s. VSS at this time.  Pt was in hospital last week and last month for the same.

## 2019-11-27 NOTE — H&P (Signed)
History and Physical    Yolanda Baker WYS:[id number] DOB: 1923-09-02 DOA: 11/27/2019  PCP: Rodrigo Ran, MD    Patient coming from:  [facility]  Chief Complaint:  Generalized weakness   HPI: Yolanda Baker is a 86 y.o. female with medical history significant of COPD, atrial fibrillation on Eliquis, with a history of stroke, chronic diastolic congestive heart failure. Patient seen today for generalized weakness brought from facility after feeling tired and found to have low heart rate by physical therapist.  Daughter at bedside [name] she  also wants me to order an x-ray of her right hip for follow-up for healing per orthopedic recommendation.  Patient today is alert awake oriented cooperative and facility is essentially bedbound and needs total assist. Daughter is [name] phone numbers [phone]. ED Course: Blood pressure (!) 121/53, pulse (!) 143, temperature 98.9 F (37.2 C), temperature source Oral, resp. rate 14, SpO2 97 %. Pt  Given 1/2 ns 500 ml bolus. cardiology consulted by edmd with rec for stopping metoprolol and amiodarone.  Review of Systems: As per HPI otherwise all systems reviewed and negative.  Past Medical History:  Diagnosis Date  . Anemia   . Anxiety   . Arthritis    neck  . Bruises easily   . Chronic diastolic CHF (congestive heart failure) (HCC)   . COPD (chronic obstructive pulmonary disease) (HCC)   . Dementia (HCC)   . Depression   . Dysrhythmia    hx A-FLUTTER after hip surg April 2015- take metoprolol  . Eczema   . Falls   . Hip fracture (HCC)   . Hip joint pain    RT  . Hypertension   . Osteoporosis   . Persistent atrial fibrillation (HCC) 08/04/2019  . Pneumonia    august 2017  . Stroke [facility])     Past Surgical History:  Procedure Laterality Date  . ABDOMINAL HYSTERECTOMY    . FRACTURE SURGERY Right 06/05/2013   hip  . HIP ARTHROPLASTY Right 10/03/2013   Procedure: CONVERSION OF PREVIOUS RIGHT HIP SURGERY TO  A RIGHT TOTAL HIP ARTHROPLASTY;  Surgeon: Loanne Drilling, MD;  Location: [facility];  Service: Orthopedics;  Laterality: Right;  . INTRAMEDULLARY (IM) NAIL INTERTROCHANTERIC Right 06/05/2013   Procedure: INTRAMEDULLARY (IM) NAIL INTERTROCHANTRIC;  Surgeon: Loanne Drilling, MD;  Location: [facility];  Service: Orthopedics;  Laterality: Right;  . JOINT REPLACEMENT     left hip  . left hip Left 2008  . LEG SURGERY Right    x2     reports that she has never smoked. She has never used smokeless tobacco. She reports that she does not drink alcohol and does not use drugs.  Allergies  Allergen Reactions  . Flagyl [Metronidazole] Other (See Comments)    Fingers get "numb"    Family History  Problem Relation Age of Onset  . Heart attack Father   . Hypertension Father   . Hypertension Sister   . Stroke Sister   . Hypertension Brother     Prior to Admission medications   Medication Sig Start Date End Date Taking? Authorizing Provider  acetaminophen (TYLENOL) 500 MG tablet Take 1 tablet (500 mg total) by mouth 2 (two) times daily as needed for mild pain or moderate pain. 03/02/15   Leta Baptist, MD  amiodarone (PACERONE) 200 MG tablet Take 1 tablet (200 mg total) by mouth 2 (two) times daily. 09/15/19   Swayze, Ava, DO  amLODipine (NORVASC) 2.5 MG tablet Take  1 tablet (2.5 mg total) by mouth daily. 10/20/19   Mikhail, Nita Sells, DO  apixaban (ELIQUIS) 2.5 MG TABS tablet Take 1 tablet (2.5 mg total) by mouth 2 (two) times daily. 07/01/14   Layne Benton, NP  chlorhexidine (PERIDEX) 0.12 % solution Use as directed 15 mLs in the mouth or throat 2 (two) times daily. Swish and spit    [provider]  Chlorpheniramine-APAP (CORICIDIN) 2-325 MG TABS Take 1 tablet by mouth 2 (two) times daily as needed (congestion).    [provider]  citalopram (CELEXA) 20 MG tablet Take 1 tablet (20 mg total) by mouth daily. 09/15/19   Swayze, Ava, DO  furosemide (LASIX) 20 MG tablet Take 1 tablet (20 mg  total) by mouth daily. 10/20/19   Edsel Petrin, DO  galantamine (RAZADYNE ER) 8 MG 24 hr capsule Take 8 mg by mouth daily with breakfast.    [provider]  HYDROcodone-acetaminophen (NORCO/VICODIN) 5-325 MG tablet Take 1-2 tablets by mouth every 6 (six) hours as needed for moderate pain. 10/20/19   Edsel Petrin, DO  loperamide (IMODIUM) 2 MG capsule Take 2 mg by mouth as needed for diarrhea or loose stools.  07/14/19   [provider]  LORazepam (ATIVAN) 0.5 MG tablet Take 1 tablet (0.5 mg total) by mouth 2 (two) times daily. 10/20/19   Mikhail, Nita Sells, DO  Metoprolol Tartrate 75 MG TABS Take 75 mg by mouth 2 (two) times daily. 09/15/19   Swayze, Ava, DO  Multiple Vitamins-Minerals (CERTAVITE SENIOR/ANTIOXIDANT PO) Take 1 tablet by mouth daily.     [provider]  Polyethyl Glycol-Propyl Glycol (SYSTANE) 0.4-0.3 % SOLN Place 1 drop into both eyes every hour as needed (dry eyes/irritation).    [provider]  potassium chloride SA (K-DUR,KLOR-CON) 20 MEQ tablet Take 20 mEq by mouth every other day.    [provider]  psyllium (REGULOID) 0.52 g capsule Take 0.52 g by mouth daily.    [provider]  simvastatin (ZOCOR) 10 MG tablet Take 1 tablet (10 mg total) by mouth daily at 6 PM. 07/01/14   Layne Benton, NP  ziprasidone (GEODON) 20 MG capsule TAKE ONE CAPSULE BY MOUTH DAILY IN THE AFTERNOON **DO NOT CRUSH** **NO REFILLS** Patient taking differently: Take 20 mg by mouth daily.  09/10/19   Cottle, Steva Ready., MD    Physical Exam: Vitals:   11/27/19 1530 11/27/19 1600 11/27/19 1615 11/27/19 1630  BP: (!) 107/49 (!) 117/33 (!) 113/52 (!) 121/53  Pulse: (!) 56 (!) 32 (!) 32 (!) 143  Resp: (!) 21 14 16 14   Temp:      TempSrc:      SpO2: 100% 98% 97% 97%    Constitutional: NAD, calm, comfortable Vitals:   11/27/19 1530 11/27/19 1600 11/27/19 1615 11/27/19 1630  BP: (!) 107/49 (!) 117/33 (!) 113/52 (!) 121/53  Pulse: (!) 56 (!) 32  (!) 32 (!) 143  Resp: (!) 21 14 16 14   Temp:      TempSrc:      SpO2: 100% 98% 97% 97%   Eyes: PERRL, EOMI lids and conjunctivae normal ENMT: Mucous membranes are moist. Posterior pharynx clear of any exudate or lesions.Normal dentition.  Neck: normal, supple, no masses, no thyromegaly, no carotid bruit  Respiratory: clear to auscultation bilaterally, no wheezing, no crackles. Normal respiratory effort. No accessory muscle use.  Cardiovascular: Irregular rate and bradycardia, no murmurs / rubs / gallops. No extremity edema. 2+ pedal pulses.  Abdomen: no  tenderness, no masses palpated. No hepatosplenomegaly. Bowel sounds positive.  Musculoskeletal: no clubbing / cyanosis. No joint deformity upper and lower extremities. Pt moving all four ext , no contractures. Normal muscle tone.  Skin: no rashes, lesions, ulcers. No induration Neurologic: CN 2-12 grossly intact.  Psychiatric: Normal judgment and insight. Alert and oriented x 3. Normal mood.   Labs on Admission: I have personally reviewed following labs and imaging studies  CBC: Recent Labs  Lab 11/27/19 1341  WBC 9.1  NEUTROABS 6.0  HGB 10.8*  HCT 33.3*  MCV 97.7  PLT 232   Basic Metabolic Panel: Recent Labs  Lab 11/27/19 1341  NA 138  K 3.6  CL 101  CO2 26  GLUCOSE 103*  BUN 31*  CREATININE 3.02*  CALCIUM 8.3*  MG 1.9   GFR: CrCl cannot be calculated (Unknown ideal weight.). Liver Function Tests: No results for input(s): AST, ALT, ALKPHOS, BILITOT, PROT, ALBUMIN in the last 168 hours. No results for input(s): LIPASE, AMYLASE in the last 168 hours. No results for input(s): AMMONIA in the last 168 hours. Coagulation Profile: No results for input(s): INR, PROTIME in the last 168 hours. Cardiac Enzymes: No results for input(s): CKTOTAL, CKMB, CKMBINDEX, TROPONINI in the last 168 hours. BNP (last 3 results) No results for input(s): PROBNP in the last 8760 hours. HbA1C: No results for input(s): HGBA1C in the last  72 hours. CBG: No results for input(s): GLUCAP in the last 168 hours. Lipid Profile: No results for input(s): CHOL, HDL, LDLCALC, TRIG, CHOLHDL, LDLDIRECT in the last 72 hours. Thyroid Function Tests: No results for input(s): TSH, T4TOTAL, FREET4, T3FREE, THYROIDAB in the last 72 hours. Anemia Panel: No results for input(s): VITAMINB12, FOLATE, FERRITIN, TIBC, IRON, RETICCTPCT in the last 72 hours. Urine analysis:    Component Value Date/Time   COLORURINE YELLOW 10/16/2019 1754   APPEARANCEUR CLEAR 10/16/2019 1754   LABSPEC 1.019 10/16/2019 1754   PHURINE 7.0 10/16/2019 1754   GLUCOSEU NEGATIVE 10/16/2019 1754   HGBUR NEGATIVE 10/16/2019 1754   BILIRUBINUR NEGATIVE 10/16/2019 1754   KETONESUR NEGATIVE 10/16/2019 1754   PROTEINUR NEGATIVE 10/16/2019 1754   UROBILINOGEN 0.2 06/29/2014 0943   NITRITE NEGATIVE 10/16/2019 1754   LEUKOCYTESUR NEGATIVE 10/16/2019 1754    Intake/Output Summary (Last 24 hours) at 11/27/2019 1654 Last data filed at 11/27/2019 1544 Gross per 24 hour  Intake 500 ml  Output --  Net 500 ml   Lab Results  Component Value Date   CREATININE 3.02 (H) 11/27/2019   CREATININE 1.02 (H) 10/20/2019   CREATININE 0.99 10/19/2019    COVID-19 Labs  No results for input(s): DDIMER, FERRITIN, LDH, CRP in the last 72 hours.  Lab Results  Component Value Date   SARSCOV2NAA NEGATIVE 11/27/2019   SARSCOV2NAA NEGATIVE 10/20/2019   SARSCOV2NAA NEGATIVE 10/16/2019   SARSCOV2NAA NEGATIVE 09/10/2019    Radiological Exams on Admission: DG Chest Portable 1 View  Result Date: 11/27/2019 CLINICAL DATA:  Shortness of breath.  Atrial fibrillation EXAM: PORTABLE CHEST 1 VIEW COMPARISON:  October 16, 2019 chest radiograph and CT angiogram chest FINDINGS: Lungs are clear. There is cardiomegaly with pulmonary vascularity normal. No adenopathy. No pneumothorax. No bone lesions. IMPRESSION: Cardiomegaly.  Lungs clear. Electronically Signed   By: Bretta Bang III M.D.   On:  11/27/2019 13:39    EKG: Independently reviewed.  Atrial fib at 54 however telemetry is 28-40's.  Assessment/Plan Active Problems:   Essential hypertension   Atrial flutter (HCC)   Closed femur fracture (HCC)  DNR (do not resuscitate) present on admission   Bradycardia   Bradycardia/ A.fib: -Pt on pacer right now in ed with HR in 40-50's, d/d include med related brady or SSS, or thyroid abnormalities due to amiodarone.  -continue eliquis.  -cardiology consult per primary MD team.  -Current plan to stop AV blocking agents and evaluate for thyroid studies for treatable causes.  -if no medical reason identified or therapy and pt needs additional intervention she is DNR and to d/w pt And daughter if they would like to pursue.   Closed rt femur fracture: Pt had rt hip fracture in march f/u xray perpt request,.   DNR: D/W daughter that DNR does not mean do not treat and we are at the moment addressing her heart rate in medical conservative t/t plan.    DVT prophylaxis:  Pt is on Eliquis.   Code Status:  DNR  Family Communication:  Yolanda Baker daughter-[phone].  Disposition Plan:  Skilled nursing facility.  Consults called:  Cardiology called by EDMD Dr.Oneal.  Admission status: Observation  Status is: Observation  The patient remains OBS appropriate and will d/c before 2 midnights.  Dispo: The patient is from: SNF              Anticipated d/c is to: SNF              Anticipated d/c date is: 1 day              Patient currently is not medically stable to d/c.   Gertha Calkin MD [facility] Pager [phone] If 7PM-7AM, please contact night-coverage [email or web address] Password [facility] 11/27/2019, 4:54 PM

## 2019-11-27 NOTE — ED Notes (Signed)
Pt given diet coke and [place] sandwich.

## 2019-11-27 NOTE — Significant Event (Addendum)
Rapid Response Event Note   Reason for Call :  Called to evaluate pt that just arrived to 2C15(PCU) while being externally paced.   Pt came to ED with generalized weakness and bradycardia(HR-20s). Pt's amiodarone, metoprolol held, and external pacing initiated in ED.  Initial Focused Assessment:  Pt laying in the bed, twitching d/t external pacer. Pt is alert and oriented to self and place. Pt denies pain at this time however, she told the RN prior to my arrival that her pain was 4/10. Lungs clear, diminished in the bases. Skin warm and dry.   External pacer set at 62 mA and 54 PPM.  T-99.2, HR-54, BP-138/59, RR-16, SpO2-94% on RA.    Interventions:  PCCM consulted-Dr. Nicole Cella to bedside.   Isuprel gtt ordered Will tx to [facility] ICU for tranvenous pacer insertion.   Plan of Care:  Tx to 2H25   Event Summary:   MD Notified: Dr. Rachael Darby notified at 2134. Call Time:2115 Arrival [phone] End Time:2230  Terrilyn Saver, RN

## 2019-11-27 NOTE — Progress Notes (Signed)
Pt transferred to 2H due to continuous pacing via [place].

## 2019-11-27 NOTE — Progress Notes (Signed)
Patient came from [facility]. Being externally paced at this time. Dr. Nicole Cella at the bedside and isuprel was just started. MD gave heart rate parameters for isuprel.

## 2019-11-27 NOTE — ED Provider Notes (Signed)
[facility] EMERGENCY DEPARTMENT Provider Note   CSN: [id number] Arrival date & time: 11/27/19  1207     History Chief Complaint  Patient presents with  . Atrial Fibrillation    Yolanda Baker is a 86 y.o. female.  Presenting to ER with concern for generalized weakness.  Patient has had decreased energy, general weakness over the last couple days.  She has noted that she has been peeing slightly less than normal but denies any significant dietary changes.  She denies any chest pain, no breathing, no passing out, no lightheadedness.  Has history of heart failure, EF 45 to 50%.  History of atrial fibrillation on metoprolol and amiodarone.  HPI     Past Medical History:  Diagnosis Date  . Anemia   . Anxiety   . Arthritis    neck  . Bruises easily   . Chronic diastolic CHF (congestive heart failure) (HCC)   . COPD (chronic obstructive pulmonary disease) (HCC)   . Dementia (HCC)   . Depression   . Dysrhythmia    hx A-FLUTTER after hip surg April 2015- take metoprolol  . Eczema   . Falls   . Hip fracture (HCC)   . Hip joint pain    RT  . Hypertension   . Osteoporosis   . Persistent atrial fibrillation (HCC) 08/04/2019  . Pneumonia    august 2017  . Stroke [facility])     Patient Active Problem List   Diagnosis Date Noted  . Bradycardia 11/27/2019  . Hypoxia   . Acute respiratory failure with hypoxia (HCC) 10/16/2019  . Persistent atrial fibrillation (HCC) 09/25/2019  . Secondary hypercoagulable state (HCC) 09/25/2019  . Atrial fibrillation with RVR (HCC) 09/11/2019  . PAF (paroxysmal atrial fibrillation) (HCC) 08/04/2019  . Goals of care, counseling/discussion   . Palliative care by specialist   . Pressure injury of skin 05/07/2019  . Periprosthetic fracture around internal prosthetic hip joint 05/06/2019  . DNR (do not resuscitate) present on admission 02/04/2019  . Acute on chronic diastolic heart failure (HCC)   . GAD (generalized anxiety  disorder) 01/15/2018  . Schizoaffective disorder (HCC) 01/15/2018  . Hyperlipidemia LDL goal <70 07/01/2014  . Constipation chronic 07/01/2014  . Acute CVA (cerebrovascular accident) (HCC)   . Acute blood loss anemia 10/12/2013  . Postoperative anemia due to acute blood loss 10/04/2013  . Postop Transfusion 10/04/2013  . Closed fracture of femur with nonunion 10/03/2013  . Femur fracture, right (HCC) 10/03/2013  . Closed femur fracture (HCC) 10/03/2013  . UTI (urinary tract infection) 06/23/2013  . Dementia (HCC) 06/09/2013  . Anxiety 06/09/2013  . Atrial flutter (HCC) 06/07/2013  . Hip fracture (HCC) 06/04/2013  . Depression 06/04/2013  . Anxiety state 06/04/2013  . Essential hypertension 06/04/2013  . Closed right hip fracture (HCC) 06/04/2013    Past Surgical History:  Procedure Laterality Date  . ABDOMINAL HYSTERECTOMY    . FRACTURE SURGERY Right 06/05/2013   hip  . HIP ARTHROPLASTY Right 10/03/2013   Procedure: CONVERSION OF PREVIOUS RIGHT HIP SURGERY TO A RIGHT TOTAL HIP ARTHROPLASTY;  Surgeon: Loanne Drilling, MD;  Location: [facility] ORS;  Service: Orthopedics;  Laterality: Right;  . INTRAMEDULLARY (IM) NAIL INTERTROCHANTERIC Right 06/05/2013   Procedure: INTRAMEDULLARY (IM) NAIL INTERTROCHANTRIC;  Surgeon: Loanne Drilling, MD;  Location: [facility] ORS;  Service: Orthopedics;  Laterality: Right;  . JOINT REPLACEMENT     left hip  . left hip Left 2008  . LEG SURGERY Right  x2     OB History   No obstetric history on file.     Family History  Problem Relation Age of Onset  . Heart attack Father   . Hypertension Father   . Hypertension Sister   . Stroke Sister   . Hypertension Brother     Social History   Tobacco Use  . Smoking status: Never Smoker  . Smokeless tobacco: Never Used  Vaping Use  . Vaping Use: Never used  Substance Use Topics  . Alcohol use: No    Alcohol/week: 0.0 standard drinks  . Drug use: No    Home Medications Prior to Admission medications    Medication Sig Start Date End Date Taking? Authorizing Provider  acetaminophen (TYLENOL) 500 MG tablet Take 1 tablet (500 mg total) by mouth 2 (two) times daily as needed for mild pain or moderate pain. 03/02/15  Yes Leta Baptist, MD  amiodarone (PACERONE) 200 MG tablet Take 1 tablet (200 mg total) by mouth 2 (two) times daily. 09/15/19  Yes Swayze, Ava, DO  amLODipine (NORVASC) 2.5 MG tablet Take 1 tablet (2.5 mg total) by mouth daily. 10/20/19  Yes Mikhail, Nita Sells, DO  apixaban (ELIQUIS) 2.5 MG TABS tablet Take 1 tablet (2.5 mg total) by mouth 2 (two) times daily. 07/01/14  Yes Layne Benton, NP  chlorhexidine (PERIDEX) 0.12 % solution Use as directed 15 mLs in the mouth or throat 2 (two) times daily. Swish and spit   Yes [provider]  citalopram (CELEXA) 20 MG tablet Take 1 tablet (20 mg total) by mouth daily. 09/15/19  Yes Swayze, Ava, DO  furosemide (LASIX) 20 MG tablet Take 1 tablet (20 mg total) by mouth daily. 10/20/19  Yes Mikhail, Pensions consultant, DO  galantamine (RAZADYNE ER) 8 MG 24 hr capsule Take 8 mg by mouth daily with breakfast.   Yes [provider]  HYDROcodone-acetaminophen (NORCO/VICODIN) 5-325 MG tablet Take 1-2 tablets by mouth every 6 (six) hours as needed for moderate pain. 10/20/19  Yes Mikhail, Maryann, DO  ipratropium-albuterol (DUONEB) 0.5-2.5 (3) MG/3ML SOLN Take 3 mLs by nebulization every 6 (six) hours as needed (shortness of breath).   Yes [provider]  loperamide (IMODIUM) 2 MG capsule Take 2 mg by mouth 3 (three) times daily as needed for diarrhea or loose stools.  07/14/19  Yes [provider]  metoprolol tartrate (LOPRESSOR) 25 MG tablet Take 75 mg by mouth 2 (two) times daily.   Yes [provider]  Multiple Vitamins-Minerals (CERTAVITE SENIOR/ANTIOXIDANT PO) Take 1 tablet by mouth daily.    Yes [provider]  Polyethyl Glycol-Propyl Glycol (SYSTANE) 0.4-0.3 % SOLN Place 1 drop into both eyes every hour as needed  (dry eyes/irritation).   Yes [provider]  potassium chloride SA (K-DUR,KLOR-CON) 20 MEQ tablet Take 20 mEq by mouth every other day.   Yes [provider]  psyllium (REGULOID) 0.52 g capsule Take 0.52 g by mouth daily.   Yes [provider]  simvastatin (ZOCOR) 10 MG tablet Take 1 tablet (10 mg total) by mouth daily at 6 PM. 07/01/14  Yes Biby, Jani Files, NP  Tiotropium Bromide-Olodaterol (STIOLTO RESPIMAT) 2.5-2.5 MCG/ACT AERS Inhale 2 puffs into the lungs daily.   Yes [provider]  ziprasidone (GEODON) 20 MG capsule TAKE ONE CAPSULE BY MOUTH DAILY IN THE AFTERNOON **DO NOT CRUSH** **NO REFILLS** Patient taking differently: Take 20 mg by mouth daily at 2 PM.  09/10/19  Yes Cottle, Steva Ready., MD  Allergies    Flagyl [metronidazole]  Review of Systems   Review of Systems  Constitutional: Positive for fatigue. Negative for chills and fever.  HENT: Negative for ear pain and sore throat.   Eyes: Negative for pain and visual disturbance.  Respiratory: Negative for cough and shortness of breath.   Cardiovascular: Negative for chest pain and palpitations.  Gastrointestinal: Negative for abdominal pain and vomiting.  Genitourinary: Negative for dysuria and hematuria.  Musculoskeletal: Negative for arthralgias and back pain.  Skin: Negative for color change and rash.  Neurological: Negative for seizures and syncope.  All other systems reviewed and are negative.   Physical Exam Updated Vital Signs BP (!) 121/53   Pulse (!) 143   Temp 98.9 F (37.2 C) (Oral)   Resp 14   SpO2 97%   Physical Exam Vitals and nursing note reviewed.  Constitutional:      General: She is not in acute distress.    Appearance: She is well-developed.  HENT:     Head: Normocephalic and atraumatic.  Eyes:     Conjunctiva/sclera: Conjunctivae normal.  Cardiovascular:     Rate and Rhythm: Bradycardia present. Rhythm irregular.     Pulses: Normal pulses.     Heart  sounds: No murmur heard.   Pulmonary:     Effort: Pulmonary effort is normal. No respiratory distress.     Breath sounds: Normal breath sounds.  Abdominal:     Palpations: Abdomen is soft.     Tenderness: There is no abdominal tenderness.  Musculoskeletal:        General: No deformity or signs of injury.     Cervical back: Neck supple.  Skin:    General: Skin is warm and dry.  Neurological:     General: No focal deficit present.     Mental Status: She is alert.     ED Results / Procedures / Treatments   Labs (all labs ordered are listed, but only abnormal results are displayed) Labs Reviewed  CBC WITH DIFFERENTIAL/PLATELET - Abnormal; Notable for the following components:      Result Value   RBC 3.41 (*)    Hemoglobin 10.8 (*)    HCT 33.3 (*)    RDW 16.0 (*)    Abs Immature Granulocytes 0.09 (*)    All other components within normal limits  BASIC METABOLIC PANEL - Abnormal; Notable for the following components:   Glucose, Bld 103 (*)    BUN 31 (*)    Creatinine, Ser 3.02 (*)    Calcium 8.3 (*)    GFR calc non Af Amer 13 (*)    GFR calc Af Amer 15 (*)    All other components within normal limits  RESPIRATORY PANEL BY RT PCR (FLU A&B, COVID)  MAGNESIUM  URINALYSIS, ROUTINE W REFLEX MICROSCOPIC  VITAMIN B12  FOLATE  IRON AND TIBC  FERRITIN  RETICULOCYTES  TSH  T4, FREE  CBC  COMPREHENSIVE METABOLIC PANEL  CK  TYPE AND SCREEN  TROPONIN I (HIGH SENSITIVITY)  TROPONIN I (HIGH SENSITIVITY)    EKG EKG Interpretation  Date/Time:  Friday November 27 2019 12:17:21 EDT Ventricular Rate:  54 PR Interval:    QRS Duration: 108 QT Interval:  558 QTC Calculation: 644 R Axis:   118 Text Interpretation: Atrial fibrillation Borderline low voltage, extremity leads Probable right ventricular hypertrophy Prolonged QT interval Artifact in lead(s) I III aVL V1 Confirmed by Marianna Fuss ([id number]) on 11/27/2019 12:55:22 PM   Radiology DG Chest Portable 1 View  Result  Date: 11/27/2019 CLINICAL DATA:  Shortness of breath.  Atrial fibrillation EXAM: PORTABLE CHEST 1 VIEW COMPARISON:  October 16, 2019 chest radiograph and CT angiogram chest FINDINGS: Lungs are clear. There is cardiomegaly with pulmonary vascularity normal. No adenopathy. No pneumothorax. No bone lesions. IMPRESSION: Cardiomegaly.  Lungs clear. Electronically Signed   By: Bretta Bang III M.D.   On: 11/27/2019 13:39    Procedures .Critical Care Performed by: [name], MD Authorized by: [name], MD   Critical care provider statement:    Critical care time (minutes):  34   Critical care was necessary to treat or prevent imminent or life-threatening deterioration of the following conditions:  Cardiac failure   Critical care was time spent personally by me on the following activities:  Discussions with consultants, evaluation of patient's response to treatment, examination of patient, ordering and performing treatments and interventions, ordering and review of laboratory studies, ordering and review of radiographic studies, pulse oximetry, re-evaluation of patient's condition, obtaining history from patient or surrogate and review of old charts   (including critical care time)  Medications Ordered in ED Medications  apixaban (ELIQUIS) tablet 2.5 mg (has no administration in time range)  citalopram (CELEXA) tablet 20 mg (has no administration in time range)  galantamine (RAZADYNE ER) 24 hr capsule 8 mg (has no administration in time range)  ipratropium-albuterol (DUONEB) 0.5-2.5 (3) MG/3ML nebulizer solution 3 mL (has no administration in time range)  ziprasidone (GEODON) capsule 20 mg (has no administration in time range)  0.9 %  sodium chloride infusion (has no administration in time range)  polyvinyl alcohol (LIQUIFILM TEARS) 1.4 % ophthalmic solution 1 drop (has no administration in time range)  psyllium (HYDROCIL/METAMUCIL) 1 packet (has no administration in time  range)  sodium chloride 0.9 % bolus 500 mL (0 mLs Intravenous Stopped 11/27/19 1544)    ED Course  I have reviewed the triage vital signs and the nursing notes.  Pertinent labs & imaging results that were available during my care of the patient were reviewed by me and considered in my medical decision making (see chart for details).  Clinical Course as of Nov 26 1712  Fri Nov 27, 2019  1450 AKI, suspect this and medications driving bradycardia  Basic metabolic panel(!) [RD]  1457 [place] to see   [RD]  1543 D/w [place] - recommends hold amio and metoprolol for now, when restarted,    [RD]    Clinical Course User Index [RD] [name], MD   MDM Rules/Calculators/A&P                         86 year old lady presenting to ER with concern for generalized weakness.  Patient found to be profoundly bradycardic.  EKG demonstrating A. fib with slow ventricular rate.  Lab work demonstrating new AKI.  Suspect her beta-blocker and amiodarone are causing the bradycardia, exacerbated by the AKI.  Reviewed case with on-call cardiology, recommended medicine admission and holding her amiodarone and metoprolol for now.  Consulted Dr. Allena Katz with hospitalist service.  She will admit for further management.  Despite her bradycardia, patient's mentation remained stable while in ER, BP remained stable in ER.    Final Clinical Impression(s) / ED Diagnoses Final diagnoses:  Atrial fibrillation, unspecified type (HCC)  Bradycardia  Symptomatic bradycardia    Rx / DC Orders ED Discharge Orders    None       [name], MD 11/27/19 1714

## 2019-11-28 ENCOUNTER — Inpatient Hospital Stay (HOSPITAL_COMMUNITY): Payer: Medicare PPO

## 2019-11-28 ENCOUNTER — Encounter (HOSPITAL_COMMUNITY): Payer: Self-pay | Admitting: Internal Medicine

## 2019-11-28 DIAGNOSIS — F419 Anxiety disorder, unspecified: Secondary | ICD-10-CM | POA: Diagnosis present

## 2019-11-28 DIAGNOSIS — J9601 Acute respiratory failure with hypoxia: Secondary | ICD-10-CM | POA: Diagnosis not present

## 2019-11-28 DIAGNOSIS — R001 Bradycardia, unspecified: Secondary | ICD-10-CM

## 2019-11-28 DIAGNOSIS — I4891 Unspecified atrial fibrillation: Secondary | ICD-10-CM | POA: Diagnosis not present

## 2019-11-28 DIAGNOSIS — I110 Hypertensive heart disease with heart failure: Secondary | ICD-10-CM | POA: Diagnosis present

## 2019-11-28 DIAGNOSIS — M47812 Spondylosis without myelopathy or radiculopathy, cervical region: Secondary | ICD-10-CM | POA: Diagnosis present

## 2019-11-28 DIAGNOSIS — S728X1D Other fracture of right femur, subsequent encounter for closed fracture with routine healing: Secondary | ICD-10-CM

## 2019-11-28 DIAGNOSIS — M810 Age-related osteoporosis without current pathological fracture: Secondary | ICD-10-CM | POA: Diagnosis present

## 2019-11-28 DIAGNOSIS — W19XXXD Unspecified fall, subsequent encounter: Secondary | ICD-10-CM | POA: Diagnosis present

## 2019-11-28 DIAGNOSIS — R6520 Severe sepsis without septic shock: Secondary | ICD-10-CM | POA: Diagnosis not present

## 2019-11-28 DIAGNOSIS — I442 Atrioventricular block, complete: Secondary | ICD-10-CM | POA: Diagnosis present

## 2019-11-28 DIAGNOSIS — A419 Sepsis, unspecified organism: Secondary | ICD-10-CM | POA: Diagnosis not present

## 2019-11-28 DIAGNOSIS — I4819 Other persistent atrial fibrillation: Secondary | ICD-10-CM | POA: Diagnosis present

## 2019-11-28 DIAGNOSIS — Z66 Do not resuscitate: Secondary | ICD-10-CM | POA: Diagnosis present

## 2019-11-28 DIAGNOSIS — I459 Conduction disorder, unspecified: Secondary | ICD-10-CM

## 2019-11-28 DIAGNOSIS — E785 Hyperlipidemia, unspecified: Secondary | ICD-10-CM | POA: Diagnosis present

## 2019-11-28 DIAGNOSIS — L309 Dermatitis, unspecified: Secondary | ICD-10-CM | POA: Diagnosis present

## 2019-11-28 DIAGNOSIS — Z79899 Other long term (current) drug therapy: Secondary | ICD-10-CM | POA: Diagnosis not present

## 2019-11-28 DIAGNOSIS — F32A Depression, unspecified: Secondary | ICD-10-CM | POA: Diagnosis present

## 2019-11-28 DIAGNOSIS — J449 Chronic obstructive pulmonary disease, unspecified: Secondary | ICD-10-CM | POA: Diagnosis present

## 2019-11-28 DIAGNOSIS — I10 Essential (primary) hypertension: Secondary | ICD-10-CM | POA: Diagnosis not present

## 2019-11-28 DIAGNOSIS — F0390 Unspecified dementia without behavioral disturbance: Secondary | ICD-10-CM | POA: Diagnosis present

## 2019-11-28 DIAGNOSIS — N179 Acute kidney failure, unspecified: Secondary | ICD-10-CM | POA: Diagnosis present

## 2019-11-28 DIAGNOSIS — I5043 Acute on chronic combined systolic (congestive) and diastolic (congestive) heart failure: Secondary | ICD-10-CM | POA: Diagnosis present

## 2019-11-28 DIAGNOSIS — S7291XD Unspecified fracture of right femur, subsequent encounter for closed fracture with routine healing: Secondary | ICD-10-CM | POA: Diagnosis not present

## 2019-11-28 DIAGNOSIS — Z20822 Contact with and (suspected) exposure to covid-19: Secondary | ICD-10-CM | POA: Diagnosis present

## 2019-11-28 DIAGNOSIS — T462X5A Adverse effect of other antidysrhythmic drugs, initial encounter: Secondary | ICD-10-CM | POA: Diagnosis present

## 2019-11-28 DIAGNOSIS — I480 Paroxysmal atrial fibrillation: Secondary | ICD-10-CM

## 2019-11-28 DIAGNOSIS — F259 Schizoaffective disorder, unspecified: Secondary | ICD-10-CM | POA: Diagnosis present

## 2019-11-28 DIAGNOSIS — I483 Typical atrial flutter: Secondary | ICD-10-CM | POA: Diagnosis not present

## 2019-11-28 DIAGNOSIS — I4892 Unspecified atrial flutter: Secondary | ICD-10-CM | POA: Diagnosis present

## 2019-11-28 DIAGNOSIS — Z7901 Long term (current) use of anticoagulants: Secondary | ICD-10-CM | POA: Diagnosis not present

## 2019-11-28 DIAGNOSIS — I5021 Acute systolic (congestive) heart failure: Secondary | ICD-10-CM | POA: Diagnosis not present

## 2019-11-28 DIAGNOSIS — I5033 Acute on chronic diastolic (congestive) heart failure: Secondary | ICD-10-CM | POA: Diagnosis not present

## 2019-11-28 DIAGNOSIS — J810 Acute pulmonary edema: Secondary | ICD-10-CM | POA: Diagnosis not present

## 2019-11-28 DIAGNOSIS — Z881 Allergy status to other antibiotic agents status: Secondary | ICD-10-CM | POA: Diagnosis not present

## 2019-11-28 LAB — COMPREHENSIVE METABOLIC PANEL
ALT: 33 U/L (ref 0–44)
AST: 46 U/L — ABNORMAL HIGH (ref 15–41)
Albumin: 3.2 g/dL — ABNORMAL LOW (ref 3.5–5.0)
Alkaline Phosphatase: 64 U/L (ref 38–126)
Anion gap: 12 (ref 5–15)
BUN: 40 mg/dL — ABNORMAL HIGH (ref 8–23)
CO2: 24 mmol/L (ref 22–32)
Calcium: 8.5 mg/dL — ABNORMAL LOW (ref 8.9–10.3)
Chloride: 102 mmol/L (ref 98–111)
Creatinine, Ser: 3.73 mg/dL — ABNORMAL HIGH (ref 0.44–1.00)
GFR calc Af Amer: 11 mL/min — ABNORMAL LOW (ref >60)
GFR calc non Af Amer: 10 mL/min — ABNORMAL LOW (ref >60)
Glucose, Bld: 136 mg/dL — ABNORMAL HIGH (ref 70–99)
Potassium: 3.4 mmol/L — ABNORMAL LOW (ref 3.5–5.1)
Sodium: 138 mmol/L (ref 135–145)
Total Bilirubin: 0.6 mg/dL (ref 0.3–1.2)
Total Protein: 6.3 g/dL — ABNORMAL LOW (ref 6.5–8.1)

## 2019-11-28 LAB — URINALYSIS, ROUTINE W REFLEX MICROSCOPIC
Bacteria, UA: NONE SEEN
Bilirubin Urine: NEGATIVE
Bilirubin Urine: NEGATIVE
Glucose, UA: NEGATIVE mg/dL
Glucose, UA: NEGATIVE mg/dL
Ketones, ur: NEGATIVE mg/dL
Ketones, ur: NEGATIVE mg/dL
Leukocytes,Ua: NEGATIVE
Nitrite: NEGATIVE
Nitrite: NEGATIVE
Protein, ur: 100 mg/dL — AB
Protein, ur: 30 mg/dL — AB
Specific Gravity, Urine: 1.012 (ref 1.005–1.030)
Specific Gravity, Urine: 1.012 (ref 1.005–1.030)
Squamous Epithelial / HPF: >50 — ABNORMAL HIGH (ref 0–5)
pH: 5 (ref 5.0–8.0)
pH: 5 (ref 5.0–8.0)

## 2019-11-28 LAB — CBC
HCT: 32.7 % — ABNORMAL LOW (ref 36.0–46.0)
Hemoglobin: 10.4 g/dL — ABNORMAL LOW (ref 12.0–15.0)
MCH: 31 pg (ref 26.0–34.0)
MCHC: 31.8 g/dL (ref 30.0–36.0)
MCV: 97.3 fL (ref 80.0–100.0)
Platelets: 206 10*3/uL (ref 150–400)
RBC: 3.36 MIL/uL — ABNORMAL LOW (ref 3.87–5.11)
RDW: 16 % — ABNORMAL HIGH (ref 11.5–15.5)
WBC: 9.3 10*3/uL (ref 4.0–10.5)
nRBC: 0 % (ref 0.0–0.2)

## 2019-11-28 LAB — CK: Total CK: 54 U/L (ref 38–234)

## 2019-11-28 LAB — MRSA PCR SCREENING: MRSA by PCR: NEGATIVE

## 2019-11-28 MED ORDER — SODIUM CHLORIDE 0.9 % IV SOLN
1.0000 g | INTRAVENOUS | Status: DC
  Administered 2019-11-28: 1 g via INTRAVENOUS
  Filled 2019-11-28 (×2): qty 1

## 2019-11-28 MED ORDER — LACTATED RINGERS IV BOLUS
250.0000 mL | Freq: Once | INTRAVENOUS | Status: AC
  Administered 2019-11-28: 250 mL via INTRAVENOUS

## 2019-11-28 MED ORDER — VANCOMYCIN VARIABLE DOSE PER UNSTABLE RENAL FUNCTION (PHARMACIST DOSING): Status: DC

## 2019-11-28 MED ORDER — LACTATED RINGERS IV SOLN: INTRAVENOUS | Status: DC

## 2019-11-28 MED ORDER — NITROGLYCERIN IN D5W 200-5 MCG/ML-% IV SOLN
0.0000 ug/min | INTRAVENOUS | Status: DC
  Administered 2019-11-28: 5 ug/min via INTRAVENOUS
  Filled 2019-11-28: qty 250

## 2019-11-28 MED ORDER — FUROSEMIDE 10 MG/ML IJ SOLN
60.0000 mg | Freq: Once | INTRAMUSCULAR | Status: AC
  Administered 2019-11-28: 60 mg via INTRAVENOUS
  Filled 2019-11-28: qty 6

## 2019-11-28 MED ORDER — VANCOMYCIN HCL IN DEXTROSE 1-5 GM/200ML-% IV SOLN
1000.0000 mg | Freq: Once | INTRAVENOUS | Status: AC
  Administered 2019-11-28: 1000 mg via INTRAVENOUS
  Filled 2019-11-28: qty 200

## 2019-11-28 NOTE — Consult Note (Signed)
Cardiology Consultation:   Patient ID: Yolanda Baker MRN: [id number]; DOB: Aug 08, 1923  Admit date: 11/27/2019 Date of Consult: 11/28/2019  Primary Care Provider: Rodrigo Ran, MD [facility] Cardiologist: Chilton Si, MD    Patient Profile:   Yolanda Baker is a 86 y.o. female with a hx of paroxysmal atrial fibrillation, prior CVA, hypertension, dementia who is being seen today for the evaluation of bradycardia at the request of Yolanda Ade, MD.  History of Present Illness:   Most recent echocardiogram July 2021 showed ejection fraction 45 to 50%, mild left ventricular hypertrophy, moderate left atrial enlargement, mild right atrial enlargement, mild mitral regurgitation, moderate tricuspid regurgitation.  Note patient is on metoprolol and amiodarone for paroxysmal atrial fibrillation.  At most recent office visit patient was in persistent atrial fibrillation.  Cardioversion was discussed but patient's daughter wanted to avoid.  Patient states that she developed shortness of breath yesterday.  She denies chest pain, presyncope or syncope.  No fevers or chills or productive cough.  Patient also apparently complained of fatigue and was noted to be bradycardic when her pulse was checked.  She was sent to the emergency room.  Initial heart rate 43.  Patient had transient percutaneous pacing and was placed on Isuprel.  Cardiology now asked to evaluate.   Past Medical History:  Diagnosis Date  . Anemia   . Anxiety   . Arthritis    neck  . Bruises easily   . Chronic diastolic CHF (congestive heart failure) (HCC)   . COPD (chronic obstructive pulmonary disease) (HCC)   . Dementia (HCC)   . Depression   . Dysrhythmia    hx A-FLUTTER after hip surg April 2015- take metoprolol  . Eczema   . Falls   . Hip fracture (HCC)   . Hip joint pain    RT  . Hypertension   . Osteoporosis   . Persistent atrial fibrillation (HCC) 08/04/2019  . Pneumonia    august 2017  . Stroke [facility])      Past Surgical History:  Procedure Laterality Date  . ABDOMINAL HYSTERECTOMY    . FRACTURE SURGERY Right 06/05/2013   hip  . HIP ARTHROPLASTY Right 10/03/2013   Procedure: CONVERSION OF PREVIOUS RIGHT HIP SURGERY TO A RIGHT TOTAL HIP ARTHROPLASTY;  Surgeon: Loanne Drilling, MD;  Location: [facility];  Service: Orthopedics;  Laterality: Right;  . INTRAMEDULLARY (IM) NAIL INTERTROCHANTERIC Right 06/05/2013   Procedure: INTRAMEDULLARY (IM) NAIL INTERTROCHANTRIC;  Surgeon: Loanne Drilling, MD;  Location: [facility];  Service: Orthopedics;  Laterality: Right;  . JOINT REPLACEMENT     left hip  . left hip Left 2008  . LEG SURGERY Right    x2     Inpatient Medications: Scheduled Meds: . apixaban  2.5 mg Oral BID  . Chlorhexidine Gluconate Cloth  6 each Topical Q0600  . citalopram  20 mg Oral Daily  . fentaNYL (SUBLIMAZE) injection  25 mcg Intravenous Once  . psyllium  1 packet Oral Daily  . ziprasidone  20 mg Oral Q1400   Continuous Infusions: . isoproterenol (ISUPREL) infusion 5 mcg/min (11/28/19 0700)  . lactated ringers 75 mL/hr at 11/28/19 0700   PRN Meds: ipratropium-albuterol, polyvinyl alcohol  Allergies:    Allergies  Allergen Reactions  . Flagyl [Metronidazole] Other (See Comments)    Fingers get "numb"    Social History:   Social History   Socioeconomic History  . Marital status: Widowed    Spouse name: Not on file  . Number  of children: 3  . Years of education: 34yr colleg  . Highest education level: Not on file  Occupational History  . Occupation: Retired   Tobacco Use  . Smoking status: Never Smoker  . Smokeless tobacco: Never Used  Vaping Use  . Vaping Use: Never used  Substance and Sexual Activity  . Alcohol use: No    Alcohol/week: 0.0 standard drinks  . Drug use: No  . Sexual activity: Never  Other Topics Concern  . Not on file  Social History Narrative   Drinks about 3 cups of coffee a day    Social Determinants of Health   Financial Resource  Strain:   . Difficulty of Paying Living Expenses: Not on file  Food Insecurity:   . Worried About Programme researcher, broadcasting/film/video in the Last Year: Not on file  . Ran Out of Food in the Last Year: Not on file  Transportation Needs:   . Lack of Transportation (Medical): Not on file  . Lack of Transportation (Non-Medical): Not on file  Physical Activity:   . Days of Exercise per Week: Not on file  . Minutes of Exercise per Session: Not on file  Stress:   . Feeling of Stress : Not on file  Social Connections:   . Frequency of Communication with Friends and Family: Not on file  . Frequency of Social Gatherings with Friends and Family: Not on file  . Attends Religious Services: Not on file  . Active Member of Clubs or Organizations: Not on file  . Attends Banker Meetings: Not on file  . Marital Status: Not on file  Intimate Partner Violence:   . Fear of Current or Ex-Partner: Not on file  . Emotionally Abused: Not on file  . Physically Abused: Not on file  . Sexually Abused: Not on file    Family History:    Family History  Problem Relation Age of Onset  . Heart attack Father   . Hypertension Father   . Hypertension Sister   . Stroke Sister   . Hypertension Brother      ROS:  Please see the history of present illness.  Patient complains of increased dyspnea.  However there is no fevers, chills, productive cough, hemoptysis.  She does have generalized weakness. All other ROS reviewed and negative.     Physical Exam/Data:   Vitals:   11/28/19 0600 11/28/19 0630 11/28/19 0700 11/28/19 0757  BP: (!) 129/46 (!) 100/50 (!) 125/41   Pulse: 64 61 62   Resp: 19 (!) 22 (!) 24   Temp:    98.7 F (37.1 C)  TempSrc:      SpO2: 96% 97% 95%   Weight:      Height:        Intake/Output Summary (Last 24 hours) at 11/28/2019 0841 Last data filed at 11/28/2019 0700 Gross per 24 hour  Intake 2008.38 ml  Output 0 ml  Net 2008.38 ml   Last 3 Weights 11/27/2019 11/27/2019 10/21/2019    Weight (lbs) 111 lb 1.8 oz 109 lb 9.1 oz 101 lb 6.4 oz  Weight (kg) 50.4 kg 49.7 kg 45.995 kg     Body mass index is 20.32 kg/m.  General:  Well nourished, frail in no acute distress HEENT: normal Lymph: no adenopathy Neck: no JVD Endocrine:  No thryomegaly Vascular: No carotid bruits; FA pulses 2+ bilaterally without bruits  Cardiac: Bradycardic and regular.  No murmurs. Lungs:  clear to auscultation bilaterally, no wheezing, rhonchi or  rales  Abd: soft, nontender, no hepatomegaly  Ext: no edema; chronic skin changes and varicosities noted. Musculoskeletal:  No deformities Skin: warm and dry  Neuro:  CNs 2-12 intact, no focal abnormalities noted; resting tremor noted. Psych:  Normal affect   EKG:  The EKG was personally reviewed and demonstrates: Marked sinus bradycardia with PAC, first-degree AV block, artifact due to tremor. Telemetry:  Telemetry was personally reviewed and demonstrates: Sinus bradycardia with heart rate in the 50s to 60s at present.  Laboratory Data:  High Sensitivity Troponin:   Recent Labs  Lab 11/27/19 1341 11/27/19 1544  TROPONINIHS 17 17     Chemistry Recent Labs  Lab 11/27/19 1341 11/28/19 0113  NA 138 138  K 3.6 3.4*  CL 101 102  CO2 26 24  GLUCOSE 103* 136*  BUN 31* 40*  CREATININE 3.02* 3.73*  CALCIUM 8.3* 8.5*  GFRNONAA 13* 10*  GFRAA 15* 11*  ANIONGAP 11 12    Recent Labs  Lab 11/28/19 0113  PROT 6.3*  ALBUMIN 3.2*  AST 46*  ALT 33  ALKPHOS 64  BILITOT 0.6   Hematology Recent Labs  Lab 11/27/19 1341 11/27/19 1916 11/28/19 0113  WBC 9.1  --  9.3  RBC 3.41* 3.44* 3.36*  HGB 10.8*  --  10.4*  HCT 33.3*  --  32.7*  MCV 97.7  --  97.3  MCH 31.7  --  31.0  MCHC 32.4  --  31.8  RDW 16.0*  --  16.0*  PLT 232  --  206    Radiology/Studies:  DG Chest Portable 1 View  Result Date: 11/27/2019 CLINICAL DATA:  Shortness of breath.  Atrial fibrillation EXAM: PORTABLE CHEST 1 VIEW COMPARISON:  October 16, 2019 chest  radiograph and CT angiogram chest FINDINGS: Lungs are clear. There is cardiomegaly with pulmonary vascularity normal. No adenopathy. No pneumothorax. No bone lesions. IMPRESSION: Cardiomegaly.  Lungs clear. Electronically Signed   By: Bretta Bang III M.D.   On: 11/27/2019 13:39    Assessment and Plan:   1. Bradycardia-patient had transient bradycardia.  Her heart rate is now in the 50s and 60s.  She has not had any symptoms of presyncope or syncope.  She is on both amiodarone and metoprolol which will be held.  Will wean Isuprel to off.  Follow on telemetry.  Given patient's age and overall medical condition would like to avoid pacemaker. 2. Paroxysmal atrial fibrillation-patient has become very symptomatic with atrial fibrillation in the past.  She is in sinus at present.  We will hold amiodarone and metoprolol for now given bradycardia.  If heart rate improves will likely resume amiodarone but continue off of metoprolol.  Continue apixaban (CHADSvasc 6). 3. Kidney disease-creatinine significantly elevated compared to August 25.  Question ATN from transient bradycardia/decreased cardiac output.  Hold Lasix.  Follow renal function. 4. No CODE BLUE  We will follow.    For questions or updates, please contact [facility] Please consult [email or web address] for contact info under    Signed, Olga Millers, MD  11/28/2019 8:41 AM

## 2019-11-28 NOTE — Consult Note (Signed)
NAMEAALIYANA Baker, MRN:  [id number], DOB:  1923-07-31, LOS: 0 ADMISSION DATE:  11/27/2019, CONSULTATION DATE: 11/27/19 REFERRING MD: Dr Allena Katz, CHIEF COMPLAINT:  bradycardia  Brief History   86 y/o female with symptomatic bradycardia   History of present illness   This is a 86y/o female that presented to the ER today for bradycardia.  Pt was seen by PT today and heart rate was low so was sent to ER.  On arrival the pt was found to be bradycardic but with a normal bp.  She was admitted to PCU with  percutaneous pacing set at 50bpm.  CCM was called to bedside on arrival to the PCU.  Pt is awake and alert.  She denies discomfort despite being paced.  Underlying rhythm is complete heart block at 20bpm.  She denies chest pain/chest pressure/ SOB/palpitations once the pacer was decreased.  Past Medical History  CHF - chronic diastolic Afib COPD HTN   Significant Hospital Events     Consults:  Cardiology  Procedures:  none  Significant Diagnostic Tests:    Micro Data:  Blood cx 10/2> Urine cx 10/2>  Antimicrobials:  vanc > 10/2 Cefepime >10/2  Interval history:  This morning denies complaints.   Objective   Blood pressure (!) 142/47, pulse (!) 51, temperature 99.6 F (37.6 C), temperature source Oral, resp. rate 20, height 5\' 2"  (1.575 m), weight 50.4 kg, SpO2 93 %.        Intake/Output Summary (Last 24 hours) at 11/28/2019 1402 Last data filed at 11/28/2019 1300 Gross per 24 hour  Intake 2488.38 ml  Output 100 ml  Net 2388.38 ml   Filed Weights   11/27/19 2111 11/27/19 2306  Weight: 49.7 kg 50.4 kg    Examination: General: Frail appearing elderly woman laying in bed comfortably, tremulous HENT: [place]/AT, eyes anicteric Lungs: Breathing comfortably on nasal cannula, no tachypnea.  Clear to auscultation bilaterally. Cardiovascular: Heart rate 50s to 60s, atrial fibrillation Abdomen: Soft, nontender, nondistended Extremities: No peripheral edema, no clubbing or  cyanosis Neuro: Awake alert, answering questions appropriately.  Globally very weak.  Resting tremor of upper extremities. GU: Pure wick with minimal urine output Derm: Multiple bruises and abrasions on shins bilaterally.  No rashes.  Assessment & Plan:  Symptomatic bradycardia 2/2 amiodarone and lopressor use as an outpatient.  Unsure if her AKI contributed to medication toxicity or if AKI is due to hypoperfusion. Chronic HFpEF  Chronic atrial fibrillation; previously had converted to complete heart block -Weaning off isoproterenol per cardiology.  Appreciate their recommendations. -Has backup percutaneous pacemaker with rate set of 40s. -Agree with cardiology that her age and frailty likely will contribute to poor long-term prognosis if her bradycardia does not resolve. -Continue Eliquis  AKI-unclear chronicity, possibly related to hypoperfusion from low output with bradycardia.  No mention of reduced p.o. intake prior to admission. -Cautiously continue IV fluids. -Strict I/O -Repeat UA  COPD--unsure where this history comes in; she is a never smoker -No intervention  HTN -Holding PTA metoprolol and amiodarone  Fever -Start empiric antibiotics -Blood cultures -Repeat UA given contamination    Best practice:  Diet: soft  Pain/Anxiety/Delirium protocol (if indicated): fentanyl as needed VAP protocol (if indicated): n/a DVT prophylaxis: heparin sq GI prophylaxis:  Glucose control:  Mobility: bedrest Code Status:DNR Family Communication: Updated daughter at bedside Disposition: con't ICU  Labs   CBC: Recent Labs  Lab 11/27/19 1341 11/28/19 0113  WBC 9.1 9.3  NEUTROABS 6.0  --   HGB  10.8* 10.4*  HCT 33.3* 32.7*  MCV 97.7 97.3  PLT 232 206    Basic Metabolic Panel: Recent Labs  Lab 11/27/19 1341 11/28/19 0113  NA 138 138  K 3.6 3.4*  CL 101 102  CO2 26 24  GLUCOSE 103* 136*  BUN 31* 40*  CREATININE 3.02* 3.73*  CALCIUM 8.3* 8.5*  MG 1.9  --     GFR: Estimated Creatinine Clearance: 7.3 mL/min (A) (by C-G formula based on SCr of 3.73 mg/dL (H)). Recent Labs  Lab 11/27/19 1341 11/28/19 0113  WBC 9.1 9.3    Liver Function Tests: Recent Labs  Lab 11/28/19 0113  AST 46*  ALT 33  ALKPHOS 64  BILITOT 0.6  PROT 6.3*  ALBUMIN 3.2*   No results for input(s): LIPASE, AMYLASE in the last 168 hours. No results for input(s): AMMONIA in the last 168 hours.  ABG    Component Value Date/Time   PHART 7.520 (H) 10/16/2019 1600   PCO2ART 34.1 10/16/2019 1600   PO2ART 90 10/16/2019 1600   HCO3 27.8 10/16/2019 1600   TCO2 29 10/16/2019 1600   ACIDBASEDEF 1.0 09/08/2006 1630   O2SAT 98.0 10/16/2019 1600     Coagulation Profile: No results for input(s): INR, PROTIME in the last 168 hours.  Cardiac Enzymes: Recent Labs  Lab 11/28/19 0113  CKTOTAL 54    HbA1C: Hgb A1c MFr Bld  Date/Time Value Ref Range Status  06/30/2014 02:46 AM 5.9 (H) 4.8 - 5.6 % Final    Comment:    (NOTE)         Pre-diabetes: 5.7 - 6.4         Diabetes: >6.4         Glycemic control for adults with diabetes: <7.0     CBG: Recent Labs  Lab 11/27/19 2249  GLUCAP 131*      This patient is critically ill with multiple organ system failure which requires frequent high complexity decision making, assessment, support, evaluation, and titration of therapies. This was completed through the application of advanced monitoring technologies and extensive interpretation of multiple databases. During this encounter critical care time was devoted to patient care services described in this note for 40 minutes.  Steffanie Dunn, DO 11/28/19 2:12 PM [facility]

## 2019-11-28 NOTE — Progress Notes (Signed)
Spoke with Dr. Nicole Cella regarding patient not having any urine output and MD gave order to bladder scan patient and if volume is less than 100 cc in bladder then give 250 cc LR bolus.  Bladder scanned and result 98 cc. LR bolus ordered.

## 2019-11-28 NOTE — Progress Notes (Signed)
eLink Physician-Brief Progress Note Patient Name: Yolanda Baker DOB: 09-16-1923 MRN: [id number]   Date of Service  11/28/2019  HPI/Events of Note  New patient evaluation. ICU transfer was requested for this 53F who presented with symptomatic bradycardia secondary to CHB who required transcutaneously pacing. Trop HS negative x2. Patient was brought to ICU and set up for transvenous pacer insertion, however as this was being done the patient was started on isoproterenol and subsequently rhythm changed to sinus rhythm at 56 bpm per Dr. Deno Etienne note, so TVP insertion was deferred.  Suspected cause of CHB is amiodarone and lopressor which she takes at home for Afib. AKI present on admission (Cr 3.0 vs 1.0 baseline from 09/2019) may be secondary to CHB/symptomatic bradycardia or be part of the etiology (drug accumulation causing AV block).  On my remote exam, she remains on isoproterenol at 5 mcg/min with BP 129/52. Rhythm now appears to be Afib (rate-controlled) with ventricular rate in 60-70s. She is awake and alert and interacting with staff.   eICU Interventions  # Cardiac: - Hold amio and lopressor. - Hold off on TVP given currently back in rate-controlled Afib with rates in 60s-70s. - Cardiology consult. - Isoproterenol drip as needed for HR.  # AKI: - Likely secondary to symptomatic bradycardia resulting in renal hypoperfusion. - S/p modest IVF bolus in ED. - D/c NS drip, replace with LR @ 75.     Intervention Category Evaluation Type: New Patient Evaluation  [name] 11/28/2019, 1:10 AM

## 2019-11-28 NOTE — Progress Notes (Signed)
Called and spoke with patient's daughter [name] and made her aware that patient was transferred to ICU last night and that patient is on an IV medication to help keep her heart rate up and it is working. Daughter verbalized understanding and stated she would be here to visit around 0830.

## 2019-11-28 NOTE — Progress Notes (Signed)
I/O cathed patient per Dr. Chestine Spore for a sterile urine sample (pt uses pure wick). While patient was in supine position her face became blue, and she started purse lipped breathing,couldn't speak due to breathlessness. sats were in the 80's on 2 l [place]. Upon returning to semi fowler/high fowler patient 's color returned to pale/pink but took about a minute to recover enough to speak. She continued to be short of breath. Dr. Chestine Spore aware and orders received. Tammy Sours, RN

## 2019-11-28 NOTE — Consult Note (Signed)
Yolanda Baker, MRN:  [id number], DOB:  May 18, 1923, LOS: 0 ADMISSION DATE:  11/27/2019, CONSULTATION DATE: 11/27/19 REFERRING MD: Dr Allena Katz, CHIEF COMPLAINT:  bradycardia  Brief History   86 y/o female with symptomatic bradycardia   History of present illness   This is a 86y/o female that presented to the ER today for bradycardia.  Pt was seen by PT today and heart rate was low so was sent to ER.  On arrival the pt was found to be bradycardic but with a normal bp.  She was admitted to PCU with  percutaneous pacing set at 50bpm.  CCM was called to bedside on arrival to the PCU.  Pt is awake and alert.  She denies discomfort despite being paced.  Underlying rhythm is complete heart block at 20bpm.  She denies chest pain/chest pressure/ SOB/palpitations once the pacer was decreased.  Past Medical History  CHF - chronic diastolic Afib COPD HTN   Significant Hospital Events     Consults:  Cardiology  Procedures:  none  Significant Diagnostic Tests:    Micro Data:  N/A  Antimicrobials:  none    Objective   Blood pressure (!) 129/52, pulse (!) 58, temperature 99.6 F (37.6 C), temperature source Oral, resp. rate (!) 35, height 5\' 2"  (1.575 m), weight 50.4 kg, SpO2 96 %.        Intake/Output Summary (Last 24 hours) at 11/28/2019 0005 Last data filed at 11/27/2019 2300 Gross per 24 hour  Intake 584.95 ml  Output --  Net 584.95 ml   Filed Weights   11/27/19 2111 11/27/19 2306  Weight: 49.7 kg 50.4 kg    Examination: General: No acute distress Cutaneous pacing underway HENT: AT/[place] mmm Lungs: CTAB no wheezing rales or rhonchi Cardiovascular: Brady RR paced. 1/6 sem Abdomen: soft nt/nd +BS no rebound rigidity or guarding Extremities: distal pulses intact x4  No cyanosis or clubbing Neuro: awake and alert.  Resting tremor, generally weak but nonfocal  GU: pur wick intact   Assessment & Plan:  Symptomatic bradycardia  Possibly related to OP meds of amiodarone  and lopressor Diastolic heart failure Afib that has converted to complete heart block AKI COPD HTN  Plan:  Pt was started on Isuprel while setting up for TVP.   The pt converted to sinus rhythm at 56bpm.  Will hold on tvp at this time.  All equipment set at the bedside if needed.   Percutanous pacemaker was set at a backup rate of 40bpm Cardiology consult pending Transferred to the ICU for further workup. Trop are negative at this time Covid negative Continue to hold amiodarone and lopressor Closely monitor I/O and avoid nephrotoxic drugs  Best practice:  Diet: soft  Pain/Anxiety/Delirium protocol (if indicated): fentanyl as needed VAP protocol (if indicated): n/a DVT prophylaxis: heparin sq GI prophylaxis:  Glucose control:  Mobility: bedrest Code Status:DNR Family Communication:  Disposition: transfer to the ICU  Labs   CBC: Recent Labs  Lab 11/27/19 1341  WBC 9.1  NEUTROABS 6.0  HGB 10.8*  HCT 33.3*  MCV 97.7  PLT 232    Basic Metabolic Panel: Recent Labs  Lab 11/27/19 1341  NA 138  K 3.6  CL 101  CO2 26  GLUCOSE 103*  BUN 31*  CREATININE 3.02*  CALCIUM 8.3*  MG 1.9   GFR: Estimated Creatinine Clearance: 9 mL/min (A) (by C-G formula based on SCr of 3.02 mg/dL (H)). Recent Labs  Lab 11/27/19 1341  WBC 9.1  Liver Function Tests: No results for input(s): AST, ALT, ALKPHOS, BILITOT, PROT, ALBUMIN in the last 168 hours. No results for input(s): LIPASE, AMYLASE in the last 168 hours. No results for input(s): AMMONIA in the last 168 hours.  ABG    Component Value Date/Time   PHART 7.520 (H) 10/16/2019 1600   PCO2ART 34.1 10/16/2019 1600   PO2ART 90 10/16/2019 1600   HCO3 27.8 10/16/2019 1600   TCO2 29 10/16/2019 1600   ACIDBASEDEF 1.0 09/08/2006 1630   O2SAT 98.0 10/16/2019 1600     Coagulation Profile: No results for input(s): INR, PROTIME in the last 168 hours.  Cardiac Enzymes: No results for input(s): CKTOTAL, CKMB, CKMBINDEX,  TROPONINI in the last 168 hours.  HbA1C: Hgb A1c MFr Bld  Date/Time Value Ref Range Status  06/30/2014 02:46 AM 5.9 (H) 4.8 - 5.6 % Final    Comment:    (NOTE)         Pre-diabetes: 5.7 - 6.4         Diabetes: >6.4         Glycemic control for adults with diabetes: <7.0     CBG: Recent Labs  Lab 11/27/19 2249  GLUCAP 131*      Past Medical History  She,  has a past medical history of Anemia, Anxiety, Arthritis, Bruises easily, Chronic diastolic CHF (congestive heart failure) (HCC), COPD (chronic obstructive pulmonary disease) (HCC), Dementia (HCC), Depression, Dysrhythmia, Eczema, Falls, Hip fracture (HCC), Hip joint pain, Hypertension, Osteoporosis, Persistent atrial fibrillation (HCC) (08/04/2019), Pneumonia, and Stroke (HCC).   Surgical History    Past Surgical History:  Procedure Laterality Date  . ABDOMINAL HYSTERECTOMY    . FRACTURE SURGERY Right 06/05/2013   hip  . HIP ARTHROPLASTY Right 10/03/2013   Procedure: CONVERSION OF PREVIOUS RIGHT HIP SURGERY TO A RIGHT TOTAL HIP ARTHROPLASTY;  Surgeon: Loanne Drilling, MD;  Location: [facility];  Service: Orthopedics;  Laterality: Right;  . INTRAMEDULLARY (IM) NAIL INTERTROCHANTERIC Right 06/05/2013   Procedure: INTRAMEDULLARY (IM) NAIL INTERTROCHANTRIC;  Surgeon: Loanne Drilling, MD;  Location: [facility];  Service: Orthopedics;  Laterality: Right;  . JOINT REPLACEMENT     left hip  . left hip Left 2008  . LEG SURGERY Right    x2     Social History   reports that she has never smoked. She has never used smokeless tobacco. She reports that she does not drink alcohol and does not use drugs.   Family History   Her family history includes Heart attack in her father; Hypertension in her brother, father, and sister; Stroke in her sister.   Allergies Allergies  Allergen Reactions  . Flagyl [Metronidazole] Other (See Comments)    Fingers get "numb"     Home Medications  Prior to Admission medications   Medication Sig Start  Date End Date Taking? Authorizing Provider  acetaminophen (TYLENOL) 500 MG tablet Take 1 tablet (500 mg total) by mouth 2 (two) times daily as needed for mild pain or moderate pain. 03/02/15  Yes Leta Baptist, MD  amiodarone (PACERONE) 200 MG tablet Take 1 tablet (200 mg total) by mouth 2 (two) times daily. 09/15/19  Yes Swayze, Ava, DO  amLODipine (NORVASC) 2.5 MG tablet Take 1 tablet (2.5 mg total) by mouth daily. 10/20/19  Yes Mikhail, Nita Sells, DO  apixaban (ELIQUIS) 2.5 MG TABS tablet Take 1 tablet (2.5 mg total) by mouth 2 (two) times daily. 07/01/14  Yes Layne Benton, NP  chlorhexidine (PERIDEX) 0.12 % solution Use as  directed 15 mLs in the mouth or throat 2 (two) times daily. Swish and spit   Yes [provider]  citalopram (CELEXA) 20 MG tablet Take 1 tablet (20 mg total) by mouth daily. 09/15/19  Yes Swayze, Ava, DO  furosemide (LASIX) 20 MG tablet Take 1 tablet (20 mg total) by mouth daily. 10/20/19  Yes Mikhail, Pensions consultant, DO  galantamine (RAZADYNE ER) 8 MG 24 hr capsule Take 8 mg by mouth daily with breakfast.   Yes [provider]  HYDROcodone-acetaminophen (NORCO/VICODIN) 5-325 MG tablet Take 1-2 tablets by mouth every 6 (six) hours as needed for moderate pain. 10/20/19  Yes Mikhail, Maryann, DO  ipratropium-albuterol (DUONEB) 0.5-2.5 (3) MG/3ML SOLN Take 3 mLs by nebulization every 6 (six) hours as needed (shortness of breath).   Yes [provider]  loperamide (IMODIUM) 2 MG capsule Take 2 mg by mouth 3 (three) times daily as needed for diarrhea or loose stools.  07/14/19  Yes [provider]  metoprolol tartrate (LOPRESSOR) 25 MG tablet Take 75 mg by mouth 2 (two) times daily.   Yes [provider]  Multiple Vitamins-Minerals (CERTAVITE SENIOR/ANTIOXIDANT PO) Take 1 tablet by mouth daily.    Yes [provider]  Polyethyl Glycol-Propyl Glycol (SYSTANE) 0.4-0.3 % SOLN Place 1 drop into both eyes every hour as needed (dry eyes/irritation).    Yes [provider]  potassium chloride SA (K-DUR,KLOR-CON) 20 MEQ tablet Take 20 mEq by mouth every other day.   Yes [provider]  psyllium (REGULOID) 0.52 g capsule Take 0.52 g by mouth daily.   Yes [provider]  simvastatin (ZOCOR) 10 MG tablet Take 1 tablet (10 mg total) by mouth daily at 6 PM. 07/01/14  Yes Biby, Jani Files, NP  Tiotropium Bromide-Olodaterol (STIOLTO RESPIMAT) 2.5-2.5 MCG/ACT AERS Inhale 2 puffs into the lungs daily.   Yes [provider]  ziprasidone (GEODON) 20 MG capsule TAKE ONE CAPSULE BY MOUTH DAILY IN THE AFTERNOON **DO NOT CRUSH** **NO REFILLS** Patient taking differently: Take 20 mg by mouth daily at 2 PM.  09/10/19  Yes Cottle, Steva Ready., MD     Critical care time:59mins

## 2019-11-28 NOTE — Progress Notes (Signed)
Pharmacy Antibiotic Note  Yolanda Baker is a 86 y.o. female admitted on 11/27/2019 with symtomatic bradycardia now with fevers.  Pharmacy has been consulted for cefepime and vancomycin dosing.  Tmax of 100.6, wbc normal at 9.3. AKI noted with scr up to 3.7 this morning.   Plan: Vancomycin 1g now follow up labs and renal function tomorrow for redosing. Will consider checking random level prior to redosing if worsents. Goal trough 15-20 mcg/mL. Cefepime 1g q24 hours  Height: 5\' 2"  (157.5 cm) Weight: 50.4 kg (111 lb 1.8 oz) IBW/kg (Calculated) : 50.1  Temp (24hrs), Avg:99.2 F (37.3 C), Min:98.3 F (36.8 C), Max:100.6 F (38.1 C)  Recent Labs  Lab 11/27/19 1341 11/28/19 0113  WBC 9.1 9.3  CREATININE 3.02* 3.73*    Estimated Creatinine Clearance: 7.3 mL/min (A) (by C-G formula based on SCr of 3.73 mg/dL (H)).    Allergies  Allergen Reactions  . Flagyl [Metronidazole] Other (See Comments)    Fingers get "numb"    Thank you for allowing pharmacy to be a part of this patient's care.  01/28/20 PharmD., BCPS Clinical Pharmacist 11/28/2019 2:21 PM

## 2019-11-28 NOTE — Plan of Care (Signed)
Hypoxic into 70s, no improvement with ventimask, now on NRB. Previously had discontinued IVF after orthopnea episode. Saturations in 90s on NRB, HR ~50.   Restarting isoproterenol due to concern for acute cardiogenic pulmonary edema. Will update on call cardiology. CXR STAT Lasix 60mg  IV x 1 dose con't NRB BiPAP if needed; patient not in distress and denies complaints.   , DO 11/28/19 6:16 PM [facility]

## 2019-11-28 NOTE — Progress Notes (Addendum)
PROGRESS NOTE    Yolanda Baker  ZOX:[id number] DOB: 1923/04/27 DOA: 11/27/2019 PCP: Rodrigo Ran, MD   Brief Narrative:    Yolanda Baker is a 86 y.o. female with medical history significant of COPD, atrial fibrillation on Eliquis, with a history of stroke, chronic diastolic congestive heart failure. Patient seen today for generalized weakness brought from facility after feeling tired and found to have low heart rate by physical therapist.  Daughter at bedside [name] she  also wants me to order an x-ray of her right hip for follow-up for healing per orthopedic recommendation.  Patient today is alert awake oriented cooperative and facility is essentially bedbound and needs total assist. In ED: Blood pressure (!) 121/53, pulse (!) 143, temperature 98.9 F (37.2 C), temperature source Oral, resp. rate 14, SpO2 97 %. Pt  Given 1/2 ns 500 ml bolus. cardiology consulted by edmd with rec for stopping metoprolol and amiodarone.   Assessment & Plan:   Active Problems:   Essential hypertension   Atrial flutter (HCC)   Closed femur fracture (HCC)   DNR (do not resuscitate) present on admission   Bradycardia   Afib with symptomatic bradycardia with slow ventricular response, POA - Pt on pacer right now in ed with HR in 40-50's, d/d include med related brady or SSS, or thyroid abnormalities due to amiodarone.  - Continue eliquis.  - Cardiology/PCCM consulted -continue to wean off isoproterenol, home medications discontinued as discussed at admission -TSH elevated but T4 within normal limits, concern for blockade with multiple rate control medications at home and advanced age, continue to hold wean isoproterenol as above and follow clinically.  Lab Results  Component Value Date   TSH 14.929 (H) 11/27/2019   Single low-grade fever, rule out infection  -Patient does not meet sepsis criteria -Cefepime vancomycin initiated overnight for broad-spectrum coverage -Patient denies any overt fevers  chills cough sputum production urinary symptoms including frequency hesitancy urgency or burning -UA appears to be a dirty catch, continue to follow cultures -Follow procalcitonin if negative would discontinue antibiotics given patient's lack of symptoms or complaints.  Closed rt femur fracture, POA: Pt had rt hip fracture in march -stable, patient is essentially nonambulatory after fracture   DVT prophylaxis:  Pt is on Eliquis.   Code Status:  DNR  Consults called:  Cardiology  PCCM   DVT prophylaxis: Eliquis Code Status: DNR Family Communication: Daughter at bedside  Status is: Inpatient  Dispo: The patient is from: SNF  Anticipated d/c is to: SNF  Anticipated d/c date is: 2-3 days  Patient currently is not medically stable to d/c, given need for recent pacing, further evaluation and medication management with PCCM, cardiology and possible need for procedure if patient's heart rate does not improve adequately  Subjective: No acute issues or events overnight, patient feels quite well, thankful her external pacer wires have been turned off but otherwise denies nausea, vomiting, diarrhea, constipation, headache, fever, chills.  Objective: Vitals:   11/28/19 0535 11/28/19 0600 11/28/19 0630 11/28/19 0700  BP: 112/75 (!) 129/46 (!) 100/50 (!) 125/41  Pulse: 65 64 61 62  Resp: 17 19 (!) 22 (!) 24  Temp:      TempSrc:      SpO2: 96% 96% 97% 95%  Weight:      Height:        Intake/Output Summary (Last 24 hours) at 11/28/2019 0753 Last data filed at 11/28/2019 0700 Gross per 24 hour  Intake 2008.38 ml  Output 0 ml  Net 2008.38 ml   Filed Weights   11/27/19 2111 11/27/19 2306  Weight: 49.7 kg 50.4 kg    Examination:  General:  Pleasantly resting in bed, No acute distress. HEENT:  Normocephalic atraumatic.  Sclerae nonicteric, noninjected.  Extraocular movements intact bilaterally. Neck:  Without mass or deformity.  Trachea is  midline. Lungs:  Clear to auscultate bilaterally without rhonchi, wheeze, or rales. Heart: Irregularly irregular, bradycardic into the 50s.  Without murmurs, rubs, or gallops. Abdomen:  Soft, nontender, nondistended.  Without guarding or rebound. Extremities: Without cyanosis, clubbing, edema, or obvious deformity. Vascular:  Dorsalis pedis and posterior tibial pulses palpable bilaterally. Skin:  Warm and dry, no erythema, no ulcerations.    Data Reviewed: I have personally reviewed following labs and imaging studies  CBC: Recent Labs  Lab 11/27/19 1341 11/28/19 0113  WBC 9.1 9.3  NEUTROABS 6.0  --   HGB 10.8* 10.4*  HCT 33.3* 32.7*  MCV 97.7 97.3  PLT 232 206   Basic Metabolic Panel: Recent Labs  Lab 11/27/19 1341 11/28/19 0113  NA 138 138  K 3.6 3.4*  CL 101 102  CO2 26 24  GLUCOSE 103* 136*  BUN 31* 40*  CREATININE 3.02* 3.73*  CALCIUM 8.3* 8.5*  MG 1.9  --    GFR: Estimated Creatinine Clearance: 7.3 mL/min (A) (by C-G formula based on SCr of 3.73 mg/dL (H)). Liver Function Tests: Recent Labs  Lab 11/28/19 0113  AST 46*  ALT 33  ALKPHOS 64  BILITOT 0.6  PROT 6.3*  ALBUMIN 3.2*   No results for input(s): LIPASE, AMYLASE in the last 168 hours. No results for input(s): AMMONIA in the last 168 hours. Coagulation Profile: No results for input(s): INR, PROTIME in the last 168 hours. Cardiac Enzymes: Recent Labs  Lab 11/28/19 0113  CKTOTAL 54   BNP (last 3 results) No results for input(s): PROBNP in the last 8760 hours. HbA1C: No results for input(s): HGBA1C in the last 72 hours. CBG: Recent Labs  Lab 11/27/19 2249  GLUCAP 131*   Lipid Profile: No results for input(s): CHOL, HDL, LDLCALC, TRIG, CHOLHDL, LDLDIRECT in the last 72 hours. Thyroid Function Tests: Recent Labs    11/27/19 1916  TSH 14.929*  FREET4 0.97   Anemia Panel: Recent Labs    11/27/19 1916  VITAMINB12 483  FOLATE 55.5  FERRITIN 102  TIBC 316  IRON 42  RETICCTPCT  1.2   Sepsis Labs: No results for input(s): PROCALCITON, LATICACIDVEN in the last 168 hours.  Recent Results (from the past 240 hour(s))  Respiratory Panel by RT PCR (Flu A&B, Covid) - Nasopharyngeal Swab     Status: None   Collection Time: 11/27/19  2:09 PM   Specimen: Nasopharyngeal Swab  Result Value Ref Range Status   SARS Coronavirus 2 by RT PCR NEGATIVE NEGATIVE Final    Comment: (NOTE) SARS-CoV-2 target nucleic acids are NOT DETECTED.  The SARS-CoV-2 RNA is generally detectable in upper respiratoy specimens during the acute phase of infection. The lowest concentration of SARS-CoV-2 viral copies this assay can detect is 131 copies/mL. A negative result does not preclude SARS-Cov-2 infection and should not be used as the sole basis for treatment or other patient management decisions. A negative result may occur with  improper specimen collection/handling, submission of specimen other than nasopharyngeal swab, presence of viral mutation(s) within the areas targeted by this assay, and inadequate number of viral copies (<131 copies/mL). A negative result must be combined with clinical observations, patient history, and  epidemiological information. The expected result is Negative.  Fact Sheet for Patients:  https://www.moore.com/  Fact Sheet for Healthcare Providers:  https://www.young.biz/  This test is no t yet approved or cleared by the [place] FDA and  has been authorized for detection and/or diagnosis of SARS-CoV-2 by FDA under an Emergency Use Authorization (EUA). This EUA will remain  in effect (meaning this test can be used) for the duration of the COVID-19 declaration under Section 564(b)(1) of the Act, 21 U.S.C. section 360bbb-3(b)(1), unless the authorization is terminated or revoked sooner.     Influenza A by PCR NEGATIVE NEGATIVE Final   Influenza B by PCR NEGATIVE NEGATIVE Final    Comment: (NOTE) The Xpert Xpress  SARS-CoV-2/FLU/RSV assay is intended as an aid in  the diagnosis of influenza from Nasopharyngeal swab specimens and  should not be used as a sole basis for treatment. Nasal washings and  aspirates are unacceptable for Xpert Xpress SARS-CoV-2/FLU/RSV  testing.  Fact Sheet for Patients: https://www.moore.com/  Fact Sheet for Healthcare Providers: https://www.young.biz/  This test is not yet approved or cleared by the [place] FDA and  has been authorized for detection and/or diagnosis of SARS-CoV-2 by  FDA under an Emergency Use Authorization (EUA). This EUA will remain  in effect (meaning this test can be used) for the duration of the  Covid-19 declaration under Section 564(b)(1) of the Act, 21  U.S.C. section 360bbb-3(b)(1), unless the authorization is  terminated or revoked. Performed at [facility], [address] [place], [place] [address]   MRSA PCR Screening     Status: None   Collection Time: 11/28/19  2:36 AM   Specimen: Nasal Mucosa; Nasopharyngeal  Result Value Ref Range Status   MRSA by PCR NEGATIVE NEGATIVE Final    Comment:        The GeneXpert MRSA Assay (FDA approved for NASAL specimens only), is one component of a comprehensive MRSA colonization surveillance program. It is not intended to diagnose MRSA infection nor to guide or monitor treatment for MRSA infections. Performed at [facility], [address] [place], [place] [address]     Radiology Studies: DG Chest Portable 1 View  Result Date: 11/27/2019 CLINICAL DATA:  Shortness of breath.  Atrial fibrillation EXAM: PORTABLE CHEST 1 VIEW COMPARISON:  October 16, 2019 chest radiograph and CT angiogram chest FINDINGS: Lungs are clear. There is cardiomegaly with pulmonary vascularity normal. No adenopathy. No pneumothorax. No bone lesions. IMPRESSION: Cardiomegaly.  Lungs clear. Electronically Signed   By: Bretta Bang III M.D.   On: 11/27/2019  13:39   Scheduled Meds: . apixaban  2.5 mg Oral BID  . Chlorhexidine Gluconate Cloth  6 each Topical Q0600  . citalopram  20 mg Oral Daily  . fentaNYL (SUBLIMAZE) injection  25 mcg Intravenous Once  . psyllium  1 packet Oral Daily  . ziprasidone  20 mg Oral Q1400   Continuous Infusions: . isoproterenol (ISUPREL) infusion 5 mcg/min (11/28/19 0700)  . lactated ringers 75 mL/hr at 11/28/19 0700     LOS: 0 days   Time spent:  Azucena Fallen, DO [facility]  If 7PM-7AM, please contact night-coverage [email or web address]  11/28/2019, 7:53 AM

## 2019-11-29 DIAGNOSIS — R001 Bradycardia, unspecified: Secondary | ICD-10-CM | POA: Diagnosis not present

## 2019-11-29 DIAGNOSIS — I4892 Unspecified atrial flutter: Secondary | ICD-10-CM | POA: Diagnosis not present

## 2019-11-29 DIAGNOSIS — I4891 Unspecified atrial fibrillation: Secondary | ICD-10-CM

## 2019-11-29 DIAGNOSIS — I10 Essential (primary) hypertension: Secondary | ICD-10-CM | POA: Diagnosis not present

## 2019-11-29 DIAGNOSIS — J9601 Acute respiratory failure with hypoxia: Secondary | ICD-10-CM

## 2019-11-29 DIAGNOSIS — J810 Acute pulmonary edema: Secondary | ICD-10-CM

## 2019-11-29 DIAGNOSIS — S728X1D Other fracture of right femur, subsequent encounter for closed fracture with routine healing: Secondary | ICD-10-CM | POA: Diagnosis not present

## 2019-11-29 LAB — BASIC METABOLIC PANEL
Anion gap: 13 (ref 5–15)
BUN: 33 mg/dL — ABNORMAL HIGH (ref 8–23)
CO2: 24 mmol/L (ref 22–32)
Calcium: 8.3 mg/dL — ABNORMAL LOW (ref 8.9–10.3)
Chloride: 100 mmol/L (ref 98–111)
Creatinine, Ser: 2.24 mg/dL — ABNORMAL HIGH (ref 0.44–1.00)
GFR calc Af Amer: 21 mL/min — ABNORMAL LOW (ref >60)
GFR calc non Af Amer: 18 mL/min — ABNORMAL LOW (ref >60)
Glucose, Bld: 139 mg/dL — ABNORMAL HIGH (ref 70–99)
Potassium: 3.5 mmol/L (ref 3.5–5.1)
Sodium: 137 mmol/L (ref 135–145)

## 2019-11-29 LAB — CBC
HCT: 31.5 % — ABNORMAL LOW (ref 36.0–46.0)
Hemoglobin: 10.3 g/dL — ABNORMAL LOW (ref 12.0–15.0)
MCH: 31.2 pg (ref 26.0–34.0)
MCHC: 32.7 g/dL (ref 30.0–36.0)
MCV: 95.5 fL (ref 80.0–100.0)
Platelets: 207 10*3/uL (ref 150–400)
RBC: 3.3 MIL/uL — ABNORMAL LOW (ref 3.87–5.11)
RDW: 15.8 % — ABNORMAL HIGH (ref 11.5–15.5)
WBC: 15.7 10*3/uL — ABNORMAL HIGH (ref 4.0–10.5)
nRBC: 0 % (ref 0.0–0.2)

## 2019-11-29 LAB — PROCALCITONIN: Procalcitonin: 0.16 ng/mL

## 2019-11-29 MED ORDER — POTASSIUM CHLORIDE CRYS ER 20 MEQ PO TBCR
40.0000 meq | EXTENDED_RELEASE_TABLET | Freq: Once | ORAL | Status: AC
  Administered 2019-11-29: 40 meq via ORAL
  Filled 2019-11-29: qty 2

## 2019-11-29 MED ORDER — VANCOMYCIN HCL 500 MG/100ML IV SOLN: 500.0000 mg | INTRAVENOUS | Status: DC

## 2019-11-29 MED ORDER — HYDRALAZINE HCL 25 MG PO TABS
25.0000 mg | ORAL_TABLET | Freq: Three times a day (TID) | ORAL | Status: DC
  Administered 2019-11-29 – 2019-12-03 (×13): 25 mg via ORAL
  Filled 2019-11-29 (×13): qty 1

## 2019-11-29 MED ORDER — FUROSEMIDE 10 MG/ML IJ SOLN
40.0000 mg | Freq: Two times a day (BID) | INTRAMUSCULAR | Status: DC
  Administered 2019-11-29 – 2019-12-01 (×5): 40 mg via INTRAVENOUS
  Filled 2019-11-29 (×5): qty 4

## 2019-11-29 MED ORDER — POTASSIUM CHLORIDE 20 MEQ PO PACK: 40.0000 meq | PACK | Freq: Once | ORAL | Status: DC

## 2019-11-29 MED ORDER — HYDRALAZINE HCL 20 MG/ML IJ SOLN: 5.0000 mg | Freq: Four times a day (QID) | INTRAMUSCULAR | Status: DC | PRN

## 2019-11-29 MED ORDER — ORAL CARE MOUTH RINSE
15.0000 mL | Freq: Two times a day (BID) | OROMUCOSAL | Status: DC
  Administered 2019-11-29 – 2019-12-01 (×4): 15 mL via OROMUCOSAL

## 2019-11-29 MED ORDER — SODIUM CHLORIDE 0.9 % IV SOLN
2.0000 g | INTRAVENOUS | Status: DC
  Administered 2019-11-29: 2 g via INTRAVENOUS
  Filled 2019-11-29: qty 2

## 2019-11-29 NOTE — Progress Notes (Addendum)
Progress Note  Patient Name: Yolanda Baker Date of Encounter: 11/29/2019  [facility] Cardiologist: Chilton Si, MD   Subjective   Patient complains of mild dyspnea.  She denies chest pain.    Inpatient Medications    Scheduled Meds: . apixaban  2.5 mg Oral BID  . Chlorhexidine Gluconate Cloth  6 each Topical Q0600  . citalopram  20 mg Oral Daily  . fentaNYL (SUBLIMAZE) injection  25 mcg Intravenous Once  . mouth rinse  15 mL Mouth Rinse BID  . psyllium  1 packet Oral Daily  . vancomycin variable dose per unstable renal function (pharmacist dosing)   Does not apply See admin instructions  . ziprasidone  20 mg Oral Q1400   Continuous Infusions: . ceFEPime (MAXIPIME) IV Stopped (11/29/19 0047)  . isoproterenol (ISUPREL) infusion 1 mcg/min (11/29/19 0700)  . nitroGLYCERIN 15 mcg/min (11/29/19 0700)   PRN Meds: ipratropium-albuterol, polyvinyl alcohol   Vital Signs    Vitals:   11/29/19 0530 11/29/19 0600 11/29/19 0630 11/29/19 0700  BP: (!) 151/62 (!) 150/66 (!) 153/69 (!) 168/73  Pulse: 70 71 70 73  Resp: (!) 23 19 (!) 25 (!) 23  Temp:      TempSrc:      SpO2: 95% 92% 94% 92%  Weight:      Height:        Intake/Output Summary (Last 24 hours) at 11/29/2019 0719 Last data filed at 11/29/2019 0700 Gross per 24 hour  Intake 2151.25 ml  Output 800 ml  Net 1351.25 ml   Last 3 Weights 11/29/2019 11/27/2019 11/27/2019  Weight (lbs) 111 lb 12.4 oz 111 lb 1.8 oz 109 lb 9.1 oz  Weight (kg) 50.7 kg 50.4 kg 49.7 kg      Telemetry    NSR- Personally Reviewed   Physical Exam   GEN: No acute distress. Frail   Neck: supple Cardiac: RRR Respiratory: Diminished BS bases GI: Soft, nontender, non-distended  MS: No edema; ecchymoses noted Neuro:  Nonfocal  Psych: Normal affect   Labs    High Sensitivity Troponin:   Recent Labs  Lab 11/27/19 1341 11/27/19 1544  TROPONINIHS 17 17      Chemistry Recent Labs  Lab 11/27/19 1341 11/28/19 0113  11/29/19 0554  NA 138 138 137  K 3.6 3.4* 3.5  CL 101 102 100  CO2 26 24 24   GLUCOSE 103* 136* 139*  BUN 31* 40* 33*  CREATININE 3.02* 3.73* 2.24*  CALCIUM 8.3* 8.5* 8.3*  PROT  --  6.3*  --   ALBUMIN  --  3.2*  --   AST  --  46*  --   ALT  --  33  --   ALKPHOS  --  64  --   BILITOT  --  0.6  --   GFRNONAA 13* 10* 18*  GFRAA 15* 11* 21*  ANIONGAP 11 12 13      Hematology Recent Labs  Lab 11/27/19 1341 11/27/19 1916 11/28/19 0113 11/29/19 0554  WBC 9.1  --  9.3 15.7*  RBC 3.41* 3.44* 3.36* 3.30*  HGB 10.8*  --  10.4* 10.3*  HCT 33.3*  --  32.7* 31.5*  MCV 97.7  --  97.3 95.5  MCH 31.7  --  31.0 31.2  MCHC 32.4  --  31.8 32.7  RDW 16.0*  --  16.0* 15.8*  PLT 232  --  206 207    Radiology    DG CHEST PORT 1 VIEW  Result Date: 11/28/2019 CLINICAL  DATA:  Hypoxemia EXAM: PORTABLE CHEST 1 VIEW COMPARISON:  11/27/2019 FINDINGS: There is new diffuse interstitial pulmonary opacity and layering pleural effusions with associated atelectasis or consolidation. Cardiomegaly. IMPRESSION: New diffuse interstitial pulmonary opacity and layering pleural effusions with associated atelectasis or consolidation, consistent with edema in the setting of cardiomegaly. Electronically Signed   By: Lauralyn Primes M.D.   On: 11/28/2019 18:52   DG Chest Portable 1 View  Result Date: 11/27/2019 CLINICAL DATA:  Shortness of breath.  Atrial fibrillation EXAM: PORTABLE CHEST 1 VIEW COMPARISON:  October 16, 2019 chest radiograph and CT angiogram chest FINDINGS: Lungs are clear. There is cardiomegaly with pulmonary vascularity normal. No adenopathy. No pneumothorax. No bone lesions. IMPRESSION: Cardiomegaly.  Lungs clear. Electronically Signed   By: Bretta Bang III M.D.   On: 11/27/2019 13:39     Patient Profile     86 year old female with past medical history of paroxysmal atrial fibrillation, prior CVA, hypertension, mild dementia admitted with bradycardia.  Most recent echocardiogram July 2021  showed ejection fraction 45 to 50%, mild left ventricular hypertrophy, moderate left atrial enlargement, mild right atrial enlargement, mild mitral regurgitation, moderate tricuspid regurgitation.  Assessment & Plan    1 acute combined systolic/diastolic congestive heart failure-chest x-ray shows bilateral pleural effusions.  Saturations have decreased. I/O+[id number].  We will give Lasix 40 mg IV twice daily.  Follow renal function closely.  2 bradycardia-resolved after holding amiodarone and metoprolol.  Heart rate now in the 60s and 70s.  We will continue to hold today.  Could potentially resume low-dose amiodarone tomorrow if heart rate stable to prevent recurrent atrial fibrillation as apparently she was very symptomatic in the past.  Discontinue isoproterenol.   3 paroxysmal atrial fibrillation-possibly resume low-dose amiodarone tomorrow pending heart rate.  Continue apixaban.  4 acute kidney disease-creatinine somewhat improved today but volume overloaded.  We will begin Lasix and follow.  5 hypertension-BP elevated; will add hydralazine 25 mg TID; can wean NTG to imdur as BP allows.  6 NCB  7 Fever-Antibiotics initiated per CCM; FU cultures.  For questions or updates, please contact [facility] Please consult [email or web address] for contact info under        Signed, Olga Millers, MD  11/29/2019, 7:19 AM

## 2019-11-29 NOTE — Progress Notes (Signed)
PROGRESS NOTE    Yolanda Baker  VOH:[id number] DOB: 1923/08/03 DOA: 11/27/2019 PCP: Rodrigo Ran, MD   Brief Narrative:    Yolanda Baker is a 86 y.o. female with medical history significant of COPD, atrial fibrillation on Eliquis, with a history of stroke, chronic diastolic congestive heart failure. Patient seen today for generalized weakness brought from facility after feeling tired and found to have low heart rate by physical therapist.  Yolanda Baker at bedside Yolanda Baker she also wants me to order an x-ray of her right hip for follow-up for healing per orthopedic recommendation.  Patient today is alert awake oriented cooperative and facility is essentially bedbound and needs total assist. In ED: Blood pressure (!) 121/53, pulse (!) 143, temperature 98.9 F (37.2 C), temperature source Oral, resp. rate 14, SpO2 97 %. Pt  Given 1/2 ns 500 ml bolus. cardiology consulted by edmd with rec for stopping metoprolol and amiodarone.  Patient evaluated, ICU took patient to their service given ongoing need for isoproterenol, acute hypoxia and worsening volume status concerning for heart failure exacerbation.  Will remain available consult for once patient is stable for ICU discharge would certainly take her back to my service thank you.   Assessment & Plan:   Active Problems:   Essential hypertension   Atrial flutter (HCC)   Closed femur fracture (HCC)   DNR (do not resuscitate) present on admission   Bradycardia   Afib with symptomatic bradycardia with slow ventricular response, POA - Pt on pacer right now in ed with HR in 40-50's, d/d include med related brady or SSS, or thyroid abnormalities due to amiodarone.  - Continue eliquis.  - Cardiology/PCCM consulted -continue to wean off isoproterenol, home medications discontinued as discussed at admission -TSH elevated but T4 within normal limits, concern for blockade with multiple rate control medications at home and advanced age, continue to hold wean  isoproterenol as above and follow clinically.  Lab Results  Component Value Date   TSH 14.929 (H) 11/27/2019   Chronic heart failure - EF 45-50% with global hypokinesis 7/21 - Likely acute volume overload or potentially worsening EF in the setting of above  Single low-grade fever, rule out infection  -Patient does not meet sepsis criteria -Cefepime vancomycin initiated overnight for broad-spectrum coverage -Patient denies any overt fevers chills cough sputum production urinary symptoms including frequency hesitancy urgency or burning -UA appears to be a dirty catch, continue to follow cultures -Follow procalcitonin if negative would discontinue antibiotics given patient's lack of symptoms or complaints.  Closed rt femur fracture, POA: Pt had rt hip fracture in march -stable, patient is essentially nonambulatory after fracture   DVT prophylaxis:  Pt is on Eliquis.   Code Status:  DNR  Consults called:  Cardiology  PCCM   DVT prophylaxis: Eliquis Code Status: DNR Family Communication: Yolanda Baker at bedside  Status is: Inpatient  Dispo: The patient is from: SNF  Anticipated d/c is to: SNF  Anticipated d/c date is: To be determined pending clinical course  Patient currently is not medically stable to d/c, given need for recent pacing, further evaluation and medication management with PCCM, cardiology and possible need for procedure if patient's heart rate does not improve adequately  Subjective: Acute hypoxic event overnight requiring nonrebreather, currently weaned down to 4 L nasal cannula on [place].  Patient review of systems is somewhat limited but denies any overt symptoms of dyspnea, chest pain, nausea, vomiting, diarrhea, constipation, headache, fever, chills.  Objective: Vitals:   11/29/19 0530 11/29/19  0600 11/29/19 0630 11/29/19 0700  BP: (!) 151/62 (!) 150/66 (!) 153/69 (!) 168/73  Pulse: 70 71 70 73  Resp: (!) 23 19 (!) 25  (!) 23  Temp:      TempSrc:      SpO2: 95% 92% 94% 92%  Weight:      Height:        Intake/Output Summary (Last 24 hours) at 11/29/2019 0717 Last data filed at 11/29/2019 0700 Gross per 24 hour  Intake 2151.25 ml  Output 800 ml  Net 1351.25 ml   Filed Weights   11/27/19 2111 11/27/19 2306 11/29/19 0115  Weight: 49.7 kg 50.4 kg 50.7 kg    Examination:  General:  Pleasantly resting in bed, No acute distress. HEENT:  Normocephalic atraumatic.  Sclerae nonicteric, noninjected.  Extraocular movements intact bilaterally. Neck:  Without mass or deformity.  Trachea is midline. Lungs:  Clear to auscultate bilaterally without rhonchi, wheeze, or rales. Heart: Irregularly irregular, bradycardic into the 50s.  Without murmurs, rubs, or gallops. Abdomen:  Soft, nontender, nondistended.  Without guarding or rebound. Extremities: Without cyanosis, clubbing, edema, or obvious deformity. Vascular:  Dorsalis pedis and posterior tibial pulses palpable bilaterally. Skin:  Warm and dry, no erythema, no ulcerations.    Data Reviewed: I have personally reviewed following labs and imaging studies  CBC: Recent Labs  Lab 11/27/19 1341 11/28/19 0113 11/29/19 0554  WBC 9.1 9.3 15.7*  NEUTROABS 6.0  --   --   HGB 10.8* 10.4* 10.3*  HCT 33.3* 32.7* 31.5*  MCV 97.7 97.3 95.5  PLT 232 206 207   Basic Metabolic Panel: Recent Labs  Lab 11/27/19 1341 11/28/19 0113 11/29/19 0554  NA 138 138 137  K 3.6 3.4* 3.5  CL 101 102 100  CO2 26 24 24   GLUCOSE 103* 136* 139*  BUN 31* 40* 33*  CREATININE 3.02* 3.73* 2.24*  CALCIUM 8.3* 8.5* 8.3*  MG 1.9  --   --    GFR: Estimated Creatinine Clearance: 12.1 mL/min (A) (by C-G formula based on SCr of 2.24 mg/dL (H)). Liver Function Tests: Recent Labs  Lab 11/28/19 0113  AST 46*  ALT 33  ALKPHOS 64  BILITOT 0.6  PROT 6.3*  ALBUMIN 3.2*   No results for input(s): LIPASE, AMYLASE in the last 168 hours. No results for input(s): AMMONIA in the  last 168 hours. Coagulation Profile: No results for input(s): INR, PROTIME in the last 168 hours. Cardiac Enzymes: Recent Labs  Lab 11/28/19 0113  CKTOTAL 54   BNP (last 3 results) No results for input(s): PROBNP in the last 8760 hours. HbA1C: No results for input(s): HGBA1C in the last 72 hours. CBG: Recent Labs  Lab 11/27/19 2249  GLUCAP 131*   Lipid Profile: No results for input(s): CHOL, HDL, LDLCALC, TRIG, CHOLHDL, LDLDIRECT in the last 72 hours. Thyroid Function Tests: Recent Labs    11/27/19 1916  TSH 14.929*  FREET4 0.97   Anemia Panel: Recent Labs    11/27/19 1916  VITAMINB12 483  FOLATE 55.5  FERRITIN 102  TIBC 316  IRON 42  RETICCTPCT 1.2   Sepsis Labs: No results for input(s): PROCALCITON, LATICACIDVEN in the last 168 hours.  Recent Results (from the past 240 hour(s))  Respiratory Panel by RT PCR (Flu A&B, Covid) - Nasopharyngeal Swab     Status: None   Collection Time: 11/27/19  2:09 PM   Specimen: Nasopharyngeal Swab  Result Value Ref Range Status   SARS Coronavirus 2 by RT PCR NEGATIVE  NEGATIVE Final    Comment: (NOTE) SARS-CoV-2 target nucleic acids are NOT DETECTED.  The SARS-CoV-2 RNA is generally detectable in upper respiratoy specimens during the acute phase of infection. The lowest concentration of SARS-CoV-2 viral copies this assay can detect is 131 copies/mL. A negative result does not preclude SARS-Cov-2 infection and should not be used as the sole basis for treatment or other patient management decisions. A negative result may occur with  improper specimen collection/handling, submission of specimen other than nasopharyngeal swab, presence of viral mutation(s) within the areas targeted by this assay, and inadequate number of viral copies (<131 copies/mL). A negative result must be combined with clinical observations, patient history, and epidemiological information. The expected result is Negative.  Fact Sheet for Patients:    https://www.moore.com/  Fact Sheet for Healthcare Providers:  https://www.young.biz/  This test is no t yet approved or cleared by the [place] FDA and  has been authorized for detection and/or diagnosis of SARS-CoV-2 by FDA under an Emergency Use Authorization (EUA). This EUA will remain  in effect (meaning this test can be used) for the duration of the COVID-19 declaration under Section 564(b)(1) of the Act, 21 U.S.C. section 360bbb-3(b)(1), unless the authorization is terminated or revoked sooner.     Influenza A by PCR NEGATIVE NEGATIVE Final   Influenza B by PCR NEGATIVE NEGATIVE Final    Comment: (NOTE) The Xpert Xpress SARS-CoV-2/FLU/RSV assay is intended as an aid in  the diagnosis of influenza from Nasopharyngeal swab specimens and  should not be used as a sole basis for treatment. Nasal washings and  aspirates are unacceptable for Xpert Xpress SARS-CoV-2/FLU/RSV  testing.  Fact Sheet for Patients: https://www.moore.com/  Fact Sheet for Healthcare Providers: https://www.young.biz/  This test is not yet approved or cleared by the [place] FDA and  has been authorized for detection and/or diagnosis of SARS-CoV-2 by  FDA under an Emergency Use Authorization (EUA). This EUA will remain  in effect (meaning this test can be used) for the duration of the  Covid-19 declaration under Section 564(b)(1) of the Act, 21  U.S.C. section 360bbb-3(b)(1), unless the authorization is  terminated or revoked. Performed at [facility], [address] [place], [place] [address]   MRSA PCR Screening     Status: None   Collection Time: 11/28/19  2:36 AM   Specimen: Nasal Mucosa; Nasopharyngeal  Result Value Ref Range Status   MRSA by PCR NEGATIVE NEGATIVE Final    Comment:        The GeneXpert MRSA Assay (FDA approved for NASAL specimens only), is one component of a comprehensive MRSA  colonization surveillance program. It is not intended to diagnose MRSA infection nor to guide or monitor treatment for MRSA infections. Performed at [facility], [address], [place], [place] [address]     Radiology Studies: DG CHEST PORT 1 VIEW  Result Date: 11/28/2019 CLINICAL DATA:  Hypoxemia EXAM: PORTABLE CHEST 1 VIEW COMPARISON:  11/27/2019 FINDINGS: There is new diffuse interstitial pulmonary opacity and layering pleural effusions with associated atelectasis or consolidation. Cardiomegaly. IMPRESSION: New diffuse interstitial pulmonary opacity and layering pleural effusions with associated atelectasis or consolidation, consistent with edema in the setting of cardiomegaly. Electronically Signed   By: Lauralyn Primes M.D.   On: 11/28/2019 18:52   DG Chest Portable 1 View  Result Date: 11/27/2019 CLINICAL DATA:  Shortness of breath.  Atrial fibrillation EXAM: PORTABLE CHEST 1 VIEW COMPARISON:  October 16, 2019 chest radiograph and CT  angiogram chest FINDINGS: Lungs are clear. There is cardiomegaly with pulmonary vascularity normal. No adenopathy. No pneumothorax. No bone lesions. IMPRESSION: Cardiomegaly.  Lungs clear. Electronically Signed   By: Bretta Bang III M.D.   On: 11/27/2019 13:39   Scheduled Meds: . apixaban  2.5 mg Oral BID  . Chlorhexidine Gluconate Cloth  6 each Topical Q0600  . citalopram  20 mg Oral Daily  . fentaNYL (SUBLIMAZE) injection  25 mcg Intravenous Once  . mouth rinse  15 mL Mouth Rinse BID  . psyllium  1 packet Oral Daily  . vancomycin variable dose per unstable renal function (pharmacist dosing)   Does not apply See admin instructions  . ziprasidone  20 mg Oral Q1400   Continuous Infusions: . ceFEPime (MAXIPIME) IV Stopped (11/29/19 0047)  . isoproterenol (ISUPREL) infusion 1 mcg/min (11/29/19 0700)  . nitroGLYCERIN 15 mcg/min (11/29/19 0700)     LOS: 1 day   Time spent:  Azucena Fallen, DO [facility]  If 7PM-7AM,  please contact night-coverage [email or web address]  11/29/2019, 7:17 AM

## 2019-11-29 NOTE — Progress Notes (Signed)
Yolanda Baker, MRN:  [id number], DOB:  September 23, 1923, LOS: 1 ADMISSION DATE:  11/27/2019, CONSULTATION DATE: 11/27/19 REFERRING MD: Dr Allena Katz, CHIEF COMPLAINT:  bradycardia  Brief History   86 y/o female with symptomatic bradycardia   History of present illness   This is a 86y/o female that presented to the ER today for bradycardia.  Pt was seen by PT today and heart rate was low so was sent to ER.  On arrival the pt was found to be bradycardic but with a normal bp.  She was admitted to PCU with  percutaneous pacing set at 50bpm.  CCM was called to bedside on arrival to the PCU.  Pt is awake and alert.  She denies discomfort despite being paced.  Underlying rhythm is complete heart block at 20bpm.  She denies chest pain/chest pressure/ SOB/palpitations once the pacer was decreased.  Past Medical History  CHF - chronic diastolic Afib COPD HTN  Significant Hospital Events     Consults:  Cardiology  Procedures:  none  Significant Diagnostic Tests:  UA (straight cath) <5 WBCs  Micro Data:  Blood cx 10/2> Urine cx 10/2>  Antimicrobials:  vanc > 10/2 Cefepime >10/2  Interval history:  Required isoproterenol to be restarted due to progressive heart failure yesterday.  Heart rate maintaining around 60 without isoproterenol today.  Objective   Blood pressure (!) 158/87, pulse 63, temperature 99.7 F (37.6 C), temperature source Axillary, resp. rate (!) 24, height 5\' 2"  (1.575 m), weight 50.7 kg, SpO2 95 %.    FiO2 (%):  [100 %] 100 %   Intake/Output Summary (Last 24 hours) at 11/29/2019 1056 Last data filed at 11/29/2019 0800 Gross per 24 hour  Intake 2162.75 ml  Output 900 ml  Net 1262.75 ml   Filed Weights   11/27/19 2111 11/27/19 2306 11/29/19 0115  Weight: 49.7 kg 50.4 kg 50.7 kg    Examination: General: Frail appearing elderly woman sitting in bed in no acute distress HENT: [place]/AT, eyes anicteric Lungs: Breathing comfortably on 3 L nasal cannula.  Reduced  basilar breath sounds, faint rales posteriorly.  Mild tachypnea, no accessory muscle use. Cardiovascular: Regular rate, irregular rhythm Abdomen: Soft, nontender, nondistended Extremities: No significant peripheral edema, no clubbing Neuro: Resting tremors.  Awake and alert.  Globally weak. GU: Pure wick Derm: Bruises and abrasions on shins.  No rashes.  Assessment & Plan:  Symptomatic bradycardia 2/2 amiodarone and lopressor use as an outpatient.  Unsure if her AKI contributed to medication toxicity or if AKI is due to hypoperfusion. Chronic HFpEF  Chronic atrial fibrillation; previously had converted to complete heart block -Continue to hold isoproterenol.  With resolution of complete heart block may be able tolerate resuming amiodarone tomorrow per cardiology -Continue monitoring in the ICU given high risk for deterioration and need for drips to resume. -Continue Eliquis -Diuresis  Hypertension -Hydralazine -Wean off nitroglycerin  AKI-unclear chronicity, possibly related to hypoperfusion from low output with bradycardia.  Improving. -Strict I/O  COPD--unsure where this history comes in; she is a never smoker -No intervention  Fever--concern for infection of unknown etiology -Continue empiric antibiotics -Follow-up blood cultures.  UA not suggestive of acute infection    Best practice:  Diet: soft  Pain/Anxiety/Delirium protocol (if indicated): fentanyl as needed VAP protocol (if indicated): n/a DVT prophylaxis: heparin sq GI prophylaxis:  Glucose control:  Mobility: bedrest Code Status:DNR Family Communication: daughter 01/29/20 updated via phone Disposition: ICU  Labs   CBC: Recent Labs  Lab 11/27/19  1341 11/28/19 0113 11/29/19 0554  WBC 9.1 9.3 15.7*  NEUTROABS 6.0  --   --   HGB 10.8* 10.4* 10.3*  HCT 33.3* 32.7* 31.5*  MCV 97.7 97.3 95.5  PLT 232 206 207    Basic Metabolic Panel: Recent Labs  Lab 11/27/19 1341 11/28/19 0113 11/29/19 0554  NA 138  138 137  K 3.6 3.4* 3.5  CL 101 102 100  CO2 26 24 24   GLUCOSE 103* 136* 139*  BUN 31* 40* 33*  CREATININE 3.02* 3.73* 2.24*  CALCIUM 8.3* 8.5* 8.3*  MG 1.9  --   --    GFR: Estimated Creatinine Clearance: 12.1 mL/min (A) (by C-G formula based on SCr of 2.24 mg/dL (H)). Recent Labs  Lab 11/27/19 1341 11/28/19 0113 11/29/19 0554  PROCALCITON  --   --  0.16  WBC 9.1 9.3 15.7*    Liver Function Tests: Recent Labs  Lab 11/28/19 0113  AST 46*  ALT 33  ALKPHOS 64  BILITOT 0.6  PROT 6.3*  ALBUMIN 3.2*   No results for input(s): LIPASE, AMYLASE in the last 168 hours. No results for input(s): AMMONIA in the last 168 hours.  ABG    Component Value Date/Time   PHART 7.520 (H) 10/16/2019 1600   PCO2ART 34.1 10/16/2019 1600   PO2ART 90 10/16/2019 1600   HCO3 27.8 10/16/2019 1600   TCO2 29 10/16/2019 1600   ACIDBASEDEF 1.0 09/08/2006 1630   O2SAT 98.0 10/16/2019 1600     Coagulation Profile: No results for input(s): INR, PROTIME in the last 168 hours.  Cardiac Enzymes: Recent Labs  Lab 11/28/19 0113  CKTOTAL 54    HbA1C: Hgb A1c MFr Bld  Date/Time Value Ref Range Status  06/30/2014 02:46 AM 5.9 (H) 4.8 - 5.6 % Final    Comment:    (NOTE)         Pre-diabetes: 5.7 - 6.4         Diabetes: >6.4         Glycemic control for adults with diabetes: <7.0     CBG: Recent Labs  Lab 11/27/19 2249  GLUCAP 131*      This patient is critically ill with multiple organ system failure which requires frequent high complexity decision making, assessment, support, evaluation, and titration of therapies. This was completed through the application of advanced monitoring technologies and extensive interpretation of multiple databases. During this encounter critical care time was devoted to patient care services described in this note for 35 minutes.  01/27/20, DO 11/29/19 2:57 PM [facility]

## 2019-11-30 ENCOUNTER — Inpatient Hospital Stay (HOSPITAL_COMMUNITY): Payer: Medicare PPO

## 2019-11-30 DIAGNOSIS — Z7901 Long term (current) use of anticoagulants: Secondary | ICD-10-CM

## 2019-11-30 DIAGNOSIS — I5043 Acute on chronic combined systolic (congestive) and diastolic (congestive) heart failure: Secondary | ICD-10-CM

## 2019-11-30 DIAGNOSIS — I4892 Unspecified atrial flutter: Secondary | ICD-10-CM

## 2019-11-30 DIAGNOSIS — Z66 Do not resuscitate: Secondary | ICD-10-CM

## 2019-11-30 DIAGNOSIS — I5021 Acute systolic (congestive) heart failure: Secondary | ICD-10-CM

## 2019-11-30 DIAGNOSIS — I10 Essential (primary) hypertension: Secondary | ICD-10-CM

## 2019-11-30 LAB — BASIC METABOLIC PANEL
Anion gap: 9 (ref 5–15)
BUN: 27 mg/dL — ABNORMAL HIGH (ref 8–23)
CO2: 32 mmol/L (ref 22–32)
Calcium: 8.3 mg/dL — ABNORMAL LOW (ref 8.9–10.3)
Chloride: 97 mmol/L — ABNORMAL LOW (ref 98–111)
Creatinine, Ser: 1.6 mg/dL — ABNORMAL HIGH (ref 0.44–1.00)
GFR calc Af Amer: 32 mL/min — ABNORMAL LOW (ref >60)
GFR calc non Af Amer: 27 mL/min — ABNORMAL LOW (ref >60)
Glucose, Bld: 113 mg/dL — ABNORMAL HIGH (ref 70–99)
Potassium: 3.8 mmol/L (ref 3.5–5.1)
Sodium: 138 mmol/L (ref 135–145)

## 2019-11-30 LAB — CBC
HCT: 32.9 % — ABNORMAL LOW (ref 36.0–46.0)
Hemoglobin: 10.7 g/dL — ABNORMAL LOW (ref 12.0–15.0)
MCH: 30.9 pg (ref 26.0–34.0)
MCHC: 32.5 g/dL (ref 30.0–36.0)
MCV: 95.1 fL (ref 80.0–100.0)
Platelets: 212 10*3/uL (ref 150–400)
RBC: 3.46 MIL/uL — ABNORMAL LOW (ref 3.87–5.11)
RDW: 15.8 % — ABNORMAL HIGH (ref 11.5–15.5)
WBC: 12.3 10*3/uL — ABNORMAL HIGH (ref 4.0–10.5)
nRBC: 0 % (ref 0.0–0.2)

## 2019-11-30 LAB — PROCALCITONIN: Procalcitonin: 0.23 ng/mL

## 2019-11-30 MED ORDER — SODIUM CHLORIDE 0.9 % IV SOLN
1.0000 g | INTRAVENOUS | Status: DC
  Administered 2019-11-30 – 2019-12-03 (×4): 1 g via INTRAVENOUS
  Filled 2019-11-30 (×2): qty 10
  Filled 2019-11-30 (×2): qty 1
  Filled 2019-11-30: qty 10

## 2019-11-30 MED ORDER — LORAZEPAM 0.5 MG PO TABS
0.5000 mg | ORAL_TABLET | Freq: Every day | ORAL | Status: DC
  Administered 2019-11-30 – 2019-12-02 (×3): 0.5 mg via ORAL
  Filled 2019-11-30 (×3): qty 1

## 2019-11-30 MED ORDER — AMLODIPINE BESYLATE 2.5 MG PO TABS
2.5000 mg | ORAL_TABLET | Freq: Every day | ORAL | Status: DC
  Administered 2019-11-30 – 2019-12-03 (×4): 2.5 mg via ORAL
  Filled 2019-11-30 (×4): qty 1

## 2019-11-30 NOTE — Plan of Care (Signed)
Daughter updated at bedside. She is requesting a right femur xray that the patient was supposed to have before Friday's ortho appointment-- ordered. She indicated that her mother is supposed to be taking ativan QHS, which may explain why her tremors are worse currently. Corroborated from SNF [facility]- reordered as requested.  Steffanie Dunn, DO 11/30/19 2:24 PM [facility]

## 2019-11-30 NOTE — Progress Notes (Signed)
Progress Note  Patient Name: Enrika Aguado Hetz Date of Encounter: 11/30/2019  [facility] Cardiologist: Chilton Si, MD   Subjective   No complaints overnight. Breathing better. HR stable - sinus. LVEF in 08/2019 was 45-50%. BP elevated today, but was low normal yesterday. On low dose amlodipine and hydralazine added. Creatinine improving with diuresis. Holding BB and amiodarone.  Inpatient Medications    Scheduled Meds: . amLODipine  2.5 mg Oral Daily  . apixaban  2.5 mg Oral BID  . Chlorhexidine Gluconate Cloth  6 each Topical Q0600  . citalopram  20 mg Oral Daily  . fentaNYL (SUBLIMAZE) injection  25 mcg Intravenous Once  . furosemide  40 mg Intravenous Q12H  . hydrALAZINE  25 mg Oral Q8H  . mouth rinse  15 mL Mouth Rinse BID  . psyllium  1 packet Oral Daily  . ziprasidone  20 mg Oral Q1400   Continuous Infusions: . cefTRIAXone (ROCEPHIN)  IV     PRN Meds: hydrALAZINE, ipratropium-albuterol, polyvinyl alcohol   Vital Signs    Vitals:   11/30/19 0611 11/30/19 0704 11/30/19 0800 11/30/19 0810  BP: (!) 168/69 (!) 163/73 (!) 157/58 (!) 157/58  Pulse:  76 72   Resp:  (!) 26 (!) 22   Temp:      TempSrc:      SpO2:  96% 97%   Weight:      Height:        Intake/Output Summary (Last 24 hours) at 11/30/2019 0839 Last data filed at 11/30/2019 0800 Gross per 24 hour  Intake 451.13 ml  Output 1551 ml  Net -1099.87 ml   Last 3 Weights 11/30/2019 11/29/2019 11/27/2019  Weight (lbs) 105 lb 9.6 oz 111 lb 12.4 oz 111 lb 1.8 oz  Weight (kg) 47.9 kg 50.7 kg 50.4 kg      Telemetry    NSR with artifact- Personally Reviewed  Physical Exam   GEN: No acute distress. Frail   Neck: supple, JVP 3 cm Cardiac: RRR Respiratory: Diminished BS bases GI: Soft, nontender, non-distended  MS: No edema; ecchymoses noted Neuro:  Nonfocal  Psych: Normal affect   Labs    High Sensitivity Troponin:   Recent Labs  Lab 11/27/19 1341 11/27/19 1544  TROPONINIHS 17 17       Chemistry Recent Labs  Lab 11/28/19 0113 11/29/19 0554 11/30/19 0111  NA 138 137 138  K 3.4* 3.5 3.8  CL 102 100 97*  CO2 24 24 32  GLUCOSE 136* 139* 113*  BUN 40* 33* 27*  CREATININE 3.73* 2.24* 1.60*  CALCIUM 8.5* 8.3* 8.3*  PROT 6.3*  --   --   ALBUMIN 3.2*  --   --   AST 46*  --   --   ALT 33  --   --   ALKPHOS 64  --   --   BILITOT 0.6  --   --   GFRNONAA 10* 18* 27*  GFRAA 11* 21* 32*  ANIONGAP 12 13 9      Hematology Recent Labs  Lab 11/28/19 0113 11/29/19 0554 11/30/19 0111  WBC 9.3 15.7* 12.3*  RBC 3.36* 3.30* 3.46*  HGB 10.4* 10.3* 10.7*  HCT 32.7* 31.5* 32.9*  MCV 97.3 95.5 95.1  MCH 31.0 31.2 30.9  MCHC 31.8 32.7 32.5  RDW 16.0* 15.8* 15.8*  PLT 206 207 212    Radiology    DG CHEST PORT 1 VIEW  Result Date: 11/28/2019 CLINICAL DATA:  Hypoxemia EXAM: PORTABLE CHEST 1 VIEW COMPARISON:  11/27/2019 FINDINGS: There is new diffuse interstitial pulmonary opacity and layering pleural effusions with associated atelectasis or consolidation. Cardiomegaly. IMPRESSION: New diffuse interstitial pulmonary opacity and layering pleural effusions with associated atelectasis or consolidation, consistent with edema in the setting of cardiomegaly. Electronically Signed   By: Lauralyn Primes M.D.   On: 11/28/2019 18:52     Patient Profile     86 year old female with past medical history of paroxysmal atrial fibrillation, prior CVA, hypertension, mild dementia admitted with bradycardia.  Most recent echocardiogram July 2021 showed ejection fraction 45 to 50%, mild left ventricular hypertrophy, moderate left atrial enlargement, mild right atrial enlargement, mild mitral regurgitation, moderate tricuspid regurgitation.  Assessment & Plan    1 acute combined systolic/diastolic congestive heart failure-chest x-ray shows bilateral pleural effusions.  Saturations have decreased. I/O+[id number].  We will give Lasix 40 mg IV twice daily.  Follow renal function closely. Good diuresis  overnight - continue today, may transition to oral tomorrow.  2 bradycardia-resolved after holding amiodarone and metoprolol.  Heart rate now in the 60s and 70s.  We will continue to hold today.  Would consider resuming BB without amiodarone at this point.  3 paroxysmal atrial fibrillation- appears to currently be in sinus.  Continue apixaban. Would be very hesistant to resume amiodarone at this point due to recent complete heart block, especially given the long T1/2 of amiodarone.  4 acute kidney disease-creatinine improving on lasix (3.73, 2.24, 1.6) - net negative 1.2L yesterday. Monitor.  5 hypertension-BP elevated this morning, hydralazine 25 mg TID just added-  can wean NTG to imdur as BP allows.  6 NCB  7 Fever-Antibiotics initiated per CCM; FU cultures.  For questions or updates, please contact [facility] Please consult [email or web address] for contact info under   [name], MD, [name]  [place]  [facility]  Medical Director of the [facility]id [facility] &  Card[facility]ovascular [facility] Diplomate of the American Board of Clinical Lipidology Attending Cardiologist  Direct Dial: [phone]  Fax: [phone]  Website:  [email or web address]  [name], MD  11/30/2019, 8:39 AM

## 2019-11-30 NOTE — Telephone Encounter (Signed)
Patient currently admitted

## 2019-11-30 NOTE — Progress Notes (Signed)
NAME:  Yolanda Baker, MRN:  [id number], DOB:  05/02/1923, LOS: 2 ADMISSION DATE:  11/27/2019, CONSULTATION DATE: 11/27/19 REFERRING MD: Dr Allena Katz, CHIEF COMPLAINT:  bradycardia  Brief History   86 y/o female with symptomatic bradycardia   History of present illness   This is a 86y/o female that presented to the ER today for bradycardia.  Pt was seen by PT today and heart rate was low so was sent to ER.  On arrival the pt was found to be bradycardic but with a normal bp.  She was admitted to PCU with  percutaneous pacing set at 50bpm.  CCM was called to bedside on arrival to the PCU.  Pt is awake and alert.  She denies discomfort despite being paced.  Underlying rhythm is complete heart block at 20bpm.  She denies chest pain/chest pressure/ SOB/palpitations once the pacer was decreased.  Past Medical History  CHF - chronic diastolic Afib COPD HTN  Significant Hospital Events     Consults:  Cardiology  Procedures:  none  Significant Diagnostic Tests:  UA (straight cath) <5 WBCs  Micro Data:  Blood cx 10/2> Urine cx 10/2>  Antimicrobials:  vanc > 10/2 Cefepime >10/2  Interval history:  Off all drips, denies complaints this morning.  Objective   Blood pressure (!) 163/73, pulse 76, temperature 99 F (37.2 C), temperature source Oral, resp. rate (!) 26, height 5\' 2"  (1.575 m), weight 47.9 kg, SpO2 96 %.        Intake/Output Summary (Last 24 hours) at 11/30/2019 0753 Last data filed at 11/30/2019 01/30/2020 Gross per 24 hour  Intake 462.63 ml  Output 1751 ml  Net -1288.37 ml   Filed Weights   11/27/19 2306 11/29/19 0115 11/30/19 0600  Weight: 50.4 kg 50.7 kg 47.9 kg    Examination: General: Frail appearing elderly woman sitting in bed in no acute distress HENT: [place]/AT, eyes anicteric Lungs: Breathing comfortably on 2 L nasal cannula.  Reduced bibasilar breath sounds, less rales today.  No respiratory distress or conversational dyspnea. Cardiovascular: Regular rate,  irregular rhythm Abdomen: Soft, nontender, nondistended Extremities: No peripheral edema, no clubbing or cyanosis Neuro: Less tremulous today.  Awake and alert, normal speech.  At her recent baseline. Derm: No rashes, thin skin with bruising  Assessment & Plan:  Symptomatic bradycardia 2/2 amiodarone and lopressor use as an outpatient.  Unsure if her AKI contributed to medication toxicity or if AKI is due to hypoperfusion. Chronic HFpEF  Chronic atrial fibrillation; previously had converted to complete heart block -Continue monitoring in telemetry.  Stable to stepdown from the ICU to cardiac telemetry floor. -Appreciate cardiology's assistance.  Will restart amiodarone at this point in their recommendation. -Continue Eliquis -Continued diuresis  Hypertension -Continue hydralazine, adding PTA low-dose amlodipine  AKI-unclear chronicity, possibly related to hypoperfusion from low output with bradycardia.  Improving. -Strict I/O -Renally dose meds and avoid nephrotoxic meds  COPD--unsure where this history comes in; she is a never smoker -No intervention  Fever--concern for infection of unknown etiology -De-escalate to ceftriaxone.  Plan for 7 days total of beta-lactam with unknown source.   Patient signed out to Dr. 01/30/20 from [facility], who will assume care tomorrow.   Best practice:  Diet: soft  Pain/Anxiety/Delirium protocol (if indicated):  VAP protocol (if indicated): n/a DVT prophylaxis: heparin sq GI prophylaxis:  Glucose control:  Mobility: bedrest Code Status:DNR Family Communication:  Disposition: ICU  Labs   CBC: Recent Labs  Lab 11/27/19 1341 11/28/19 0113 11/29/19 0554 11/30/19  0111  WBC 9.1 9.3 15.7* 12.3*  NEUTROABS 6.0  --   --   --   HGB 10.8* 10.4* 10.3* 10.7*  HCT 33.3* 32.7* 31.5* 32.9*  MCV 97.7 97.3 95.5 95.1  PLT 232 206 207 212    Basic Metabolic Panel: Recent Labs  Lab 11/27/19 1341 11/28/19 0113 11/29/19 0554 11/30/19 0111  NA 138  138 137 138  K 3.6 3.4* 3.5 3.8  CL 101 102 100 97*  CO2 26 24 24  32  GLUCOSE 103* 136* 139* 113*  BUN 31* 40* 33* 27*  CREATININE 3.02* 3.73* 2.24* 1.60*  CALCIUM 8.3* 8.5* 8.3* 8.3*  MG 1.9  --   --   --    GFR: Estimated Creatinine Clearance: 16.3 mL/min (A) (by C-G formula based on SCr of 1.6 mg/dL (H)). Recent Labs  Lab 11/27/19 1341 11/28/19 0113 11/29/19 0554 11/30/19 0111  PROCALCITON  --   --  0.16 0.23  WBC 9.1 9.3 15.7* 12.3*    Liver Function Tests: Recent Labs  Lab 11/28/19 0113  AST 46*  ALT 33  ALKPHOS 64  BILITOT 0.6  PROT 6.3*  ALBUMIN 3.2*   No results for input(s): LIPASE, AMYLASE in the last 168 hours. No results for input(s): AMMONIA in the last 168 hours.  ABG    Component Value Date/Time   PHART 7.520 (H) 10/16/2019 1600   PCO2ART 34.1 10/16/2019 1600   PO2ART 90 10/16/2019 1600   HCO3 27.8 10/16/2019 1600   TCO2 29 10/16/2019 1600   ACIDBASEDEF 1.0 09/08/2006 1630   O2SAT 98.0 10/16/2019 1600     Coagulation Profile: No results for input(s): INR, PROTIME in the last 168 hours.  Cardiac Enzymes: Recent Labs  Lab 11/28/19 0113  CKTOTAL 54    HbA1C: Hgb A1c MFr Bld  Date/Time Value Ref Range Status  06/30/2014 02:46 AM 5.9 (H) 4.8 - 5.6 % Final    Comment:    (NOTE)         Pre-diabetes: 5.7 - 6.4         Diabetes: >6.4         Glycemic control for adults with diabetes: <7.0     CBG: Recent Labs  Lab 11/27/19 2249  GLUCAP 131*       01/27/20, DO 11/30/19 12:47 PM [facility]

## 2019-12-01 DIAGNOSIS — I483 Typical atrial flutter: Secondary | ICD-10-CM

## 2019-12-01 LAB — CBC
HCT: 32 % — ABNORMAL LOW (ref 36.0–46.0)
Hemoglobin: 10.2 g/dL — ABNORMAL LOW (ref 12.0–15.0)
MCH: 30.7 pg (ref 26.0–34.0)
MCHC: 31.9 g/dL (ref 30.0–36.0)
MCV: 96.4 fL (ref 80.0–100.0)
Platelets: 212 10*3/uL (ref 150–400)
RBC: 3.32 MIL/uL — ABNORMAL LOW (ref 3.87–5.11)
RDW: 15.9 % — ABNORMAL HIGH (ref 11.5–15.5)
WBC: 9.5 10*3/uL (ref 4.0–10.5)
nRBC: 0 % (ref 0.0–0.2)

## 2019-12-01 MED ORDER — FUROSEMIDE 40 MG PO TABS
40.0000 mg | ORAL_TABLET | Freq: Every day | ORAL | Status: DC
  Administered 2019-12-02 – 2019-12-03 (×2): 40 mg via ORAL
  Filled 2019-12-01 (×2): qty 1

## 2019-12-01 NOTE — Progress Notes (Signed)
Progress Note  Patient Name: Yolanda Baker Date of Encounter: 12/01/2019  [facility] Cardiologist: Chilton Si, MD   Subjective   No issues overnight. Femur x-ray ordered by PCCM -demonstrates continued healing. Ativan restarted. Net positive recorded overnight. Creatinine has further improved to 1.6 today.  Inpatient Medications    Scheduled Meds: . amLODipine  2.5 mg Oral Daily  . apixaban  2.5 mg Oral BID  . Chlorhexidine Gluconate Cloth  6 each Topical Q0600  . citalopram  20 mg Oral Daily  . fentaNYL (SUBLIMAZE) injection  25 mcg Intravenous Once  . furosemide  40 mg Intravenous Q12H  . hydrALAZINE  25 mg Oral Q8H  . LORazepam  0.5 mg Oral QHS  . mouth rinse  15 mL Mouth Rinse BID  . psyllium  1 packet Oral Daily  . ziprasidone  20 mg Oral Q1400   Continuous Infusions: . cefTRIAXone (ROCEPHIN)  IV 200 mL/hr at 12/01/19 0900   PRN Meds: hydrALAZINE, ipratropium-albuterol, polyvinyl alcohol   Vital Signs    Vitals:   12/01/19 0700 12/01/19 0800 12/01/19 0819 12/01/19 0834  BP: (!) 124/103 (!) 154/46  (!) 154/46  Pulse: (!) 57 68    Resp: 20 20    Temp:   98.1 F (36.7 C)   TempSrc:   Oral   SpO2: 95% 98%    Weight:      Height:        Intake/Output Summary (Last 24 hours) at 12/01/2019 1025 Last data filed at 12/01/2019 0900 Gross per 24 hour  Intake 1208.1 ml  Output 350 ml  Net 858.1 ml   Last 3 Weights 11/30/2019 11/29/2019 11/27/2019  Weight (lbs) 105 lb 9.6 oz 111 lb 12.4 oz 111 lb 1.8 oz  Weight (kg) 47.9 kg 50.7 kg 50.4 kg      Telemetry    Sinus rhythm or sinus bradycardia-  Personally Reviewed  Physical Exam   GEN: No acute distress. Frail   Neck: supple, JVP 1cm Cardiac: RRR Respiratory: Diminished BS bases GI: Soft, nontender, non-distended  MS: No edema; ecchymoses noted Neuro:  Nonfocal  Psych: Normal affect   Labs    High Sensitivity Troponin:   Recent Labs  Lab 11/27/19 1341 11/27/19 1544  TROPONINIHS 17 17        Chemistry Recent Labs  Lab 11/28/19 0113 11/29/19 0554 11/30/19 0111  NA 138 137 138  K 3.4* 3.5 3.8  CL 102 100 97*  CO2 24 24 32  GLUCOSE 136* 139* 113*  BUN 40* 33* 27*  CREATININE 3.73* 2.24* 1.60*  CALCIUM 8.5* 8.3* 8.3*  PROT 6.3*  --   --   ALBUMIN 3.2*  --   --   AST 46*  --   --   ALT 33  --   --   ALKPHOS 64  --   --   BILITOT 0.6  --   --   GFRNONAA 10* 18* 27*  GFRAA 11* 21* 32*  ANIONGAP 12 13 9      Hematology Recent Labs  Lab 11/29/19 0554 11/30/19 0111 12/01/19 0123  WBC 15.7* 12.3* 9.5  RBC 3.30* 3.46* 3.32*  HGB 10.3* 10.7* 10.2*  HCT 31.5* 32.9* 32.0*  MCV 95.5 95.1 96.4  MCH 31.2 30.9 30.7  MCHC 32.7 32.5 31.9  RDW 15.8* 15.8* 15.9*  PLT 207 212 212    Radiology    DG FEMUR PORT, MIN 2 VIEWS RIGHT  Result Date: 11/30/2019 CLINICAL DATA:  History of prior  femoral fracture EXAM: RIGHT FEMUR PORTABLE 2 VIEW COMPARISON:  09/10/2019 FINDINGS: Right hip prosthesis is again seen. Healing proximal femoral fracture is noted with callus formation slightly increased when compared with prior exam. Acute fracture is seen. No soft tissue abnormality is noted. Diffuse vascular calcifications are seen. IMPRESSION: Continued healing in proximal right femoral fracture. No new focal abnormality is seen. Electronically Signed   By: Alcide Clever M.D.   On: 11/30/2019 15:29     Patient Profile     86 year old female with past medical history of paroxysmal atrial fibrillation, prior CVA, hypertension, mild dementia admitted with bradycardia.  Most recent echocardiogram July 2021 showed ejection fraction 45 to 50%, mild left ventricular hypertrophy, moderate left atrial enlargement, mild right atrial enlargement, mild mitral regurgitation, moderate tricuspid regurgitation.  Assessment & Plan    1 acute combined systolic/diastolic congestive heart failure-chest x-ray shows bilateral pleural effusions.  Saturations have decreased. I/O+[id number].  We will give Lasix 40  mg IV twice daily.  Follow renal function closely. Good diuresis overnight - transition to oral lasix today.  2 bradycardia-resolved after holding amiodarone and metoprolol.  Heart rate now in the 60s and 70s.  We will continue to hold today.  Would consider resuming BB without amiodarone at this point if HR allows -currently in the 50's.  3 paroxysmal atrial fibrillation- appears to currently be in sinus.  Continue apixaban. Would be very hesistant to resume amiodarone at this point due to recent complete heart block, especially given the long T1/2 of amiodarone.  4 acute kidney disease-creatinine improving on lasix (3.73, 2.24, 1.6) - net negative 1.2L yesterday. Monitor.  5 hypertension-BP elevated this morning, hydralazine 25 mg TID just added-  can wean NTG to imdur as BP allows.  6 NCB  7 Fever-Antibiotics initiated per CCM; FU cultures.  I think she could be transferred out of ICU today - stable from a cardiac standpoint.  For questions or updates, please contact [facility] Please consult [email or web address] for contact info under   [name], MD, [name]  [place]  [facility]  Medical Director of the Advanced Lipid Disorders &  Cardiovascular Risk Reduction Clinic Diplomate of the American Board of Clinical Lipidology Attending Cardiologist  Direct Dial: [phone]  Fax: [phone]  Website:  [email or web address]  [name], MD  12/01/2019, 10:25 AM

## 2019-12-02 DIAGNOSIS — I483 Typical atrial flutter: Secondary | ICD-10-CM | POA: Diagnosis not present

## 2019-12-02 DIAGNOSIS — I10 Essential (primary) hypertension: Secondary | ICD-10-CM | POA: Diagnosis not present

## 2019-12-02 DIAGNOSIS — I4892 Unspecified atrial flutter: Secondary | ICD-10-CM | POA: Diagnosis not present

## 2019-12-02 DIAGNOSIS — I5033 Acute on chronic diastolic (congestive) heart failure: Secondary | ICD-10-CM

## 2019-12-02 DIAGNOSIS — R001 Bradycardia, unspecified: Secondary | ICD-10-CM | POA: Diagnosis not present

## 2019-12-02 DIAGNOSIS — S728X1D Other fracture of right femur, subsequent encounter for closed fracture with routine healing: Secondary | ICD-10-CM | POA: Diagnosis not present

## 2019-12-02 LAB — BASIC METABOLIC PANEL
Anion gap: 13 (ref 5–15)
BUN: 25 mg/dL — ABNORMAL HIGH (ref 8–23)
CO2: 27 mmol/L (ref 22–32)
Calcium: 8.8 mg/dL — ABNORMAL LOW (ref 8.9–10.3)
Chloride: 99 mmol/L (ref 98–111)
Creatinine, Ser: 1.33 mg/dL — ABNORMAL HIGH (ref 0.44–1.00)
GFR calc non Af Amer: 34 mL/min — ABNORMAL LOW (ref >60)
Glucose, Bld: 111 mg/dL — ABNORMAL HIGH (ref 70–99)
Potassium: 3.1 mmol/L — ABNORMAL LOW (ref 3.5–5.1)
Sodium: 139 mmol/L (ref 135–145)

## 2019-12-02 LAB — CBC
HCT: 32.1 % — ABNORMAL LOW (ref 36.0–46.0)
Hemoglobin: 10.3 g/dL — ABNORMAL LOW (ref 12.0–15.0)
MCH: 30.7 pg (ref 26.0–34.0)
MCHC: 32.1 g/dL (ref 30.0–36.0)
MCV: 95.8 fL (ref 80.0–100.0)
Platelets: 219 10*3/uL (ref 150–400)
RBC: 3.35 MIL/uL — ABNORMAL LOW (ref 3.87–5.11)
RDW: 15.9 % — ABNORMAL HIGH (ref 11.5–15.5)
WBC: 8.7 10*3/uL (ref 4.0–10.5)
nRBC: 0 % (ref 0.0–0.2)

## 2019-12-02 MED ORDER — METOPROLOL TARTRATE 12.5 MG HALF TABLET
12.5000 mg | ORAL_TABLET | Freq: Two times a day (BID) | ORAL | Status: DC
  Administered 2019-12-02 – 2019-12-03 (×3): 12.5 mg via ORAL
  Filled 2019-12-02 (×3): qty 1

## 2019-12-02 MED ORDER — POTASSIUM CHLORIDE CRYS ER 20 MEQ PO TBCR
40.0000 meq | EXTENDED_RELEASE_TABLET | Freq: Two times a day (BID) | ORAL | Status: AC
  Administered 2019-12-02 (×2): 40 meq via ORAL
  Filled 2019-12-02 (×2): qty 2

## 2019-12-02 NOTE — Plan of Care (Signed)

## 2019-12-02 NOTE — TOC Initial Note (Signed)
Transition of Care [facility]) - Initial/Assessment Note    Patient Details  Name: Yolanda Baker MRN: [id number] Date of Birth: 07/03/1923  Transition of Care [facility]) CM/SW Contact:    Carmina Miller, LCSWA Phone Number: 12/02/2019, 4:52 PM  Clinical Narrative:                 CSW spoke with patient and her son at bedside. CSW spoke with both about discharge plans and confirmed patient would like to return to [facility] when medically stable for discharge. CSW waiting to hear back from [facility] and will continue to follow.   Expected Discharge Plan: Assisted Living Barriers to Discharge: Continued Medical Work up, English as a second language teacher   Patient Goals and CMS Choice Patient states their goals for this hospitalization and ongoing recovery are:: Get better soon CMS Medicare.gov Compare Post Acute Care list provided to:: Patient Represenative (must comment) Brett Canales, son) Choice offered to / list presented to : Adult Children, Patient  Expected Discharge Plan and Services Expected Discharge Plan: Assisted Living In-house Referral: Clinical Social Work     Living arrangements for the past 2 months: Assisted Living Facility                                      Prior Living Arrangements/Services Living arrangements for the past 2 months: Assisted Living Facility Lives with:: Facility Resident Patient language and need for interpreter reviewed:: Yes Do you feel safe going back to the place where you live?: Yes      Need for Family Participation in Patient Care: Yes (Comment) Care giver support system in place?: Yes (comment)   Criminal Activity/Legal Involvement Pertinent to Current Situation/Hospitalization: No - Comment as needed  Activities of Daily Living   ADL Screening (condition at time of admission) Patient's cognitive ability adequate to safely complete daily activities?: Yes Is the patient deaf or have difficulty hearing?: No Does the patient have  difficulty seeing, even when wearing glasses/contacts?: No Does the patient have difficulty concentrating, remembering, or making decisions?: Yes Patient able to express need for assistance with ADLs?: Yes Does the patient have difficulty dressing or bathing?: Yes Independently performs ADLs?: No Does the patient have difficulty walking or climbing stairs?: Yes  Permission Sought/Granted Permission sought to share information with : Case Manager, Magazine features editor Permission granted to share information with : Yes, Verbal Permission Granted  Share Information with NAME: Brett Canales  Permission granted to share info w AGENCY: [facility]  Permission granted to share info w Relationship: Son  Permission granted to share info w Contact Information: [phone]  Emotional Assessment Appearance:: Appears stated age Attitude/Demeanor/Rapport: Gracious Affect (typically observed): Calm Orientation: : Oriented to Self, Oriented to Place, Oriented to  Time, Oriented to Situation Alcohol / Substance Use: Not Applicable Psych Involvement: No (comment)  Admission diagnosis:  Bradycardia [R00.1] Symptomatic bradycardia [R00.1] Atrial fibrillation, unspecified type [facility]) [I48.91] Patient Active Problem List   Diagnosis Date Noted  . Bradycardia 11/27/2019  . Hypoxia   . Acute respiratory failure with hypoxia (HCC) 10/16/2019  . Persistent atrial fibrillation (HCC) 09/25/2019  . Secondary hypercoagulable state (HCC) 09/25/2019  . Atrial fibrillation with RVR (HCC) 09/11/2019  . PAF (paroxysmal atrial fibrillation) (HCC) 08/04/2019  . Goals of care, counseling/discussion   . Palliative care by specialist   . Pressure injury of skin 05/07/2019  . Periprosthetic fracture around internal prosthetic hip joint 05/06/2019  .  DNR (do not resuscitate) present on admission 02/04/2019  . Acute on chronic diastolic heart failure (HCC)   . GAD (generalized anxiety disorder) 01/15/2018   . Schizoaffective disorder (HCC) 01/15/2018  . Hyperlipidemia LDL goal <70 07/01/2014  . Constipation chronic 07/01/2014  . Acute CVA (cerebrovascular accident) (HCC)   . Acute blood loss anemia 10/12/2013  . Postoperative anemia due to acute blood loss 10/04/2013  . Postop Transfusion 10/04/2013  . Closed fracture of femur with nonunion 10/03/2013  . Femur fracture, right (HCC) 10/03/2013  . Closed femur fracture (HCC) 10/03/2013  . UTI (urinary tract infection) 06/23/2013  . Dementia (HCC) 06/09/2013  . Anxiety 06/09/2013  . Atrial flutter (HCC) 06/07/2013  . Hip fracture (HCC) 06/04/2013  . Depression 06/04/2013  . Anxiety state 06/04/2013  . Essential hypertension 06/04/2013  . Closed right hip fracture (HCC) 06/04/2013   PCP:  Rodrigo Ran, MD Pharmacy:  No Pharmacies Listed    Social Determinants of Health (SDOH) Interventions    Readmission Risk Interventions Readmission Risk Prevention Plan 05/07/2019 02/04/2019  Transportation Screening Complete Complete  PCP or Specialist Appt within 5-7 Days - Complete  PCP or Specialist Appt within 3-5 Days Complete -  Home Care Screening - Complete  Medication Review (RN CM) - Complete  HRI or Home Care Consult Complete -  Social Work Consult for Recovery Care Planning/Counseling Complete -  Palliative Care Screening Complete -  Medication Review Oceanographer) Complete -  Some recent data might be hidden

## 2019-12-02 NOTE — Progress Notes (Signed)
Progress Note  Patient Name: Yolanda Baker Date of Encounter: 12/02/2019  [facility] Cardiologist: Chilton Si, MD   Subjective   No complaints today.  Denies chest pain or shortness of breath.  Heart rate is increased to the low 100 range.  Currently in A. fib.  Inpatient Medications    Scheduled Meds: . amLODipine  2.5 mg Oral Daily  . apixaban  2.5 mg Oral BID  . Chlorhexidine Gluconate Cloth  6 each Topical Q0600  . citalopram  20 mg Oral Daily  . fentaNYL (SUBLIMAZE) injection  25 mcg Intravenous Once  . furosemide  40 mg Oral Daily  . hydrALAZINE  25 mg Oral Q8H  . LORazepam  0.5 mg Oral QHS  . mouth rinse  15 mL Mouth Rinse BID  . psyllium  1 packet Oral Daily  . ziprasidone  20 mg Oral Q1400   Continuous Infusions: . cefTRIAXone (ROCEPHIN)  IV 200 mL/hr at 12/01/19 0900   PRN Meds: hydrALAZINE, ipratropium-albuterol, polyvinyl alcohol   Vital Signs    Vitals:   12/01/19 2135 12/01/19 2300 12/02/19 0646 12/02/19 0754  BP: 108/89  (!) 148/77 (!) 142/65  Pulse:  72    Resp:  20    Temp:  98.5 F (36.9 C)  98.5 F (36.9 C)  TempSrc:  Oral  Oral  SpO2:  96%  96%  Weight:      Height:        Intake/Output Summary (Last 24 hours) at 12/02/2019 0949 Last data filed at 12/01/2019 1800 Gross per 24 hour  Intake 720 ml  Output 400 ml  Net 320 ml   Last 3 Weights 11/30/2019 11/29/2019 11/27/2019  Weight (lbs) 105 lb 9.6 oz 111 lb 12.4 oz 111 lb 1.8 oz  Weight (kg) 47.9 kg 50.7 kg 50.4 kg      Telemetry    Atrial fibrillation with a ventricular sponsor of 90-1 20- Personally Reviewed  ECG    Not performed today- Personally Reviewed  Physical Exam   GEN: No acute distress.   Neck: No JVD Cardiac: RRR, no murmurs, rubs, or gallops.  Respiratory: Clear to auscultation bilaterally. GI: Soft, nontender, non-distended  MS: No edema; No deformity. Neuro:  Nonfocal  Psych: Normal affect   Labs    High Sensitivity Troponin:   Recent Labs    Lab 11/27/19 1341 11/27/19 1544  TROPONINIHS 17 17      Chemistry Recent Labs  Lab 11/28/19 0113 11/29/19 0554 11/30/19 0111  NA 138 137 138  K 3.4* 3.5 3.8  CL 102 100 97*  CO2 24 24 32  GLUCOSE 136* 139* 113*  BUN 40* 33* 27*  CREATININE 3.73* 2.24* 1.60*  CALCIUM 8.5* 8.3* 8.3*  PROT 6.3*  --   --   ALBUMIN 3.2*  --   --   AST 46*  --   --   ALT 33  --   --   ALKPHOS 64  --   --   BILITOT 0.6  --   --   GFRNONAA 10* 18* 27*  GFRAA 11* 21* 32*  ANIONGAP 12 13 9      Hematology Recent Labs  Lab 11/30/19 0111 12/01/19 0123 12/02/19 0102  WBC 12.3* 9.5 8.7  RBC 3.46* 3.32* 3.35*  HGB 10.7* 10.2* 10.3*  HCT 32.9* 32.0* 32.1*  MCV 95.1 96.4 95.8  MCH 30.9 30.7 30.7  MCHC 32.5 31.9 32.1  RDW 15.8* 15.9* 15.9*  PLT 212 212 219  BNPNo results for input(s): BNP, PROBNP in the last 168 hours.   DDimer No results for input(s): DDIMER in the last 168 hours.   Radiology    DG FEMUR PORT, MIN 2 VIEWS RIGHT  Result Date: 11/30/2019 CLINICAL DATA:  History of prior femoral fracture EXAM: RIGHT FEMUR PORTABLE 2 VIEW COMPARISON:  09/10/2019 FINDINGS: Right hip prosthesis is again seen. Healing proximal femoral fracture is noted with callus formation slightly increased when compared with prior exam. Acute fracture is seen. No soft tissue abnormality is noted. Diffuse vascular calcifications are seen. IMPRESSION: Continued healing in proximal right femoral fracture. No new focal abnormality is seen. Electronically Signed   By: Alcide Clever M.D.   On: 11/30/2019 15:29    Cardiac Studies   None  Patient Profile     86 year old female with past medical history of paroxysmal atrial fibrillation, prior CVA, hypertension, mild dementia admitted with bradycardia.  Most recent echocardiogram July 2021 showed ejection fraction 45 to 50%, mild left ventricular hypertrophy, moderate left atrial enlargement, mild right atrial enlargement, mild mitral regurgitation, moderate  tricuspid regurgitation.  Assessment & Plan    1: Acute on chronic diastolic heart failure-patient was getting IV Lasix twice daily.  She is on 20 mg of Lasix p.o. daily at home.  Her serum creatinine is improving.  Her I's and O's are still +3.5 L.  She does not appear to be wet on exam.  Will transition to furosemide 40 mg p.o. today and closely follow her volume status and blood work as an outpatient.  2: Bradycardia-resolved.  Amiodarone and beta-blocker were put on hold.  She was in sinus rhythm yesterday however today she is in A. fib with RVR.  She is on oral anticoagulation.  We will add back low-dose beta-blocker and hold off on amiodarone given her prior bradycardia.  3: PAF-back in A. fib today.  Continue apixaban.  Restart low-dose beta-blocker for rate control  4: AKI-serum creatinine has declined from 3.73 down to 1.6.  I/O does not appear to have been negative yesterday and still remains +3.6 L.  5: Essential hypertension-blood pressure under good control on current medications  6: NCB  Patient stable from a heart point of view.  We will restart low-dose beta-blocker for rate control.  Appears to be euvolemic.  Probably okay to discharge from a heart point of view with close outpatient follow-up when other medical problems have been sufficiently addressed.     For questions or updates, please contact [facility] Please consult [email or web address] for contact info under        Signed, Nanetta Batty, MD  12/02/2019, 9:49 AM

## 2019-12-02 NOTE — Care Management Important Message (Signed)
Important Message  Patient Details  Name: Yolanda Baker MRN: [id number] Date of Birth: [birth year]   Medicare Important Message Given:  Yes     Yolanda Baker 12/02/2019, 2:12 PM

## 2019-12-02 NOTE — Progress Notes (Signed)
Pt flipped into a. fib around 1922. EKG completed and MD notified. Pt rate controlled with HR in the low 90's. No new orders given. Will continue to monitor.

## 2019-12-02 NOTE — Progress Notes (Signed)
PROGRESS NOTE    Yolanda Baker  AJG:[id number] DOB: 23-Apr-1923 DOA: 11/27/2019 PCP: Rodrigo Ran, MD   Brief Narrative:    Yolanda Baker is a 86 y.o. female with medical history significant of COPD, atrial fibrillation on Eliquis, with a history of stroke, chronic diastolic congestive heart failure. Patient seen today for generalized weakness brought from facility after feeling tired and found to have low heart rate by physical therapist.  Daughter at bedside Ms. Candy she also wants me to order an x-ray of her right hip for follow-up for healing per orthopedic recommendation.  Patient today is alert awake oriented cooperative and facility is essentially bedbound and needs total assist. In ED: Blood pressure (!) 121/53, pulse (!) 143, temperature 98.9 F (37.2 C), temperature source Oral, resp. rate 14, SpO2 97 %. Pt  Given 1/2 ns 500 ml bolus. cardiology consulted by edmd with rec for stopping metoprolol and amiodarone.  Patient evaluated, ICU took patient to their service given ongoing need for isoproterenol, acute hypoxia and worsening volume status concerning for heart failure exacerbation.  Isoproterenol weaned off, heart rate no longer bradycardic, transferred back to hospitalist service.  12/02/2019 patient noted to be back in A. fib with mild RVR now rate 100-110 -resume low-dose metoprolol per cardiology   Assessment & Plan:   Active Problems:   Essential hypertension   Atrial flutter (HCC)   Closed femur fracture (HCC)   DNR (do not resuscitate) present on admission   Bradycardia   Afib with symptomatic bradycardia with slow ventricular response, POA, stable - Previously on transcutaneous pacer, amiodarone and metoprolol were held at admission - Patient converted to sinus rhythm but is now back in A. fib with mild RVR rate around 100-110 but asymptomatic - Cardiology continues to follow, reinitiate metoprolol - Continue eliquis.   Acute on chronic heart failure - EF 45-50%  with global hypokinesis 7/21, acute exacerbation resolving - Lasix 40IV BID per cardiology -transition to p.o. Lasix today - Likely acute volume overload or potentially worsening EF in the setting of above  Acute hypoxic respiratory failure 2/2 above - Currently on room air  Single low-grade fever, rule out infection  - Patient does not meet sepsis criteria - Cefepime transition to ceftriaxone, completed 5 days of antibiotics - Discontinue antibiotics given completion of treatment for presumed CAP/should also cover UTI -neither of which were confirmed  COPD, not in acute exacerbation - Continue home meds  Closed rt femur fracture, POA: - Pt had rt hip fracture in march -stable, patient is essentially nonambulatory after fracture -Repeat imaging here shows healing   DVT prophylaxis:  Pt is on Eliquis.   Code Status:  DNR  Consults called:  Cardiology  PCCM   DVT prophylaxis: Eliquis Code Status: DNR Family Communication: Daughter at bedside  Status is: Inpatient  Dispo: The patient is from: SNF  Anticipated d/c is to: SNF  Anticipated d/c date is: 24 to 48 hours  Patient currently is not medically stable to d/c, given need for recent pacing, further evaluation and medication management with PCCM, cardiology and possible need for procedure if patient's heart rate does not improve adequately  Subjective: No acute issues or events overnight, patient no longer hypoxic, heart rate minimally elevated today but patient remains asymptomatic, she otherwise denies nausea, vomiting, diarrhea, constipation, headache, fevers, chills, chest pain, shortness of breath.  Objective: Vitals:   12/01/19 1900 12/01/19 2135 12/01/19 2300 12/02/19 0646  BP: (!) 143/52 108/89  (!) 148/77  Pulse: 66  72   Resp: 15  20   Temp: 98.9 F (37.2 C)  98.5 F (36.9 C)   TempSrc: Oral  Oral   SpO2: 95%  96%   Weight:      Height:        Intake/Output  Summary (Last 24 hours) at 12/02/2019 0731 Last data filed at 12/01/2019 1800 Gross per 24 hour  Intake 1288.1 ml  Output 400 ml  Net 888.1 ml   Filed Weights   11/27/19 2306 11/29/19 0115 11/30/19 0600  Weight: 50.4 kg 50.7 kg 47.9 kg    Examination:  General:  Pleasantly resting in bed, No acute distress. HEENT:  Normocephalic atraumatic.  Sclerae nonicteric, noninjected.  Extraocular movements intact bilaterally. Neck:  Without mass or deformity.  Trachea is midline. Lungs:  Clear to auscultate bilaterally without rhonchi, wheeze, or rales. Heart: Irregularly irregular, tachycardic at 110.  Without murmurs, rubs, or gallops. Abdomen:  Soft, nontender, nondistended.  Without guarding or rebound. Extremities: Without cyanosis, clubbing, edema, or obvious deformity. Vascular:  Dorsalis pedis and posterior tibial pulses palpable bilaterally. Skin:  Warm and dry, no erythema, no ulcerations.  Data Reviewed: I have personally reviewed following labs and imaging studies  CBC: Recent Labs  Lab 11/27/19 1341 11/27/19 1341 11/28/19 0113 11/29/19 0554 11/30/19 0111 12/01/19 0123 12/02/19 0102  WBC 9.1   < > 9.3 15.7* 12.3* 9.5 8.7  NEUTROABS 6.0  --   --   --   --   --   --   HGB 10.8*   < > 10.4* 10.3* 10.7* 10.2* 10.3*  HCT 33.3*   < > 32.7* 31.5* 32.9* 32.0* 32.1*  MCV 97.7   < > 97.3 95.5 95.1 96.4 95.8  PLT 232   < > 206 207 212 212 219   < > = values in this interval not displayed.   Basic Metabolic Panel: Recent Labs  Lab 11/27/19 1341 11/28/19 0113 11/29/19 0554 11/30/19 0111  NA 138 138 137 138  K 3.6 3.4* 3.5 3.8  CL 101 102 100 97*  CO2 26 24 24  32  GLUCOSE 103* 136* 139* 113*  BUN 31* 40* 33* 27*  CREATININE 3.02* 3.73* 2.24* 1.60*  CALCIUM 8.3* 8.5* 8.3* 8.3*  MG 1.9  --   --   --    GFR: Estimated Creatinine Clearance: 16.3 mL/min (A) (by C-G formula based on SCr of 1.6 mg/dL (H)). Liver Function Tests: Recent Labs  Lab 11/28/19 0113  AST 46*    ALT 33  ALKPHOS 64  BILITOT 0.6  PROT 6.3*  ALBUMIN 3.2*   No results for input(s): LIPASE, AMYLASE in the last 168 hours. No results for input(s): AMMONIA in the last 168 hours. Coagulation Profile: No results for input(s): INR, PROTIME in the last 168 hours. Cardiac Enzymes: Recent Labs  Lab 11/28/19 0113  CKTOTAL 54   BNP (last 3 results) No results for input(s): PROBNP in the last 8760 hours. HbA1C: No results for input(s): HGBA1C in the last 72 hours. CBG: Recent Labs  Lab 11/27/19 2249  GLUCAP 131*   Lipid Profile: No results for input(s): CHOL, HDL, LDLCALC, TRIG, CHOLHDL, LDLDIRECT in the last 72 hours. Thyroid Function Tests: No results for input(s): TSH, T4TOTAL, FREET4, T3FREE, THYROIDAB in the last 72 hours. Anemia Panel: No results for input(s): VITAMINB12, FOLATE, FERRITIN, TIBC, IRON, RETICCTPCT in the last 72 hours. Sepsis Labs: Recent Labs  Lab 11/29/19 0554 11/30/19 0111  PROCALCITON 0.16 0.23    Recent Results (  from the past 240 hour(s))  Respiratory Panel by RT PCR (Flu A&B, Covid) - Nasopharyngeal Swab     Status: None   Collection Time: 11/27/19  2:09 PM   Specimen: Nasopharyngeal Swab  Result Value Ref Range Status   SARS Coronavirus 2 by RT PCR NEGATIVE NEGATIVE Final    Comment: (NOTE) SARS-CoV-2 target nucleic acids are NOT DETECTED.  The SARS-CoV-2 RNA is generally detectable in upper respiratoy specimens during the acute phase of infection. The lowest concentration of SARS-CoV-2 viral copies this assay can detect is 131 copies/mL. A negative result does not preclude SARS-Cov-2 infection and should not be used as the sole basis for treatment or other patient management decisions. A negative result may occur with  improper specimen collection/handling, submission of specimen other than nasopharyngeal swab, presence of viral mutation(s) within the areas targeted by this assay, and inadequate number of viral copies (<131 copies/mL).  A negative result must be combined with clinical observations, patient history, and epidemiological information. The expected result is Negative.  Fact Sheet for Patients:  https://www.moore.com/  Fact Sheet for Healthcare Providers:  https://www.young.biz/  This test is no t yet approved or cleared by the [place] FDA and  has been authorized for detection and/or diagnosis of SARS-CoV-2 by FDA under an Emergency Use Authorization (EUA). This EUA will remain  in effect (meaning this test can be used) for the duration of the COVID-19 declaration under Section 564(b)(1) of the Act, 21 U.S.C. section 360bbb-3(b)(1), unless the authorization is terminated or revoked sooner.     Influenza A by PCR NEGATIVE NEGATIVE Final   Influenza B by PCR NEGATIVE NEGATIVE Final    Comment: (NOTE) The Xpert Xpress SARS-CoV-2/FLU/RSV assay is intended as an aid in  the diagnosis of influenza from Nasopharyngeal swab specimens and  should not be used as a sole basis for treatment. Nasal washings and  aspirates are unacceptable for Xpert Xpress SARS-CoV-2/FLU/RSV  testing.  Fact Sheet for Patients: https://www.moore.com/  Fact Sheet for Healthcare Providers: https://www.young.biz/  This test is not yet approved or cleared by the [place] FDA and  has been authorized for detection and/or diagnosis of SARS-CoV-2 by  FDA under an Emergency Use Authorization (EUA). This EUA will remain  in effect (meaning this test can be used) for the duration of the  Covid-19 declaration under Section 564(b)(1) of the Act, 21  U.S.C. section 360bbb-3(b)(1), unless the authorization is  terminated or revoked. Performed at [facility], [address], [place], [place] [address]   MRSA PCR Screening     Status: None   Collection Time: 11/28/19  2:36 AM   Specimen: Nasal Mucosa; Nasopharyngeal  Result Value Ref Range  Status   MRSA by PCR NEGATIVE NEGATIVE Final    Comment:        The GeneXpert MRSA Assay (FDA approved for NASAL specimens only), is one component of a comprehensive MRSA colonization surveillance program. It is not intended to diagnose MRSA infection nor to guide or monitor treatment for MRSA infections. Performed at [facility], [address], [place], [place] [address]   Culture, blood (Routine X 2) w Reflex to ID Panel     Status: None (Preliminary result)   Collection Time: 11/28/19  2:15 PM   Specimen: BLOOD  Result Value Ref Range Status   Specimen Description BLOOD RIGHT ANTECUBITAL  Final   Special Requests   Final    BOTTLES DRAWN AEROBIC AND ANAEROBIC Blood Culture adequate volume  Culture   Final    NO GROWTH 4 DAYS Performed at [facility], [address], [place], [place] [address]    Report Status PENDING  Incomplete  Culture, blood (Routine X 2) w Reflex to ID Panel     Status: None (Preliminary result)   Collection Time: 11/28/19  2:20 PM   Specimen: BLOOD RIGHT HAND  Result Value Ref Range Status   Specimen Description BLOOD RIGHT HAND  Final   Special Requests   Final    BOTTLES DRAWN AEROBIC AND ANAEROBIC Blood Culture adequate volume   Culture   Final    NO GROWTH 4 DAYS Performed at [facility], [address], [place], [place] [address]    Report Status PENDING  Incomplete    Radiology Studies: DG FEMUR PORT, MIN 2 VIEWS RIGHT  Result Date: 11/30/2019 CLINICAL DATA:  History of prior femoral fracture EXAM: RIGHT FEMUR PORTABLE 2 VIEW COMPARISON:  09/10/2019 FINDINGS: Right hip prosthesis is again seen. Healing proximal femoral fracture is noted with callus formation slightly increased when compared with prior exam. Acute fracture is seen. No soft tissue abnormality is noted. Diffuse vascular calcifications are seen. IMPRESSION: Continued healing in proximal right femoral fracture. No new focal abnormality is seen. Electronically  Signed   By: Alcide Clever M.D.   On: 11/30/2019 15:29   Scheduled Meds: . amLODipine  2.5 mg Oral Daily  . apixaban  2.5 mg Oral BID  . Chlorhexidine Gluconate Cloth  6 each Topical Q0600  . citalopram  20 mg Oral Daily  . fentaNYL (SUBLIMAZE) injection  25 mcg Intravenous Once  . furosemide  40 mg Oral Daily  . hydrALAZINE  25 mg Oral Q8H  . LORazepam  0.5 mg Oral QHS  . mouth rinse  15 mL Mouth Rinse BID  . psyllium  1 packet Oral Daily  . ziprasidone  20 mg Oral Q1400   Continuous Infusions: . cefTRIAXone (ROCEPHIN)  IV 200 mL/hr at 12/01/19 0900     LOS: 4 days   Time spent:  Azucena Fallen, DO [facility]  If 7PM-7AM, please contact night-coverage [email or web address]  12/02/2019, 7:31 AM

## 2019-12-03 DIAGNOSIS — I480 Paroxysmal atrial fibrillation: Secondary | ICD-10-CM | POA: Diagnosis not present

## 2019-12-03 DIAGNOSIS — R001 Bradycardia, unspecified: Secondary | ICD-10-CM | POA: Diagnosis not present

## 2019-12-03 LAB — BASIC METABOLIC PANEL
Anion gap: 11 (ref 5–15)
BUN: 19 mg/dL (ref 8–23)
CO2: 26 mmol/L (ref 22–32)
Calcium: 8.6 mg/dL — ABNORMAL LOW (ref 8.9–10.3)
Chloride: 102 mmol/L (ref 98–111)
Creatinine, Ser: 1.13 mg/dL — ABNORMAL HIGH (ref 0.44–1.00)
GFR calc non Af Amer: 42 mL/min — ABNORMAL LOW (ref >60)
Glucose, Bld: 98 mg/dL (ref 70–99)
Potassium: 3.5 mmol/L (ref 3.5–5.1)
Sodium: 139 mmol/L (ref 135–145)

## 2019-12-03 LAB — CBC
HCT: 32.5 % — ABNORMAL LOW (ref 36.0–46.0)
Hemoglobin: 10.6 g/dL — ABNORMAL LOW (ref 12.0–15.0)
MCH: 31.4 pg (ref 26.0–34.0)
MCHC: 32.6 g/dL (ref 30.0–36.0)
MCV: 96.2 fL (ref 80.0–100.0)
Platelets: 240 10*3/uL (ref 150–400)
RBC: 3.38 MIL/uL — ABNORMAL LOW (ref 3.87–5.11)
RDW: 15.8 % — ABNORMAL HIGH (ref 11.5–15.5)
WBC: 7.1 10*3/uL (ref 4.0–10.5)
nRBC: 0 % (ref 0.0–0.2)

## 2019-12-03 LAB — SARS CORONAVIRUS 2 BY RT PCR (HOSPITAL ORDER, PERFORMED IN ~~LOC~~ HOSPITAL LAB): SARS Coronavirus 2: NEGATIVE

## 2019-12-03 LAB — CULTURE, BLOOD (ROUTINE X 2)
Culture: NO GROWTH
Culture: NO GROWTH
Special Requests: ADEQUATE
Special Requests: ADEQUATE

## 2019-12-03 MED ORDER — HYDRALAZINE HCL 25 MG PO TABS
25.0000 mg | ORAL_TABLET | Freq: Three times a day (TID) | ORAL | 0 refills | Status: DC
Start: 2019-12-03 — End: 2020-03-22

## 2019-12-03 MED ORDER — METOPROLOL TARTRATE 25 MG PO TABS
12.5000 mg | ORAL_TABLET | Freq: Two times a day (BID) | ORAL | 0 refills | Status: DC
Start: 2019-12-03 — End: 2019-12-21

## 2019-12-03 MED ORDER — FUROSEMIDE 40 MG PO TABS
40.0000 mg | ORAL_TABLET | Freq: Every day | ORAL | 0 refills | Status: DC
Start: 2019-12-04 — End: 2020-10-18

## 2019-12-03 NOTE — Discharge Summary (Signed)
Physician Discharge Summary  Knowledge Leaders Aguas UEK:[id number] DOB: [birth year] DOA: 11/27/2019  PCP: Rodrigo Ran, MD  Admit date: 11/27/2019 Discharge date: 12/03/2019  Admitted From: [name] Disposition:  Same  Recommendations for Outpatient Follow-up:  1. Follow up with PCP in 1-2 weeks 2. Please obtain BMP/CBC in one week 3. Please follow up on the following pending results:  Home Health: Resume home health at assisted living Equipment/Devices: None  Discharge Condition: Stable CODE STATUS: DNR Diet recommendation: Low-salt low-fat diet  Brief/Interim Summary: Yolanda Baker a 86 y.o.femalewith medical history significant ofCOPD, atrial fibrillation on Eliquis, with a history of stroke, chronic diastolic congestive heart failure. Patient seen today for generalized weakness brought from facility after feeling tired and found to have low heart rate by physical therapist. Daughter at bedside Ms. Candy she also wants me to order an x-ray of her right hip for follow-up for healing per orthopedic recommendation.Patient today is alert awake oriented cooperative and facility is essentially bedbound and needs total assist. In ED: Blood pressure (!) 121/53, pulse (!) 143, temperature 98.9 F (37.2 C), temperature source Oral, resp. rate 14, SpO2 97 %. Pt Given 1/2 ns 500 ml bolus. cardiology consulted by edmd with rec for stopping metoprolol and amiodarone.   Patient admitted for general weakness found to be bradycardic, ICU took patient to their service given ongoing need for isoproterenol and initially requiring external pacing, acute hypoxia and worsening volume status concerning for heart failure exacerbation.  Isoproterenol weaned off, heart rate no longer bradycardic, transferred back to hospitalist service.12/02/2019 patient noted to be back in A. fib with mild RVR now rate 100-110; after reinitiation of low-dose metoprolol at 12.5 twice daily patient's heart rate is markedly  more well controlled.  Patient otherwise stable and agreeable for discharge to previous ALF for ongoing PT per our physical therapy recommendations.  Discharge Diagnoses:  Active Problems:   Essential hypertension   Atrial flutter (HCC)   Closed femur fracture (HCC)   DNR (do not resuscitate) present on admission   Bradycardia    Discharge Instructions  Discharge Instructions    Call MD for:  difficulty breathing, headache or visual disturbances   Complete by: As directed    Diet - low sodium heart healthy   Complete by: As directed    Increase activity slowly   Complete by: As directed      Allergies as of 12/03/2019      Reactions   Flagyl [metronidazole] Other (See Comments)   Fingers get "numb"      Medication List    STOP taking these medications   amiodarone 200 MG tablet Commonly known as: PACERONE   HYDROcodone-acetaminophen 5-325 MG tablet Commonly known as: NORCO/VICODIN   simvastatin 10 MG tablet Commonly known as: ZOCOR     TAKE these medications   acetaminophen 500 MG tablet Commonly known as: TYLENOL Take 1 tablet (500 mg total) by mouth 2 (two) times daily as needed for mild pain or moderate pain.   amLODipine 2.5 MG tablet Commonly known as: NORVASC Take 1 tablet (2.5 mg total) by mouth daily.   apixaban 2.5 MG Tabs tablet Commonly known as: ELIQUIS Take 1 tablet (2.5 mg total) by mouth 2 (two) times daily.   CERTAVITE SENIOR/ANTIOXIDANT PO Take 1 tablet by mouth daily.   chlorhexidine 0.12 % solution Commonly known as: PERIDEX Use as directed 15 mLs in the mouth or throat 2 (two) times daily. Swish and spit   citalopram 20 MG tablet Commonly  known as: CELEXA Take 1 tablet (20 mg total) by mouth daily.   furosemide 40 MG tablet Commonly known as: LASIX Take 1 tablet (40 mg total) by mouth daily. Start taking on: December 04, 2019 What changed:   medication strength  how much to take   galantamine 8 MG 24 hr capsule Commonly  known as: RAZADYNE ER Take 8 mg by mouth daily with breakfast.   hydrALAZINE 25 MG tablet Commonly known as: APRESOLINE Take 1 tablet (25 mg total) by mouth every 8 (eight) hours.   ipratropium-albuterol 0.5-2.5 (3) MG/3ML Soln Commonly known as: DUONEB Take 3 mLs by nebulization every 6 (six) hours as needed (shortness of breath).   loperamide 2 MG capsule Commonly known as: IMODIUM Take 2 mg by mouth 3 (three) times daily as needed for diarrhea or loose stools.   metoprolol tartrate 25 MG tablet Commonly known as: LOPRESSOR Take 0.5 tablets (12.5 mg total) by mouth 2 (two) times daily. What changed: how much to take   potassium chloride SA 20 MEQ tablet Commonly known as: KLOR-CON Take 20 mEq by mouth every other day.   psyllium 0.52 g capsule Commonly known as: REGULOID Take 0.52 g by mouth daily.   Stiolto Respimat 2.5-2.5 MCG/ACT Aers Generic drug: Tiotropium Bromide-Olodaterol Inhale 2 puffs into the lungs daily.   Systane 0.4-0.3 % Soln Generic drug: Polyethyl Glycol-Propyl Glycol Place 1 drop into both eyes every hour as needed (dry eyes/irritation).   ziprasidone 20 MG capsule Commonly known as: GEODON TAKE ONE CAPSULE BY MOUTH DAILY IN THE AFTERNOON **DO NOT CRUSH** **NO REFILLS** What changed: See the new instructions.       Allergies  Allergen Reactions  . Flagyl [Metronidazole] Other (See Comments)    Fingers get "numb"    Consultations:  Cardiology, PCCM   Procedures/Studies: DG CHEST PORT 1 VIEW  Result Date: 11/28/2019 CLINICAL DATA:  Hypoxemia EXAM: PORTABLE CHEST 1 VIEW COMPARISON:  11/27/2019 FINDINGS: There is new diffuse interstitial pulmonary opacity and layering pleural effusions with associated atelectasis or consolidation. Cardiomegaly. IMPRESSION: New diffuse interstitial pulmonary opacity and layering pleural effusions with associated atelectasis or consolidation, consistent with edema in the setting of cardiomegaly. Electronically  Signed   By: Lauralyn Primes M.D.   On: 11/28/2019 18:52   DG Chest Portable 1 View  Result Date: 11/27/2019 CLINICAL DATA:  Shortness of breath.  Atrial fibrillation EXAM: PORTABLE CHEST 1 VIEW COMPARISON:  October 16, 2019 chest radiograph and CT angiogram chest FINDINGS: Lungs are clear. There is cardiomegaly with pulmonary vascularity normal. No adenopathy. No pneumothorax. No bone lesions. IMPRESSION: Cardiomegaly.  Lungs clear. Electronically Signed   By: Bretta Bang III M.D.   On: 11/27/2019 13:39   DG FEMUR PORT, MIN 2 VIEWS RIGHT  Result Date: 11/30/2019 CLINICAL DATA:  History of prior femoral fracture EXAM: RIGHT FEMUR PORTABLE 2 VIEW COMPARISON:  09/10/2019 FINDINGS: Right hip prosthesis is again seen. Healing proximal femoral fracture is noted with callus formation slightly increased when compared with prior exam. Acute fracture is seen. No soft tissue abnormality is noted. Diffuse vascular calcifications are seen. IMPRESSION: Continued healing in proximal right femoral fracture. No new focal abnormality is seen. Electronically Signed   By: Alcide Clever M.D.   On: 11/30/2019 15:29     Subjective: No acute issues or events overnight, this morning patient was reported to be somewhat altered per family however upon evaluation patient was ANO x4, unclear if transient episode of confusion due to somnolence and poor sleep.  Discharge Exam: Vitals:   12/03/19 0900 12/03/19 1117  BP: (!) 145/93 135/78  Pulse:  98  Resp: 19 (!) 24  Temp:  98.1 F (36.7 C)  SpO2:  98%   Vitals:   12/03/19 0725 12/03/19 0800 12/03/19 0900 12/03/19 1117  BP: 124/65 114/73 (!) 145/93 135/78  Pulse: 98   98  Resp: 20 (!) 24 19 (!) 24  Temp: 98.5 F (36.9 C)   98.1 F (36.7 C)  TempSrc: Oral   Oral  SpO2: 92%   98%  Weight:      Height:        General: Pt is alert, awake, not in acute distress Cardiovascular: RRR, S1/S2 +, no rubs, no gallops Respiratory: CTA bilaterally, no wheezing, no  rhonchi Abdominal: Soft, NT, ND, bowel sounds + Extremities: no edema, no cyanosis    The results of significant diagnostics from this hospitalization (including imaging, microbiology, ancillary and laboratory) are listed below for reference.     Microbiology: Recent Results (from the past 240 hour(s))  Respiratory Panel by RT PCR (Flu A&B, Covid) - Nasopharyngeal Swab     Status: None   Collection Time: 11/27/19  2:09 PM   Specimen: Nasopharyngeal Swab  Result Value Ref Range Status   SARS Coronavirus 2 by RT PCR NEGATIVE NEGATIVE Final    Comment: (NOTE) SARS-CoV-2 target nucleic acids are NOT DETECTED.  The SARS-CoV-2 RNA is generally detectable in upper respiratoy specimens during the acute phase of infection. The lowest concentration of SARS-CoV-2 viral copies this assay can detect is 131 copies/mL. A negative result does not preclude SARS-Cov-2 infection and should not be used as the sole basis for treatment or other patient management decisions. A negative result may occur with  improper specimen collection/handling, submission of specimen other than nasopharyngeal swab, presence of viral mutation(s) within the areas targeted by this assay, and inadequate number of viral copies (<131 copies/mL). A negative result must be combined with clinical observations, patient history, and epidemiological information. The expected result is Negative.  Fact Sheet for Patients:  https://www.moore.com/  Fact Sheet for Healthcare Providers:  https://www.young.biz/  This test is no t yet approved or cleared by the [place] FDA and  has been authorized for detection and/or diagnosis of SARS-CoV-2 by FDA under an Emergency Use Authorization (EUA). This EUA will remain  in effect (meaning this test can be used) for the duration of the COVID-19 declaration under Section 564(b)(1) of the Act, 21 U.S.C. section 360bbb-3(b)(1), unless the  authorization is terminated or revoked sooner.     Influenza A by PCR NEGATIVE NEGATIVE Final   Influenza B by PCR NEGATIVE NEGATIVE Final    Comment: (NOTE) The Xpert Xpress SARS-CoV-2/FLU/RSV assay is intended as an aid in  the diagnosis of influenza from Nasopharyngeal swab specimens and  should not be used as a sole basis for treatment. Nasal washings and  aspirates are unacceptable for Xpert Xpress SARS-CoV-2/FLU/RSV  testing.  Fact Sheet for Patients: https://www.moore.com/  Fact Sheet for Healthcare Providers: https://www.young.biz/  This test is not yet approved or cleared by the [place] FDA and  has been authorized for detection and/or diagnosis of SARS-CoV-2 by  FDA under an Emergency Use Authorization (EUA). This EUA will remain  in effect (meaning this test can be used) for the duration of the  Covid-19 declaration under Section 564(b)(1) of the Act, 21  U.S.C. section 360bbb-3(b)(1), unless the authorization is  terminated or revoked. Performed at [facility], [address], [place], [place] [address]   MRSA PCR Screening     Status: None   Collection Time: 11/28/19  2:36 AM   Specimen: Nasal Mucosa; Nasopharyngeal  Result Value Ref Range Status   MRSA by PCR NEGATIVE NEGATIVE Final    Comment:        The GeneXpert MRSA Assay (FDA approved for NASAL specimens only), is one component of a comprehensive MRSA colonization surveillance program. It is not intended to diagnose MRSA infection nor to guide or monitor treatment for MRSA infections. Performed at [facility], [address] [place], [place] [address]   Culture, blood (Routine X 2) w Reflex to ID Panel     Status: None (Preliminary result)   Collection Time: 11/28/19  2:15 PM   Specimen: BLOOD  Result Value Ref Range Status   Specimen Description BLOOD RIGHT ANTECUBITAL  Final   Special Requests   Final    BOTTLES DRAWN AEROBIC AND  ANAEROBIC Blood Culture adequate volume   Culture   Final    NO GROWTH 4 DAYS Performed at [facility], [address], [place], [place] [address]    Report Status PENDING  Incomplete  Culture, blood (Routine X 2) w Reflex to ID Panel     Status: None (Preliminary result)   Collection Time: 11/28/19  2:20 PM   Specimen: BLOOD RIGHT HAND  Result Value Ref Range Status   Specimen Description BLOOD RIGHT HAND  Final   Special Requests   Final    BOTTLES DRAWN AEROBIC AND ANAEROBIC Blood Culture adequate volume   Culture   Final    NO GROWTH 4 DAYS Performed at [facility], [address], [place], [place] [address]    Report Status PENDING  Incomplete     Labs: BNP (last 3 results) Recent Labs    05/14/19 0050 09/10/19 1736 10/16/19 1306  BNP 627.9* 596.7* 415.6*   Basic Metabolic Panel: Recent Labs  Lab 11/27/19 1341 11/27/19 1341 11/28/19 0113 11/29/19 0554 11/30/19 0111 12/02/19 0836 12/03/19 0044  NA 138   < > 138 137 138 139 139  K 3.6   < > 3.4* 3.5 3.8 3.1* 3.5  CL 101   < > 102 100 97* 99 102  CO2 26   < > 24 24 32 27 26  GLUCOSE 103*   < > 136* 139* 113* 111* 98  BUN 31*   < > 40* 33* 27* 25* 19  CREATININE 3.02*   < > 3.73* 2.24* 1.60* 1.33* 1.13*  CALCIUM 8.3*   < > 8.5* 8.3* 8.3* 8.8* 8.6*  MG 1.9  --   --   --   --   --   --    < > = values in this interval not displayed.   Liver Function Tests: Recent Labs  Lab 11/28/19 0113  AST 46*  ALT 33  ALKPHOS 64  BILITOT 0.6  PROT 6.3*  ALBUMIN 3.2*   No results for input(s): LIPASE, AMYLASE in the last 168 hours. No results for input(s): AMMONIA in the last 168 hours. CBC: Recent Labs  Lab 11/27/19 1341 11/28/19 0113 11/29/19 0554 11/30/19 0111 12/01/19 0123 12/02/19 0102 12/03/19 0044  WBC 9.1   < > 15.7* 12.3* 9.5 8.7 7.1  NEUTROABS 6.0  --   --   --   --   --   --   HGB 10.8*   < > 10.3* 10.7* 10.2* 10.3* 10.6*  HCT 33.3*   < > 31.5* 32.9* 32.0* 32.1* 32.5*  MCV 97.7   <  > 95.5 95.1 96.4 95.8 96.2  PLT 232   < > 207 212 212 219 240   < > = values in this interval not displayed.   Cardiac Enzymes: Recent Labs  Lab 11/28/19 0113  CKTOTAL 54   BNP: Invalid input(s): POCBNP CBG: Recent Labs  Lab 11/27/19 2249  GLUCAP 131*   D-Dimer No results for input(s): DDIMER in the last 72 hours. Hgb A1c No results for input(s): HGBA1C in the last 72 hours. Lipid Profile No results for input(s): CHOL, HDL, LDLCALC, TRIG, CHOLHDL, LDLDIRECT in the last 72 hours. Thyroid function studies No results for input(s): TSH, T4TOTAL, T3FREE, THYROIDAB in the last 72 hours.  Invalid input(s): FREET3 Anemia work up No results for input(s): VITAMINB12, FOLATE, FERRITIN, TIBC, IRON, RETICCTPCT in the last 72 hours. Urinalysis    Component Value Date/Time   COLORURINE YELLOW 11/28/2019 1500   APPEARANCEUR HAZY (A) 11/28/2019 1500   LABSPEC 1.012 11/28/2019 1500   PHURINE 5.0 11/28/2019 1500   GLUCOSEU NEGATIVE 11/28/2019 1500   HGBUR SMALL (A) 11/28/2019 1500   BILIRUBINUR NEGATIVE 11/28/2019 1500   KETONESUR NEGATIVE 11/28/2019 1500   PROTEINUR 30 (A) 11/28/2019 1500   UROBILINOGEN 0.2 06/29/2014 0943   NITRITE NEGATIVE 11/28/2019 1500   LEUKOCYTESUR NEGATIVE 11/28/2019 1500   Sepsis Labs Invalid input(s): PROCALCITONIN,  WBC,  LACTICIDVEN Microbiology Recent Results (from the past 240 hour(s))  Respiratory Panel by RT PCR (Flu A&B, Covid) - Nasopharyngeal Swab     Status: None   Collection Time: 11/27/19  2:09 PM   Specimen: Nasopharyngeal Swab  Result Value Ref Range Status   SARS Coronavirus 2 by RT PCR NEGATIVE NEGATIVE Final    Comment: (NOTE) SARS-CoV-2 target nucleic acids are NOT DETECTED.  The SARS-CoV-2 RNA is generally detectable in upper respiratoy specimens during the acute phase of infection. The lowest concentration of SARS-CoV-2 viral copies this assay can detect is 131 copies/mL. A negative result does not preclude  SARS-Cov-2 infection and should not be used as the sole basis for treatment or other patient management decisions. A negative result may occur with  improper specimen collection/handling, submission of specimen other than nasopharyngeal swab, presence of viral mutation(s) within the areas targeted by this assay, and inadequate number of viral copies (<131 copies/mL). A negative result must be combined with clinical observations, patient history, and epidemiological information. The expected result is Negative.  Fact Sheet for Patients:  https://www.moore.com/  Fact Sheet for Healthcare Providers:  https://www.young.biz/  This test is no t yet approved or cleared by the [place] FDA and  has been authorized for detection and/or diagnosis of SARS-CoV-2 by FDA under an Emergency Use Authorization (EUA). This EUA will remain  in effect (meaning this test can be used) for the duration of the COVID-19 declaration under Section 564(b)(1) of the Act, 21 U.S.C. section 360bbb-3(b)(1), unless the authorization is terminated or revoked sooner.     Influenza A by PCR NEGATIVE NEGATIVE Final   Influenza B by PCR NEGATIVE NEGATIVE Final    Comment: (NOTE) The Xpert Xpress SARS-CoV-2/FLU/RSV assay is intended as an aid in  the diagnosis of influenza from Nasopharyngeal swab specimens and  should not be used as a sole basis for treatment. Nasal washings and  aspirates are unacceptable for Xpert Xpress SARS-CoV-2/FLU/RSV  testing.  Fact Sheet for Patients: https://www.moore.com/  Fact Sheet for Healthcare Providers: https://www.young.biz/  This  test is not yet approved or cleared by the [place] and  has been authorized for detection and/or diagnosis of SARS-CoV-2 by  FDA under an Emergency Use Authorization (EUA). This EUA will remain  in effect (meaning this test can be used) for the duration of the   Covid-19 declaration under Section 564(b)(1) of the Act, 21  U.S.C. section 360bbb-3(b)(1), unless the authorization is  terminated or revoked. Performed at [facility], [address] [place], [place] [address]   MRSA PCR Screening     Status: None   Collection Time: 11/28/19  2:36 AM   Specimen: Nasal Mucosa; Nasopharyngeal  Result Value Ref Range Status   MRSA by PCR NEGATIVE NEGATIVE Final    Comment:        The GeneXpert MRSA Assay (FDA approved for NASAL specimens only), is one component of a comprehensive MRSA colonization surveillance program. It is not intended to diagnose MRSA infection nor to guide or monitor treatment for MRSA infections. Performed at [facility], [address] [place], [place] [address]   Culture, blood (Routine X 2) w Reflex to ID Panel     Status: None (Preliminary result)   Collection Time: 11/28/19  2:15 PM   Specimen: BLOOD  Result Value Ref Range Status   Specimen Description BLOOD RIGHT ANTECUBITAL  Final   Special Requests   Final    BOTTLES DRAWN AEROBIC AND ANAEROBIC Blood Culture adequate volume   Culture   Final    NO GROWTH 4 DAYS Performed at [facility], [address], [place], [place] [address]    Report Status PENDING  Incomplete  Culture, blood (Routine X 2) w Reflex to ID Panel     Status: None (Preliminary result)   Collection Time: 11/28/19  2:20 PM   Specimen: BLOOD RIGHT HAND  Result Value Ref Range Status   Specimen Description BLOOD RIGHT HAND  Final   Special Requests   Final    BOTTLES DRAWN AEROBIC AND ANAEROBIC Blood Culture adequate volume   Culture   Final    NO GROWTH 4 DAYS Performed at [facility], [address] [place], [place] [address]    Report Status PENDING  Incomplete     Time coordinating discharge: Over 30 minutes  SIGNED:   Azucena Fallen, DO [facility] 12/03/2019, 12:40 PM Pager   If 7PM-7AM, please contact  night-coverage [email or web address]

## 2019-12-03 NOTE — Evaluation (Signed)
Physical Therapy Evaluation Patient Details Name: Yolanda Baker MRN: [id number] DOB: January 11, 1924 Today's Date: 12/03/2019   History of Present Illness  Pt is a 86 y/o female admitted secondary to bradycardia and a fib. Pt with R hip fx in may. PMH includes dementia, HTN, COPD, a fib, CVA, CHF, and COPD.   Clinical Impression  Pt admitted secondary to problem above with deficits below. Pt requiring total A to roll from side to side for clean up following BM. Per daughter, pt was at ALF and was total A for transfers and bathing/dressing tasks. Was able to feed herself and per tech, pt has been feeding herself while in the hospital with minimal assist. Pt currently at her baseline and pt's daughter reports plan is to return to ALF. Recommend resuming PT services at ALF. No further acute PT needs. Will sign off. If needs change, please re-consult.    Follow Up Recommendations Home health PT (resume HHPT at ALF )    Equipment Recommendations  None recommended by PT    Recommendations for Other Services       Precautions / Restrictions Precautions Precautions: Fall Restrictions Weight Bearing Restrictions: Yes RLE Weight Bearing: Non weight bearing      Mobility  Bed Mobility Overal bed mobility: Needs Assistance Bed Mobility: Rolling Rolling: Total assist         General bed mobility comments: Total A to roll for clean up following BM. Pt did attempt to reach with UEs to assist. Total A +2 for repositioning in bed.   Transfers                    Ambulation/Gait                Stairs            Wheelchair Mobility    Modified Rankin (Stroke Patients Only)       Balance                                             Pertinent Vitals/Pain Pain Assessment: Faces Faces Pain Scale: No hurt    Home Living Family/patient expects to be discharged to:: Assisted living                      Prior Function Level of Independence:  Needs assistance   Gait / Transfers Assistance Needed: nonambulatory since 04/2019. Requires total A for transfers per daughter  ADL's / Homemaking Assistance Needed: Requires total A for ADL tasks except feeding per daughter.         Hand Dominance        Extremity/Trunk Assessment   Upper Extremity Assessment Upper Extremity Assessment: Generalized weakness    Lower Extremity Assessment Lower Extremity Assessment: Generalized weakness;RLE deficits/detail RLE Deficits / Details: R hip fx at baseline.        Communication   Communication: No difficulties  Cognition Arousal/Alertness: Awake/alert Behavior During Therapy: WFL for tasks assessed/performed Overall Cognitive Status: History of cognitive impairments - at baseline                                 General Comments: Dementia at baseline       General Comments General comments (skin integrity, edema, etc.): Daughter present during session  Exercises     Assessment/Plan    PT Assessment Patent does not need any further PT services;All further PT needs can be met in the next venue of care  PT Problem List Decreased cognition;Decreased safety awareness;Decreased balance;Decreased mobility;Decreased strength       PT Treatment Interventions      PT Goals (Current goals can be found in the Care Plan section)  Acute Rehab PT Goals Patient Stated Goal: to return to ALF per daughter PT Goal Formulation: With family Time For Goal Achievement: 12/03/19 Potential to Achieve Goals: Fair    Frequency     Barriers to discharge        Co-evaluation               AM-PAC PT "6 Clicks" Mobility  Outcome Measure Help needed turning from your back to your side while in a flat bed without using bedrails?: Total Help needed moving from lying on your back to sitting on the side of a flat bed without using bedrails?: Total Help needed moving to and from a bed to a chair (including a  wheelchair)?: Total Help needed standing up from a chair using your arms (e.g., wheelchair or bedside chair)?: Total Help needed to walk in hospital room?: Total Help needed climbing 3-5 steps with a railing? : Total 6 Click Score: 6    End of Session   Activity Tolerance: Patient tolerated treatment well Patient left: in bed;with call bell/phone within reach Nurse Communication: Mobility status PT Visit Diagnosis: Difficulty in walking, not elsewhere classified (R26.2)    Time: 6286-3817 PT Time Calculation (min) (ACUTE ONLY): 12 min   Charges:   PT Evaluation $PT Eval Moderate Complexity: 1 Mod          Yolanda Baker, DPT  Acute Rehabilitation Services  Pager: [phone] Office: [phone]   Rudean Hitt 12/03/2019, 11:51 AM

## 2019-12-03 NOTE — NC FL2 (Signed)
[place] MEDICAID FL2 LEVEL OF CARE SCREENING TOOL     IDENTIFICATION  Patient Name: Yolanda Baker Birthdate: Mar 29, 1923 Sex: female Admission Date (Current Location): 11/27/2019  [facility] and [place] Number:  Producer, television/film/video and Address:  [facility], [address] [place], [place] [address]      Provider Number: [id number]  Attending Physician Name and Address:  Azucena Fallen, MD  Relative Name and Phone Number:  Janne Napoleon [phone]    Current Level of Care: SNF ([facility]) Recommended Level of Care: Assisted Living Facility Prior Approval Number:    Date Approved/Denied:   PASRR Number:    Discharge Plan:  (ALF)    Current Diagnoses: Patient Active Problem List   Diagnosis Date Noted  . Bradycardia 11/27/2019  . Hypoxia   . Acute respiratory failure with hypoxia (HCC) 10/16/2019  . Persistent atrial fibrillation (HCC) 09/25/2019  . Secondary hypercoagulable state (HCC) 09/25/2019  . Atrial fibrillation with RVR (HCC) 09/11/2019  . PAF (paroxysmal atrial fibrillation) (HCC) 08/04/2019  . Goals of care, counseling/discussion   . Palliative care by specialist   . Pressure injury of skin 05/07/2019  . Periprosthetic fracture around internal prosthetic hip joint 05/06/2019  . DNR (do not resuscitate) present on admission 02/04/2019  . Acute on chronic diastolic heart failure (HCC)   . GAD (generalized anxiety disorder) 01/15/2018  . Schizoaffective disorder (HCC) 01/15/2018  . Hyperlipidemia LDL goal <70 07/01/2014  . Constipation chronic 07/01/2014  . Acute CVA (cerebrovascular accident) (HCC)   . Acute blood loss anemia 10/12/2013  . Postoperative anemia due to acute blood loss 10/04/2013  . Postop Transfusion 10/04/2013  . Closed fracture of femur with nonunion 10/03/2013  . Femur fracture, right (HCC) 10/03/2013  . Closed femur fracture (HCC) 10/03/2013  . UTI (urinary tract infection) 06/23/2013  .  Dementia (HCC) 06/09/2013  . Anxiety 06/09/2013  . Atrial flutter (HCC) 06/07/2013  . Hip fracture (HCC) 06/04/2013  . Depression 06/04/2013  . Anxiety state 06/04/2013  . Essential hypertension 06/04/2013  . Closed right hip fracture (HCC) 06/04/2013    Orientation RESPIRATION BLADDER Height & Weight     Self, Time, Situation, Place  Normal Incontinent, External catheter Weight: 105 lb 9.6 oz (47.9 kg) Height:  5\' 2"  (157.5 cm)  BEHAVIORAL SYMPTOMS/MOOD NEUROLOGICAL BOWEL NUTRITION STATUS      Incontinent Diet  Low fat, low salt    AMBULATORY STATUS COMMUNICATION OF NEEDS Skin   Extensive Assist Verbally Normal                       Personal Care Assistance Level of Assistance  Bathing, Feeding, Dressing Bathing Assistance: Maximum assistance Feeding assistance: Limited assistance Dressing Assistance: Limited assistance     Functional Limitations Info  Sight, Hearing, Speech Sight Info: Adequate Hearing Info: Adequate      SPECIAL CARE FACTORS FREQUENCY  PT (By licensed PT), OT (By licensed OT)     PT Frequency: 3x week OT Frequency: 3x week            Contractures Contractures Info: Not present    Additional Factors Info  Code Status, Allergies, Psychotropic Code Status Info: DNR Allergies Info: Flagyl Metronidazole Psychotropic Info: LORazepam (ATIVAN) tablet 0.5 mg daily at bedtime,            Discharge Medications: TAKE these medications   acetaminophen 500 MG tablet Commonly known as: TYLENOL Take 1 tablet (500 mg total) by  mouth 2 (two) times daily as needed for mild pain or moderate pain.   amLODipine 2.5 MG tablet Commonly known as: NORVASC Take 1 tablet (2.5 mg total) by mouth daily.   apixaban 2.5 MG Tabs tablet Commonly known as: ELIQUIS Take 1 tablet (2.5 mg total) by mouth 2 (two) times daily.   CERTAVITE SENIOR/ANTIOXIDANT PO Take 1 tablet by mouth daily.   chlorhexidine 0.12 % solution Commonly known as:  PERIDEX Use as directed 15 mLs in the mouth or throat 2 (two) times daily. Swish and spit   citalopram 20 MG tablet Commonly known as: CELEXA Take 1 tablet (20 mg total) by mouth daily.   furosemide 40 MG tablet Commonly known as: LASIX Take 1 tablet (40 mg total) by mouth daily. Start taking on: December 04, 2019 What changed:   medication strength  how much to take   galantamine 8 MG 24 hr capsule Commonly known as: RAZADYNE ER Take 8 mg by mouth daily with breakfast.   hydrALAZINE 25 MG tablet Commonly known as: APRESOLINE Take 1 tablet (25 mg total) by mouth every 8 (eight) hours.   ipratropium-albuterol 0.5-2.5 (3) MG/3ML Soln Commonly known as: DUONEB Take 3 mLs by nebulization every 6 (six) hours as needed (shortness of breath).   loperamide 2 MG capsule Commonly known as: IMODIUM Take 2 mg by mouth 3 (three) times daily as needed for diarrhea or loose stools.   metoprolol tartrate 25 MG tablet Commonly known as: LOPRESSOR Take 0.5 tablets (12.5 mg total) by mouth 2 (two) times daily. What changed: how much to take   potassium chloride SA 20 MEQ tablet Commonly known as: KLOR-CON Take 20 mEq by mouth every other day.   psyllium 0.52 g capsule Commonly known as: REGULOID Take 0.52 g by mouth daily.   Stiolto Respimat 2.5-2.5 MCG/ACT Aers Generic drug: Tiotropium Bromide-Olodaterol Inhale 2 puffs into the lungs daily.   Systane 0.4-0.3 % Soln Generic drug: Polyethyl Glycol-Propyl Glycol Place 1 drop into both eyes every hour as needed (dry eyes/irritation).   ziprasidone 20 MG capsule Commonly known as: GEODON TAKE ONE CAPSULE BY MOUTH DAILY IN THE AFTERNOON **DO NOT CRUSH** **NO REFILLS** What changed: See the new instructions.     Relevant Imaging Results:  Relevant Lab Results:   Additional Information    Lovell Roe B Timmey Lamba, LCSWA

## 2019-12-03 NOTE — NC FL2 (Signed)
[place] MEDICAID FL2 LEVEL OF CARE SCREENING TOOL     IDENTIFICATION  Patient Name: Yolanda Baker Floor Birthdate: 1923-06-29 Sex: female Admission Date (Current Location): 11/27/2019  [facility] and [place] Number:  Producer, television/film/video and Address:  [facility] [place], [address] [place], [place] [address]      Provider Number: [id number]  Attending Physician Name and Address:  Azucena Fallen, MD  Relative Name and Phone Number:  Janne Napoleon [phone]    Current Level of Care: SNF ([facility]) Recommended Level of Care: Assisted Living Facility Prior Approval Number:    Date Approved/Denied:   PASRR Number:    Discharge Plan:  (ALF)    Current Diagnoses: Patient Active Problem List   Diagnosis Date Noted  . Bradycardia 11/27/2019  . Hypoxia   . Acute respiratory failure with hypoxia (HCC) 10/16/2019  . Persistent atrial fibrillation (HCC) 09/25/2019  . Secondary hypercoagulable state (HCC) 09/25/2019  . Atrial fibrillation with RVR (HCC) 09/11/2019  . PAF (paroxysmal atrial fibrillation) (HCC) 08/04/2019  . Goals of care, counseling/discussion   . Palliative care by specialist   . Pressure injury of skin 05/07/2019  . Periprosthetic fracture around internal prosthetic hip joint 05/06/2019  . DNR (do not resuscitate) present on admission 02/04/2019  . Acute on chronic diastolic heart failure (HCC)   . GAD (generalized anxiety disorder) 01/15/2018  . Schizoaffective disorder (HCC) 01/15/2018  . Hyperlipidemia LDL goal <70 07/01/2014  . Constipation chronic 07/01/2014  . Acute CVA (cerebrovascular accident) (HCC)   . Acute blood loss anemia 10/12/2013  . Postoperative anemia due to acute blood loss 10/04/2013  . Postop Transfusion 10/04/2013  . Closed fracture of femur with nonunion 10/03/2013  . Femur fracture, right (HCC) 10/03/2013  . Closed femur fracture (HCC) 10/03/2013  . UTI (urinary tract infection) 06/23/2013  .  Dementia (HCC) 06/09/2013  . Anxiety 06/09/2013  . Atrial flutter (HCC) 06/07/2013  . Hip fracture (HCC) 06/04/2013  . Depression 06/04/2013  . Anxiety state 06/04/2013  . Essential hypertension 06/04/2013  . Closed right hip fracture (HCC) 06/04/2013    Orientation RESPIRATION BLADDER Height & Weight     Self, Time, Situation, Place  Normal Incontinent, External catheter Weight: 105 lb 9.6 oz (47.9 kg) Height:  5\' 2"  (157.5 cm)  BEHAVIORAL SYMPTOMS/MOOD NEUROLOGICAL BOWEL NUTRITION STATUS      Incontinent Diet  Low Sodium Diet   AMBULATORY STATUS COMMUNICATION OF NEEDS Skin   Extensive Assist Verbally Normal                       Personal Care Assistance Level of Assistance  Bathing, Feeding, Dressing Bathing Assistance: Maximum assistance Feeding assistance: Limited assistance Dressing Assistance: Limited assistance     Functional Limitations Info  Sight, Hearing, Speech Sight Info: Adequate Hearing Info: Adequate      SPECIAL CARE FACTORS FREQUENCY  PT (By licensed PT), OT (By licensed OT)     PT Frequency: 3x week OT Frequency: 3x week            Contractures Contractures Info: Not present    Additional Factors Info  Code Status, Allergies, Psychotropic Code Status Info: DNR Allergies Info: Flagyl Metronidazole Psychotropic Info: LORazepam (ATIVAN) tablet 0.5 mg daily at bedtime,            Discharge Medications: TAKE these medications   acetaminophen 500 MG tablet Commonly known as: TYLENOL Take 1 tablet (500 mg total) by mouth 2 (  two) times daily as needed for mild pain or moderate pain.   amLODipine 2.5 MG tablet Commonly known as: NORVASC Take 1 tablet (2.5 mg total) by mouth daily.   apixaban 2.5 MG Tabs tablet Commonly known as: ELIQUIS Take 1 tablet (2.5 mg total) by mouth 2 (two) times daily.   CERTAVITE SENIOR/ANTIOXIDANT PO Take 1 tablet by mouth daily.   chlorhexidine 0.12 % solution Commonly known as: PERIDEX Use  as directed 15 mLs in the mouth or throat 2 (two) times daily. Swish and spit   citalopram 20 MG tablet Commonly known as: CELEXA Take 1 tablet (20 mg total) by mouth daily.   furosemide 40 MG tablet Commonly known as: LASIX Take 1 tablet (40 mg total) by mouth daily. Start taking on: December 04, 2019 What changed:   medication strength  how much to take   galantamine 8 MG 24 hr capsule Commonly known as: RAZADYNE ER Take 8 mg by mouth daily with breakfast.   hydrALAZINE 25 MG tablet Commonly known as: APRESOLINE Take 1 tablet (25 mg total) by mouth every 8 (eight) hours.   ipratropium-albuterol 0.5-2.5 (3) MG/3ML Soln Commonly known as: DUONEB Take 3 mLs by nebulization every 6 (six) hours as needed (shortness of breath).   loperamide 2 MG capsule Commonly known as: IMODIUM Take 2 mg by mouth 3 (three) times daily as needed for diarrhea or loose stools.   metoprolol tartrate 25 MG tablet Commonly known as: LOPRESSOR Take 0.5 tablets (12.5 mg total) by mouth 2 (two) times daily. What changed: how much to take   potassium chloride SA 20 MEQ tablet Commonly known as: KLOR-CON Take 20 mEq by mouth every other day.   psyllium 0.52 g capsule Commonly known as: REGULOID Take 0.52 g by mouth daily.   Stiolto Respimat 2.5-2.5 MCG/ACT Aers Generic drug: Tiotropium Bromide-Olodaterol Inhale 2 puffs into the lungs daily.   Systane 0.4-0.3 % Soln Generic drug: Polyethyl Glycol-Propyl Glycol Place 1 drop into both eyes every hour as needed (dry eyes/irritation).   ziprasidone 20 MG capsule Commonly known as: GEODON TAKE ONE CAPSULE BY MOUTH DAILY IN THE AFTERNOON **DO NOT CRUSH** **NO REFILLS** What changed: See the new instructions.     Relevant Imaging Results:  Relevant Lab Results:   Additional Information    Elianis Fischbach B Kiera Hussey, LCSWA

## 2019-12-03 NOTE — TOC Transition Note (Signed)
Transition of Care [facility]) - CM/SW Discharge Note   Patient Details  Name: Yolanda Baker MRN: [id number] Date of Birth: [birth year]  [facility] CM/SW Contact:  Carmina Miller, LCSWA Phone Number: 12/03/2019, 12:23 PM   Clinical Narrative:    Patient will DC to: [name] Anticipated DC date: 12/03/19 Family notified: Janne Napoleon Transport by: [facility]   Per MD patient ready for DC to [facility]. RN to call report prior to discharge [phone]. RN, patient, patient's family, and facility notified of DC. Discharge Summary and FL2 sent to facility. DC packet on chart. Medical transport requested for patient.   CSW will sign off for now as social work intervention is no longer needed. Please consult [place] again if new needs arise.     Final next level of care: Assisted Living Barriers to Discharge: Barriers Resolved   Patient Goals and CMS Choice Patient states their goals for this hospitalization and ongoing recovery are:: Get better soon CMS Medicare.gov Compare Post Acute Care list provided to:: Patient Represenative (must comment) Brett Canales, son) Choice offered to / list presented to : Adult Children, Patient  Discharge Placement                  Name of family member notified: Candy Reavis Patient and family notified of of transfer: 12/03/19  Discharge Plan and Services In-house Referral: Clinical Social Work                                   Social Determinants of Health (SDOH) Interventions     Readmission Risk Interventions Readmission Risk Prevention Plan 05/07/2019 02/04/2019  Transportation Screening Complete Complete  PCP or Specialist Appt within 5-7 Days - Complete  PCP or Specialist Appt within 3-5 Days Complete -  Home Care Screening - Complete  Medication Review (RN CM) - Complete  HRI or Home Care Consult Complete -  Social Work Consult for Recovery Care Planning/Counseling Complete -  Palliative Care  Screening Complete -  Medication Review Oceanographer) Complete -  Some recent data might be hidden

## 2019-12-03 NOTE — Progress Notes (Signed)
Progress Note  Patient Name: Yolanda Baker Date of Encounter: 12/03/2019  [facility] Cardiologist: Chilton Si, MD   Subjective   Back into afib yesterday - some RVR overnight. No symptoms with this today.   Inpatient Medications    Scheduled Meds: . amLODipine  2.5 mg Oral Daily  . apixaban  2.5 mg Oral BID  . Chlorhexidine Gluconate Cloth  6 each Topical Q0600  . citalopram  20 mg Oral Daily  . fentaNYL (SUBLIMAZE) injection  25 mcg Intravenous Once  . furosemide  40 mg Oral Daily  . hydrALAZINE  25 mg Oral Q8H  . LORazepam  0.5 mg Oral QHS  . mouth rinse  15 mL Mouth Rinse BID  . metoprolol tartrate  12.5 mg Oral BID  . psyllium  1 packet Oral Daily  . ziprasidone  20 mg Oral Q1400   Continuous Infusions: . cefTRIAXone (ROCEPHIN)  IV Stopped (12/02/19 1200)   PRN Meds: hydrALAZINE, ipratropium-albuterol, polyvinyl alcohol   Vital Signs    Vitals:   12/03/19 0000 12/03/19 0300 12/03/19 0612 12/03/19 0725  BP: (!) 142/97 136/76 (!) 149/82 124/65  Pulse:  91  98  Resp: 20 15  20   Temp: 98.4 F (36.9 C) 98.3 F (36.8 C)  98.5 F (36.9 C)  TempSrc: Oral Oral  Oral  SpO2: 94% 93%  92%  Weight:      Height:        Intake/Output Summary (Last 24 hours) at 12/03/2019 0829 Last data filed at 12/02/2019 2156 Gross per 24 hour  Intake --  Output 600 ml  Net -600 ml   Last 3 Weights 11/30/2019 11/29/2019 11/27/2019  Weight (lbs) 105 lb 9.6 oz 111 lb 12.4 oz 111 lb 1.8 oz  Weight (kg) 47.9 kg 50.7 kg 50.4 kg      Telemetry    AFib with rates in the low 100's  Personally Reviewed  Physical Exam   GEN: No acute distress. Frail   Neck: supple, no JVD Cardiac: irregularly irregular Respiratory: Diminished BS bases GI: Soft, nontender, non-distended  MS: No edema; ecchymoses noted Neuro:  Nonfocal  Psych: Normal affect   Labs    High Sensitivity Troponin:   Recent Labs  Lab 11/27/19 1341 11/27/19 1544  TROPONINIHS 17 17       Chemistry Recent Labs  Lab 11/28/19 0113 11/28/19 0113 11/29/19 0554 11/29/19 0554 11/30/19 0111 12/02/19 0836 12/03/19 0044  NA 138   < > 137   < > 138 139 139  K 3.4*   < > 3.5   < > 3.8 3.1* 3.5  CL 102   < > 100   < > 97* 99 102  CO2 24   < > 24   < > 32 27 26  GLUCOSE 136*   < > 139*   < > 113* 111* 98  BUN 40*   < > 33*   < > 27* 25* 19  CREATININE 3.73*   < > 2.24*   < > 1.60* 1.33* 1.13*  CALCIUM 8.5*   < > 8.3*   < > 8.3* 8.8* 8.6*  PROT 6.3*  --   --   --   --   --   --   ALBUMIN 3.2*  --   --   --   --   --   --   AST 46*  --   --   --   --   --   --  ALT 33  --   --   --   --   --   --   ALKPHOS 64  --   --   --   --   --   --   BILITOT 0.6  --   --   --   --   --   --   GFRNONAA 10*   < > 18*   < > 27* 34* 42*  GFRAA 11*  --  21*  --  32*  --   --   ANIONGAP 12   < > 13   < > 9 13 11    < > = values in this interval not displayed.     Hematology Recent Labs  Lab 12/01/19 0123 12/02/19 0102 12/03/19 0044  WBC 9.5 8.7 7.1  RBC 3.32* 3.35* 3.38*  HGB 10.2* 10.3* 10.6*  HCT 32.0* 32.1* 32.5*  MCV 96.4 95.8 96.2  MCH 30.7 30.7 31.4  MCHC 31.9 32.1 32.6  RDW 15.9* 15.9* 15.8*  PLT 212 219 240    Radiology    No results found.   Patient Profile     86 year old female with past medical history of paroxysmal atrial fibrillation, prior CVA, hypertension, mild dementia admitted with bradycardia.  Most recent echocardiogram July 2021 showed ejection fraction 45 to 50%, mild left ventricular hypertrophy, moderate left atrial enlargement, mild right atrial enlargement, mild mitral regurgitation, moderate tricuspid regurgitation.  Assessment & Plan    1 acute combined systolic/diastolic congestive heart failure-chest x-ray shows bilateral pleural effusions.  Saturations have decreased. I/O+[id number].  We will give Lasix 40 mg IV twice daily.  Follow renal function closely, seems to be slightly negative on oral lasix.  2 bradycardia-resolved after holding  amiodarone and metoprolol.  Heart rate now in the 60s and 70s. Now in afib with RVR - see #3.  3 paroxysmal atrial fibrillation- back in afib again.  Continue apixaban. Now on low dose BB- could titrate to TID for additional rate control.  4 acute kidney disease-creatinine improving on lasix (3.73, 2.24, 1.6) - net negative 1.2L yesterday. Monitor.  5 hypertension-BP elevated this morning, hydralazine 25 mg TID just added-  can wean NTG to imdur as BP allows.  6 Fever-Antibiotics initiated per CCM; now discontinued without source of infection  7. Schizoaffective d/o - on Geodon   For questions or updates, please contact [facility] Please consult [email or web address] for contact info under   05-22-1988, MD, [name]  [place]  [facility]  Medical Director of the [facility]  [facility] Diplomate of the American Board of Clinical Lipidology Attending Cardiologist  Direct Dial: [phone]  Fax: [phone]  Website:  [email or web address]  [phone], MD  12/03/2019, 8:29 AM

## 2019-12-21 ENCOUNTER — Other Ambulatory Visit: Payer: Self-pay

## 2019-12-21 ENCOUNTER — Ambulatory Visit (INDEPENDENT_AMBULATORY_CARE_PROVIDER_SITE_OTHER): Payer: Medicare PPO | Admitting: Cardiovascular Disease

## 2019-12-21 ENCOUNTER — Encounter: Payer: Self-pay | Admitting: Cardiovascular Disease

## 2019-12-21 VITALS — BP 152/59 | HR 42 | Ht 62.0 in | Wt 105.0 lb

## 2019-12-21 DIAGNOSIS — I4891 Unspecified atrial fibrillation: Secondary | ICD-10-CM | POA: Diagnosis not present

## 2019-12-21 DIAGNOSIS — I480 Paroxysmal atrial fibrillation: Secondary | ICD-10-CM

## 2019-12-21 DIAGNOSIS — E785 Hyperlipidemia, unspecified: Secondary | ICD-10-CM

## 2019-12-21 DIAGNOSIS — I5033 Acute on chronic diastolic (congestive) heart failure: Secondary | ICD-10-CM | POA: Diagnosis not present

## 2019-12-21 DIAGNOSIS — R001 Bradycardia, unspecified: Secondary | ICD-10-CM | POA: Diagnosis not present

## 2019-12-21 NOTE — Progress Notes (Signed)
Cardiology Office Note   Date:  12/21/2019   ID:  DARIAN Baker, DOB 01/25/1924, MRN [id number]  PCP:  Yolanda Ran, MD  Cardiologist:  Yolanda Si, MD  Electrophysiologist:  None   Evaluation Performed:  Follow-Up Visit  Chief Complaint:  bradycardia  History of Present Illness:    Yolanda Baker is a 86 y.o. female with atrial fibrillation, hypertension, prior stroke, and dementia here for follow-up.  She was admitted 01/2019 with shortness of breath.  She was noted to be in new onset atrial fibrillation with rapid ventricular response.  She was mildly volume overloaded with a BNP of 335 and a pleural effusion on chest x-ray.  Creatinine was 1.8.  Symptoms improved with IV metoprolol and Lasix.  She was started on oral metoprolol and Lasix prior to discharge.  Echo at that time revealed LVEF 50 to 55% with moderate LVH and a possibility of cardiac amyloidosis.  Diastolic function was indeterminate.  Right ventricular function was normal.  She was seen by Yolanda Fabian, NP, on 01/2019.  The possibility of working her up for amyloidosis was discussed with her daughter and she like to not to pursue it.  She was admitted to the hospital 04/2019 with a right periprosthetic hip fracture.  This occurred in the setting of falling out of her bed.  Conservative treatment with physical therapy and hip bracing was elected.  She was then admitted 04/2019 with shortness of breath thought to be due to pneumonia.  She followed up with Yolanda Baker virtually on 04/2019 and was doing better.  Lately she has been feeling mostly well.  She notes that she is sometimes short of breath. She notices it most when she is active.  She sometimes notes it when laying in bed as well.  She has no orthopnea or PND.  Her weight has been stable around 100lb. She and her daughter have not noticed any edema.  She is still wearing a brace and is non-weight bearing.  She hopes to be able to walk soon but is waiting on a follow-up x-ray.   She has her blood pressure checked sporadically at her assisted living facility.  Her blood pressure tends to run in the 130s to 150s over 60s to 80s in the morning before taking her medication.  Later in the day it is in the 120s over 60s.  Her heart rate is pretty consistently in the 60s to 70s.  She denies palpitations.   Yolanda Baker reported exertional dyspnea.  She did not appear to be volume overloaded on limited exam.  We recommended that she have a BNP checked.  This did not occur.  She was admitted 08/2019 with increasing shortness of breath.  She was hypoxic and tachycardic with atrial fibrillation with RVR.  CTA was negative for PE.  She did have pleural effusions and atelectasis.  She was treated with Lasix and rate control.  Echo revealed LVEF 45 to 50% with global hypokinesis.  Family requested cardiology consultation.  She was started on oral amiodarone.  Metoprolol was increased to 75mg .  She was again admitted 09/2019 with shortness of breath.  She was hypoxic to 88% on room air.  She was in atrial fibrillation that was rate-controlled.  She was again admitted 09/2019 with acute on chronic heart failure.  She was doing fine and then acutely became short of breath with physical therapy and Occupational Therapy.  She was hypoxic and BNP was 415.  She had bilateral pleural effusions that  were persistent from prior.  She required diuresis with IV Lasix.  At discharge Lasix was increased from 20 mg every other day to daily.  D-dimer was mildly elevated and CT-a was negative for PE.    Since her last appointment she was seen in the hospital 11/2019 with generalized weakness and bradycardia.  Amiodarone was held and metoprolol was reduced. Since then her PCP stopped amlodipine due to concern for bradycardia.  Since then her heart rate has remained low.  She reports feeling generally fatigued.  She broke her femur and was not a surgical candidate.  She had to stop participating with physical therapy because  her heart rate got too high when she exercised.  She is now essentially bedbound or in her wheelchair.  Her daughter is unsure what her blood pressure runs, though she does think it runs higher in the morning and seems to improve throughout the day.  She has minimal edema and no orthopnea or PND.  Past Medical History:  Diagnosis Date  . Anemia   . Anxiety   . Arthritis    neck  . Bruises easily   . Chronic diastolic CHF (congestive heart failure) (HCC)   . COPD (chronic obstructive pulmonary disease) (HCC)   . Dementia (HCC)   . Depression   . Dysrhythmia    hx A-FLUTTER after hip surg April 2015- take metoprolol  . Eczema   . Falls   . Hip fracture (HCC)   . Hip joint pain    RT  . Hypertension   . Osteoporosis   . Persistent atrial fibrillation (HCC) 08/04/2019  . Pneumonia    august 2017  . Stroke [facility])    Past Surgical History:  Procedure Laterality Date  . ABDOMINAL HYSTERECTOMY    . FRACTURE SURGERY Right 06/05/2013   hip  . HIP ARTHROPLASTY Right 10/03/2013   Procedure: CONVERSION OF PREVIOUS RIGHT HIP SURGERY TO A RIGHT TOTAL HIP ARTHROPLASTY;  Surgeon: Loanne Drilling, MD;  Location: [facility];  Service: Orthopedics;  Laterality: Right;  . INTRAMEDULLARY (IM) NAIL INTERTROCHANTERIC Right 06/05/2013   Procedure: INTRAMEDULLARY (IM) NAIL INTERTROCHANTRIC;  Surgeon: Loanne Drilling, MD;  Location: [facility];  Service: Orthopedics;  Laterality: Right;  . JOINT REPLACEMENT     left hip  . left hip Left 2008  . LEG SURGERY Right    x2     Current Meds  Medication Sig  . apixaban (ELIQUIS) 2.5 MG TABS tablet Take 1 tablet (2.5 mg total) by mouth 2 (two) times daily.  . chlorhexidine (PERIDEX) 0.12 % solution Use as directed 15 mLs in the mouth or throat 2 (two) times daily. Swish and spit  . citalopram (CELEXA) 20 MG tablet Take 1 tablet (20 mg total) by mouth daily.  . furosemide (LASIX) 40 MG tablet Take 1 tablet (40 mg total) by mouth daily.  Marland Kitchen galantamine (RAZADYNE ER)  8 MG 24 hr capsule Take 8 mg by mouth daily with breakfast.  . hydrALAZINE (APRESOLINE) 25 MG tablet Take 1 tablet (25 mg total) by mouth every 8 (eight) hours.  Marland Kitchen ipratropium-albuterol (DUONEB) 0.5-2.5 (3) MG/3ML SOLN Take 3 mLs by nebulization every 6 (six) hours as needed (shortness of breath).  . loperamide (IMODIUM) 2 MG capsule Take 2 mg by mouth 3 (three) times daily as needed for diarrhea or loose stools.   . Multiple Vitamins-Minerals (CERTAVITE SENIOR/ANTIOXIDANT PO) Take 1 tablet by mouth daily.   Bertram Gala Glycol-Propyl Glycol (SYSTANE) 0.4-0.3 % SOLN Place 1 drop  into both eyes every hour as needed (dry eyes/irritation).  . potassium chloride SA (K-DUR,KLOR-CON) 20 MEQ tablet Take 20 mEq by mouth every other day.  . psyllium (REGULOID) 0.52 g capsule Take 0.52 g by mouth daily.  . Tiotropium Bromide-Olodaterol (STIOLTO RESPIMAT) 2.5-2.5 MCG/ACT AERS Inhale 2 puffs into the lungs daily.  . ziprasidone (GEODON) 20 MG capsule TAKE ONE CAPSULE BY MOUTH DAILY IN THE AFTERNOON **DO NOT CRUSH** **NO REFILLS** (Patient taking differently: Take 20 mg by mouth daily at 2 PM. )  . [DISCONTINUED] acetaminophen (TYLENOL) 500 MG tablet Take 1 tablet (500 mg total) by mouth 2 (two) times daily as needed for mild pain or moderate pain.  . [DISCONTINUED] amLODipine (NORVASC) 2.5 MG tablet Take 1 tablet (2.5 mg total) by mouth daily.  . [DISCONTINUED] metoprolol tartrate (LOPRESSOR) 25 MG tablet Take 0.5 tablets (12.5 mg total) by mouth 2 (two) times daily.     Allergies:   Flagyl [metronidazole]   Social History   Tobacco Use  . Smoking status: Never Smoker  . Smokeless tobacco: Never Used  Vaping Use  . Vaping Use: Never used  Substance Use Topics  . Alcohol use: No    Alcohol/week: 0.0 standard drinks  . Drug use: No     Family Hx: The patient's family history includes Heart attack in her father; Hypertension in her brother, father, and sister; Stroke in her sister.  ROS:   Please  see the history of present illness.     All other systems reviewed and are negative.   Prior CV studies:   The following studies were reviewed today:  Echo 01/2019: 1. Left ventricular ejection fraction, by visual estimation, is 50 to  55%. The left ventricle has low normal function. There is moderately  increased left ventricular hypertrophy. The LV morphology could be  consistent with cardiac amyloidosis.  2. Left ventricular diastolic parameters are indeterminate.  3. The left ventricle has no regional wall motion abnormalities.  4. Global right ventricle has normal systolic function.The right  ventricular size is normal. No increase in right ventricular wall  thickness.  5. Left atrial size was mildly dilated.  6. Right atrial size was mildly dilated.  7. Trivial pericardial effusion is present.  8. The mitral valve is normal in structure. Mild mitral valve  regurgitation. No evidence of mitral stenosis.  9. The tricuspid valve is normal in structure. Tricuspid valve  regurgitation is trivial.  10. The aortic valve is tricuspid. Aortic valve regurgitation is not  visualized. No evidence of aortic valve sclerosis or stenosis.  11. The tricuspid regurgitant velocity is 2.48 m/s, and with an assumed  right atrial pressure of 8 mmHg, the estimated right ventricular systolic  pressure is mildly elevated at 32.6 mmHg.  12. The inferior vena cava is normal in size with <50% respiratory  variability, suggesting right atrial pressure of 8 mmHg.   Echo 08/2019: 1. Left ventricular ejection fraction, by estimation, is 45 to 50%. The  left ventricle has mildly decreased function. The left ventricle  demonstrates global hypokinesis. There is mild left ventricular  hypertrophy. Left ventricular diastolic parameters  are indeterminate.  2. Right ventricular systolic function is normal. The right ventricular  size is normal. There is normal pulmonary artery systolic pressure. The   estimated right ventricular systolic pressure is 27.6 mmHg.  3. Left atrial size was moderately dilated.  4. Right atrial size was mildly dilated.  5. The mitral valve is normal in structure. Mild mitral valve  regurgitation. No evidence of mitral stenosis.  6. Tricuspid valve regurgitation is moderate.  7. The aortic valve is tricuspid. Aortic valve regurgitation is not  visualized. Mild aortic valve sclerosis is present, with no evidence of  aortic valve stenosis.  8. The inferior vena cava is normal in size with greater than 50%  respiratory variability, suggesting right atrial pressure of 3 mmHg.   Labs/Other Tests and Data Reviewed:    EKG:  No ECG reviewed.   Recent Labs: 10/16/2019: B Natriuretic Peptide 415.6 11/27/2019: Magnesium 1.9; TSH 14.929 11/28/2019: ALT 33 12/03/2019: BUN 19; Creatinine, Ser 1.13; Hemoglobin 10.6; Platelets 240; Potassium 3.5; Sodium 139   Recent Lipid Panel Lab Results  Component Value Date/Time   CHOL 148 06/30/2014 02:46 AM   TRIG 43 06/30/2014 02:46 AM   HDL 59 06/30/2014 02:46 AM   CHOLHDL 2.5 06/30/2014 02:46 AM   LDLCALC 80 06/30/2014 02:46 AM    Wt Readings from Last 3 Encounters:  12/21/19 105 lb (47.6 kg)  11/30/19 105 lb 9.6 oz (47.9 kg)  10/21/19 101 lb 6.4 oz (46 kg)     Objective:    VS:  BP (!) 152/59   Pulse (!) 42   Ht 5\' 2"  (1.575 m)   Wt 105 lb (47.6 kg)   SpO2 98%   BMI 19.20 kg/m  , BMI Body mass index is 19.2 kg/m. GENERAL:  Well appearing HEENT: Pupils equal round and reactive, fundi not visualized, oral mucosa unremarkable NECK:  No jugular venous distention, waveform within normal limits, carotid upstroke brisk and symmetric, no bruits, no thyromegaly LYMPHATICS:  No cervical adenopathy LUNGS:  Clear to auscultation bilaterally HEART:  RRR.  PMI not displaced or sustained,S1 and S2 within normal limits, no S3, no S4, no clicks, no rubs, no murmurs ABD:  Flat, positive bowel sounds normal in frequency  in pitch, no bruits, no rebound, no guarding, no midline pulsatile mass, no hepatomegaly, no splenomegaly EXT:  2 plus pulses throughout, no edema, no cyanosis no clubbing SKIN:  No rashes no nodules NEURO:  Cranial nerves II through XII grossly intact, motor grossly intact throughout PSYCH:  Cognitively intact, oriented to person place and time   ASSESSMENT & PLAN:    # Persistent atrial fibrillation: Ms. Sons remains in atrial fibrillation.  She has sick sinus syndrome.  Her heart rates today are in the 40s despite stopping amiodarone and reducing metoprolol.  Her PCP also stopped amlodipine, which I doubt is significantly contributing.  We will stop the metoprolol.  I will have her wear a 3-day ZIO to get a better sense of her true heart ranges.  This is a difficult situation as she is 69 and relatively immobilized.  I do think that her inability to participate with physical therapy is related to her tachycardia with movement.  She reports that she would be interested in considering a pacemaker.  Her daughter is hesitant.  We will have her follow-up with the A. fib clinic to further evaluate once the monitor is back. Continue Eliquis.  # Chronic systolic and diastolic heart failure: # Hypertension:  Ms. Gavitt has been admitted multiple times with heart failure exacerbations.  Volume and weight are stable today.  Continue hydralazine.  We have asked her facility to track her blood pressure and heart rate twice daily and fax Einar Gip the results.  # Hyperlipidemia:  Continue simvastatin.     Medication Adjustments/Labs and Tests Ordered: Current medicines are reviewed at length with the patient today.  Concerns regarding medicines are outlined above.   Tests Ordered: Orders Placed This Encounter  Procedures  . Amb Referral to AFIB Clinic  . LONG TERM MONITOR (3-14 DAYS)    Medication Changes: No orders of the defined types were placed in this encounter.   Follow Up:  In Person in 3  month(s).  Afib clinic in 1 month  Signed, Yolanda Si, MD  12/21/2019 2:35 PM    [facility]

## 2019-12-21 NOTE — Patient Instructions (Addendum)
Medication Instructions:  STOP METOPROLOL   *If you need a refill on your cardiac medications before your next appointment, please call your pharmacy*  Lab Work: NONE   Testing/Procedures: 3 DAY ZIO TO BE PUT ON AT [address]  [facility] AT [address]   Follow-Up: At [facility] you and your health needs are our priority.  As part of our continuing mission to provide you with exceptional heart care, we have created designated Provider Care Teams.  These Care Teams include your primary Cardiologist (physician) and Advanced Practice Providers (APPs -  Physician Assistants and Nurse Practitioners) who all work together to provide you with the care you need, when you need it.  We recommend signing up for the patient portal called "MyChart".  Sign up information is provided on this After Visit Summary.  MyChart is used to connect with patients for Virtual Visits (Telemedicine).  Patients are able to view lab/test results, encounter notes, upcoming appointments, etc.  Non-urgent messages can be sent to your provider as well.   To learn more about what you can do with MyChart, go to [email or web address].    Your next appointment:   1 MONTH IN AFIB CLINIC  [place] IN 3 MONTHS   MONITOR BLOOD PRESSURE AND HEART RATE DAILY FOR 2 WEEKS  FAX RESULTS TO [phone]

## 2019-12-23 ENCOUNTER — Other Ambulatory Visit: Payer: Self-pay

## 2019-12-23 ENCOUNTER — Ambulatory Visit (INDEPENDENT_AMBULATORY_CARE_PROVIDER_SITE_OTHER): Payer: Medicare PPO

## 2019-12-23 DIAGNOSIS — I480 Paroxysmal atrial fibrillation: Secondary | ICD-10-CM | POA: Diagnosis not present

## 2019-12-23 DIAGNOSIS — R001 Bradycardia, unspecified: Secondary | ICD-10-CM

## 2020-01-07 ENCOUNTER — Telehealth: Payer: Self-pay | Admitting: Cardiovascular Disease

## 2020-01-07 NOTE — Telephone Encounter (Signed)
Nicolette with [employer] is calling to report abnormal monitor results.  Phone Number: [phone] (reference #: [id number])

## 2020-01-07 NOTE — Telephone Encounter (Signed)
OK.  Let's get her in clinic to discuss as soon as possible.

## 2020-01-07 NOTE — Telephone Encounter (Signed)
Spoke to representative from [employer].  Patient has completed 3 day Zio  monitor - downloading information- representative states the patient had a V. Tach  At the rate of 168 beats  For 30 sec on Oct 29 @5 :57 pm  . The information are on page 16 and 17. Patienthad 17 % burden of afib that was documented.   route and defer  to Dr and Duke Salvia NP  . Patient has an appointment 11/30/21at Afib clinic

## 2020-01-08 NOTE — Telephone Encounter (Signed)
Dr Duke Salvia is working in the hospital next week. She wanted to see if patient could be seen sooner in AFib clinic Patients appointment has been moved to Monday, daughter aware

## 2020-01-11 ENCOUNTER — Ambulatory Visit (HOSPITAL_COMMUNITY)
Admission: RE | Admit: 2020-01-11 | Discharge: 2020-01-11 | Disposition: A | Discharge status: Home / Self Care | Payer: Medicare PPO | Source: Ambulatory Visit | Attending: Nurse Practitioner | Admitting: Nurse Practitioner

## 2020-01-11 ENCOUNTER — Encounter (HOSPITAL_COMMUNITY): Payer: Self-pay | Admitting: Nurse Practitioner

## 2020-01-11 ENCOUNTER — Other Ambulatory Visit: Payer: Self-pay

## 2020-01-11 VITALS — BP 144/56 | HR 65 | Ht 62.0 in | Wt 105.0 lb

## 2020-01-11 DIAGNOSIS — F0390 Unspecified dementia without behavioral disturbance: Secondary | ICD-10-CM | POA: Diagnosis not present

## 2020-01-11 DIAGNOSIS — R001 Bradycardia, unspecified: Secondary | ICD-10-CM | POA: Diagnosis not present

## 2020-01-11 DIAGNOSIS — I495 Sick sinus syndrome: Secondary | ICD-10-CM | POA: Diagnosis not present

## 2020-01-11 DIAGNOSIS — I480 Paroxysmal atrial fibrillation: Secondary | ICD-10-CM | POA: Diagnosis not present

## 2020-01-11 DIAGNOSIS — Z7901 Long term (current) use of anticoagulants: Secondary | ICD-10-CM | POA: Diagnosis not present

## 2020-01-11 DIAGNOSIS — Z79899 Other long term (current) drug therapy: Secondary | ICD-10-CM | POA: Diagnosis not present

## 2020-01-11 DIAGNOSIS — I5023 Acute on chronic systolic (congestive) heart failure: Secondary | ICD-10-CM | POA: Insufficient documentation

## 2020-01-11 DIAGNOSIS — Z8673 Personal history of transient ischemic attack (TIA), and cerebral infarction without residual deficits: Secondary | ICD-10-CM | POA: Diagnosis not present

## 2020-01-11 DIAGNOSIS — D6869 Other thrombophilia: Secondary | ICD-10-CM | POA: Diagnosis not present

## 2020-01-11 DIAGNOSIS — I110 Hypertensive heart disease with heart failure: Secondary | ICD-10-CM | POA: Insufficient documentation

## 2020-01-11 NOTE — Progress Notes (Signed)
Primary Care Physician: Rodrigo Ran, MD Referring Physician:Dr. Marithza Malachi Quaintance is a 86 y.o. female with a h/o atrial fibrillation, hypertension, prior stroke, and dementia here for evaluation  for tachy/brady per Dr. Duke Salvia..  She was  initially  admitted 01/2019 with shortness of breath.  She was noted to be in new onset atrial fibrillation with rapid ventricular response. Since then she has had many admissions for afib with RVR and heart failure. She most recently was taken off amiodarone and BB 2/2 to admission 10//7 for weakness and fatigue and found to bradycardic, requiring temporary external pacing. She then went back into afib with RVR and low dose metoprolol was restarted. When she saw Dr. Duke Salvia 10/25, she had ongoing bradycardia with HR's in the 40's  and weakness. BB was stopped. A zio patch was placed and showed 17% afib burden, average in afib at 105 bpm, In SR, heart rate in the 40's at times with an average of 60 bpm., and a high of 187 bpm.   In the clinic today, pt is here with her daughter and son. Ekg shows sinus rhythm with Wenckebach or non conducted PAC's at 65 bpm, qrs int 70 ms, qtc 468 ms. Review of her EPIC notes, show that pt is having  tachy/brady. She develops profound bradycardia weakness with rate control meds /amidarone on board but most of her hospitalizations, are driven by afib with RVR and heart failure.   Discussed role of an PPM with pt and family with today's visit.  I explained that I do not implant PPM's and they would need to f/u with EP MD to further discuss. The pt wants to hear more about a PPM, the daughter outside the room, told me that she  is leaning toward conservative treatment/ palliative care.   Today, she denies symptoms of palpitations, chest pain, shortness of breath, orthopnea, PND, lower extremity edema, dizziness, presyncope, syncope, or neurologic sequela. The patient is tolerating medications without difficulties and is  otherwise without complaint today.   Past Medical History:  Diagnosis Date  . Anemia   . Anxiety   . Arthritis    neck  . Bruises easily   . Chronic diastolic CHF (congestive heart failure) (HCC)   . COPD (chronic obstructive pulmonary disease) (HCC)   . Dementia (HCC)   . Depression   . Dysrhythmia    hx A-FLUTTER after hip surg April 2015- take metoprolol  . Eczema   . Falls   . Hip fracture (HCC)   . Hip joint pain    RT  . Hypertension   . Osteoporosis   . Persistent atrial fibrillation (HCC) 08/04/2019  . Pneumonia    august 2017  . Stroke [facility])    Past Surgical History:  Procedure Laterality Date  . ABDOMINAL HYSTERECTOMY    . FRACTURE SURGERY Right 06/05/2013   hip  . HIP ARTHROPLASTY Right 10/03/2013   Procedure: CONVERSION OF PREVIOUS RIGHT HIP SURGERY TO A RIGHT TOTAL HIP ARTHROPLASTY;  Surgeon: Loanne Drilling, MD;  Location: [facility];  Service: Orthopedics;  Laterality: Right;  . INTRAMEDULLARY (IM) NAIL INTERTROCHANTERIC Right 06/05/2013   Procedure: INTRAMEDULLARY (IM) NAIL INTERTROCHANTRIC;  Surgeon: Loanne Drilling, MD;  Location: [facility];  Service: Orthopedics;  Laterality: Right;  . JOINT REPLACEMENT     left hip  . left hip Left 2008  . LEG SURGERY Right    x2    Current Outpatient Medications  Medication Sig Dispense Refill  .  acetaminophen (TYLENOL) 500 MG tablet Take 500 mg by mouth 2 (two) times daily as needed.    Marland Kitchen apixaban (ELIQUIS) 2.5 MG TABS tablet Take 1 tablet (2.5 mg total) by mouth 2 (two) times daily. 60 tablet 2  . chlorhexidine (PERIDEX) 0.12 % solution Use as directed 15 mLs in the mouth or throat 2 (two) times daily. Swish and spit    . citalopram (CELEXA) 20 MG tablet Take 1 tablet (20 mg total) by mouth daily. 30 tablet 0  . furosemide (LASIX) 40 MG tablet Take 1 tablet (40 mg total) by mouth daily. 30 tablet 0  . galantamine (RAZADYNE ER) 8 MG 24 hr capsule Take 8 mg by mouth daily with breakfast.    . hydrALAZINE (APRESOLINE) 25  MG tablet Take 1 tablet (25 mg total) by mouth every 8 (eight) hours. 30 tablet 0  . ipratropium-albuterol (DUONEB) 0.5-2.5 (3) MG/3ML SOLN Take 3 mLs by nebulization every 6 (six) hours as needed (shortness of breath).    . loperamide (IMODIUM) 2 MG capsule Take 2 mg by mouth 3 (three) times daily as needed for diarrhea or loose stools.     . Multiple Vitamins-Minerals (CERTAVITE SENIOR/ANTIOXIDANT PO) Take 1 tablet by mouth daily.     Bertram Gala Glycol-Propyl Glycol (SYSTANE) 0.4-0.3 % SOLN Place 1 drop into both eyes every hour as needed (dry eyes/irritation).    . potassium chloride SA (K-DUR,KLOR-CON) 20 MEQ tablet Take 20 mEq by mouth every other day.    . psyllium (REGULOID) 0.52 g capsule Take 0.52 g by mouth daily.    . Tiotropium Bromide-Olodaterol (STIOLTO RESPIMAT) 2.5-2.5 MCG/ACT AERS Inhale 2 puffs into the lungs daily.    . ziprasidone (GEODON) 20 MG capsule TAKE ONE CAPSULE BY MOUTH DAILY IN THE AFTERNOON **DO NOT CRUSH** **NO REFILLS** (Patient taking differently: Take 20 mg by mouth daily at 2 PM. ) 30 capsule 2   No current facility-administered medications for this encounter.    Allergies  Allergen Reactions  . Flagyl [Metronidazole] Other (See Comments)    Fingers get "numb"    Social History   Socioeconomic History  . Marital status: Widowed    Spouse name: Not on file  . Number of children: 3  . Years of education: 61yr colleg  . Highest education level: Not on file  Occupational History  . Occupation: Retired   Tobacco Use  . Smoking status: Never Smoker  . Smokeless tobacco: Never Used  Vaping Use  . Vaping Use: Never used  Substance and Sexual Activity  . Alcohol use: No    Alcohol/week: 0.0 standard drinks  . Drug use: No  . Sexual activity: Never  Other Topics Concern  . Not on file  Social History Narrative   Drinks about 3 cups of coffee a day    Social Determinants of Health   Financial Resource Strain:   . Difficulty of Paying Living  Expenses: Not on file  Food Insecurity:   . Worried About Programme researcher, broadcasting/film/video in the Last Year: Not on file  . Ran Out of Food in the Last Year: Not on file  Transportation Needs:   . Lack of Transportation (Medical): Not on file  . Lack of Transportation (Non-Medical): Not on file  Physical Activity:   . Days of Exercise per Week: Not on file  . Minutes of Exercise per Session: Not on file  Stress:   . Feeling of Stress : Not on file  Social Connections:   .  Frequency of Communication with Friends and Family: Not on file  . Frequency of Social Gatherings with Friends and Family: Not on file  . Attends Religious Services: Not on file  . Active Member of Clubs or Organizations: Not on file  . Attends Banker Meetings: Not on file  . Marital Status: Not on file  Intimate Partner Violence:   . Fear of Current or Ex-Partner: Not on file  . Emotionally Abused: Not on file  . Physically Abused: Not on file  . Sexually Abused: Not on file    Family History  Problem Relation Age of Onset  . Heart attack Father   . Hypertension Father   . Hypertension Sister   . Stroke Sister   . Hypertension Brother     ROS- All systems are reviewed and negative except as per the HPI above  Physical Exam: Vitals:   01/11/20 0902  Weight: 47.6 kg  Height: 5\' 2"  (1.575 m)   Wt Readings from Last 3 Encounters:  01/11/20 47.6 kg  12/21/19 47.6 kg  11/30/19 47.9 kg    Labs: Lab Results  Component Value Date   NA 139 12/03/2019   K 3.5 12/03/2019   CL 102 12/03/2019   CO2 26 12/03/2019   GLUCOSE 98 12/03/2019   BUN 19 12/03/2019   CREATININE 1.13 (H) 12/03/2019   CALCIUM 8.6 (L) 12/03/2019   PHOS 3.6 10/16/2019   MG 1.9 11/27/2019   Lab Results  Component Value Date   INR 1.20 06/29/2014   Lab Results  Component Value Date   CHOL 148 06/30/2014   HDL 59 06/30/2014   LDLCALC 80 06/30/2014   TRIG 43 06/30/2014     GEN- The patient is well appearing, alert and  oriented x 3 today.   Head- normocephalic, atraumatic Eyes-  Sclera clear, conjunctiva pink Ears- hearing intact Oropharynx- clear Neck- supple, no JVP Lymph- no cervical lymphadenopathy Lungs- Clear to ausculation bilaterally, normal work of breathing Heart- irregular rate and rhythm, no murmurs, rubs or gallops, PMI not laterally displaced GI- soft, NT, ND, + BS Extremities- no clubbing, cyanosis, or edema MS- no significant deformity or atrophy Skin- no rash or lesion Psych- euthymic mood, full affect Neuro- strength and sensation are intact  EKG-sinus rhythm with Wenckebach  vrs  non condcuted PAC's at 64 bpm.   Epic records reviewed  Assessment and Plan: 1. Paroxysmal afib with tachy/brady syndrome  Appears  to be in SR today Has not tolerated rate control or amiodarone for worsening of weakness bradycardia When in afib she has RVR and often contributes to hospitalization with HF We discussed a pacemaker and risk vrs benefit  The pt wants to hear more about a PPM, will refer her to Dr. 08/30/2014 to discuss  The daughter wants a conservative/pallitive care type  approach   2. Acute on  Chronic systolic HF Appears normovolemic today  Continue  diuretic as prescribed   3. CHA2DS2VASc score of at least 7 Continue eliquis 5 mg bid   Ladona Ridgel. [facility] [address] [place], [place] [phone]

## 2020-01-19 ENCOUNTER — Other Ambulatory Visit: Payer: Self-pay

## 2020-01-19 ENCOUNTER — Encounter: Payer: Self-pay | Admitting: Internal Medicine

## 2020-01-19 ENCOUNTER — Ambulatory Visit: Payer: Medicare PPO | Admitting: Internal Medicine

## 2020-01-19 VITALS — BP 154/86 | HR 98 | Ht 62.0 in | Wt 105.0 lb

## 2020-01-19 DIAGNOSIS — I480 Paroxysmal atrial fibrillation: Secondary | ICD-10-CM

## 2020-01-19 DIAGNOSIS — R001 Bradycardia, unspecified: Secondary | ICD-10-CM

## 2020-01-19 MED ORDER — METOPROLOL TARTRATE 25 MG PO TABS: ORAL_TABLET | ORAL | 3 refills | Status: DC

## 2020-01-19 NOTE — Progress Notes (Signed)
HPI Yolanda Baker is referred by Rudi Coco for evaluation of tachy-brady syndrome. She is a pleasant 86 yo woman with atrial fib and a RVR, and sinus node dysfunction on amiodarone. She was in the hospital last month with bradycardia which resolved with stopping the amiodarone. She has not had syncope recently she is quite frail and denies palpitations. She has not had anginal symptoms. There has been discussion about referral to palliative care.  Allergies  Allergen Reactions  . Flagyl [Metronidazole] Other (See Comments)    Fingers get "numb"     Current Outpatient Medications  Medication Sig Dispense Refill  . acetaminophen (TYLENOL) 500 MG tablet Take 500 mg by mouth 2 (two) times daily as needed.    Marland Kitchen apixaban (ELIQUIS) 2.5 MG TABS tablet Take 1 tablet (2.5 mg total) by mouth 2 (two) times daily. 60 tablet 2  . chlorhexidine (PERIDEX) 0.12 % solution Use as directed 15 mLs in the mouth or throat 2 (two) times daily. Swish and spit    . citalopram (CELEXA) 20 MG tablet Take 1 tablet (20 mg total) by mouth daily. 30 tablet 0  . furosemide (LASIX) 40 MG tablet Take 1 tablet (40 mg total) by mouth daily. 30 tablet 0  . galantamine (RAZADYNE ER) 8 MG 24 hr capsule Take 8 mg by mouth daily with breakfast.    . hydrALAZINE (APRESOLINE) 25 MG tablet Take 1 tablet (25 mg total) by mouth every 8 (eight) hours. 30 tablet 0  . ipratropium-albuterol (DUONEB) 0.5-2.5 (3) MG/3ML SOLN Take 3 mLs by nebulization every 6 (six) hours as needed (shortness of breath).    . loperamide (IMODIUM) 2 MG capsule Take 2 mg by mouth 3 (three) times daily as needed for diarrhea or loose stools.     . Multiple Vitamins-Minerals (CERTAVITE SENIOR/ANTIOXIDANT PO) Take 1 tablet by mouth daily.     Bertram Gala Glycol-Propyl Glycol (SYSTANE) 0.4-0.3 % SOLN Place 1 drop into both eyes every hour as needed (dry eyes/irritation).    . potassium chloride SA (K-DUR,KLOR-CON) 20 MEQ tablet Take 20 mEq by mouth every other  day.    . psyllium (REGULOID) 0.52 g capsule Take 0.52 g by mouth daily.    . Tiotropium Bromide-Olodaterol (STIOLTO RESPIMAT) 2.5-2.5 MCG/ACT AERS Inhale 2 puffs into the lungs daily.    . ziprasidone (GEODON) 20 MG capsule TAKE ONE CAPSULE BY MOUTH DAILY IN THE AFTERNOON **DO NOT CRUSH** **NO REFILLS** (Patient taking differently: Take 20 mg by mouth daily at 2 PM. ) 30 capsule 2   No current facility-administered medications for this visit.     Past Medical History:  Diagnosis Date  . Anemia   . Anxiety   . Arthritis    neck  . Bruises easily   . Chronic diastolic CHF (congestive heart failure) (HCC)   . COPD (chronic obstructive pulmonary disease) (HCC)   . Dementia (HCC)   . Depression   . Dysrhythmia    hx A-FLUTTER after hip surg April 2015- take metoprolol  . Eczema   . Falls   . Hip fracture (HCC)   . Hip joint pain    RT  . Hypertension   . Osteoporosis   . Persistent atrial fibrillation (HCC) 08/04/2019  . Pneumonia    august 2017  . Stroke (HCC)     ROS:   All systems reviewed and negative except as noted in the HPI.   Past Surgical History:  Procedure Laterality Date  . ABDOMINAL HYSTERECTOMY    .  FRACTURE SURGERY Right 06/05/2013   hip  . HIP ARTHROPLASTY Right 10/03/2013   Procedure: CONVERSION OF PREVIOUS RIGHT HIP SURGERY TO A RIGHT TOTAL HIP ARTHROPLASTY;  Surgeon: Loanne Drilling, MD;  Location: [facility];  Service: Orthopedics;  Laterality: Right;  . INTRAMEDULLARY (IM) NAIL INTERTROCHANTERIC Right 06/05/2013   Procedure: INTRAMEDULLARY (IM) NAIL INTERTROCHANTRIC;  Surgeon: Loanne Drilling, MD;  Location: [facility];  Service: Orthopedics;  Laterality: Right;  . JOINT REPLACEMENT     left hip  . left hip Left 2008  . LEG SURGERY Right    x2     Family History  Problem Relation Age of Onset  . Heart attack Father   . Hypertension Father   . Hypertension Sister   . Stroke Sister   . Hypertension Brother      Social History   Socioeconomic  History  . Marital status: Widowed    Spouse name: Not on file  . Number of children: 3  . Years of education: 73yr colleg  . Highest education level: Not on file  Occupational History  . Occupation: Retired   Tobacco Use  . Smoking status: Never Smoker  . Smokeless tobacco: Never Used  Vaping Use  . Vaping Use: Never used  Substance and Sexual Activity  . Alcohol use: No    Alcohol/week: 0.0 standard drinks  . Drug use: No  . Sexual activity: Never  Other Topics Concern  . Not on file  Social History Narrative   Drinks about 3 cups of coffee a day    Social Determinants of Health   Financial Resource Strain:   . Difficulty of Paying Living Expenses: Not on file  Food Insecurity:   . Worried About Programme researcher, broadcasting/film/video in the Last Year: Not on file  . Ran Out of Food in the Last Year: Not on file  Transportation Needs:   . Lack of Transportation (Medical): Not on file  . Lack of Transportation (Non-Medical): Not on file  Physical Activity:   . Days of Exercise per Week: Not on file  . Minutes of Exercise per Session: Not on file  Stress:   . Feeling of Stress : Not on file  Social Connections:   . Frequency of Communication with Friends and Family: Not on file  . Frequency of Social Gatherings with Friends and Family: Not on file  . Attends Religious Services: Not on file  . Active Member of Clubs or Organizations: Not on file  . Attends Banker Meetings: Not on file  . Marital Status: Not on file  Intimate Partner Violence:   . Fear of Current or Ex-Partner: Not on file  . Emotionally Abused: Not on file  . Physically Abused: Not on file  . Sexually Abused: Not on file     BP (!) 154/86   Pulse 98   Ht 5\' 2"  (1.575 m)   Wt 105 lb (47.6 kg)   SpO2 96%   BMI 19.20 kg/m   Physical Exam:  Well appearing NAD HEENT: Unremarkable Neck:  No JVD, no thyromegally Lymphatics:  No adenopathy Back:  No CVA tenderness Lungs:  Clear HEART:  Regular  rate rhythm, no murmurs, no rubs, no clicks Abd:  soft, positive bowel sounds, no organomegally, no rebound, no guarding Ext:  2 plus pulses, no edema, no cyanosis, no clubbing Skin:  No rashes no nodules Neuro:  CN II through XII intact, motor grossly intact  Assess/Plan: 1. Tachy - brady syndrome -  she is better. She is currently well controlled but has both sinus node dysfunction and atrial fib with a rvr. She is not a good candidate for PPM insertion. I explained to the patient and her children that the risk/benefit of PPM was not favorable for the implant of a PPM. She will undergo watchful waiting. 2. Coags - she has not had any recent falls and I would continue eliquis at low dose unless/until she does.  Sharlot Gowda Rayyan Burley,MD

## 2020-01-19 NOTE — Patient Instructions (Addendum)
Medication Instructions:  Your physician has recommended you make the following change in your medication:   1.  Metoprolol tartrate 25 mg- take one tablet as needed for shortness of breath and heart rate greater than 110 beats per minute.  May take 2 tablets in a 24 hour period.    Labwork: None ordered.  Testing/Procedures: None ordered.  Follow-Up: Your physician wants you to follow-up in: as needed with Dr. Ladona Ridgel.       Any Other Special Instructions Will Be Listed Below (If Applicable).  If you need a refill on your cardiac medications before your next appointment, please call your pharmacy.

## 2020-01-26 ENCOUNTER — Ambulatory Visit (HOSPITAL_COMMUNITY): Payer: Medicare PPO | Admitting: Nurse Practitioner

## 2020-03-17 ENCOUNTER — Other Ambulatory Visit: Payer: Self-pay

## 2020-03-17 ENCOUNTER — Non-Acute Institutional Stay: Payer: Medicare PPO | Admitting: Nurse Practitioner

## 2020-03-17 DIAGNOSIS — Z515 Encounter for palliative care: Secondary | ICD-10-CM

## 2020-03-17 DIAGNOSIS — R531 Weakness: Secondary | ICD-10-CM

## 2020-03-17 NOTE — Progress Notes (Signed)
[facility] [facility] Palliative Care Consult Note Telephone: [phone]  Fax: [phone]  PATIENT NAME: Yolanda Baker [address] [place] [address] [phone] (home)  DOB: [birth year] MRN: [id number]  PRIMARY CARE PROVIDER:    Crist Infante, MD,  [place] [address] [phone]  REFERRING PROVIDER:   Crist Infante, MD [address] [place],  [place] [address] [phone]  RESPONSIBLE PARTY:   Extended Emergency Contact Information Primary Emergency Contact: Yolanda Baker Address: [address]          [place], [place] [address] Johnnette Litter of [place] Phone: [phone] Mobile Phone: [phone] Relation: Daughter Secondary Emergency Contact: Yolanda Baker Address: [place], [place] [address] Johnnette Litter of [place] Phone: [phone] Mobile Phone: [phone] Relation: Son  I met face to face with patient and family in facility. Patient's daughter Yolanda Baker and caregiver Yolanda Baker present during visit.   ASSESSMENT AND RECOMMENDATIONS:   Advance Care Planning: Goal of care: Per daughter, goal of care is function and to keep patient out of the hospital. Saying patient was hospitalized 3 times last year.  Directives: Patient and family reiterated desire to not be resuscitated in the event of cardiac or respiratory arrest. Signed DNR for on file in the facility. The need to complete a MOST form was discussed, patient and daughter expressed interest in completing the MOST form today. Discussed and reviewed sections of the form in detail, opportunity for questions provided, all questions answered. Form completed and signed with daughter, signed form given to facility receptionist as other administrative staff are in a meeting. Copy of MOST and DNR forms uploaded to [facility] EMR. Palliative care will continue to provided support to patient, family and medical team.  Symptom Management:  Weakness:  Family report worsening weakness with no energy, report patient sleeping more. Patient was reported to test +ve for COVID last month, and was in isolation. No report of fever, chills, increased SOB, no evidence of infection noted today. Family report patient had benefited and actually had significant improvement in function with physical therapy in the past. Recommendation: recommend PT evaluation and treatment for strengthening and balance. Discussed trajectory of chronic diease with daughter and that patient may be appropriate for Hospice services if no improvement with PT. Daughter verbalized desire to discuss Hospice services if condition worsens or no improvement with PT. Provided general support and encouragement, no other unmet needs identified.  Follow up Palliative Care Visit: Palliative care will continue to follow for goals of care clarification and symptom management. Return in about 4-6 weeks or prn.  Family /Caregiver/Community Supports: Patient lives in the Assisted Living section of [facility]. She has 2 sons and a daughter. Her family very involved in her care. She has personal caregiver daily from 8:30 to 4:40pm. She is receives wound care from [facility] home [facility] nursing  Cognitive / Functional decline: Patient awake, alert, and coherent. She is currently dependent on staff for all of her ADLs. She is able to feed self finger foods, wheelchair dependent for ambulation. No recent report of falls.  I spent 50 minutes providing this consultation, time includes time spent with patient and family, chart review, provider coordination, and documentation. More than 50% of the time in this consultation was spent counseling and coordinating communication.   CHIEF COMPLAINT: weakness  History obtained from review of EMR, discussion with facility staff, interview with family, and caregiver. Records reviewed and  summarized bellow.  HISTORY OF PRESENT ILLNESS:  Yolanda Baker is a 87 y.o. year  old female with multiple medical problems including Dementia (FAST 7a), Paroxysmal Afib on Eliquis, HTN, HLD, depression/anxiety, systolic CHF with EF of 99-83%, arthritis, osteoarthritis, hx of CVA, hx of hip fracture managed conservatively with back brace due to not being a candiate for surgery. Patient with low HR, deemed not a candidate for pacemaker due to co- mobidities. Palliative Care was asked to follow this patient by consultation request of Yolanda Infante, MD to help address advance care planning and complex decision making. This is a follow up visit.  CODE STATUS: DNR  PPS: 40%  HOSPICE ELIGIBILITY/DIAGNOSIS: TBD  PHYSICAL EXAM / ROS:   General:  frail appearing, thin, sitting in wheelchair in dinning area finishing lunch Cardiovascular: no chest pain reported, no edema, S1S2 normal  Pulmonary: no cough, no increased SOB, room air GI: appetite good, endorses constipation, incontinent of bowel GU: denies dysuria, incontinent of urine MSK:  no joint and ROM abnormalities Skin: Wound to right ankle, skin tear to right lower arm- dressing clean and intact. Neurological: Weakness, but otherwise nonfocal Psych: non -anxious affect  PAST MEDICAL HISTORY:  Past Medical History:  Diagnosis Date  . Anemia   . Anxiety   . Arthritis    neck  . Bruises easily   . Chronic diastolic CHF (congestive heart failure) ([place])   . COPD (chronic obstructive pulmonary disease) ([place])   . Dementia ([place])   . Depression   . Dysrhythmia    hx A-FLUTTER after hip surg April 2015- take metoprolol  . Eczema   . Falls   . Hip fracture ([place])   . Hip joint pain    RT  . Hypertension   . Osteoporosis   . Persistent atrial fibrillation ([place]) 08/04/2019  . Pneumonia    august 2017  . Stroke [facility])     SOCIAL HX:  Social History   Tobacco Use  . Smoking status: Never Smoker  . Smokeless tobacco: Never Used  Substance Use Topics  . Alcohol use: No    Alcohol/week: 0.0 standard drinks   FAMILY  HX:  Family History  Problem Relation Age of Onset  . Heart attack Father   . Hypertension Father   . Hypertension Sister   . Stroke Sister   . Hypertension Brother     ALLERGIES:  Allergies  Allergen Reactions  . Flagyl [Metronidazole] Other (See Comments)    Fingers get "numb"     PERTINENT MEDICATIONS:  Outpatient Encounter Medications as of 03/17/2020  Medication Sig  . acetaminophen (TYLENOL) 500 MG tablet Take 500 mg by mouth 2 (two) times daily as needed.  Marland Kitchen apixaban (ELIQUIS) 2.5 MG TABS tablet Take 1 tablet (2.5 mg total) by mouth 2 (two) times daily.  . chlorhexidine (PERIDEX) 0.12 % solution Use as directed 15 mLs in the mouth or throat 2 (two) times daily. Swish and spit  . citalopram (CELEXA) 20 MG tablet Take 1 tablet (20 mg total) by mouth daily.  . furosemide (LASIX) 40 MG tablet Take 1 tablet (40 mg total) by mouth daily.  Marland Kitchen galantamine (RAZADYNE ER) 8 MG 24 hr capsule Take 8 mg by mouth daily with breakfast.  . hydrALAZINE (APRESOLINE) 25 MG tablet Take 1 tablet (25 mg total) by mouth every 8 (eight) hours.  Marland Kitchen ipratropium-albuterol (DUONEB) 0.5-2.5 (3) MG/3ML SOLN Take 3 mLs by nebulization every 6 (six) hours as needed (shortness of breath).  . loperamide (  IMODIUM) 2 MG capsule Take 2 mg by mouth 3 (three) times daily as needed for diarrhea or loose stools.   . metoprolol tartrate (LOPRESSOR) 25 MG tablet Take one tablet as needed for shortness of breath and heart rate greater than 110 beats per minute.  May take up to 50 mg in a 24 hour period.  . Multiple Vitamins-Minerals (CERTAVITE SENIOR/ANTIOXIDANT PO) Take 1 tablet by mouth daily.   Vladimir Faster Glycol-Propyl Glycol (SYSTANE) 0.4-0.3 % SOLN Place 1 drop into both eyes every hour as needed (dry eyes/irritation).  . potassium chloride SA (K-DUR,KLOR-CON) 20 MEQ tablet Take 20 mEq by mouth every other day.  . psyllium (REGULOID) 0.52 g capsule Take 0.52 g by mouth daily.  . Tiotropium Bromide-Olodaterol (STIOLTO  RESPIMAT) 2.5-2.5 MCG/ACT AERS Inhale 2 puffs into the lungs daily.  . ziprasidone (GEODON) 20 MG capsule TAKE ONE CAPSULE BY MOUTH DAILY IN THE AFTERNOON **DO NOT CRUSH** **NO REFILLS** (Patient taking differently: Take 20 mg by mouth daily at 2 PM. )   No facility-administered encounter medications on file as of 03/17/2020.    Thank you for the opportunity to participate in the care of Ms. Tatia Storey. The palliative care team will continue to follow. Please call our office at [phone] if we can be of additional assistance.  Jari Favre, DNP, AGPCNP-BC

## 2020-03-22 ENCOUNTER — Encounter: Payer: Self-pay | Admitting: Cardiovascular Disease

## 2020-03-22 ENCOUNTER — Ambulatory Visit: Payer: Medicare PPO | Admitting: Cardiovascular Disease

## 2020-03-22 ENCOUNTER — Other Ambulatory Visit: Payer: Self-pay

## 2020-03-22 VITALS — BP 165/71 | HR 63 | Ht 61.0 in

## 2020-03-22 DIAGNOSIS — E785 Hyperlipidemia, unspecified: Secondary | ICD-10-CM

## 2020-03-22 DIAGNOSIS — I10 Essential (primary) hypertension: Secondary | ICD-10-CM

## 2020-03-22 DIAGNOSIS — I5032 Chronic diastolic (congestive) heart failure: Secondary | ICD-10-CM

## 2020-03-22 DIAGNOSIS — I480 Paroxysmal atrial fibrillation: Secondary | ICD-10-CM | POA: Diagnosis not present

## 2020-03-22 DIAGNOSIS — R001 Bradycardia, unspecified: Secondary | ICD-10-CM

## 2020-03-22 NOTE — Progress Notes (Signed)
Cardiology Office Note   Date:  03/22/2020   ID:  MAKAHLA JOBIN, DOB 11/01/1923, MRN [id number]  PCP:  Rodrigo Ran, MD  Cardiologist:  Chilton Si, MD  Electrophysiologist:  None   Evaluation Performed:  Follow-Up Visit  Chief Complaint:  bradycardia  History of Present Illness:    Yolanda Baker is a 87 y.o. female with atrial fibrillation, hypertension, prior stroke, and dementia here for follow-up.  She was admitted 01/2019 with shortness of breath.  She was noted to be in new onset atrial fibrillation with rapid ventricular response.  She was mildly volume overloaded with a BNP of 335 and a pleural effusion on chest x-ray.  Creatinine was 1.8.  Symptoms improved with IV metoprolol and Lasix.  She was started on oral metoprolol and Lasix prior to discharge.  Echo at that time revealed LVEF 50 to 55% with moderate LVH and a possibility of cardiac amyloidosis.  Diastolic function was indeterminate.  Right ventricular function was normal.  She was seen by Edd Fabian, NP, on 01/2019.  The possibility of working her up for amyloidosis was discussed with her daughter and she like to not to pursue it.  She was admitted to the hospital 04/2019 with a right periprosthetic hip fracture.  This occurred in the setting of falling out of her bed.  Conservative treatment with physical therapy and hip bracing was elected.  She was then admitted 04/2019 with shortness of breath thought to be due to pneumonia.  She followed up with Verdon Cummins virtually on 04/2019 and was doing better.  Lately she has been feeling mostly well.  She notes that she is sometimes short of breath. She notices it most when she is active.  She sometimes notes it when laying in bed as well.  She has no orthopnea or PND.  Her weight has been stable around 100lb. She and her daughter have not noticed any edema.  She is still wearing a brace and is non-weight bearing.  She hopes to be able to walk soon but is waiting on a follow-up x-ray.   She has her blood pressure checked sporadically at her assisted living facility.  Her blood pressure tends to run in the 130s to 150s over 60s to 80s in the morning before taking her medication.  Later in the day it is in the 120s over 60s.  Her heart rate is pretty consistently in the 60s to 70s.  She denies palpitations.   Ms. Schnepper reported exertional dyspnea.  She did not appear to be volume overloaded on limited exam.  We recommended that she have a BNP checked.  This did not occur.  She was admitted 08/2019 with increasing shortness of breath.  She was hypoxic and tachycardic with atrial fibrillation with RVR.  CTA was negative for PE.  She did have pleural effusions and atelectasis.  She was treated with Lasix and rate control.  Echo revealed LVEF 45 to 50% with global hypokinesis.  Family requested cardiology consultation.  She was started on oral amiodarone.  Metoprolol was increased to 75mg .  She was again admitted 09/2019 with shortness of breath.  She was hypoxic to 88% on room air.  She was in atrial fibrillation that was rate-controlled.  She was again admitted 09/2019 with acute on chronic heart failure.  She was doing fine and then acutely became short of breath with physical therapy and Occupational Therapy.  She was hypoxic and BNP was 415.  She had bilateral pleural effusions that  were persistent from prior.  She required diuresis with IV Lasix.  At discharge Lasix was increased from 20 mg every other day to daily.  D-dimer was mildly elevated and CT-a was negative for PE.    Ms. Miranda was seen in the hospital 11/2019 with generalized weakness and bradycardia.  Amiodarone was held and metoprolol was reduced. Since then her PCP stopped amlodipine due to concern for bradycardia.  Since then her heart rate has remained low.  She reports feeling generally fatigued.  She broke her femur and was not a surgical candidate.  She had to stop participating with physical therapy because her heart rate got too  high when she exercised.  She is now essentially bedbound or in her wheelchair.  [name] is being cared for by palliative care.  Since her last appointment she has seen the atrial fibrillation team.  She was hospitalized due to bradycardia.  This resolved with stopping amiodarone.  There were no episodes of syncope.  Given that she was so frail her family elected palliative therapy rather than getting a pacemaker.  She notes occasional pain in her chest.  It happens at least once per week.  It is associated with some shortness of breath.  It is worse when she touches her chest.  She does not exert herself as she is essentially bed and chair bound.  She has not had any lower extremity edema.  She does have a sore on her right ankle that is being dressed twice per week.  She denies orthopnea or PND.  She has metoprolol to be taken as needed for tachycardia but has not needed any.  Overall she is feeling well.  Her main goal is to stay the hospital.  She is unsure what her blood pressure has been running lately.  Past Medical History:  Diagnosis Date  . Anemia   . Anxiety   . Arthritis    neck  . Bruises easily   . Chronic diastolic CHF (congestive heart failure) ([facility])   . Chronic diastolic heart failure ([facility])   . COPD (chronic obstructive pulmonary disease) ([facility])   . Dementia ([facility])   . Depression   . Dysrhythmia    hx A-FLUTTER after hip surg April 2015- take metoprolol  . Eczema   . Falls   . Hip fracture ([facility])   . Hip joint pain    RT  . Hypertension   . Osteoporosis   . Persistent atrial fibrillation ([facility]) 08/04/2019  . Pneumonia    august 2017  . Stroke [facility])    Past Surgical History:  Procedure Laterality Date  . ABDOMINAL HYSTERECTOMY    . FRACTURE SURGERY Right 06/05/2013   hip  . HIP ARTHROPLASTY Right 10/03/2013   Procedure: CONVERSION OF PREVIOUS RIGHT HIP SURGERY TO A RIGHT TOTAL HIP ARTHROPLASTY;  Surgeon: Loanne Drilling, MD;  Location: [facility] ORS;  Service: Orthopedics;   Laterality: Right;  . INTRAMEDULLARY (IM) NAIL INTERTROCHANTERIC Right 06/05/2013   Procedure: INTRAMEDULLARY (IM) NAIL INTERTROCHANTRIC;  Surgeon: Loanne Drilling, MD;  Location: [facility];  Service: Orthopedics;  Laterality: Right;  . JOINT REPLACEMENT     left hip  . left hip Left 2008  . LEG SURGERY Right    x2     Current Meds  Medication Sig  . acetaminophen (TYLENOL) 500 MG tablet Take 500 mg by mouth 2 (two) times daily as needed.  Marland Kitchen apixaban (ELIQUIS) 2.5 MG TABS tablet Take 1 tablet (2.5 mg total) by mouth 2 (  two) times daily.  . chlorhexidine (PERIDEX) 0.12 % solution Use as directed 15 mLs in the mouth or throat 2 (two) times daily. Swish and spit  . citalopram (CELEXA) 20 MG tablet Take 1 tablet (20 mg total) by mouth daily.  . diclofenac Sodium (VOLTAREN) 1 % GEL Apply 2 g topically as directed. APPLY TWICE A DAY TO CHEST FOR 1 WEEK AND THEN AS NEEDED FOR CHEST SORENESS  . furosemide (LASIX) 40 MG tablet Take 1 tablet (40 mg total) by mouth daily.  Marland Kitchen. galantamine (RAZADYNE ER) 8 MG 24 hr capsule Take 8 mg by mouth daily with breakfast.  . hydrALAZINE (APRESOLINE) 25 MG tablet Take 37.5 mg by mouth every 8 (eight) hours.  Marland Kitchen. ipratropium-albuterol (DUONEB) 0.5-2.5 (3) MG/3ML SOLN Take 3 mLs by nebulization every 6 (six) hours as needed (shortness of breath).  . loperamide (IMODIUM) 2 MG capsule Take 2 mg by mouth 3 (three) times daily as needed for diarrhea or loose stools.   . metoprolol tartrate (LOPRESSOR) 25 MG tablet Take one tablet as needed for shortness of breath and heart rate greater than 110 beats per minute.  May take up to 50 mg in a 24 hour period.  . Multiple Vitamins-Minerals (CERTAVITE SENIOR/ANTIOXIDANT PO) Take 1 tablet by mouth daily.   Bertram Gala. Polyethyl Glycol-Propyl Glycol (SYSTANE) 0.4-0.3 % SOLN Place 1 drop into both eyes every hour as needed (dry eyes/irritation).  . potassium chloride SA (K-DUR,KLOR-CON) 20 MEQ tablet Take 20 mEq by mouth every other day.  .  psyllium (REGULOID) 0.52 g capsule Take 0.52 g by mouth daily.  . Tiotropium Bromide-Olodaterol (STIOLTO RESPIMAT) 2.5-2.5 MCG/ACT AERS Inhale 2 puffs into the lungs daily.  . ziprasidone (GEODON) 20 MG capsule TAKE ONE CAPSULE BY MOUTH DAILY IN THE AFTERNOON **DO NOT CRUSH** **NO REFILLS** (Patient taking differently: Take 20 mg by mouth daily at 2 PM.)  . [DISCONTINUED] hydrALAZINE (APRESOLINE) 25 MG tablet Take 1 tablet (25 mg total) by mouth every 8 (eight) hours.     Allergies:   Flagyl [metronidazole]   Social History   Tobacco Use  . Smoking status: Never Smoker  . Smokeless tobacco: Never Used  Vaping Use  . Vaping Use: Never used  Substance Use Topics  . Alcohol use: No    Alcohol/week: 0.0 standard drinks  . Drug use: No     Family Hx: The patient's family history includes Heart attack in her father; Hypertension in her brother, father, and sister; Stroke in her sister.  ROS:   Please see the history of present illness.     All other systems reviewed and are negative.   Prior CV studies:   The following studies were reviewed today:  Echo 01/2019: 1. Left ventricular ejection fraction, by visual estimation, is 50 to  55%. The left ventricle has low normal function. There is moderately  increased left ventricular hypertrophy. The LV morphology could be  consistent with cardiac amyloidosis.  2. Left ventricular diastolic parameters are indeterminate.  3. The left ventricle has no regional wall motion abnormalities.  4. Global right ventricle has normal systolic function.The right  ventricular size is normal. No increase in right ventricular wall  thickness.  5. Left atrial size was mildly dilated.  6. Right atrial size was mildly dilated.  7. Trivial pericardial effusion is present.  8. The mitral valve is normal in structure. Mild mitral valve  regurgitation. No evidence of mitral stenosis.  9. The tricuspid valve is normal in structure. Tricuspid valve  regurgitation is trivial.  10. The aortic valve is tricuspid. Aortic valve regurgitation is not  visualized. No evidence of aortic valve sclerosis or stenosis.  11. The tricuspid regurgitant velocity is 2.48 m/s, and with an assumed  right atrial pressure of 8 mmHg, the estimated right ventricular systolic  pressure is mildly elevated at 32.6 mmHg.  12. The inferior vena cava is normal in size with <50% respiratory  variability, suggesting right atrial pressure of 8 mmHg.   Echo 08/2019: 1. Left ventricular ejection fraction, by estimation, is 45 to 50%. The  left ventricle has mildly decreased function. The left ventricle  demonstrates global hypokinesis. There is mild left ventricular  hypertrophy. Left ventricular diastolic parameters  are indeterminate.  2. Right ventricular systolic function is normal. The right ventricular  size is normal. There is normal pulmonary artery systolic pressure. The  estimated right ventricular systolic pressure is 27.6 mmHg.  3. Left atrial size was moderately dilated.  4. Right atrial size was mildly dilated.  5. The mitral valve is normal in structure. Mild mitral valve  regurgitation. No evidence of mitral stenosis.  6. Tricuspid valve regurgitation is moderate.  7. The aortic valve is tricuspid. Aortic valve regurgitation is not  visualized. Mild aortic valve sclerosis is present, with no evidence of  aortic valve stenosis.  8. The inferior vena cava is normal in size with greater than 50%  respiratory variability, suggesting right atrial pressure of 3 mmHg.   Labs/Other Tests and Data Reviewed:    EKG:  No ECG reviewed.   Recent Labs: 10/16/2019: B Natriuretic Peptide 415.6 11/27/2019: Magnesium 1.9; TSH 14.929 11/28/2019: ALT 33 12/03/2019: BUN 19; Creatinine, Ser 1.13; Hemoglobin 10.6; Platelets 240; Potassium 3.5; Sodium 139   Recent Lipid Panel Lab Results  Component Value Date/Time   CHOL 148 06/30/2014 02:46 AM   TRIG 43  06/30/2014 02:46 AM   HDL 59 06/30/2014 02:46 AM   CHOLHDL 2.5 06/30/2014 02:46 AM   LDLCALC 80 06/30/2014 02:46 AM    Wt Readings from Last 3 Encounters:  01/19/20 105 lb (47.6 kg)  01/11/20 105 lb (47.6 kg)  12/21/19 105 lb (47.6 kg)     Objective:    VS:  BP (!) 165/71   Pulse 63   Ht 5\' 1"  (1.549 m)   SpO2 100%   BMI 19.84 kg/m  , BMI Body mass index is 19.84 kg/m. GENERAL:  Well appearing.  Frail.  No acute distress HEENT: Pupils equal round and reactive, fundi not visualized, oral mucosa unremarkable NECK:  No jugular venous distention, waveform within normal limits, carotid upstroke brisk and symmetric, no bruits, no thyromegaly LYMPHATICS:  No cervical adenopathy LUNGS:  Clear to auscultation bilaterally HEART: RRR.  PMI not displaced or sustained,S1 and S2 within normal limits, no S3, no S4, no clicks, no rubs, no murmurs ABD:  Flat, positive bowel sounds normal in frequency in pitch, no bruits, no rebound, no guarding, no midline pulsatile mass, no hepatomegaly, no splenomegaly EXT:  2 plus pulses throughout, no edema, no cyanosis no clubbing SKIN:  No rashes no nodules NEURO:  Cranial nerves II through XII grossly intact, motor grossly intact throughout PSYCH:  Cognitively intact, oriented to person place and time   ASSESSMENT & PLAN:    # Persistent atrial fibrillation: # Sick sinus syndrome: Ms. Broadnax is currently in sinus rhythm.  She is not taking metoprolol.  Amiodarone was discontinued due to bradycardia.  She is not a candidate for pacemaker due to her  frailty and comorbidities.  Her family has metoprolol to take as needed.  Her main goal is to stay at the hospital.  If she does have worsening bradycardia recommended that she be upgraded from palliative care to hospice.  # Chronic systolic and diastolic heart failure: # Hypertension:  Ms. Kerper has been admitted multiple times with heart failure exacerbations.  Volume and weight remain stable.  Her blood  pressure has been over 150 when checked in her facility and is high today as well.  We will be gentle with her blood pressure given her issues with potential for bradycardia.  We will increase hydralazine to 37.5 mg 3 times a day.  Her facility will check her BP daily for one week.  Continue metoprolol.  #Chest pain: She has chest wall tenderness on exam.  I suspect her discomfort is musculoskeletal.  She is unable to transfer unassisted and may have some strain from trying to help them lift her.  We will use diclofenac topical twice daily for 7 days and then as needed.  Blood pressure control as above  # Hyperlipidemia:  Simvastatin discontinued due to being on palliative care.   # Palliative care:  Patient is receiving palliative care services.  Her family's goal is to keep her out of the hospital.  She has signed a DNR.  Medication Adjustments/Labs and Tests Ordered: Current medicines are reviewed at length with the patient today.  Concerns regarding medicines are outlined above.   Tests Ordered: No orders of the defined types were placed in this encounter.   Medication Changes: No orders of the defined types were placed in this encounter.   Follow Up:  In Person in 3 month(s).  Afib clinic in 4 months  Signed, Chilton Si, MD  03/22/2020 12:27 PM    [facility]

## 2020-03-22 NOTE — Patient Instructions (Signed)
Medication Instructions:  INCREASE HYDRALAZINE TO 37.5 MG EVERY 8 HOURS   USE DICLOFENAC GEL 1% 2 G APPLY TO CHEST TWICE A DAY FOR 2 WEEKS AND THEN AS NEEDED FOR MUSCULOSKELETAL PAIN   Lab Work: NONE   Testing/Procedures: NONE   Follow-Up: At [facility], you and your health needs are our priority.  As part of our continuing mission to provide you with exceptional heart care, we have created designated Provider Care Teams.  These Care Teams include your primary Cardiologist (physician) and Advanced Practice Providers (APPs -  Physician Assistants and Nurse Practitioners) who all work together to provide you with the care you need, when you need it.  We recommend signing up for the patient portal called "MyChart".  Sign up information is provided on this After Visit Summary.  MyChart is used to connect with patients for Virtual Visits (Telemedicine).  Patients are able to view lab/test results, encounter notes, upcoming appointments, etc.  Non-urgent messages can be sent to your provider as well.   To learn more about what you can do with MyChart, go to [email or web address].    Your next appointment:   4 month(s)  The format for your next appointment:   IN PERSON OR VIRTUAL   Provider:   You may see Chilton Si, MD or one of the following Advanced Practice Providers on your designated Care Team:    Corine Shelter, PA-C  [place], [place]  Edd Fabian, [place]  Other Instructions MONITOR BLOOD PRESSURE DAILY FOR 1 WEEK FAX RESULTS TO [phone]

## 2020-04-20 ENCOUNTER — Telehealth: Payer: Self-pay | Admitting: Cardiovascular Disease

## 2020-04-20 NOTE — Telephone Encounter (Signed)
Pt c/o medication issue:  1. Name of Medication: hydrALAZINE (APRESOLINE) 25 MG tablet  2. How are you currently taking this medication (dosage and times per day)? N/a   3. Are you having a reaction (difficulty breathing--STAT)? N/a   4. What is your medication issue?   Yolanda Baker with [facility] states he received they Rx for this medication and the order is for Hydralazine 100 MG.  He requested a call back to confirm that her medication has been increased. Please return call to discuss at [phone].

## 2020-04-20 NOTE — Telephone Encounter (Signed)
Discussed with Raquel, Dr Duke Salvia out of the office Given patients age, will increase Hydralazine to 50 mg every 8 hours  Advised Hope Budds to call [employer], no answer

## 2020-05-04 NOTE — Telephone Encounter (Signed)
Confirmed with Hassel Neth patient taking Hydralazine 50 mg every 8 hours

## 2020-07-07 ENCOUNTER — Encounter: Payer: Self-pay | Admitting: Cardiovascular Disease

## 2020-07-07 ENCOUNTER — Telehealth (INDEPENDENT_AMBULATORY_CARE_PROVIDER_SITE_OTHER): Payer: Medicare PPO | Admitting: Cardiovascular Disease

## 2020-07-07 VITALS — BP 133/71 | HR 73 | Ht 61.0 in

## 2020-07-07 DIAGNOSIS — I10 Essential (primary) hypertension: Secondary | ICD-10-CM

## 2020-07-07 DIAGNOSIS — I4891 Unspecified atrial fibrillation: Secondary | ICD-10-CM | POA: Diagnosis not present

## 2020-07-07 DIAGNOSIS — I5032 Chronic diastolic (congestive) heart failure: Secondary | ICD-10-CM | POA: Diagnosis not present

## 2020-07-07 NOTE — Progress Notes (Signed)
Virtual Visit via Video Note   This visit type was conducted due to national recommendations for restrictions regarding the COVID-19 Pandemic (e.g. social distancing) in an effort to limit this patient's exposure and mitigate transmission in our community.  Due to her co-morbid illnesses, this patient is at least at moderate risk for complications without adequate follow up.  This format is felt to be most appropriate for this patient at this time.  All issues noted in this document were discussed and addressed.  A limited physical exam was performed with this format.  Please refer to the patient's chart for her consent to telehealth for [facility].  The patient was identified using 2 identifiers.  Date:  07/07/2020   ID:  Yolanda Baker, DOB [birth year], MRN [id number]  Patient Location: Home Provider Location: Office/Clinic  PCP:  Rodrigo RanPerini, Mark, MD  Cardiologist:  Chilton Siiffany [place], MD  Electrophysiologist:  None   Evaluation Performed:  Follow-Up Visit  Chief Complaint:    History of Present Illness:    The patient does not have symptoms concerning for COVID-19 infection (fever, chills, cough, or new shortness of breath).    Yolanda Baker is a 87 y.o. female with atrial fibrillation, hypertension, prior stroke, and dementia being seen today for follow up.  She was admitted 01/2019 with shortness of breath.  She was noted to be in new onset atrial fibrillation with rapid ventricular response.  She was mildly volume overloaded with a BNP of 335 and a pleural effusion on chest x-ray.  Creatinine was 1.8.  Symptoms improved with IV metoprolol and Lasix.  She was started on oral metoprolol and Lasix prior to discharge.  Echo at that time revealed LVEF 50 to 55% with moderate LVH and a possibility of cardiac amyloidosis.  Diastolic function was indeterminate.  Right ventricular function was normal.  She was seen by Edd FabianJesse Cleaver, NP, on 01/2019.  The possibility of working her up for  amyloidosis was discussed with her daughter and she like to not to pursue it.  She was admitted to the hospital 04/2019 with a right periprosthetic hip fracture.  This occurred in the setting of falling out of her bed.  Conservative treatment with physical therapy and hip bracing was elected.  She was then admitted 04/2019 with shortness of breath thought to be due to pneumonia.  She followed up with Verdon CumminsJesse virtually on 04/2019 and was doing better.  Lately she has been feeling mostly well.  She notes that she is sometimes short of breath. She notices it most when she is active.  She sometimes notes it when laying in bed as well.  She has no orthopnea or PND.  Her weight has been stable around 100lb. She and her daughter have not noticed any edema.  She is still wearing a brace and is non-weight bearing.  She hopes to be able to walk soon but is waiting on a follow-up x-ray.  She has her blood pressure checked sporadically at her assisted living facility.  Her blood pressure tends to run in the 130s to 150s over 60s to 80s in the morning before taking her medication.  Later in the day it is in the 120s over 60s.  Her heart rate is pretty consistently in the 60s to 70s.  She denies palpitations.   Yolanda Baker reported exertional dyspnea.  She did not appear to be volume overloaded on limited exam.  We recommended that she have a BNP checked.  This did not occur.  She was admitted 08/2019 with increasing shortness of breath.  She was hypoxic and tachycardic with atrial fibrillation with RVR.  CTA was negative for PE.  She did have pleural effusions and atelectasis.  She was treated with Lasix and rate control.  Echo revealed LVEF 45 to 50% with global hypokinesis.  Family requested cardiology consultation.  She was started on oral amiodarone.  Metoprolol was increased to 75mg .  She was again admitted 09/2019 with shortness of breath.  She was hypoxic to 88% on room air.  She was in atrial fibrillation that was  rate-controlled.  She was again admitted 09/2019 with acute on chronic heart failure.  She was doing fine and then acutely became short of breath with physical therapy and Occupational Therapy.  She was hypoxic and BNP was 415.  She had bilateral pleural effusions that were persistent from prior.  She required diuresis with IV Lasix.  At discharge Lasix was increased from 20 mg every other day to daily.  D-dimer was mildly elevated and CT-a was negative for PE.    Yolanda Baker was seen in the hospital 11/2019 with generalized weakness and bradycardia.  Amiodarone was held and metoprolol was reduced. Since then her PCP stopped amlodipine due to concern for bradycardia.  Since then her heart rate has remained low.  She reports feeling generally fatigued.  She broke her femur and was not a surgical candidate.  She had to stop participating with physical therapy because her heart rate got too high when she exercised.  She is now essentially bedbound or in her wheelchair.  Yolanda Baker is being cared for by palliative care.  Since her last appointment she has seen the atrial fibrillation team.  She was hospitalized due to bradycardia.  This resolved with stopping amiodarone.  There were no episodes of syncope.  Given that she was so frail her family elected palliative therapy rather than getting a pacemaker.  At her last appointment she reported some chest wall tenderness that was not thought to be ischemic.  She was given Voltaren and this helped.  She no longer has the chest pain.  Hydralazine was increased due to poorly controlled blood pressure.  Since that time she has been feeling well.  Her blood pressure has been mostly in the 120s to low 130s.  She still has some shortness of breath at rest but no orthopnea or PND.  Her daughter puts compression socks on her so she has no lower extremity edema.  She is able to stand and walk a few steps but not long distances.  She is still able to assist with transfers.  Overall she  is doing well and has been very stable.  Past Medical History:  Diagnosis Date  . Anemia   . Anxiety   . Arthritis    neck  . Bruises easily   . Chronic diastolic CHF (congestive heart failure) (HCC)   . Chronic diastolic heart failure (HCC)   . COPD (chronic obstructive pulmonary disease) (HCC)   . Dementia (HCC)   . Depression   . Dysrhythmia    hx A-FLUTTER after hip surg April 2015- take metoprolol  . Eczema   . Falls   . Hip fracture (HCC)   . Hip joint pain    RT  . Hypertension   . Osteoporosis   . Persistent atrial fibrillation (HCC) 08/04/2019  . Pneumonia    august 2017  . Stroke [facility])    Past Surgical History:  Procedure Laterality Date  .  ABDOMINAL HYSTERECTOMY    . FRACTURE SURGERY Right 06/05/2013   hip  . HIP ARTHROPLASTY Right 10/03/2013   Procedure: CONVERSION OF PREVIOUS RIGHT HIP SURGERY TO A RIGHT TOTAL HIP ARTHROPLASTY;  Surgeon: Loanne Drilling, MD;  Location: [facility]S;  Service: Orthopedics;  Laterality: Right;  . INTRAMEDULLARY (IM) NAIL INTERTROCHANTERIC Right 06/05/2013   Procedure: INTRAMEDULLARY (IM) NAIL INTERTROCHANTRIC;  Surgeon: Loanne Drilling, MD;  Location: [facility];  Service: Orthopedics;  Laterality: Right;  . JOINT REPLACEMENT     left hip  . left hip Left 2008  . LEG SURGERY Right    x2     Current Meds  Medication Sig  . acetaminophen (TYLENOL) 500 MG tablet Take 500 mg by mouth 2 (two) times daily as needed.  Marland Kitchen apixaban (ELIQUIS) 2.5 MG TABS tablet Take 1 tablet (2.5 mg total) by mouth 2 (two) times daily.  . chlorhexidine (PERIDEX) 0.12 % solution Use as directed 15 mLs in the mouth or throat 2 (two) times daily. Swish and spit  . citalopram (CELEXA) 20 MG tablet Take 1 tablet (20 mg total) by mouth daily.  . furosemide (LASIX) 40 MG tablet Take 1 tablet (40 mg total) by mouth daily. (Patient taking differently: Take 20 mg by mouth daily.)  . galantamine (RAZADYNE ER) 8 MG 24 hr capsule Take 8 mg by mouth daily with breakfast.  .  hydrALAZINE (APRESOLINE) 50 MG tablet Take 50 mg by mouth 3 (three) times daily.  Marland Kitchen ipratropium-albuterol (DUONEB) 0.5-2.5 (3) MG/3ML SOLN Take 3 mLs by nebulization every 6 (six) hours as needed (shortness of breath).  . loperamide (IMODIUM) 2 MG capsule Take 2 mg by mouth 3 (three) times daily as needed for diarrhea or loose stools.   . metoprolol tartrate (LOPRESSOR) 25 MG tablet Take one tablet as needed for shortness of breath and heart rate greater than 110 beats per minute.  May take up to 50 mg in a 24 hour period.  . Multiple Vitamins-Minerals (CERTAVITE SENIOR/ANTIOXIDANT PO) Take 1 tablet by mouth daily.   Bertram Gala Glycol-Propyl Glycol (SYSTANE) 0.4-0.3 % SOLN Place 1 drop into both eyes every hour as needed (dry eyes/irritation).  . potassium chloride SA (K-DUR,KLOR-CON) 20 MEQ tablet Take 20 mEq by mouth every other day.  . psyllium (REGULOID) 0.52 g capsule Take 0.52 g by mouth daily.  . Tiotropium Bromide-Olodaterol (STIOLTO RESPIMAT) 2.5-2.5 MCG/ACT AERS Inhale 2 puffs into the lungs daily.  . ziprasidone (GEODON) 20 MG capsule TAKE ONE CAPSULE BY MOUTH DAILY IN THE AFTERNOON **DO NOT CRUSH** **NO REFILLS** (Patient taking differently: Take 20 mg by mouth daily at 2 PM.)  . [DISCONTINUED] diclofenac Sodium (VOLTAREN) 1 % GEL Apply 2 g topically as directed. APPLY TWICE A DAY TO CHEST FOR 1 WEEK AND THEN AS NEEDED FOR CHEST SORENESS     Allergies:   Flagyl [metronidazole]   Social History   Tobacco Use  . Smoking status: Never Smoker  . Smokeless tobacco: Never Used  Vaping Use  . Vaping Use: Never used  Substance Use Topics  . Alcohol use: No    Alcohol/week: 0.0 standard drinks  . Drug use: No     Family Hx: The patient's family history includes Heart attack in her father; Hypertension in her brother, father, and sister; Stroke in her sister.  ROS:   Please see the history of present illness.     All other systems reviewed and are negative.   Prior CV studies:    The following  studies were reviewed today:  Echo 01/2019: 1. Left ventricular ejection fraction, by visual estimation, is 50 to  55%. The left ventricle has low normal function. There is moderately  increased left ventricular hypertrophy. The LV morphology could be  consistent with cardiac amyloidosis.  2. Left ventricular diastolic parameters are indeterminate.  3. The left ventricle has no regional wall motion abnormalities.  4. Global right ventricle has normal systolic function.The right  ventricular size is normal. No increase in right ventricular wall  thickness.  5. Left atrial size was mildly dilated.  6. Right atrial size was mildly dilated.  7. Trivial pericardial effusion is present.  8. The mitral valve is normal in structure. Mild mitral valve  regurgitation. No evidence of mitral stenosis.  9. The tricuspid valve is normal in structure. Tricuspid valve  regurgitation is trivial.  10. The aortic valve is tricuspid. Aortic valve regurgitation is not  visualized. No evidence of aortic valve sclerosis or stenosis.  11. The tricuspid regurgitant velocity is 2.48 m/s, and with an assumed  right atrial pressure of 8 mmHg, the estimated right ventricular systolic  pressure is mildly elevated at 32.6 mmHg.  12. The inferior vena cava is normal in size with <50% respiratory  variability, suggesting right atrial pressure of 8 mmHg.   Echo 08/2019: 1. Left ventricular ejection fraction, by estimation, is 45 to 50%. The  left ventricle has mildly decreased function. The left ventricle  demonstrates global hypokinesis. There is mild left ventricular  hypertrophy. Left ventricular diastolic parameters  are indeterminate.  2. Right ventricular systolic function is normal. The right ventricular  size is normal. There is normal pulmonary artery systolic pressure. The  estimated right ventricular systolic pressure is 27.6 mmHg.  3. Left atrial size was moderately dilated.   4. Right atrial size was mildly dilated.  5. The mitral valve is normal in structure. Mild mitral valve  regurgitation. No evidence of mitral stenosis.  6. Tricuspid valve regurgitation is moderate.  7. The aortic valve is tricuspid. Aortic valve regurgitation is not  visualized. Mild aortic valve sclerosis is present, with no evidence of  aortic valve stenosis.  8. The inferior vena cava is normal in size with greater than 50%  respiratory variability, suggesting right atrial pressure of 3 mmHg.   Labs/Other Tests and Data Reviewed:    EKG:  No ECG reviewed.   Recent Labs: 10/16/2019: B Natriuretic Peptide 415.6 11/27/2019: Magnesium 1.9; TSH 14.929 11/28/2019: ALT 33 12/03/2019: BUN 19; Creatinine, Ser 1.13; Hemoglobin 10.6; Platelets 240; Potassium 3.5; Sodium 139   Recent Lipid Panel Lab Results  Component Value Date/Time   CHOL 148 06/30/2014 02:46 AM   TRIG 43 06/30/2014 02:46 AM   HDL 59 06/30/2014 02:46 AM   CHOLHDL 2.5 06/30/2014 02:46 AM   LDLCALC 80 06/30/2014 02:46 AM    Wt Readings from Last 3 Encounters:  01/19/20 105 lb (47.6 kg)  01/11/20 105 lb (47.6 kg)  12/21/19 105 lb (47.6 kg)     Objective:    BP 133/71   Pulse 73   Ht 5\' 1"  (1.549 m)   BMI 19.84 kg/m  GENERAL: Well-appearing.  No acute distress.  Frail.  Resting tremor. HEENT: Pupils equal round.  Oral mucosa unremarkable NECK:  No jugular venous distention, no visible thyromegaly EXT:  No edema, no cyanosis no clubbing SKIN:  No rashes no nodules NEURO:  Speech fluent.  Cranial nerves grossly intact.  Moves all 4 extremities freely PSYCH:  Cognitively intact, oriented  to person place and time   ASSESSMENT & PLAN:    # Persistent atrial fibrillation: # Sick sinus syndrome: Unable to assess her rhythm given that this is a telehealth visit.  Her heart rates have been stable when checked by her staff.  She is not taking metoprolol.  Amiodarone was discontinued due to bradycardia.  She is  not a candidate for pacemaker due to her frailty and comorbidities.  Her family has metoprolol to take as needed.  Her main goal is to stay at the hospital.   # Chronic systolic and diastolic heart failure: # Hypertension:  Ms. Gallaway has been admitted multiple times with heart failure exacerbations.  Lately her weight has been stable.  Continue hydralazine, furosemide, and metoprolol as needed.  #Chest pain: She previously had musculoskeletal pain that was responsive to Voltaren.  She is no longer needing this.  We will have him take this off her medication list and she will let us know if she needs in the future.  # Hyperlipidemia:  Simvastatin discontinued due to being on palliative care.   # Palliative care:  Patient is receiving palliative care services.  Her family's goal is to keep her out of the hospital.  She has signed a DNR.  Medication Adjustments/Labs and Tests Ordered: Current medicines are reviewed at length with the patient today.  Concerns regarding medicines are outlined above.   Tests Ordered: No orders of the defined types were placed in this encounter.   Medication Changes: No orders of the defined types were placed in this encounter.   Follow Up: In 6 months   COVID-19 Education: The signs and symptoms of COVID-19 were discussed with the patient and how to seek care for testing (follow up with PCP or arrange E-visit).  The importance of social distancing was discussed today.  Time:   Today, I have spent 17 minutes with the patient with telehealth technology discussing the above problems.     Signed, Chilton Si, MD  07/07/2020 11:31 AM    [facility]

## 2020-07-07 NOTE — Patient Instructions (Addendum)
Medication Instructions:  STOP DICLOFENAC (VOLTAREN) GEL   *If you need a refill on your cardiac medications before your next appointment, please call your pharmacy*  Lab Work: NONE   Testing/Procedures: NONE  Follow-Up: At [facility], you and your health needs are our priority.  As part of our continuing mission to provide you with exceptional heart care, we have created designated Provider Care Teams.  These Care Teams include your primary Cardiologist (physician) and Advanced Practice Providers (APPs -  Physician Assistants and Nurse Practitioners) who all work together to provide you with the care you need, when you need it.  We recommend signing up for the patient portal called "MyChart".  Sign up information is provided on this After Visit Summary.  MyChart is used to connect with patients for Virtual Visits (Telemedicine).  Patients are able to view lab/test results, encounter notes, upcoming appointments, etc.  Non-urgent messages can be sent to your provider as well.   To learn more about what you can do with MyChart, go to [email or web address].    Your next appointment:   6 month(s)  The format for your next appointment:   Virtual Visit   Provider:   DR [facility] OR APP

## 2020-07-17 IMAGING — CR DG HIP (WITH OR WITHOUT PELVIS) 2-3V*R*
3 series · 3 of 3 positions shown · non-contrast
Comparison: Pelvis and left hip radiograph 03/02/2015

CLINICAL DATA: Post fall. Right hip pain with weight-bearing and
movement. Slip and fall 2 nights ago getting out of bed.

EXAM:
DG HIP (WITH OR WITHOUT PELVIS) 2-3V RIGHT

[w hip lat right]
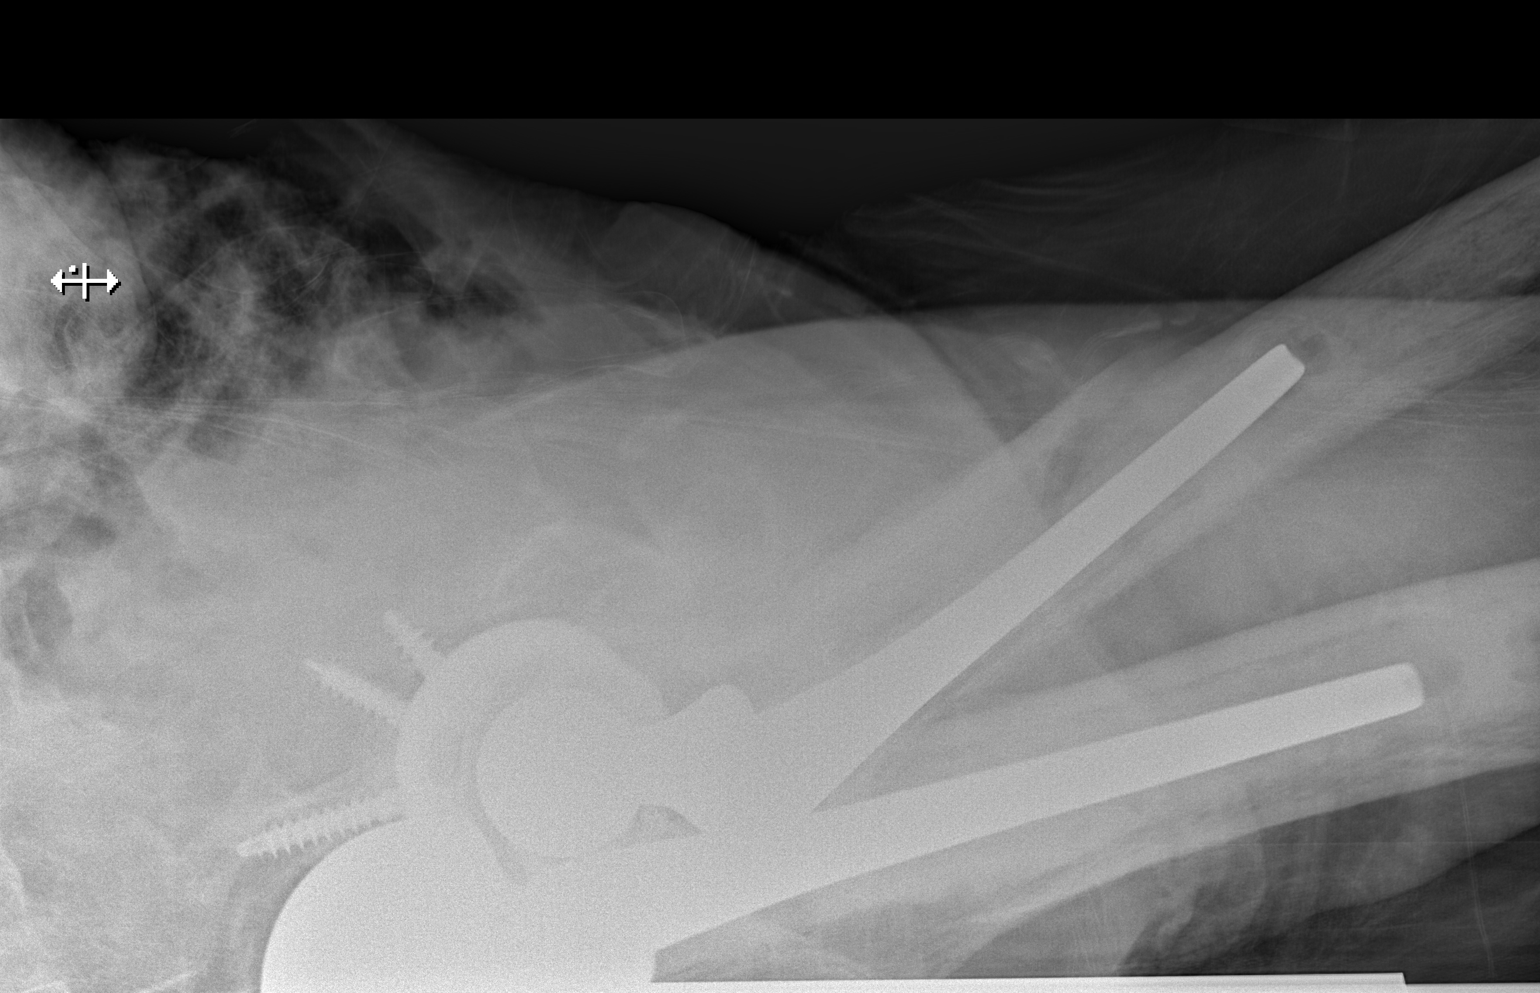

[x pelvis]
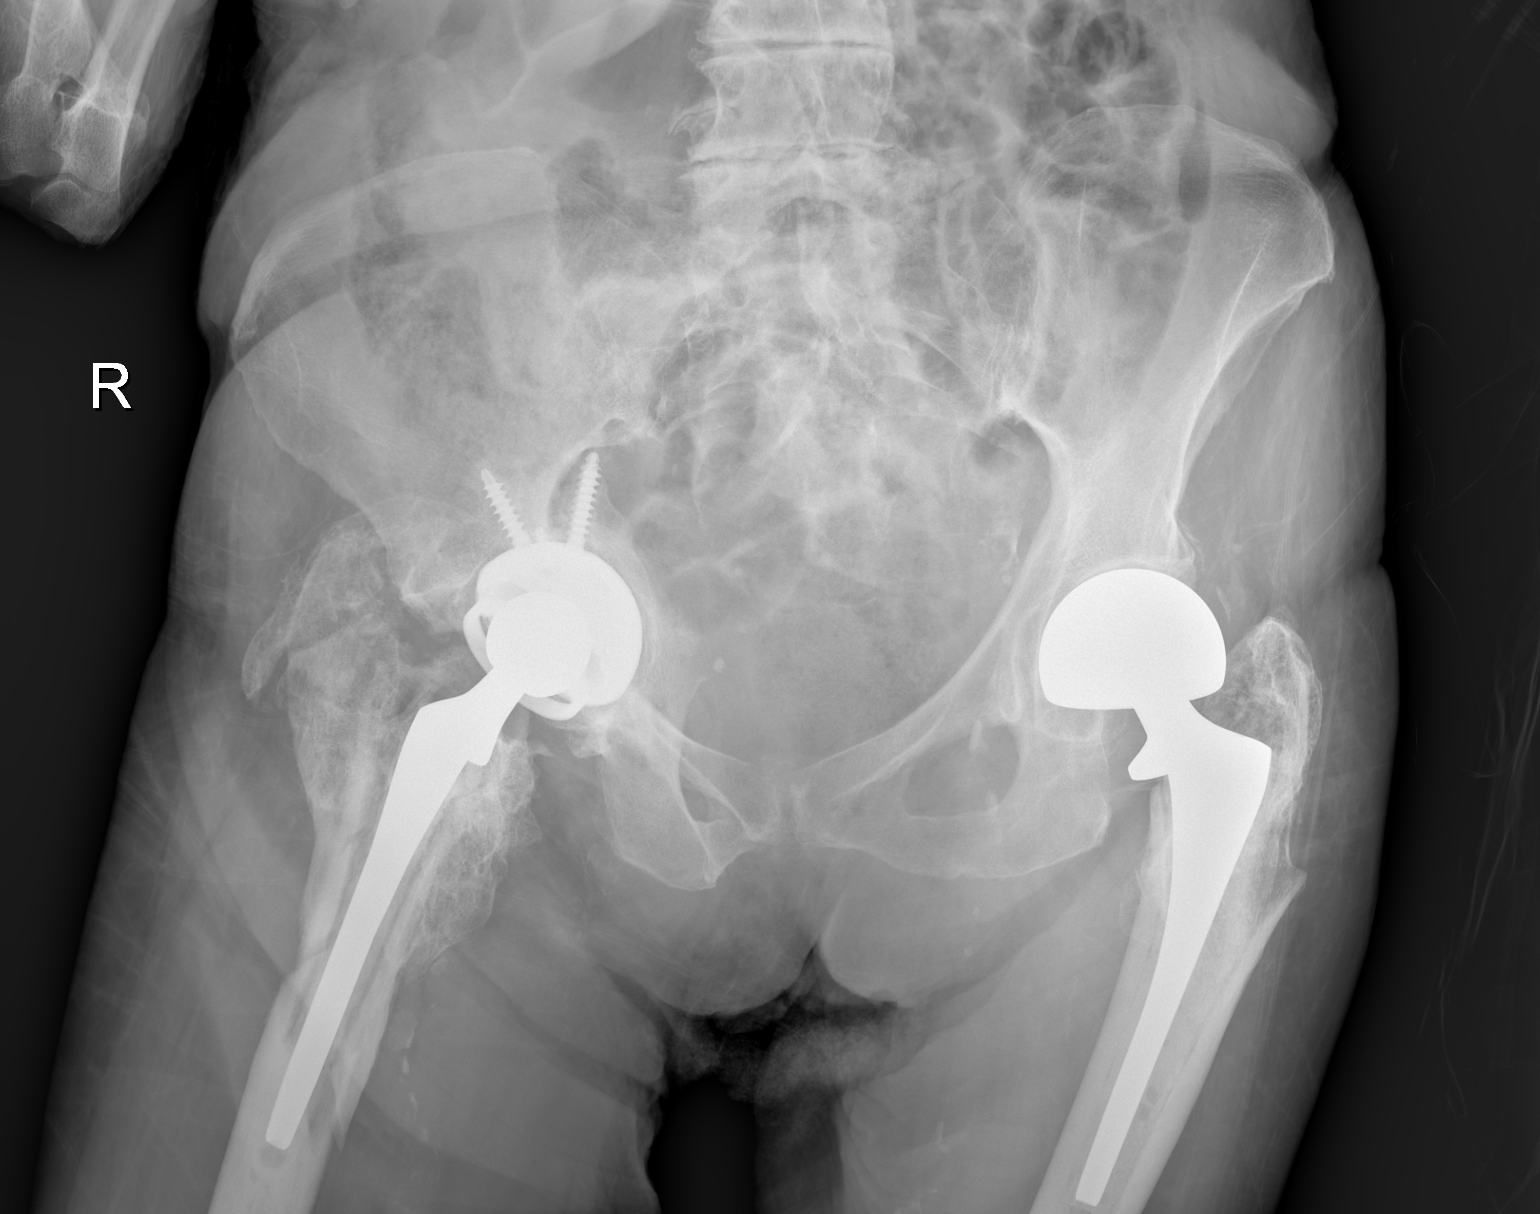

[x hip ap right]
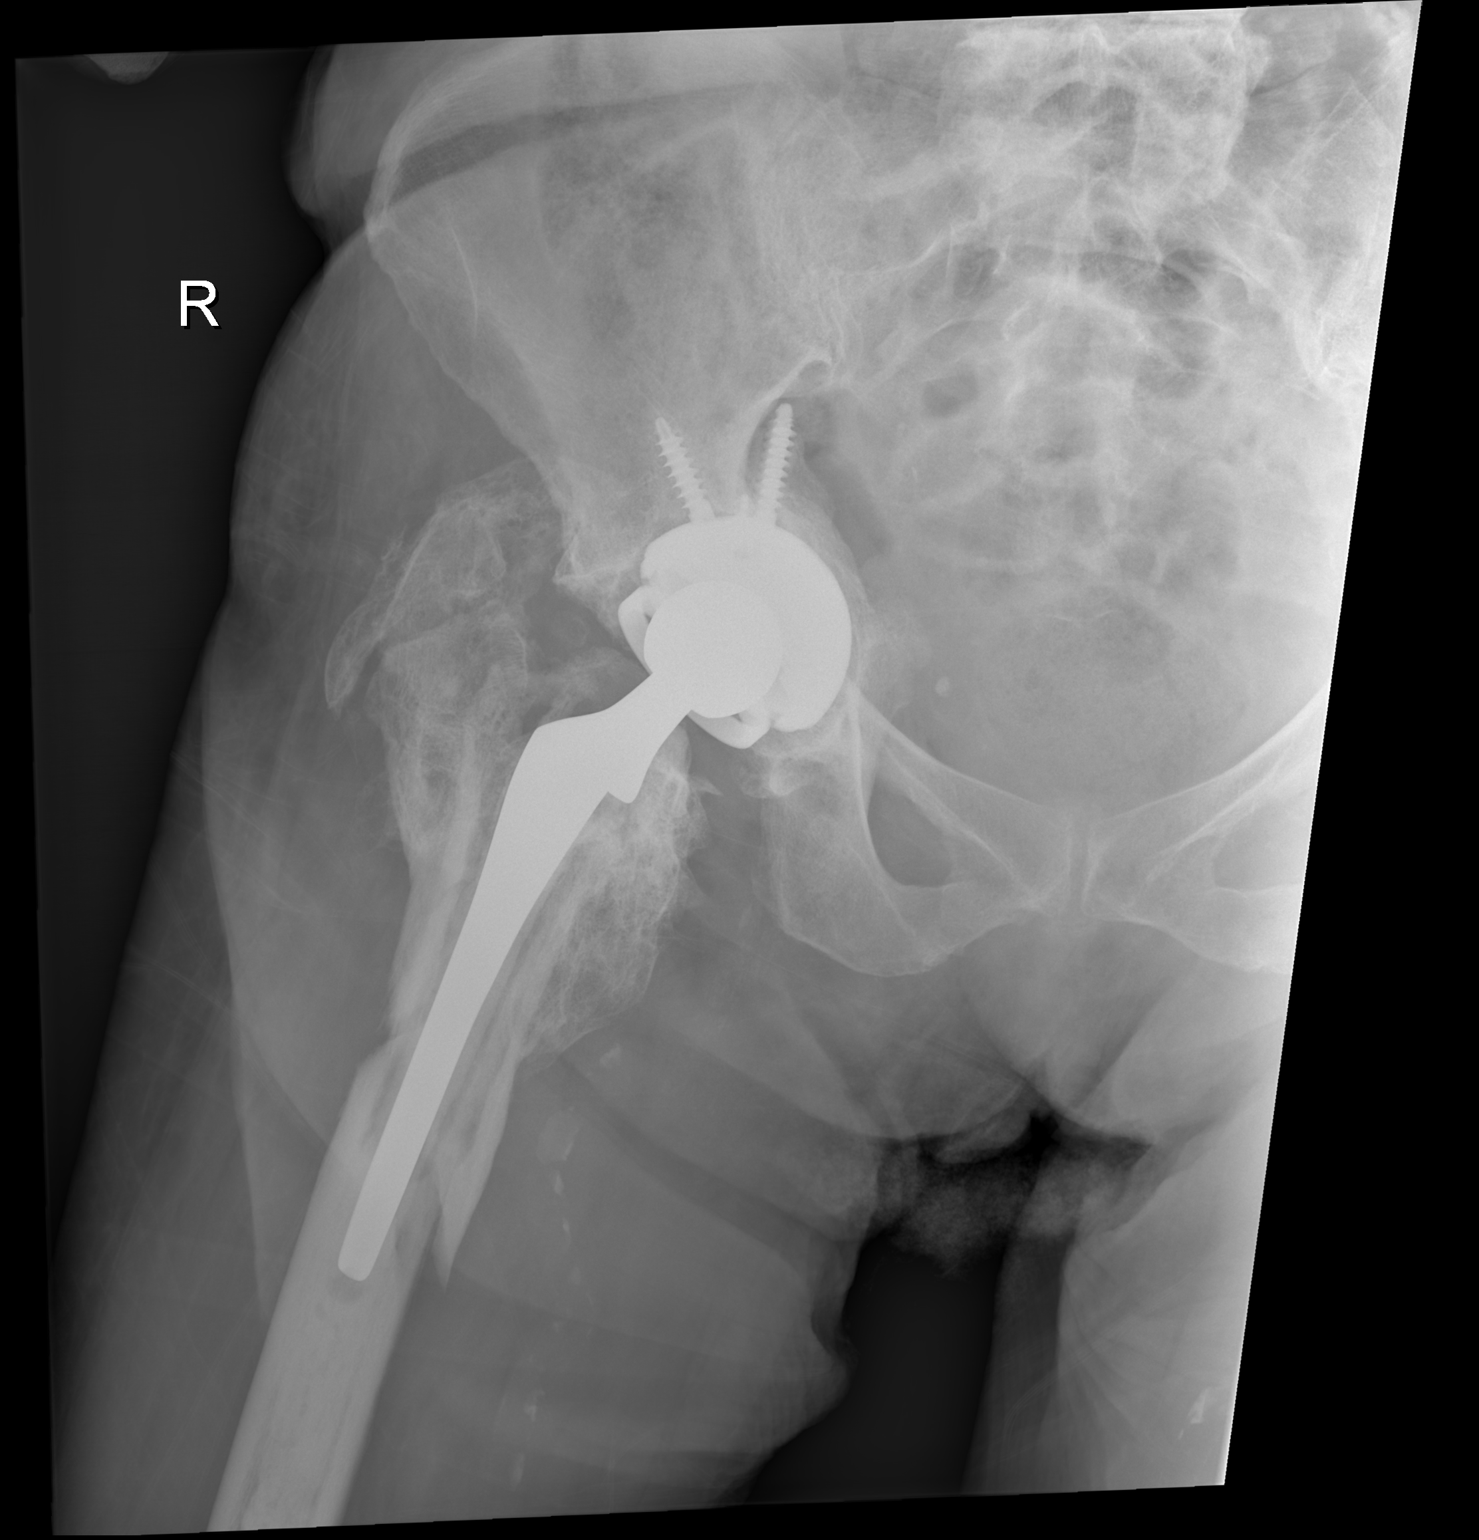

[3 of 3 positions shown; findings below may reference images not displayed]

FINDINGS: Right hip arthroplasty in expected alignment. Acute periprosthetic
fracture about the mid distal femoral shaft displacement of
approximately 6 mm of the medial cortex. Periprosthetic lucency
about the femoral stem is again seen. Chronic acetabula protrusio of
the acetabular cup. There is heterotopic ossification about the
lateral aspect of the right hip. Left hemiarthroplasty appears
intact. The previous left greater trochanteric fracture has healed.
Pubic rami appear intact.
IMPRESSION: 1. Right hip arthroplasty. Acute displaced periprosthetic fracture
about the mid to distal femoral stem.
2. Chronic right acetabular protrusio. Chronic periprosthetic
lucency about the femoral stem is again seen, suggesting loosening.
3. Left hip arthroplasty appears intact.

## 2020-10-05 ENCOUNTER — Telehealth: Payer: Self-pay | Admitting: Psychiatry

## 2020-10-05 NOTE — Telephone Encounter (Signed)
Please review

## 2020-10-05 NOTE — Telephone Encounter (Signed)
I have not seen her in quite a while.  Cannot Rx meds until seen.  Can put her on cancellation list but no guarantees.  Best option is schedule appt with Avelina Laine and I will consult on it.  Can they get her here?

## 2020-10-05 NOTE — Telephone Encounter (Signed)
Yolanda Baker's daughter Yolanda Baker called. Yolanda Baker would like to get an appt with Dr. Jennelle Human again asap. Feeling very nervous. She lives at [facility] facility. Last seen 2020. What can we do for her?

## 2020-10-06 NOTE — Telephone Encounter (Signed)
That's fine with me, how ambulatory is she? Will probably need 45 min with her.

## 2020-10-06 NOTE — Telephone Encounter (Signed)
She has age-related dementia and is wheelchair bound.  The history will come from the daughter.  Remotely she had a past history of psychosis and paranoia and took Geodon but I think I have weaned her off most of the of that and she has not had any paranoia or psychosis of which I am aware for many years.  Because you do not have to worry about falls if she remains wheelchair bound as expected you could use a simple benzo but if necessary other options like buspirone low-dose SSRI etc. she should be pretty easy

## 2020-10-06 NOTE — Telephone Encounter (Signed)
Understood, thanks

## 2020-10-07 NOTE — Telephone Encounter (Signed)
Appt is Thursday 8/18 with Avelina Laine.

## 2020-10-13 ENCOUNTER — Ambulatory Visit (INDEPENDENT_AMBULATORY_CARE_PROVIDER_SITE_OTHER): Payer: Medicare PPO | Admitting: Behavioral Health

## 2020-10-13 ENCOUNTER — Encounter: Payer: Self-pay | Admitting: Behavioral Health

## 2020-10-13 ENCOUNTER — Other Ambulatory Visit: Payer: Self-pay

## 2020-10-13 VITALS — BP 140/83 | HR 113 | Wt 105.0 lb

## 2020-10-13 DIAGNOSIS — F411 Generalized anxiety disorder: Secondary | ICD-10-CM | POA: Diagnosis not present

## 2020-10-13 NOTE — Progress Notes (Signed)
[facility] MD/PA/NP Initial Note  10/13/2020 5:56 PM Yolanda Baker  MRN:  [id number]  Chief Complaint:   HPI:   "Yolanda Baker", 87 year old female presents to this office for initial visit and to establish care. She is former patient of Dr. Meredith Baker who has not seen patient in several years. She is resident in [facility]. She says that she is in visit today because of her "nerve problems". Says that her nerves have been getting worse recently. Her daughter Yolanda Baker is present during interview. Pt is mostly quiet and struggle with speech.  Daughter provided medication list and reviewed prior hx. She says her anxiety and depression level is 6/10. She says that she believes her medication help her sleep and she is sleeping 7-8 hours per night. She understands that she is limited in psychiatric medications due to her advanced age and chronic health conditions. The daughter acknowledges previous history of stroke, atrial flutter and A fib. Daughter says she also has been diagnosed with age related dementia. She is also under palliative care. Pt says she just wants some help for her nerves. No mania or apparent psychosis present at this time. No auditory or visual hallucinations. No SI/HI.    Past Psychiatric Medication Trials: risperidone, Zoloft, citalopram 40, ziprasidone       Visit Diagnosis:    ICD-10-CM   1. Generalized anxiety disorder  F41.1       Past Psychiatric History: Schizoaffective disorder, anxiety, depression.   Past Medical History:  Past Medical History:  Diagnosis Date   Anemia    Anxiety    Arthritis    neck   Bruises easily    Chronic diastolic CHF (congestive heart failure) (HCC)    Chronic diastolic heart failure (HCC)    COPD (chronic obstructive pulmonary disease) (HCC)    Dementia (HCC)    Depression    Dysrhythmia    hx A-FLUTTER after hip surg April 2015- take metoprolol   Eczema    Falls    Hip fracture (HCC)    Hip joint pain     RT   Hypertension    Osteoporosis    Persistent atrial fibrillation (HCC) 08/04/2019   Pneumonia    august 2017   Stroke [facility])     Past Surgical History:  Procedure Laterality Date   ABDOMINAL HYSTERECTOMY     FRACTURE SURGERY Right 06/05/2013   hip   HIP ARTHROPLASTY Right 10/03/2013   Procedure: CONVERSION OF PREVIOUS RIGHT HIP SURGERY TO A RIGHT TOTAL HIP ARTHROPLASTY;  Surgeon: Yolanda Drilling, MD;  Location: [facility];  Service: Orthopedics;  Laterality: Right;   INTRAMEDULLARY (IM) NAIL INTERTROCHANTERIC Right 06/05/2013   Procedure: INTRAMEDULLARY (IM) NAIL INTERTROCHANTRIC;  Surgeon: Yolanda Drilling, MD;  Location: [facility];  Service: Orthopedics;  Laterality: Right;   JOINT REPLACEMENT     left hip   left hip Left 2008   LEG SURGERY Right    x2    Family Psychiatric History: see chart  Family History:  Family History  Problem Relation Age of Onset   Heart attack Father    Hypertension Father    Hypertension Sister    Stroke Sister    Hypertension Brother     Social History:  Social History   Socioeconomic History   Marital status: Widowed    Spouse name: Not on file   Number of children: 3   Years of education: 70yr colleg   Highest education level: Not on file  Occupational History   Occupation: Retired   Tobacco Use   Smoking status: Never   Smokeless tobacco: Never  Vaping Use   Vaping Use: Never used  Substance and Sexual Activity   Alcohol use: No    Alcohol/week: 0.0 standard drinks   Drug use: No   Sexual activity: Never  Other Topics Concern   Not on file  Social History Narrative   Drinks about 3 cups of coffee a day    Social Determinants of Health   Financial Resource Strain: Not on file  Food Insecurity: Not on file  Transportation Needs: Not on file  Physical Activity: Not on file  Stress: Not on file  Social Connections: Not on file    Allergies:  Allergies  Allergen Reactions   Flagyl [Metronidazole] Other (See Comments)     Fingers get "numb"    Metabolic Disorder Labs: Lab Results  Component Value Date   HGBA1C 5.9 (H) 06/30/2014   MPG 123 06/30/2014   No results found for: PROLACTIN Lab Results  Component Value Date   CHOL 148 06/30/2014   TRIG 43 06/30/2014   HDL 59 06/30/2014   CHOLHDL 2.5 06/30/2014   VLDL 9 06/30/2014   LDLCALC 80 06/30/2014   Lab Results  Component Value Date   TSH 14.929 (H) 11/27/2019   TSH 5.077 (H) 09/11/2019    Therapeutic Level Labs: No results found for: LITHIUM No results found for: VALPROATE No components found for:  CBMZ  Current Medications: Current Outpatient Medications  Medication Sig Dispense Refill   citalopram (CELEXA) 20 MG tablet Take 1 tablet (20 mg total) by mouth daily. 30 tablet 0   acetaminophen (TYLENOL) 500 MG tablet Take 500 mg by mouth 2 (two) times daily as needed.     apixaban (ELIQUIS) 2.5 MG TABS tablet Take 1 tablet (2.5 mg total) by mouth 2 (two) times daily. 60 tablet 2   chlorhexidine (PERIDEX) 0.12 % solution Use as directed 15 mLs in the mouth or throat 2 (two) times daily. Swish and spit     furosemide (LASIX) 40 MG tablet Take 1 tablet (40 mg total) by mouth daily. (Patient taking differently: Take 20 mg by mouth daily.) 30 tablet 0   galantamine (RAZADYNE ER) 8 MG 24 hr capsule Take 8 mg by mouth daily with breakfast.     hydrALAZINE (APRESOLINE) 50 MG tablet Take 50 mg by mouth 3 (three) times daily.     ipratropium-albuterol (DUONEB) 0.5-2.5 (3) MG/3ML SOLN Take 3 mLs by nebulization every 6 (six) hours as needed (shortness of breath).     loperamide (IMODIUM) 2 MG capsule Take 2 mg by mouth 3 (three) times daily as needed for diarrhea or loose stools.      metoprolol tartrate (LOPRESSOR) 25 MG tablet Take one tablet as needed for shortness of breath and heart rate greater than 110 beats per minute.  May take up to 50 mg in a 24 hour period. 180 tablet 3   Multiple Vitamins-Minerals (CERTAVITE SENIOR/ANTIOXIDANT PO) Take 1  tablet by mouth daily.      Polyethyl Glycol-Propyl Glycol (SYSTANE) 0.4-0.3 % SOLN Place 1 drop into both eyes every hour as needed (dry eyes/irritation).     potassium chloride SA (K-DUR,KLOR-CON) 20 MEQ tablet Take 20 mEq by mouth every other day.     psyllium (REGULOID) 0.52 g capsule Take 0.52 g by mouth daily.     Tiotropium Bromide-Olodaterol (STIOLTO RESPIMAT) 2.5-2.5 MCG/ACT AERS Inhale 2 puffs into the lungs daily.  ziprasidone (GEODON) 20 MG capsule TAKE ONE CAPSULE BY MOUTH DAILY IN THE AFTERNOON **DO NOT CRUSH** **NO REFILLS** (Patient taking differently: Take 20 mg by mouth daily at 2 PM.) 30 capsule 2   No current facility-administered medications for this visit.    Medication Side Effects: none  Orders placed this visit:  No orders of the defined types were placed in this encounter.   Psychiatric Specialty Exam:  Review of Systems  Constitutional: Negative.   Cardiovascular:  Negative for palpitations.  Allergic/Immunologic: Negative.   Neurological:  Negative for tremors and weakness.  Psychiatric/Behavioral:  Positive for dysphoric mood. The patient is nervous/anxious.    There were no vitals taken for this visit.There is no height or weight on file to calculate BMI.  General Appearance: Casual, Neat, and Well Groomed  Eye Contact:  Good  Speech:  Pressured, Slow, and  aphasia  Volume:  Decreased  Mood:  Anxious and Depressed  Affect:  Appropriate and Flat  Thought Process:  Coherent  Orientation:  Full (Time, Place, and Person)  Thought Content: Logical   Suicidal Thoughts:  No  Homicidal Thoughts:  No  Memory:  WNL  Judgement:  Fair  Insight:  Fair  Psychomotor Activity:  Decreased  Concentration:  Concentration: Fair  Recall:  Fair  Fund of Knowledge: Poor  Language: Good  Assets:  Desire for Improvement Physical Health Resilience Social Support  ADL's:  Impaired  Cognition: Impaired,  Moderate  Prognosis:  Poor   Screenings: none at this  time  Receiving Psychotherapy: No  Greater than 50% of 60 min face to face time with patient was spent on counseling and coordination of care. We discussed and planned the following:  Treatment Plan/Recommendations:  Continue Celexa 20 mg tablet daily Continue Geodon 20 mg daily at 2 pm To Start Buspar 5 mg twice daily for one week, then 10 mg twice daily.  Will report worsening symptoms promptly. To follow up with assisted living for further instructions with medication and RX      Joan Flores, NP

## 2020-10-18 ENCOUNTER — Emergency Department (HOSPITAL_COMMUNITY): Payer: Medicare PPO

## 2020-10-18 ENCOUNTER — Encounter (HOSPITAL_COMMUNITY): Payer: Self-pay

## 2020-10-18 ENCOUNTER — Other Ambulatory Visit: Payer: Self-pay

## 2020-10-18 ENCOUNTER — Emergency Department (HOSPITAL_COMMUNITY)
Admission: EM | Admit: 2020-10-18 | Discharge: 2020-10-18 | Disposition: A | Discharge status: Home / Self Care | Payer: Medicare PPO | Attending: Emergency Medicine | Admitting: Emergency Medicine

## 2020-10-18 DIAGNOSIS — Z7901 Long term (current) use of anticoagulants: Secondary | ICD-10-CM | POA: Diagnosis not present

## 2020-10-18 DIAGNOSIS — I110 Hypertensive heart disease with heart failure: Secondary | ICD-10-CM | POA: Diagnosis not present

## 2020-10-18 DIAGNOSIS — J449 Chronic obstructive pulmonary disease, unspecified: Secondary | ICD-10-CM | POA: Diagnosis not present

## 2020-10-18 DIAGNOSIS — I5032 Chronic diastolic (congestive) heart failure: Secondary | ICD-10-CM | POA: Diagnosis not present

## 2020-10-18 DIAGNOSIS — N12 Tubulo-interstitial nephritis, not specified as acute or chronic: Secondary | ICD-10-CM | POA: Diagnosis not present

## 2020-10-18 DIAGNOSIS — R109 Unspecified abdominal pain: Secondary | ICD-10-CM | POA: Diagnosis present

## 2020-10-18 LAB — CBC WITH DIFFERENTIAL/PLATELET
Abs Immature Granulocytes: 0.04 10*3/uL (ref 0.00–0.07)
Basophils Absolute: 0.1 10*3/uL (ref 0.0–0.1)
Basophils Relative: 1 %
Eosinophils Absolute: 0.1 10*3/uL (ref 0.0–0.5)
Eosinophils Relative: 1 %
HCT: 33.3 % — ABNORMAL LOW (ref 36.0–46.0)
Hemoglobin: 10.6 g/dL — ABNORMAL LOW (ref 12.0–15.0)
Immature Granulocytes: 1 %
Lymphocytes Relative: 20 %
Lymphs Abs: 1.4 10*3/uL (ref 0.7–4.0)
MCH: 31.5 pg (ref 26.0–34.0)
MCHC: 31.8 g/dL (ref 30.0–36.0)
MCV: 99.1 fL (ref 80.0–100.0)
Monocytes Absolute: 0.9 10*3/uL (ref 0.1–1.0)
Monocytes Relative: 12 %
Neutro Abs: 4.6 10*3/uL (ref 1.7–7.7)
Neutrophils Relative %: 65 %
Platelets: 282 10*3/uL (ref 150–400)
RBC: 3.36 MIL/uL — ABNORMAL LOW (ref 3.87–5.11)
RDW: 15.2 % (ref 11.5–15.5)
WBC: 7.1 10*3/uL (ref 4.0–10.5)
nRBC: 0 % (ref 0.0–0.2)

## 2020-10-18 LAB — COMPREHENSIVE METABOLIC PANEL
ALT: 10 U/L (ref 0–44)
AST: 23 U/L (ref 15–41)
Albumin: 3.5 g/dL (ref 3.5–5.0)
Alkaline Phosphatase: 66 U/L (ref 38–126)
Anion gap: 7 (ref 5–15)
BUN: 12 mg/dL (ref 8–23)
CO2: 28 mmol/L (ref 22–32)
Calcium: 9 mg/dL (ref 8.9–10.3)
Chloride: 91 mmol/L — ABNORMAL LOW (ref 98–111)
Creatinine, Ser: 0.97 mg/dL (ref 0.44–1.00)
GFR, Estimated: 54 mL/min — ABNORMAL LOW (ref >60)
Glucose, Bld: 108 mg/dL — ABNORMAL HIGH (ref 70–99)
Potassium: 4.4 mmol/L (ref 3.5–5.1)
Sodium: 126 mmol/L — ABNORMAL LOW (ref 135–145)
Total Bilirubin: 0.6 mg/dL (ref 0.3–1.2)
Total Protein: 7.2 g/dL (ref 6.5–8.1)

## 2020-10-18 LAB — URINALYSIS, ROUTINE W REFLEX MICROSCOPIC
Bilirubin Urine: NEGATIVE
Glucose, UA: NEGATIVE mg/dL
Hgb urine dipstick: NEGATIVE
Ketones, ur: NEGATIVE mg/dL
Nitrite: NEGATIVE
Protein, ur: NEGATIVE mg/dL
Specific Gravity, Urine: 1.009 (ref 1.005–1.030)
WBC, UA: >50 WBC/hpf — ABNORMAL HIGH (ref 0–5)
pH: 8 (ref 5.0–8.0)

## 2020-10-18 LAB — I-STAT CHEM 8, ED
BUN: 11 mg/dL (ref 8–23)
Calcium, Ion: 1.22 mmol/L (ref 1.15–1.40)
Chloride: 91 mmol/L — ABNORMAL LOW (ref 98–111)
Creatinine, Ser: 1.1 mg/dL — ABNORMAL HIGH (ref 0.44–1.00)
Glucose, Bld: 109 mg/dL — ABNORMAL HIGH (ref 70–99)
HCT: 34 % — ABNORMAL LOW (ref 36.0–46.0)
Hemoglobin: 11.6 g/dL — ABNORMAL LOW (ref 12.0–15.0)
Potassium: 4.3 mmol/L (ref 3.5–5.1)
Sodium: 128 mmol/L — ABNORMAL LOW (ref 135–145)
TCO2: 30 mmol/L (ref 22–32)

## 2020-10-18 LAB — LIPASE, BLOOD: Lipase: 36 U/L (ref 11–51)

## 2020-10-18 MED ORDER — CEPHALEXIN 500 MG PO CAPS: 500.0000 mg | ORAL_CAPSULE | Freq: Four times a day (QID) | ORAL | 0 refills | Status: DC

## 2020-10-18 MED ORDER — SODIUM CHLORIDE 0.9 % IV SOLN
1.0000 g | Freq: Once | INTRAVENOUS | Status: AC
  Administered 2020-10-18: 1 g via INTRAVENOUS
  Filled 2020-10-18: qty 10

## 2020-10-18 MED ORDER — ONDANSETRON 4 MG PO TBDP
4.0000 mg | ORAL_TABLET | Freq: Once | ORAL | Status: AC
  Administered 2020-10-18: 4 mg via ORAL
  Filled 2020-10-18: qty 1

## 2020-10-18 MED ORDER — IOHEXOL 350 MG/ML SOLN
65.0000 mL | Freq: Once | INTRAVENOUS | Status: AC | PRN
  Administered 2020-10-18: 65 mL via INTRAVENOUS

## 2020-10-18 MED ORDER — MORPHINE SULFATE (PF) 2 MG/ML IV SOLN
2.0000 mg | Freq: Once | INTRAVENOUS | Status: AC
  Administered 2020-10-18: 2 mg via INTRAVENOUS
  Filled 2020-10-18: qty 1

## 2020-10-18 MED ORDER — METOPROLOL TARTRATE 25 MG PO TABS
25.0000 mg | ORAL_TABLET | Freq: Once | ORAL | Status: AC
  Administered 2020-10-18: 25 mg via ORAL
  Filled 2020-10-18: qty 1

## 2020-10-18 MED ORDER — SODIUM CHLORIDE 0.9 % IV BOLUS
1000.0000 mL | Freq: Once | INTRAVENOUS | Status: AC
  Administered 2020-10-18: 1000 mL via INTRAVENOUS

## 2020-10-18 NOTE — ED Notes (Signed)
Pt transported to CT ?

## 2020-10-18 NOTE — ED Triage Notes (Signed)
Patient coming from [facility] with c/o abdominal pain for about a week. Patient had x-ray at yesterday 10/17/20 showing mild to moderate ileus.

## 2020-10-18 NOTE — ED Provider Notes (Signed)
[facility]-EMERGENCY DEPT Provider Note   CSN: [id number] Arrival date & time: 10/18/20  2458     History No chief complaint on file.   Yolanda Baker is a 87 y.o. female.  87 yo F with a chief complaints of abdominal pain.  Patient is demented and has a history of mental illness she is unsure exactly how long its been going on for.  She is not sure exactly where it hurts.  Level 5 caveat dementia.  Per the nursing facility she is been having symptoms for about a week.  Had an x-ray done that showed a ileus and there was some concern for possible urinary tract infection.  I asked patient if she had any urinary symptoms and she was not sure.  The history is provided by the patient.  Illness Severity:  Moderate Onset quality:  Gradual Duration:  1 week Timing:  Constant Progression:  Worsening Chronicity:  New Associated symptoms: abdominal pain   Associated symptoms: no chest pain, no congestion, no fever, no headaches, no myalgias, no nausea, no rhinorrhea, no shortness of breath, no vomiting and no wheezing       Past Medical History:  Diagnosis Date   Anemia    Anxiety    Arthritis    neck   Bruises easily    Chronic diastolic CHF (congestive heart failure) (HCC)    Chronic diastolic heart failure (HCC)    COPD (chronic obstructive pulmonary disease) (HCC)    Dementia (HCC)    Depression    Dysrhythmia    hx A-FLUTTER after hip surg April 2015- take metoprolol   Eczema    Falls    Hip fracture (HCC)    Hip joint pain    RT   Hypertension    Osteoporosis    Persistent atrial fibrillation (HCC) 08/04/2019   Pneumonia    august 2017   Stroke [facility])     Patient Active Problem List   Diagnosis Date Noted   Bradycardia 11/27/2019   Hypoxia    Acute respiratory failure with hypoxia (HCC) 10/16/2019   Persistent atrial fibrillation (HCC) 09/25/2019   Secondary hypercoagulable state (HCC) 09/25/2019   Atrial fibrillation with RVR (HCC)  09/11/2019   Goals of care, counseling/discussion    Palliative care by specialist    Pressure injury of skin 05/07/2019   Periprosthetic fracture around internal prosthetic hip joint 05/06/2019   DNR (do not resuscitate) present on admission 02/04/2019   Chronic diastolic heart failure (HCC)    GAD (generalized anxiety disorder) 01/15/2018   Schizoaffective disorder (HCC) 01/15/2018   Hyperlipidemia LDL goal <70 07/01/2014   Constipation chronic 07/01/2014   Acute CVA (cerebrovascular accident) (HCC)    Acute blood loss anemia 10/12/2013   Postoperative anemia due to acute blood loss 10/04/2013   Postop Transfusion 10/04/2013   Closed fracture of femur with nonunion 10/03/2013   Femur fracture, right (HCC) 10/03/2013   Closed femur fracture (HCC) 10/03/2013   UTI (urinary tract infection) 06/23/2013   Dementia (HCC) 06/09/2013   Anxiety 06/09/2013   Atrial flutter (HCC) 06/07/2013   Hip fracture (HCC) 06/04/2013   Depression 06/04/2013   Anxiety state 06/04/2013   Essential hypertension 06/04/2013   Closed right hip fracture (HCC) 06/04/2013    Past Surgical History:  Procedure Laterality Date   ABDOMINAL HYSTERECTOMY     FRACTURE SURGERY Right 06/05/2013   hip   HIP ARTHROPLASTY Right 10/03/2013   Procedure: CONVERSION OF PREVIOUS RIGHT HIP SURGERY TO A  RIGHT TOTAL HIP ARTHROPLASTY;  Surgeon: Loanne Drilling, MD;  Location: [facility];  Service: Orthopedics;  Laterality: Right;   INTRAMEDULLARY (IM) NAIL INTERTROCHANTERIC Right 06/05/2013   Procedure: INTRAMEDULLARY (IM) NAIL INTERTROCHANTRIC;  Surgeon: Loanne Drilling, MD;  Location: [facility];  Service: Orthopedics;  Laterality: Right;   JOINT REPLACEMENT     left hip   left hip Left 2008   LEG SURGERY Right    x2     OB History   No obstetric history on file.     Family History  Problem Relation Age of Onset   Heart attack Father    Hypertension Father    Hypertension Sister    Stroke Sister    Hypertension Brother      Social History   Tobacco Use   Smoking status: Never   Smokeless tobacco: Never  Vaping Use   Vaping Use: Never used  Substance Use Topics   Alcohol use: No    Alcohol/week: 0.0 standard drinks   Drug use: No    Home Medications Prior to Admission medications   Medication Sig Start Date End Date Taking? Authorizing Provider  acetaminophen (TYLENOL) 500 MG tablet Take 1,000 mg by mouth in the morning, at noon, and at bedtime.   Yes [provider]  apixaban (ELIQUIS) 2.5 MG TABS tablet Take 1 tablet (2.5 mg total) by mouth 2 (two) times daily. 07/01/14  Yes Layne Benton, NP  busPIRone (BUSPAR) 5 MG tablet Take 5 mg by mouth 2 (two) times daily.   Yes [provider]  Calcium Carb-Cholecalciferol 600-100 MG-UNIT CAPS Take 1 tablet by mouth in the morning and at bedtime.   Yes [provider]  cephALEXin (KEFLEX) 500 MG capsule Take 1 capsule (500 mg total) by mouth 4 (four) times daily. 10/18/20  Yes Melene Plan, DO  chlorhexidine (PERIDEX) 0.12 % solution Use as directed 15 mLs in the mouth or throat 2 (two) times daily. Swish and spit   Yes [provider]  citalopram (CELEXA) 20 MG tablet Take 1 tablet (20 mg total) by mouth daily. 09/15/19  Yes Swayze, Ava, DO  furosemide (LASIX) 20 MG tablet Take 20 mg by mouth daily.   Yes [provider]  galantamine (RAZADYNE ER) 8 MG 24 hr capsule Take 8 mg by mouth daily with breakfast.   Yes [provider]  hydrALAZINE (APRESOLINE) 50 MG tablet Take 50 mg by mouth 3 (three) times daily.   Yes [provider]  ipratropium-albuterol (DUONEB) 0.5-2.5 (3) MG/3ML SOLN Take 3 mLs by nebulization every 6 (six) hours as needed (shortness of breath).   Yes [provider]  loperamide (IMODIUM) 2 MG capsule Take 2 mg by mouth 3 (three) times daily as needed for diarrhea or loose stools.  07/14/19  Yes [provider]  metoprolol tartrate (LOPRESSOR) 25 MG tablet Take one  tablet as needed for shortness of breath and heart rate greater than 110 beats per minute.  May take up to 50 mg in a 24 hour period. 01/19/20  Yes Marinus Maw, MD  Multiple Vitamins-Minerals (CERTAVITE SENIOR/ANTIOXIDANT PO) Take 1 tablet by mouth daily.    Yes [provider]  omeprazole (PRILOSEC) 20 MG capsule Take 20 mg by mouth daily.   Yes [provider]  Ostomy Supplies (SKIN PREP WIPES) MISC Place 1 application onto the skin in the morning and at bedtime. To skin on outer ankle   Yes [provider]  Polyethyl Glycol-Propyl Glycol (  SYSTANE) 0.4-0.3 % SOLN Place 1 drop into both eyes in the morning and at bedtime. May use an additional 1 drop in each eye every hour as needed for irritation or dryness.   Yes [provider]  polyethylene glycol (MIRALAX / GLYCOLAX) 17 g packet Take 17 g by mouth every other day. May also take an additional 17gm once daily as needed for constipation   Yes [provider]  potassium chloride SA (K-DUR,KLOR-CON) 20 MEQ tablet Take 20 mEq by mouth every other day.   Yes [provider]  psyllium (REGULOID) 0.52 g capsule Take 0.52 g by mouth daily.   Yes [provider]  sodium chloride (OCEAN) 0.65 % SOLN nasal spray Place 2 sprays into both nostrils daily.   Yes [provider]  ziprasidone (GEODON) 20 MG capsule TAKE ONE CAPSULE BY MOUTH DAILY IN THE AFTERNOON **DO NOT CRUSH** **NO REFILLS** Patient taking differently: Take 20 mg by mouth daily at 2 PM. 09/10/19  Yes Cottle, Steva Ready., MD    Allergies    Flagyl [metronidazole]  Review of Systems   Review of Systems  Constitutional:  Negative for chills and fever.  HENT:  Negative for congestion and rhinorrhea.   Eyes:  Negative for redness and visual disturbance.  Respiratory:  Negative for shortness of breath and wheezing.   Cardiovascular:  Negative for chest pain and palpitations.  Gastrointestinal:  Positive for abdominal  pain. Negative for nausea and vomiting.  Genitourinary:  Negative for dysuria and urgency.  Musculoskeletal:  Negative for arthralgias and myalgias.  Skin:  Negative for pallor and wound.  Neurological:  Negative for dizziness and headaches.   Physical Exam Updated Vital Signs BP (!) 148/137   Pulse 77   Temp 98.3 F (36.8 C) (Oral)   Resp 14   SpO2 93%   Physical Exam Vitals and nursing note reviewed.  Constitutional:      General: She is not in acute distress.    Appearance: She is well-developed. She is not diaphoretic.  HENT:     Head: Normocephalic and atraumatic.  Eyes:     Pupils: Pupils are equal, round, and reactive to light.  Cardiovascular:     Rate and Rhythm: Normal rate and regular rhythm.     Heart sounds: No murmur heard.   No friction rub. No gallop.  Pulmonary:     Effort: Pulmonary effort is normal.     Breath sounds: No wheezing or rales.  Abdominal:     General: There is no distension.     Palpations: Abdomen is soft.     Tenderness: There is no abdominal tenderness.  Musculoskeletal:        General: No tenderness.     Cervical back: Normal range of motion and neck supple.  Skin:    General: Skin is warm and dry.  Neurological:     Mental Status: She is alert and oriented to person, place, and time.  Psychiatric:        Behavior: Behavior normal.    ED Results / Procedures / Treatments   Labs (all labs ordered are listed, but only abnormal results are displayed) Labs Reviewed  CBC WITH DIFFERENTIAL/PLATELET - Abnormal; Notable for the following components:      Result Value   RBC 3.36 (*)    Hemoglobin 10.6 (*)    HCT 33.3 (*)    All other components within normal limits  COMPREHENSIVE METABOLIC PANEL - Abnormal; Notable for the following components:  Sodium 126 (*)    Chloride 91 (*)    Glucose, Bld 108 (*)    GFR, Estimated 54 (*)    All other components within normal limits  URINALYSIS, ROUTINE W REFLEX MICROSCOPIC - Abnormal;  Notable for the following components:   APPearance HAZY (*)    Leukocytes,Ua SMALL (*)    WBC, UA >50 (*)    Bacteria, UA RARE (*)    All other components within normal limits  I-STAT CHEM 8, ED - Abnormal; Notable for the following components:   Sodium 128 (*)    Chloride 91 (*)    Creatinine, Ser 1.10 (*)    Glucose, Bld 109 (*)    Hemoglobin 11.6 (*)    HCT 34.0 (*)    All other components within normal limits  URINE CULTURE  LIPASE, BLOOD    EKG None  Radiology CT ABDOMEN PELVIS W CONTRAST  Result Date: 10/18/2020 CLINICAL DATA:  Abdominal pain and distention for 1 week. EXAM: CT ABDOMEN AND PELVIS WITH CONTRAST TECHNIQUE: Multidetector CT imaging of the abdomen and pelvis was performed using the standard protocol following bolus administration of intravenous contrast. CONTRAST:  23mL OMNIPAQUE IOHEXOL 350 MG/ML SOLN COMPARISON:  None. FINDINGS: Lower Chest: Small left pleural effusion noted as well as mild dependent left basilar atelectasis. Hepatobiliary: No hepatic masses identified. Gallbladder is unremarkable. No evidence of biliary ductal dilatation. Pancreas: 10 mm simple appearing cyst is seen in the pancreatic head on image 26/2. This shows communication with the main pancreatic duct. There is no evidence of pancreatic ductal dilatation or peripancreatic inflammatory changes. Spleen: Within normal limits in size and appearance. Adrenals/Urinary Tract: No masses identified. Several ill-defined areas of decreased parenchymal enhancement are seen in right kidney, suspicious for pyelonephritis. No evidence of renal abscess. No evidence of ureteral calculi or hydronephrosis. Bladder is not well visualized due to severe artifact from bilateral hip prostheses. Stomach/Bowel: No evidence of obstruction, inflammatory process or abnormal fluid collections. Diverticulosis is seen mainly involving the sigmoid colon, however there is no evidence of diverticulitis. Vascular/Lymphatic: No  pathologically enlarged lymph nodes. No acute vascular findings. Aortic atherosclerotic calcification noted. Reproductive: Not visualized due to severe artifact from bilateral hip prostheses. Other:  None. Musculoskeletal: No suspicious bone lesions identified. Bilateral hip arthroplasties noted. IMPRESSION: Several ill-defined areas of decreased parenchymal enhancement in right kidney, suspicious for pyelonephritis. No evidence of renal abscess, ureteral calculi, or hydronephrosis. 10 mm simple appearing cystic lesion in pancreatic head. This likely represents an indolent cystic neoplasm such as a side-branch IPMN. In a patient of this age, continued follow-up by abdomen CT or MRI is recommended in 2 years. This recommendation follows ACR consensus guidelines: Management of Incidental Pancreatic Cysts: A White Paper of the ACR Incidental Findings Committee. J Am Coll Radiol 2017;14:911-923. Colonic diverticulosis, without radiographic evidence of diverticulitis. Small left pleural effusion and mild left basilar atelectasis. Aortic Atherosclerosis (ICD10-I70.0). Electronically Signed   By: Danae Orleans M.D.   On: 10/18/2020 12:54    Procedures Procedures   Medications Ordered in ED Medications  cefTRIAXone (ROCEPHIN) 1 g in sodium chloride 0.9 % 100 mL IVPB (has no administration in time range)  sodium chloride 0.9 % bolus 1,000 mL (0 mLs Intravenous Stopped 10/18/20 1237)  morphine 2 MG/ML injection 2 mg (2 mg Intravenous Given 10/18/20 1059)  ondansetron (ZOFRAN-ODT) disintegrating tablet 4 mg (4 mg Oral Given 10/18/20 1050)  iohexol (OMNIPAQUE) 350 MG/ML injection 65 mL (65 mLs Intravenous Contrast Given 10/18/20 1159)  ED Course  I have reviewed the triage vital signs and the nursing notes.  Pertinent labs & imaging results that were available during my care of the patient were reviewed by me and considered in my medical decision making (see chart for details).    MDM Rules/Calculators/A&P                            87 yo F with a chief complaints of abdominal pain.  Patient has trouble describing this room tell me she has pain currently.  Report is been going on for a week.  Sent by her nursing facility with concern for small bowel obstruction though the radiology report of the plain x-ray is read as likely ileus and not bowel obstruction.  There is also comment on the x-ray read of possible urinary tract infection.  She is denying any urinary symptoms but she is also not sure if she is having urinary symptoms.  We will obtain a laboratory evaluation UA treat pain and nausea and CT scan of the abdomen pelvis to further evaluate.  CT scan with possible pyelonephritis.  UA without obvious signs of infection but with imaging consistent and patient demented difficult to ascertain her symptoms will start on antibiotics.  Have her follow-up with her family doctor.  I discussed with her the incidental finding of the pancreatic cyst.  1:47 PM:  I have discussed the diagnosis/risks/treatment options with the patient and family and believe the pt to be eligible for discharge home to follow-up with PCP. We also discussed returning to the ED immediately if new or worsening sx occur. We discussed the sx which are most concerning (e.g., sudden worsening pain, fever, inability to tolerate by mouth ) that necessitate immediate return. Medications administered to the patient during their visit and any new prescriptions provided to the patient are listed below.  Medications given during this visit Medications  cefTRIAXone (ROCEPHIN) 1 g in sodium chloride 0.9 % 100 mL IVPB (has no administration in time range)  sodium chloride 0.9 % bolus 1,000 mL (0 mLs Intravenous Stopped 10/18/20 1237)  morphine 2 MG/ML injection 2 mg (2 mg Intravenous Given 10/18/20 1059)  ondansetron (ZOFRAN-ODT) disintegrating tablet 4 mg (4 mg Oral Given 10/18/20 1050)  iohexol (OMNIPAQUE) 350 MG/ML injection 65 mL (65 mLs Intravenous  Contrast Given 10/18/20 1159)     The patient appears reasonably screen and/or stabilized for discharge and I doubt any other medical condition or other [facility] requiring further screening, evaluation, or treatment in the ED at this time prior to discharge.   Final Clinical Impression(s) / ED Diagnoses Final diagnoses:  Pyelonephritis    Rx / DC Orders ED Discharge Orders          Ordered    cephALEXin (KEFLEX) 500 MG capsule  4 times daily        10/18/20 1345             Melene PlanFloyd, Bhavika Schnider, DO 10/18/20 1347

## 2020-10-18 NOTE — ED Notes (Signed)
This RN notified EDP [place] of pt VS.

## 2020-10-18 NOTE — ED Notes (Signed)
Transport here to pick up patient.

## 2020-10-18 NOTE — Discharge Instructions (Addendum)
Your CT scan showed that you might have an infection in your kidney.  Please take the antibiotics as prescribed.  The CT scan also showed that you have a cyst on your pancreas.  They recommended repeat imaging in 2 years time.  Please discuss this with your family doctor and see what they recommend.  Please return for worsening pain fever or inability to eat or drink.

## 2020-10-18 NOTE — ED Notes (Signed)
[facility] on unit to transfer pt back to facility. No s/s of acute distress noted.

## 2020-10-18 NOTE — ED Notes (Signed)
[facility] called for transport.  

## 2020-10-20 LAB — URINE CULTURE: Culture: >=100000 — AB

## 2020-10-21 ENCOUNTER — Telehealth: Payer: Self-pay | Admitting: Emergency Medicine

## 2020-10-21 NOTE — Telephone Encounter (Signed)
Post ED Visit - Positive Culture Follow-up  Culture report reviewed by antimicrobial stewardship pharmacist: [facility] []  , Pharm.D. []  Enzo Bi, Pharm.D., BCPS AQ-ID []  , Pharm.D., BCPS []  Celedonio Miyamoto, Pharm.D., BCPS []  [place], Garvin Fila.D., BCPS, AAHIVP []  , Pharm.D., BCPS, AAHIVP []  Georgina Pillion, PharmD, BCPS []  , PharmD, BCPS []  [place], PharmD, BCPS []  [address], PharmD []  , PharmD, BCPS []  Estella Husk, PharmD  Pharmacy Team []  Lysle Pearl, PharmD []  , PharmD []  Phillips Climes, PharmD []  , Rph []  Agapito Games) , PharmD []  Verlan Friends, PharmD []  , PharmD [x]  Mervyn Gay, PharmD []  , PharmD []  Vinnie Level, PharmD []  [facility], PharmD []  , PharmD []  Len Childs, PharmD   Positive urine culture Treated with Cephalexin, organism sensitive to the same and no further patient follow-up is required at this time.  Leonides Minder 10/21/2020, 10:27 AM

## 2020-11-10 ENCOUNTER — Encounter (HOSPITAL_BASED_OUTPATIENT_CLINIC_OR_DEPARTMENT_OTHER): Payer: Self-pay

## 2020-11-10 MED ORDER — METOPROLOL TARTRATE 25 MG PO TABS: 12.5000 mg | ORAL_TABLET | Freq: Two times a day (BID) | ORAL | 3 refills | Status: DC

## 2020-11-14 MED ORDER — METOPROLOL TARTRATE 25 MG PO TABS: 12.5000 mg | ORAL_TABLET | Freq: Two times a day (BID) | ORAL | 3 refills | Status: DC

## 2020-11-24 ENCOUNTER — Other Ambulatory Visit: Payer: Self-pay

## 2020-11-24 ENCOUNTER — Ambulatory Visit (INDEPENDENT_AMBULATORY_CARE_PROVIDER_SITE_OTHER): Payer: Medicare PPO | Admitting: Behavioral Health

## 2020-11-24 ENCOUNTER — Encounter: Payer: Self-pay | Admitting: Behavioral Health

## 2020-11-24 DIAGNOSIS — F411 Generalized anxiety disorder: Secondary | ICD-10-CM

## 2020-11-24 NOTE — Progress Notes (Signed)
[facility]  Patient ID: Yolanda Baker,  MRN: [id number]  PCP: Yolanda Ran, MD  Date of Evaluation: 11/24/2020 Time spent:30 minutes  Chief Complaint:  Chief Complaint   Anxiety; Follow-up; Medication Refill     HISTORY/CURRENT STATUS: HPI 87 year old female presents to this office for follow up and medication management. She is former patient of Dr. Meredith Baker. Her daughter Yolanda Baker is present during interview with consent. Says that her anxiety and "nervousness" has greatly improved. She had nervous trembling in both hands which now has almost completely stopped. She continues with palliative care as needed. Says her anxiety today is 2/10 and her depression is 2/10. Sleeping 7 hours per night. No mania, no psychosis or delirium. No SI/HI. Wishes to follow up in 6 months for reassessment.   Past Psychiatric Medication Trials: risperidone, Zoloft, citalopram 40, ziprasidone     Individual Medical History/ Review of Systems: Changes? :No   Allergies: Flagyl [metronidazole]  Current Medications:  Current Outpatient Medications:    acetaminophen (TYLENOL) 500 MG tablet, Take 1,000 mg by mouth in the morning, at noon, and at bedtime., Disp: , Rfl:    apixaban (ELIQUIS) 2.5 MG TABS tablet, Take 1 tablet (2.5 mg total) by mouth 2 (two) times daily., Disp: 60 tablet, Rfl: 2   busPIRone (BUSPAR) 5 MG tablet, Take 5 mg by mouth 2 (two) times daily., Disp: , Rfl:    Calcium Carb-Cholecalciferol 600-100 MG-UNIT CAPS, Take 1 tablet by mouth in the morning and at bedtime., Disp: , Rfl:    cephALEXin (KEFLEX) 500 MG capsule, Take 1 capsule (500 mg total) by mouth 4 (four) times daily., Disp: 20 capsule, Rfl: 0   chlorhexidine (PERIDEX) 0.12 % solution, Use as directed 15 mLs in the mouth or throat 2 (two) times daily. Swish and spit, Disp: , Rfl:    citalopram (CELEXA) 20 MG tablet, Take 1 tablet (20 mg total) by mouth daily., Disp: 30 tablet, Rfl: 0   furosemide (LASIX) 20 MG  tablet, Take 20 mg by mouth daily., Disp: , Rfl:    galantamine (RAZADYNE ER) 8 MG 24 hr capsule, Take 8 mg by mouth daily with breakfast., Disp: , Rfl:    hydrALAZINE (APRESOLINE) 50 MG tablet, Take 50 mg by mouth 3 (three) times daily., Disp: , Rfl:    ipratropium-albuterol (DUONEB) 0.5-2.5 (3) MG/3ML SOLN, Take 3 mLs by nebulization every 6 (six) hours as needed (shortness of breath)., Disp: , Rfl:    loperamide (IMODIUM) 2 MG capsule, Take 2 mg by mouth 3 (three) times daily as needed for diarrhea or loose stools. , Disp: , Rfl:    metoprolol tartrate (LOPRESSOR) 25 MG tablet, Take 0.5 tablets (12.5 mg total) by mouth 2 (two) times daily., Disp: 180 tablet, Rfl: 3   Multiple Vitamins-Minerals (CERTAVITE SENIOR/ANTIOXIDANT PO), Take 1 tablet by mouth daily. , Disp: , Rfl:    omeprazole (PRILOSEC) 20 MG capsule, Take 20 mg by mouth daily., Disp: , Rfl:    Ostomy Supplies (SKIN PREP WIPES) MISC, Place 1 application onto the skin in the morning and at bedtime. To skin on outer ankle, Disp: , Rfl:    Polyethyl Glycol-Propyl Glycol (SYSTANE) 0.4-0.3 % SOLN, Place 1 drop into both eyes in the morning and at bedtime. May use an additional 1 drop in each eye every hour as needed for irritation or dryness., Disp: , Rfl:    polyethylene glycol (MIRALAX / GLYCOLAX) 17 g packet, Take 17 g by mouth every other day.  May also take an additional 17gm once daily as needed for constipation, Disp: , Rfl:    potassium chloride SA (K-DUR,KLOR-CON) 20 MEQ tablet, Take 20 mEq by mouth every other day., Disp: , Rfl:    psyllium (REGULOID) 0.52 g capsule, Take 0.52 g by mouth daily., Disp: , Rfl:    sodium chloride (OCEAN) 0.65 % SOLN nasal spray, Place 2 sprays into both nostrils daily., Disp: , Rfl:    ziprasidone (GEODON) 20 MG capsule, TAKE ONE CAPSULE BY MOUTH DAILY IN THE AFTERNOON **DO NOT CRUSH** **NO REFILLS** (Patient taking differently: Take 20 mg by mouth daily at 2 PM.), Disp: 30 capsule, Rfl: 2 Medication  Side Effects: none  Family Medical/ Social History: Changes? No  MENTAL HEALTH EXAM:  There were no vitals taken for this visit.There is no height or weight on file to calculate BMI.  General Appearance: Casual, Neat, and Well Groomed  Eye Contact:  Good  Speech:  Clear and Coherent  Volume:  Normal  Mood:  NA  Affect:  Appropriate  Thought Process:  Coherent  Orientation:  Full (Time, Place, and Person)  Thought Content: Logical   Suicidal Thoughts:  No  Homicidal Thoughts:  No  Memory:  WNL  Judgement:  Fair  Insight:  Fair  Psychomotor Activity:  Decreased  Concentration:  Concentration: Fair  Recall:  Fair  Fund of Knowledge: Fair  Language: Good  Assets:  Desire for Improvement Physical Health Resilience Social Support Transportation  ADL's:  Impaired  Cognition: WNL  Prognosis:  Fair    DIAGNOSES:    ICD-10-CM   1. Generalized anxiety disorder  F41.1       Receiving Psychotherapy: No    RECOMMENDATIONS:   Greater than 50% of 30 min face to face time with patient was spent on counseling and coordination of care. We discussed and planned the following:   Treatment Plan/Recommendations:  Continue Celexa 20 mg tablet daily Continue Geodon 20 mg daily at 2 pm Continue Buspar 10 mg twice daily Will report worsening symptoms promptly. Filled out documentation required by assisted living for visit The will continue to handle refills as needed.     Yolanda Flores, NP

## 2020-11-24 NOTE — Telephone Encounter (Signed)
See other mychart message 9/15

## 2021-01-03 ENCOUNTER — Telehealth: Payer: Self-pay | Admitting: Cardiovascular Disease

## 2021-01-03 NOTE — Telephone Encounter (Signed)
Left message to call back  

## 2021-01-03 NOTE — Telephone Encounter (Signed)
Eman called and wanted to talk with Dr. Duke Salvia or her nurse regarding the patient.

## 2021-01-10 NOTE — Telephone Encounter (Signed)
Mailbox full, unable to leave message

## 2021-01-17 NOTE — Telephone Encounter (Signed)
I spoke with the nurse, Eman at [facility]. She states they have an order for 12.5 mg metoprolol BID and another order for a prn dose metoprolol 12.5 mg prn for HR >110.  She would like an order to specify that the HR be checked midday (noon).  Otherwise the med techs are checking the HR too close to the morning dose of metoprolol.  She specifically asked for a signed order so we should write on prescription and fax to [phone].    Will route to Dr. Duke Salvia and her nurse to confirm PRN dose is ordered and if so will add to her med list and fax the note about checking HR at noon.

## 2021-01-17 NOTE — Telephone Encounter (Signed)
Eman from [facility] calling back. She says they have orders to check the patient's pulse and metoprolol, but they are scheduled at the same time and want to make sure this is okay. She says it is very important she speak with someone soon. She says to first try: [phone], but if there is no answer to call: [phone]

## 2021-02-10 IMAGING — DX DG FEMUR 2+V PORT*R*
4 series · 4 of 4 positions shown · non-contrast
Comparison: 09/10/2019

CLINICAL DATA: History of prior femoral fracture

EXAM:
RIGHT FEMUR PORTABLE 2 VIEW

[femur ap (1 of 2)]
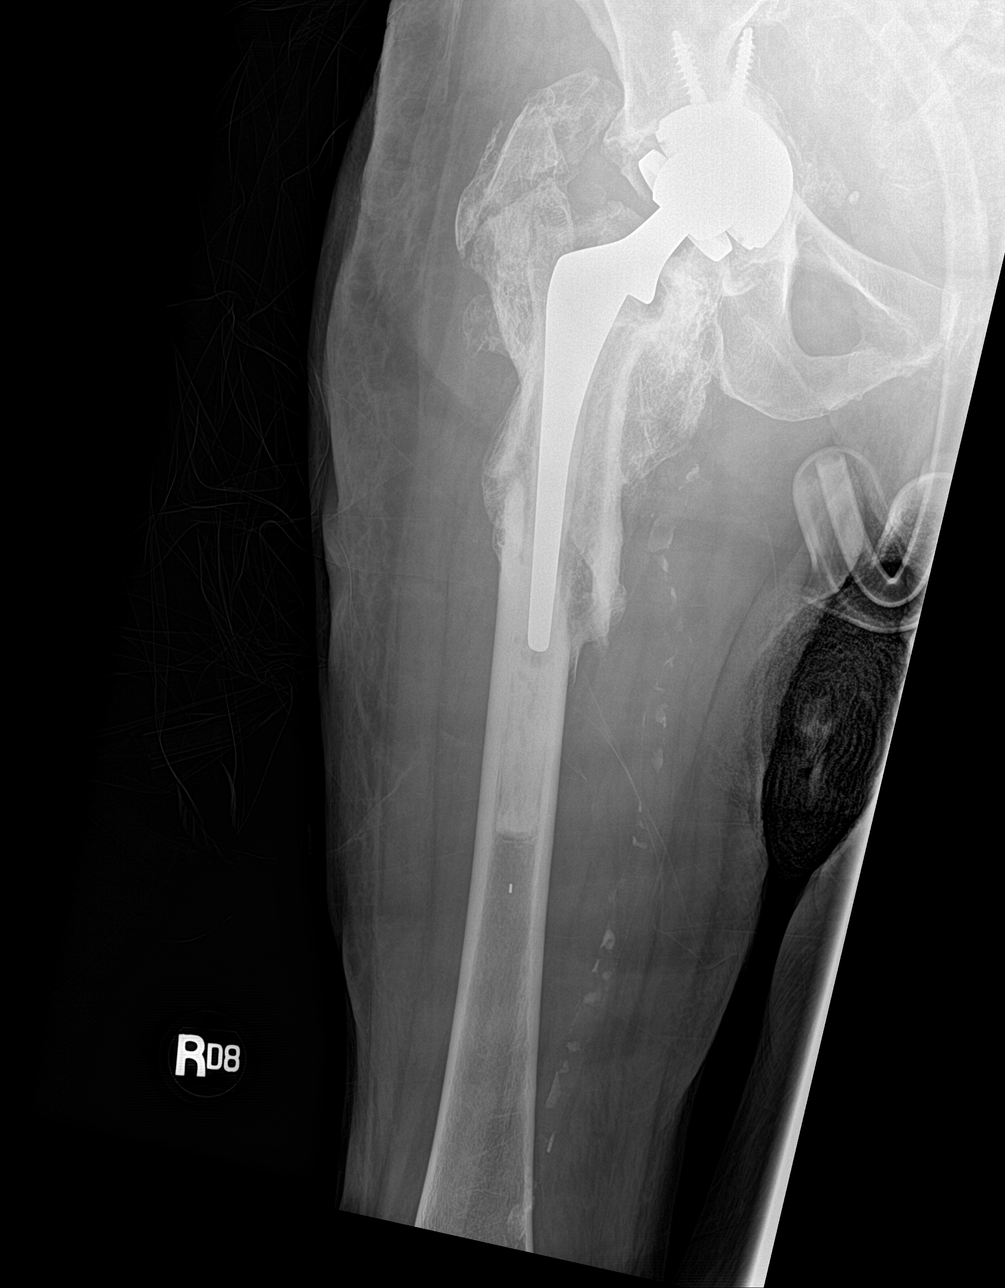

[femur lat (1 of 2)]
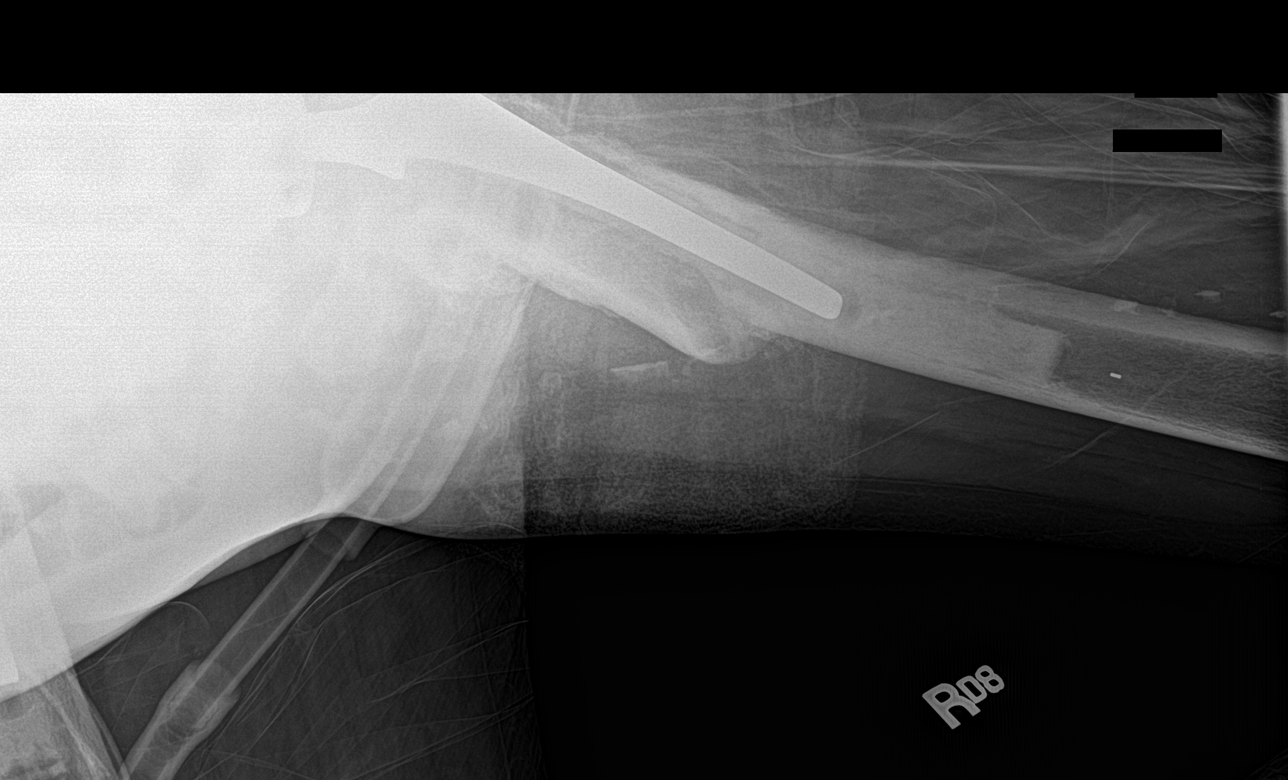

[femur lat (2 of 2)]
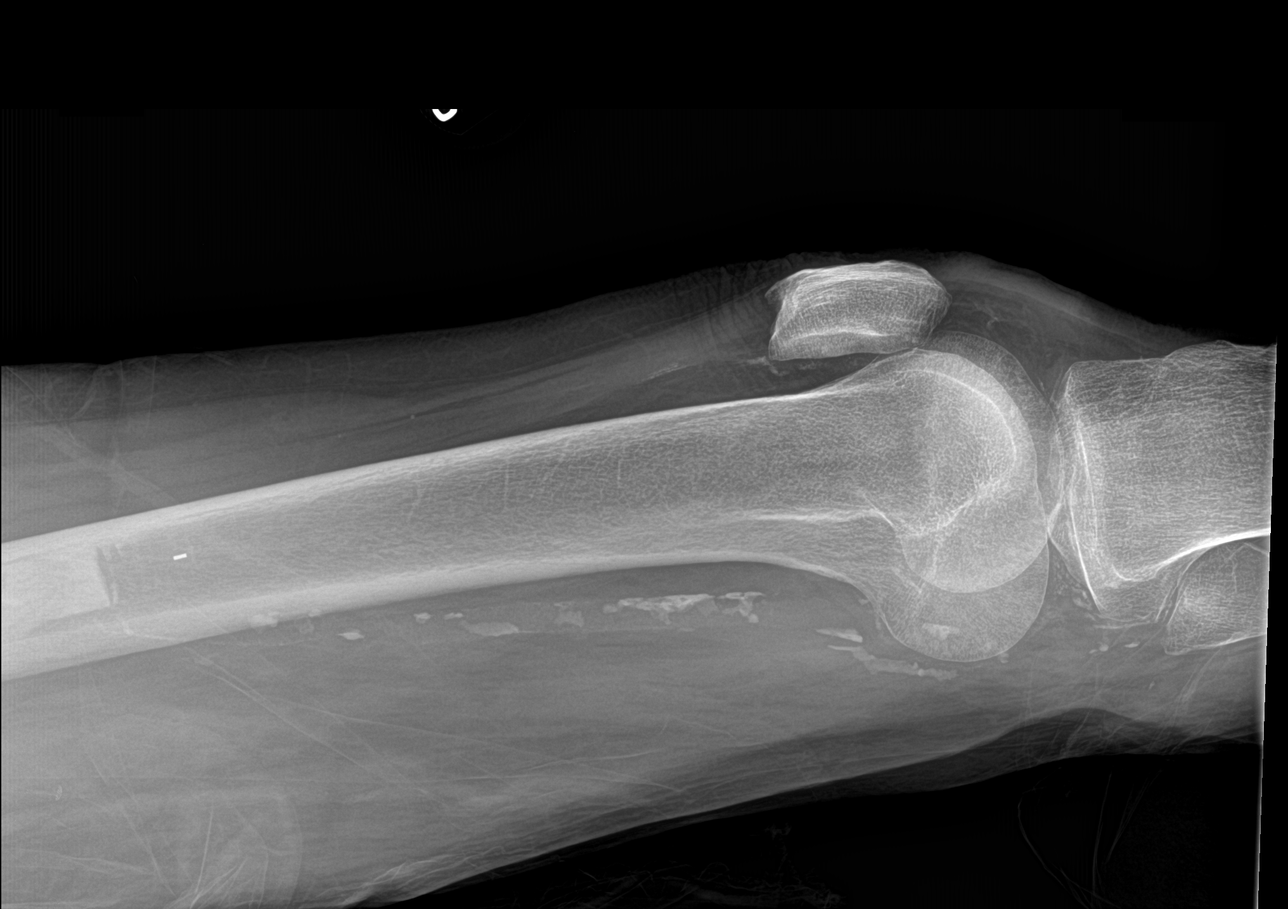

[femur ap (2 of 2)]
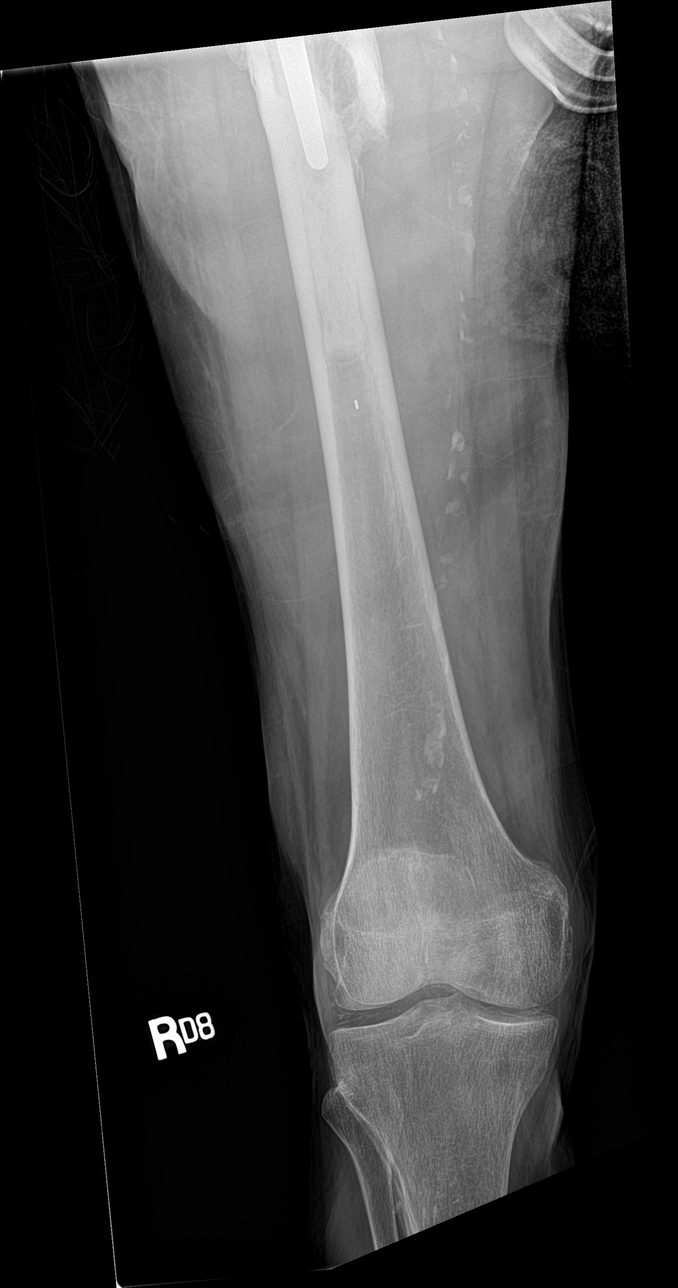

[4 of 4 positions shown; findings below may reference images not displayed]

FINDINGS: Right hip prosthesis is again seen. Healing proximal femoral
fracture is noted with callus formation slightly increased when
compared with prior exam. Acute fracture is seen. No soft tissue
abnormality is noted. Diffuse vascular calcifications are seen.
IMPRESSION: Continued healing in proximal right femoral fracture. No new focal
abnormality is seen.

## 2021-02-13 ENCOUNTER — Encounter (HOSPITAL_BASED_OUTPATIENT_CLINIC_OR_DEPARTMENT_OTHER): Payer: Self-pay | Admitting: Cardiovascular Disease

## 2021-02-13 ENCOUNTER — Telehealth (INDEPENDENT_AMBULATORY_CARE_PROVIDER_SITE_OTHER): Payer: Medicare PPO | Admitting: Cardiovascular Disease

## 2021-02-13 DIAGNOSIS — I5032 Chronic diastolic (congestive) heart failure: Secondary | ICD-10-CM | POA: Diagnosis not present

## 2021-02-13 DIAGNOSIS — E785 Hyperlipidemia, unspecified: Secondary | ICD-10-CM

## 2021-02-13 DIAGNOSIS — I4819 Other persistent atrial fibrillation: Secondary | ICD-10-CM

## 2021-02-13 DIAGNOSIS — I10 Essential (primary) hypertension: Secondary | ICD-10-CM

## 2021-02-13 DIAGNOSIS — R0789 Other chest pain: Secondary | ICD-10-CM

## 2021-02-13 HISTORY — DX: Other chest pain: R07.89

## 2021-02-13 MED ORDER — METOPROLOL TARTRATE 25 MG PO TABS: 25.0000 mg | ORAL_TABLET | Freq: Two times a day (BID) | ORAL | 3 refills | Status: DC

## 2021-02-13 MED ORDER — DICLOFENAC SODIUM 1 % EX GEL: 2.0000 g | Freq: Four times a day (QID) | CUTANEOUS | 1 refills | Status: DC

## 2021-02-13 NOTE — Patient Instructions (Signed)
Medication Instructions:  INCREASE- Metoprolol 25 mg by mouth twice a day START- Voltaren Gel twice a day as needed  *If you need a refill on your cardiac medications before your next appointment, please call your pharmacy*   Lab Work: None Ordered   Testing/Procedures: None Ordered   Follow-Up: At [facility], you and your health needs are our priority.  As part of our continuing mission to provide you with exceptional heart care, we have created designated Provider Care Teams.  These Care Teams include your primary Cardiologist (physician) and Advanced Practice Providers (APPs -  Physician Assistants and Nurse Practitioners) who all work together to provide you with the care you need, when you need it.  We recommend signing up for the patient portal called "MyChart".  Sign up information is provided on this After Visit Summary.  MyChart is used to connect with patients for Virtual Visits (Telemedicine).  Patients are able to view lab/test results, encounter notes, upcoming appointments, etc.  Non-urgent messages can be sent to your provider as well.   To learn more about what you can do with MyChart, go to [email or web address].    Your next appointment:   Monday February 6th @ 2:20 pm  The format for your next appointment:   Virtual Visit   Provider:   Chilton Si, MD

## 2021-02-13 NOTE — Progress Notes (Signed)
Virtual Visit via Video Note   This visit type was conducted due to national recommendations for restrictions regarding the COVID-19 Pandemic (e.g. social distancing) in an effort to limit this patient's exposure and mitigate transmission in our community.  Due to her co-morbid illnesses, this patient is at least at moderate risk for complications without adequate follow up.  This format is felt to be most appropriate for this patient at this time.  All issues noted in this document were discussed and addressed.  A limited physical exam was performed with this format.  Please refer to the patient's chart for her consent to telehealth for [facility].  The patient was identified using 2 identifiers.  Date:  02/13/2021   ID:  Yolanda Baker, DOB [birth year], MRN [id number]  Patient Location: Home Provider Location: Office/Clinic  PCP:  Rodrigo Ran, MD  Cardiologist:  Chilton Si, MD  Electrophysiologist:  None   Evaluation Performed:  Follow-Up Visit  Chief Complaint:    History of Present Illness:    The patient does not have symptoms concerning for COVID-19 infection (fever, chills, cough, or new shortness of breath).   Yolanda Baker is a 87 y.o. female with atrial fibrillation, hypertension, prior stroke, and dementia being seen today for follow up.  She was admitted 01/2019 with shortness of breath.  She was noted to be in new onset atrial fibrillation with rapid ventricular response.  She was mildly volume overloaded with a BNP of 335 and a pleural effusion on chest x-ray.  Creatinine was 1.8.  Symptoms improved with IV metoprolol and Lasix.  She was started on oral metoprolol and Lasix prior to discharge.  Echo at that time revealed LVEF 50 to 55% with moderate LVH and a possibility of cardiac amyloidosis.  Diastolic function was indeterminate.  Right ventricular function was normal.  She was seen by Edd Fabian, NP, on 01/2019.  The possibility of working her up for  amyloidosis was discussed with her daughter and she like to not to pursue it.  She was admitted to the hospital 04/2019 with a right periprosthetic hip fracture.  This occurred in the setting of falling out of her bed.  Conservative treatment with physical therapy and hip bracing was elected.  She was then admitted 04/2019 with shortness of breath thought to be due to pneumonia.  She followed up with Verdon Cummins virtually on 04/2019 and was doing better.  Lately she has been feeling mostly well.  She notes that she is sometimes short of breath. She notices it most when she is active.  She sometimes notes it when laying in bed as well.  She has no orthopnea or PND.  Her weight has been stable around 100lb. She and her daughter have not noticed any edema.  She is still wearing a brace and is non-weight bearing.  She hopes to be able to walk soon but is waiting on a follow-up x-ray.  She has her blood pressure checked sporadically at her assisted living facility.  Her blood pressure tends to run in the 130s to 150s over 60s to 80s in the morning before taking her medication.  Later in the day it is in the 120s over 60s.  Her heart rate is pretty consistently in the 60s to 70s.  She denies palpitations.   Yolanda Baker reported exertional dyspnea.  She did not appear to be volume overloaded on limited exam.  We recommended that she have a BNP checked.  This did not occur.  She was admitted 08/2019 with increasing shortness of breath.  She was hypoxic and tachycardic with atrial fibrillation with RVR.  CTA was negative for PE.  She did have pleural effusions and atelectasis.  She was treated with Lasix and rate control.  Echo revealed LVEF 45 to 50% with global hypokinesis.  Family requested cardiology consultation.  She was started on oral amiodarone.  Metoprolol was increased to 75mg .  She was again admitted 09/2019 with shortness of breath.  She was hypoxic to 88% on room air.  She was in atrial fibrillation that was  rate-controlled.  She was again admitted 09/2019 with acute on chronic heart failure.  She was doing fine and then acutely became short of breath with physical therapy and Occupational Therapy.  She was hypoxic and BNP was 415.  She had bilateral pleural effusions that were persistent from prior.  She required diuresis with IV Lasix.  At discharge Lasix was increased from 20 mg every other day to daily.  D-dimer was mildly elevated and CT-a was negative for PE.    Yolanda Baker was seen in the hospital 11/2019 with generalized weakness and bradycardia.  Amiodarone was held and metoprolol was reduced. Since then her PCP stopped amlodipine due to concern for bradycardia.  Since then her heart rate has remained low.  She reports feeling generally fatigued.  She broke her femur and was not a surgical candidate.  She had to stop participating with physical therapy because her heart rate got too high when she exercised.  She is now essentially bedbound or in her wheelchair.  Yolanda Baker is being cared for by palliative care.  Since her last appointment she has seen the atrial fibrillation team.  She was hospitalized due to bradycardia.  This resolved with stopping amiodarone.  There were no episodes of syncope.  Given that she was so frail her family elected palliative therapy rather than getting a pacemaker.  At her last appointment she reported some chest wall tenderness that was not thought to be ischemic.  She was given Voltaren and this helped. Today, she is accompanied by a family member. She is feeling okay overall aside from feeling a little chest pressure, but "not much". She does continue to feel some chest tenderness on palpation as well. It was thought her chest pain may be related to acid reflux, and last Wednesday she was started on omeprazole which seems to help relieve her pain. Per their daily log, in 01/2021 her heart rate has averaged 100 bpm. She denies any palpitations, or shortness of breath. No  lightheadedness, headaches, syncope, orthopnea, PND, lower extremity edema or exertional symptoms.   Past Medical History:  Diagnosis Date   Anemia    Anxiety    Arthritis    neck   Bruises easily    Chronic diastolic CHF (congestive heart failure) (HCC)    Chronic diastolic heart failure (HCC)    COPD (chronic obstructive pulmonary disease) (HCC)    Dementia (HCC)    Depression    Dysrhythmia    hx A-FLUTTER after hip surg April 2015- take metoprolol   Eczema    Falls    Hip fracture (HCC)    Hip joint pain    RT   Hypertension    Musculoskeletal chest pain 02/13/2021   Osteoporosis    Persistent atrial fibrillation ([place]) 08/04/2019   Pneumonia    august 2017   Stroke [facility])    Past Surgical History:  Procedure Laterality Date   ABDOMINAL  HYSTERECTOMY     FRACTURE SURGERY Right 06/05/2013   hip   HIP ARTHROPLASTY Right 10/03/2013   Procedure: CONVERSION OF PREVIOUS RIGHT HIP SURGERY TO A RIGHT TOTAL HIP ARTHROPLASTY;  Surgeon: Loanne Drilling, MD;  Location: [facility];  Service: Orthopedics;  Laterality: Right;   INTRAMEDULLARY (IM) NAIL INTERTROCHANTERIC Right 06/05/2013   Procedure: INTRAMEDULLARY (IM) NAIL INTERTROCHANTRIC;  Surgeon: Loanne Drilling, MD;  Location: [facility];  Service: Orthopedics;  Laterality: Right;   JOINT REPLACEMENT     left hip   left hip Left 2008   LEG SURGERY Right    x2     Current Meds  Medication Sig   acetaminophen (TYLENOL) 500 MG tablet Take 1,000 mg by mouth in the morning, at noon, and at bedtime.   apixaban (ELIQUIS) 2.5 MG TABS tablet Take 1 tablet (2.5 mg total) by mouth 2 (two) times daily.   busPIRone (BUSPAR) 5 MG tablet Take 5 mg by mouth 2 (two) times daily.   Calcium Carb-Cholecalciferol 600-100 MG-UNIT CAPS Take 1 tablet by mouth in the morning and at bedtime.   cephALEXin (KEFLEX) 500 MG capsule Take 1 capsule (500 mg total) by mouth 4 (four) times daily.   chlorhexidine (PERIDEX) 0.12 % solution Use as directed 15 mLs in the  mouth or throat 2 (two) times daily. Swish and spit   citalopram (CELEXA) 20 MG tablet Take 1 tablet (20 mg total) by mouth daily.   diclofenac Sodium (VOLTAREN) 1 % GEL Apply 2 g topically 4 (four) times daily.   furosemide (LASIX) 20 MG tablet Take 20 mg by mouth daily.   galantamine (RAZADYNE ER) 8 MG 24 hr capsule Take 8 mg by mouth daily with breakfast.   hydrALAZINE (APRESOLINE) 50 MG tablet Take 50 mg by mouth 3 (three) times daily.   ipratropium-albuterol (DUONEB) 0.5-2.5 (3) MG/3ML SOLN Take 3 mLs by nebulization every 6 (six) hours as needed (shortness of breath).   loperamide (IMODIUM) 2 MG capsule Take 2 mg by mouth 3 (three) times daily as needed for diarrhea or loose stools.    Multiple Vitamins-Minerals (CERTAVITE SENIOR/ANTIOXIDANT PO) Take 1 tablet by mouth daily.    omeprazole (PRILOSEC) 20 MG capsule Take 20 mg by mouth daily.   Polyethyl Glycol-Propyl Glycol (SYSTANE) 0.4-0.3 % SOLN Place 1 drop into both eyes in the morning and at bedtime. May use an additional 1 drop in each eye every hour as needed for irritation or dryness.   polyethylene glycol (MIRALAX / GLYCOLAX) 17 g packet Take 17 g by mouth every other day. May also take an additional 17gm once daily as needed for constipation   potassium chloride SA (K-DUR,KLOR-CON) 20 MEQ tablet Take 20 mEq by mouth every other day.   psyllium (REGULOID) 0.52 g capsule Take 0.52 g by mouth daily.   sodium chloride (OCEAN) 0.65 % SOLN nasal spray Place 2 sprays into both nostrils daily.   ziprasidone (GEODON) 20 MG capsule TAKE ONE CAPSULE BY MOUTH DAILY IN THE AFTERNOON **DO NOT CRUSH** **NO REFILLS** (Patient taking differently: Take 20 mg by mouth daily at 2 PM.)   [DISCONTINUED] metoprolol tartrate (LOPRESSOR) 25 MG tablet Take 0.5 tablets (12.5 mg total) by mouth 2 (two) times daily.     Allergies:   Flagyl [metronidazole]   Social History   Tobacco Use   Smoking status: Never   Smokeless tobacco: Never  Vaping Use    Vaping Use: Never used  Substance Use Topics   Alcohol use: No  Alcohol/week: 0.0 standard drinks   Drug use: No     Family Hx: The patient's family history includes Heart attack in her father; Hypertension in her brother, father, and sister; Stroke in her sister.  ROS:   Please see the history of present illness.    (+) Chest pain/pressure (+) Acid reflux All other systems reviewed and are negative.   Prior CV studies:   The following studies were reviewed today:  Monitor 12/2019: 3 Day Zio Monitor   Quality: Fair.  Baseline artifact. Predominant rhythm: sinus rhythm Average heart rate: 60 bpm Max heart rate: 187 bpm Min heart rate: 49 bpm Pauses >2.5 seconds: none 17% atrial fibrillation burden Several runs of ventricular tachycardia vs. Atrial fibrillation with aberrancy.  Longest episode lasted 4 min 15 sec at 166 bpm.   2 runs of SVT lasting up to 7 minutes Blocked PACs  Echo 08/2019:  1. Left ventricular ejection fraction, by estimation, is 45 to 50%. The  left ventricle has mildly decreased function. The left ventricle  demonstrates global hypokinesis. There is mild left ventricular  hypertrophy. Left ventricular diastolic parameters  are indeterminate.   2. Right ventricular systolic function is normal. The right ventricular  size is normal. There is normal pulmonary artery systolic pressure. The  estimated right ventricular systolic pressure is 0000000 mmHg.   3. Left atrial size was moderately dilated.   4. Right atrial size was mildly dilated.   5. The mitral valve is normal in structure. Mild mitral valve  regurgitation. No evidence of mitral stenosis.   6. Tricuspid valve regurgitation is moderate.   7. The aortic valve is tricuspid. Aortic valve regurgitation is not  visualized. Mild aortic valve sclerosis is present, with no evidence of  aortic valve stenosis.   8. The inferior vena cava is normal in size with greater than 50%  respiratory variability,  suggesting right atrial pressure of 3 mmHg.   Echo 01/2019: 1. Left ventricular ejection fraction, by visual estimation, is 50 to  55%. The left ventricle has low normal function. There is moderately  increased left ventricular hypertrophy. The LV morphology could be  consistent with cardiac amyloidosis.   2. Left ventricular diastolic parameters are indeterminate.   3. The left ventricle has no regional wall motion abnormalities.   4. Global right ventricle has normal systolic function.The right  ventricular size is normal. No increase in right ventricular wall  thickness.   5. Left atrial size was mildly dilated.   6. Right atrial size was mildly dilated.   7. Trivial pericardial effusion is present.   8. The mitral valve is normal in structure. Mild mitral valve  regurgitation. No evidence of mitral stenosis.   9. The tricuspid valve is normal in structure. Tricuspid valve  regurgitation is trivial.  10. The aortic valve is tricuspid. Aortic valve regurgitation is not  visualized. No evidence of aortic valve sclerosis or stenosis.  11. The tricuspid regurgitant velocity is 2.48 m/s, and with an assumed  right atrial pressure of 8 mmHg, the estimated right ventricular systolic  pressure is mildly elevated at 32.6 mmHg.  12. The inferior vena cava is normal in size with <50% respiratory  variability, suggesting right atrial pressure of 8 mmHg.   Labs/Other Tests and Data Reviewed:    EKG:  No ECG reviewed.   Recent Labs: 10/18/2020: ALT 10; BUN 11; Creatinine, Ser 1.10; Hemoglobin 11.6; Platelets 282; Potassium 4.3; Sodium 128   Recent Lipid Panel Lab Results  Component Value  Date/Time   CHOL 148 06/30/2014 02:46 AM   TRIG 43 06/30/2014 02:46 AM   HDL 59 06/30/2014 02:46 AM   CHOLHDL 2.5 06/30/2014 02:46 AM   LDLCALC 80 06/30/2014 02:46 AM    Wt Readings from Last 3 Encounters:  02/13/21 110 lb (49.9 kg)  01/19/20 105 lb (47.6 kg)  01/11/20 105 lb (47.6 kg)      Objective:    BP (!) 141/79    Pulse (!) 105    Ht 5\' 1"  (1.549 m)    Wt 110 lb (49.9 kg)    BMI 20.78 kg/m  GENERAL: Well-appearing.  No acute distress.  Frail.  Resting tremor. HEENT: Pupils equal round.  Oral mucosa unremarkable NECK:  No jugular venous distention, no visible thyromegaly EXT:  No edema, no cyanosis no clubbing SKIN:  No rashes no nodules NEURO:  Speech fluent.  Cranial nerves grossly intact.  Moves all 4 extremities freely PSYCH:  Cognitively intact, oriented to person place and time   ASSESSMENT & PLAN:    Persistent atrial fibrillation ([place]) Heart rates are poorly controlled in atrial fibrillation.  Amiodarone previously discontinued due to bradycardia.  Today her heart rate is 105 and her daughter notes that it has been averaging around 100.  We will increase metoprolol to 25 mg twice a day.  We will ask the assisted living staff to continue checking her blood pressure and heart rate regularly.  Continue Eliquis.  Chronic diastolic heart failure ([place]) She has been admitted multiple times for heart failure exacerbations.  Lately she has been stable and her weight has been stable.  She has no edema or shortness of breath.  Continue current management.  Essential hypertension Blood pressure is reasonably controlled.  Continue hydralazine and increase metoprolol to 25 mg twice daily.  Musculoskeletal chest pain She has chest pain that has been responsive to Voltaren gel.  She is currently struggling with some chest pain on exam.  We will give back the Voltaren gel twice daily as needed for musculoskeletal chest pain.  She has no ischemic symptoms.  She is also being treated for GERD, which she thinks is helping.  Medication Adjustments/Labs and Tests Ordered: Current medicines are reviewed at length with the patient today.  Concerns regarding medicines are outlined above.   Tests Ordered: No orders of the defined types were placed in this  encounter.   Medication Changes: Meds ordered this encounter  Medications   metoprolol tartrate (LOPRESSOR) 25 MG tablet    Sig: Take 1 tablet (25 mg total) by mouth 2 (two) times daily.    Dispense:  180 tablet    Refill:  3   diclofenac Sodium (VOLTAREN) 1 % GEL    Sig: Apply 2 g topically 4 (four) times daily.    Dispense:  50 g    Refill:  1    Follow Up: In 2-3 months   COVID-19 Education: The signs and symptoms of COVID-19 were discussed with the patient and how to seek care for testing (follow up with PCP or arrange E-visit).  The importance of social distancing was discussed today.  Time:   Today, I have spent 22 minutes with the patient with telehealth technology discussing the above problems.    I,Mathew Stumpf,acting as a Education administrator for Skeet Latch, MD.,have documented all relevant documentation on the behalf of Skeet Latch, MD,as directed by  Skeet Latch, MD while in the presence of Skeet Latch, MD.  I, [place] Oval Linsey, MD have reviewed all  documentation for this visit.  The documentation of the exam, diagnosis, procedures, and orders on 02/13/2021 are all accurate and complete.   Signed, Skeet Latch, MD  02/13/2021 5:56 PM    [place]

## 2021-02-13 NOTE — Assessment & Plan Note (Signed)
Heart rates are poorly controlled in atrial fibrillation.  Amiodarone previously discontinued due to bradycardia.  Today her heart rate is 105 and her daughter notes that it has been averaging around 100.  We will increase metoprolol to 25 mg twice a day.  We will ask the assisted living staff to continue checking her blood pressure and heart rate regularly.  Continue Eliquis.

## 2021-02-13 NOTE — Assessment & Plan Note (Signed)
She has chest pain that has been responsive to Voltaren gel.  She is currently struggling with some chest pain on exam.  We will give back the Voltaren gel twice daily as needed for musculoskeletal chest pain.  She has no ischemic symptoms.  She is also being treated for GERD, which she thinks is helping.

## 2021-02-13 NOTE — Assessment & Plan Note (Signed)
She has been admitted multiple times for heart failure exacerbations.  Lately she has been stable and her weight has been stable.  She has no edema or shortness of breath.  Continue current management.

## 2021-02-13 NOTE — Assessment & Plan Note (Signed)
Blood pressure is reasonably controlled.  Continue hydralazine and increase metoprolol to 25 mg twice daily.

## 2021-02-13 NOTE — Assessment & Plan Note (Signed)
Statin discontinued due to palliative care focus.

## 2021-03-01 ENCOUNTER — Telehealth (HOSPITAL_BASED_OUTPATIENT_CLINIC_OR_DEPARTMENT_OTHER): Payer: Self-pay | Admitting: *Deleted

## 2021-03-01 MED ORDER — DICLOFENAC SODIUM 1 % EX GEL: 2.0000 g | Freq: Two times a day (BID) | CUTANEOUS | 1 refills | Status: DC | PRN

## 2021-03-01 NOTE — Telephone Encounter (Signed)
Received fax from [facility] asking where to apply the Voltaren topical gel Below from 12/19 visit   Musculoskeletal chest pain She has chest pain that has been responsive to Voltaren gel.  She is currently struggling with some chest pain on exam.  We will give back the Voltaren gel twice daily as needed for musculoskeletal chest pain.  She has no ischemic symptoms.   Update Rx and will fax once Dr Oval Linsey has signed tomorrow

## 2021-03-02 MED ORDER — DICLOFENAC SODIUM 1 % EX GEL: 2.0000 g | Freq: Two times a day (BID) | CUTANEOUS | 1 refills | Status: DC | PRN

## 2021-03-03 NOTE — Telephone Encounter (Signed)
Faxed, confirmation received 

## 2021-03-10 NOTE — Telephone Encounter (Signed)
Patient had visit with Dr Duke Salvia 02/13/2022, Metoprolol increased to 25 mg twice a day

## 2021-04-03 ENCOUNTER — Telehealth (HOSPITAL_BASED_OUTPATIENT_CLINIC_OR_DEPARTMENT_OTHER): Payer: Medicare PPO | Admitting: Cardiovascular Disease

## 2021-04-13 ENCOUNTER — Encounter (HOSPITAL_BASED_OUTPATIENT_CLINIC_OR_DEPARTMENT_OTHER): Payer: Self-pay | Admitting: Cardiovascular Disease

## 2021-04-13 NOTE — Telephone Encounter (Signed)
Good that the NP from [facility] checked on it. Presume pulses were checked. Please ask patient's daughter if there is any pain to the left foot. Agree with compression socks.   She cancelled her 2 mos video visit with Dr. Duke Salvia - recommend scheduling video visit with Dr. Duke Salvia or Alver Sorrow, NP within the next few weeks.   Alver Sorrow, NP

## 2021-04-19 NOTE — Progress Notes (Signed)
Office Visit    Patient Name: Yolanda Baker Date of Encounter: 04/21/2021  PCP:  Crist Infante, [place]  Cardiologist:  Skeet Latch, MD  Advanced Practice Provider:  No care team member to display Electrophysiologist:  None      Chief Complaint    Yolanda Baker is a 88 y.o. female with a hx of atrial fibrillation, HTN, prior CVA, dementia presents today for lower extremity edema.    Past Medical History    Past Medical History:  Diagnosis Date   Anemia    Anxiety    Arthritis    neck   Bruises easily    Chronic diastolic CHF (congestive heart failure) (HCC)    Chronic diastolic heart failure (HCC)    COPD (chronic obstructive pulmonary disease) (HCC)    Dementia (HCC)    Depression    Dysrhythmia    hx A-FLUTTER after hip surg April 2015- take metoprolol   Eczema    Falls    Hip fracture (HCC)    Hip joint pain    RT   Hypertension    Musculoskeletal chest pain 02/13/2021   Osteoporosis    Persistent atrial fibrillation ([place]) 08/04/2019   Pneumonia    august 2017   Stroke [facility])    Past Surgical History:  Procedure Laterality Date   ABDOMINAL HYSTERECTOMY     FRACTURE SURGERY Right 06/05/2013   hip   HIP ARTHROPLASTY Right 10/03/2013   Procedure: CONVERSION OF PREVIOUS RIGHT HIP SURGERY TO A RIGHT TOTAL HIP ARTHROPLASTY;  Surgeon: Gearlean Alf, MD;  Location: [facility];  Service: Orthopedics;  Laterality: Right;   INTRAMEDULLARY (IM) NAIL INTERTROCHANTERIC Right 06/05/2013   Procedure: INTRAMEDULLARY (IM) NAIL INTERTROCHANTRIC;  Surgeon: Gearlean Alf, MD;  Location: [facility];  Service: Orthopedics;  Laterality: Right;   JOINT REPLACEMENT     left hip   left hip Left 2008   LEG SURGERY Right    x2    Allergies  Allergies  Allergen Reactions   Flagyl [Metronidazole] Other (See Comments)    Fingers get "numb"    History of Present Illness    Yolanda Baker is a 88 y.o. female with a hx of atrial fibrillation,  HTN, dementia, prior CVA last seen 02/13/21 via video visit.  Admitted 01/2019 with shortness of breath with new onset atrial fibrillation. Improved with IV metoprolol and Lasix. Ech owith LVEF 50-55%, moderate LVH, possibility of cardiac amyloidosis, Diastolic function indeterminite, normal RVSF. Patient and family elected not to pursue workup for amyloidosis. Readmitted 04/2019 with right periprosthetic hip fracture in setting of falling out of bed. Had conservative treatment. Readmitted later that month with shortness of breath thought to be due to pneumonia. Readmitted 08/2019 with atrial fibrillation with RVR. CTA negative for PE though did not pleural effusion and atelectasis treated with IV Lasix and rate control. Echo with LVEF 45-50% with global hypokinesis and was started on Amiodarone. Admitted 09/2019 once for hypoxia and once for chronic heart failure. Hospitalized 11/2019 with weakness and bradycardia, Amiodarone discontinued. Family elected for palliative therapy rather tham pacemaker.   During last video visit 02/13/21 noted elevated heart rate 100bpm - Metoprolol increased to 25mg  BID.   She presents today for follow up with her son and daughter. Family member contacted the office 04/13/21 noting foot swelling, wound with difficult healing. HH RN was treating wound and [facility] recommended SCD. Reports no shortness of breath nor dyspnea on  exertion. Reports no chest pain, pressure, or tightness. No edema, orthopnea, PND. Reports no palpitations.  She is mostly sedentary but does transfer from bed to wheelchair. On review of med list [facility] - has been taking Metoprolol only PRN and daily heart rate shows that >10 days this month her heart rate has been >100bpm but unclear whether she was given Metoprolol based on documentation.   EKGs/Labs/Other Studies Reviewed:   The following studies were reviewed today: Monitor 12/2019: 3 Day Zio Monitor   Quality: Fair.   Baseline artifact. Predominant rhythm: sinus rhythm Average heart rate: 60 bpm Max heart rate: 187 bpm Min heart rate: 49 bpm Pauses >2.5 seconds: none 17% atrial fibrillation burden Several runs of ventricular tachycardia vs. Atrial fibrillation with aberrancy.  Longest episode lasted 4 min 15 sec at 166 bpm.   2 runs of SVT lasting up to 7 minutes Blocked PACs   Echo 08/2019:  1. Left ventricular ejection fraction, by estimation, is 45 to 50%. The  left ventricle has mildly decreased function. The left ventricle  demonstrates global hypokinesis. There is mild left ventricular  hypertrophy. Left ventricular diastolic parameters  are indeterminate.   2. Right ventricular systolic function is normal. The right ventricular  size is normal. There is normal pulmonary artery systolic pressure. The  estimated right ventricular systolic pressure is 0000000 mmHg.   3. Left atrial size was moderately dilated.   4. Right atrial size was mildly dilated.   5. The mitral valve is normal in structure. Mild mitral valve  regurgitation. No evidence of mitral stenosis.   6. Tricuspid valve regurgitation is moderate.   7. The aortic valve is tricuspid. Aortic valve regurgitation is not  visualized. Mild aortic valve sclerosis is present, with no evidence of  aortic valve stenosis.   8. The inferior vena cava is normal in size with greater than 50%  respiratory variability, suggesting right atrial pressure of 3 mmHg.    Echo 01/2019: 1. Left ventricular ejection fraction, by visual estimation, is 50 to  55%. The left ventricle has low normal function. There is moderately  increased left ventricular hypertrophy. The LV morphology could be  consistent with cardiac amyloidosis.   2. Left ventricular diastolic parameters are indeterminate.   3. The left ventricle has no regional wall motion abnormalities.   4. Global right ventricle has normal systolic function.The right  ventricular size is normal. No  increase in right ventricular wall  thickness.   5. Left atrial size was mildly dilated.   6. Right atrial size was mildly dilated.   7. Trivial pericardial effusion is present.   8. The mitral valve is normal in structure. Mild mitral valve  regurgitation. No evidence of mitral stenosis.   9. The tricuspid valve is normal in structure. Tricuspid valve  regurgitation is trivial.  10. The aortic valve is tricuspid. Aortic valve regurgitation is not  visualized. No evidence of aortic valve sclerosis or stenosis.  11. The tricuspid regurgitant velocity is 2.48 m/s, and with an assumed  right atrial pressure of 8 mmHg, the estimated right ventricular systolic  pressure is mildly elevated at 32.6 mmHg.  12. The inferior vena cava is normal in size with <50% respiratory  variability, suggesting right atrial pressure of 8 mmHg.   EKG:  EKG is ordered today.  The ekg ordered today demonstrates atrial fibrillation 13 bpm with rightward axis and no acute ST/T wave changes.   Recent Labs: 10/18/2020: ALT 10; BUN 11; Creatinine, Ser  1.10; Hemoglobin 11.6; Platelets 282; Potassium 4.3; Sodium 128  Recent Lipid Panel    Component Value Date/Time   CHOL 148 06/30/2014 0246   TRIG 43 06/30/2014 0246   HDL 59 06/30/2014 0246   CHOLHDL 2.5 06/30/2014 0246   VLDL 9 06/30/2014 0246   LDLCALC 80 06/30/2014 0246    Risk Assessment/Calculations:   CHA2DS2-VASc Score = 7 This indicates a 11.2% annual risk of stroke. The patient's score is based upon: CHF History: 1 HTN History: 1 Diabetes History: 0 Stroke History: 2 Vascular Disease History: 0 Age Score: 2 Gender Score: 1     Home Medications   Current Meds  Medication Sig   acetaminophen (TYLENOL) 500 MG tablet Take 1,000 mg by mouth in the morning, at noon, and at bedtime.   apixaban (ELIQUIS) 2.5 MG TABS tablet Take 1 tablet (2.5 mg total) by mouth 2 (two) times daily.   busPIRone (BUSPAR) 10 MG tablet Take 10 mg by mouth daily.    Calcium Carb-Cholecalciferol 600-100 MG-UNIT CAPS Take 1 tablet by mouth in the morning and at bedtime.   chlorhexidine (PERIDEX) 0.12 % solution Use as directed 15 mLs in the mouth or throat 2 (two) times daily. Swish and spit   citalopram (CELEXA) 20 MG tablet Take 1 tablet (20 mg total) by mouth daily.   diclofenac Sodium (VOLTAREN) 1 % GEL Apply 2 g topically 2 (two) times daily as needed (for musculoskeletal chest pain). Apply to the chest area   furosemide (LASIX) 20 MG tablet Take 20 mg by mouth daily.   galantamine (RAZADYNE ER) 8 MG 24 hr capsule Take 8 mg by mouth daily with breakfast.   loperamide (IMODIUM) 2 MG capsule Take 2 mg by mouth 3 (three) times daily as needed for diarrhea or loose stools.    Multiple Vitamins-Minerals (CERTAVITE SENIOR/ANTIOXIDANT PO) Take 1 tablet by mouth daily.    Polyethyl Glycol-Propyl Glycol (SYSTANE) 0.4-0.3 % SOLN Place 1 drop into both eyes in the morning and at bedtime. May use an additional 1 drop in each eye every hour as needed for irritation or dryness.   polyethylene glycol (MIRALAX / GLYCOLAX) 17 g packet Take 17 g by mouth every other day. May also take an additional 17gm once daily as needed for constipation   [DISCONTINUED] busPIRone (BUSPAR) 5 MG tablet Take 5 mg by mouth 2 (two) times daily.   [DISCONTINUED] metoprolol tartrate (LOPRESSOR) 25 MG tablet Take 25 mg by mouth as needed. For SOB and heart Rate greater than 110 beats per minute     Review of Systems      All other systems reviewed and are otherwise negative except as noted above.  Physical Exam    VS:  BP 138/80 (BP Location: Right Arm, Patient Position: Sitting, Cuff Size: Normal)    Pulse (!) 103    Ht 5\' 1"  (1.549 m)    Wt 113 lb (51.3 kg)    BMI 21.35 kg/m  , BMI Body mass index is 21.35 kg/m.  Wt Readings from Last 3 Encounters:  04/21/21 113 lb (51.3 kg)  02/13/21 110 lb (49.9 kg)  01/19/20 105 lb (47.6 kg)     GEN: Well nourished, well developed, in no acute  distress. HEENT: normal. Neck: Supple, no JVD, carotid bruits, or masses. Cardiac: irregularly irregular, no murmurs, rubs, or gallops. No clubbing, cyanosis, edema.  Radials/PT 2+ and equal bilaterally.  Respiratory:  Respirations regular and unlabored, clear to auscultation bilaterally. GI: Soft, nontender, nondistended. MS: No  deformity or atrophy. Skin: Warm and dry, no rash. Bilateral LE with varicose veins.  Neuro:  Strength and sensation are intact. Psych: Normal affect.  Assessment & Plan    Persistent atrial fibrillation / Chronic anticoagulation -  Rate not controlled by EKG today. Only receiving Metoprolol Tartrate 25mg  PRN. Will change to Metoprolol Tartrate 25mg  BID (hold for HR <60 bpm) as per previous recommendations by Dr. Oval Linsey [facility] provided for [facility].  Reduced dose Eliquis due to age, body habitus. Denies bleeding complications. CHA2DS2-VASc Score = 7 [CHF History: 1, HTN History: 1, Diabetes History: 0, Stroke History: 2, Vascular Disease History: 0, Age Score: 2, Gender Score: 1].  Therefore, the patient's annual risk of stroke is 11.2 %.     Venous insufficiency - Bilateral lower extremity with varicose veins. No edema. Palpable pulses. Encouraged elevating lower extremities, compression stockings. Daughter has purchased SCDs. Reassurance provided of no evidence of PAD. Has nonadherent foam dressing to wound on right lateral ankle followed by home health which is improving per family report.   Chronic diastolic heart failure - Euvolemic and well compensated on exam. GDMT includes Metoprolol, Lasix. Low sodium diet and fluid restriction <2L encouraged.   HTN - BP reasonably well controlled. Previously on Hydralazine which was discontinued by unknown provider. If SBP consistently >140, consider resuming antihypertensive therapy. Will defer today as we are resuming Metoprolol as detailed above.   Musculoskeletal chest pain - Improved with Voltaren gel.  GERD  - Well controlled.   Disposition: Follow up in 2 month(s) with Skeet Latch, MD as scheduled via video visit.   Signed, Loel Dubonnet, NP 04/21/2021, 1:42 PM [facility]

## 2021-04-21 ENCOUNTER — Other Ambulatory Visit: Payer: Self-pay

## 2021-04-21 ENCOUNTER — Encounter (HOSPITAL_BASED_OUTPATIENT_CLINIC_OR_DEPARTMENT_OTHER): Payer: Self-pay | Admitting: Family

## 2021-04-21 ENCOUNTER — Ambulatory Visit (HOSPITAL_BASED_OUTPATIENT_CLINIC_OR_DEPARTMENT_OTHER): Payer: Medicare PPO | Admitting: Family

## 2021-04-21 VITALS — BP 138/80 | HR 103 | Ht 61.0 in | Wt 113.0 lb

## 2021-04-21 DIAGNOSIS — I10 Essential (primary) hypertension: Secondary | ICD-10-CM | POA: Diagnosis not present

## 2021-04-21 DIAGNOSIS — I4819 Other persistent atrial fibrillation: Secondary | ICD-10-CM

## 2021-04-21 DIAGNOSIS — D6859 Other primary thrombophilia: Secondary | ICD-10-CM

## 2021-04-21 DIAGNOSIS — I5032 Chronic diastolic (congestive) heart failure: Secondary | ICD-10-CM

## 2021-04-21 DIAGNOSIS — I872 Venous insufficiency (chronic) (peripheral): Secondary | ICD-10-CM

## 2021-04-21 MED ORDER — METOPROLOL TARTRATE 25 MG PO TABS: 25.0000 mg | ORAL_TABLET | Freq: Two times a day (BID) | ORAL | 2 refills | Status: DC

## 2021-04-21 NOTE — Patient Instructions (Addendum)
Medication Instructions:  Your physician has recommended you make the following change in your medication:   CHANGE Metoprolol Tartrate to one 25mg  tablet twice daily *hold medication if heart rate <60 bpm   *If you need a refill on your cardiac medications before your next appointment, please call your pharmacy*   Lab Work: None ordered today.  Testing/Procedures: Your EKG today showed atrial fibrillation 103 bpm.  Follow-Up: At [facility], you and your health needs are our priority.  As part of our continuing mission to provide you with exceptional heart care, we have created designated Provider Care Teams.  These Care Teams include your primary Cardiologist (physician) and Advanced Practice Providers (APPs -  Physician Assistants and Nurse Practitioners) who all work together to provide you with the care you need, when you need it.  We recommend signing up for the patient portal called "MyChart".  Sign up information is provided on this After Visit Summary.  MyChart is used to connect with patients for Virtual Visits (Telemedicine).  Patients are able to view lab/test results, encounter notes, upcoming appointments, etc.  Non-urgent messages can be sent to your provider as well.   To learn more about what you can do with MyChart, go to [facility].    Your next appointment:   Virtual visit as scheduled with [email or web address]. [email or web address] in April   Other Instructions  Chronic Venous Insufficiency Chronic venous insufficiency is a condition where the leg veins cannot effectively pump blood from the legs to the heart. This happens when the vein walls are either stretched, weakened, or damaged, or when the valves inside the vein are damaged. With the right treatment, you should be able to continue with an active life. This condition is also called venous stasis. What increases the risk? The following factors may make you more likely to develop this condition: Having a family history of  this condition. Obesity. Pregnancy. Living without enough regular physical activity or exercise (sedentary lifestyle). Smoking. Having a job that requires long periods of standing or sitting in one place. Being a certain age. Women in their 61s and 80s and men in their 85s are more likely to develop this condition. What are the signs or symptoms? Symptoms of this condition include: Veins that are enlarged, bulging, or twisted (varicose veins). Skin breakdown or ulcers. Reddened skin or dark discoloration of skin on the leg between the knee and ankle. Brown, smooth, tight, and painful skin just above the ankle, usually on the inside of the leg (lipodermatosclerosis). Swelling of the legs. How is this diagnosed? This condition may be diagnosed based on: Your medical history. A physical exam. Tests, such as: A procedure that creates an image of a blood vessel and nearby organs and provides information about blood flow through the blood vessel (duplex ultrasound). A procedure that tests blood flow (plethysmography). A procedure that looks at the veins using X-ray and dye (venogram). How is this treated? The goals of treatment are to help you return to an active life and to minimize pain or disability. Treatment depends on the severity of your condition, and it may include: Wearing compression stockings. These can help relieve symptoms and help prevent your condition from getting worse. However, they do not cure the condition. Sclerotherapy. This procedure involves an injection of a solution that shrinks damaged veins. Surgery. This may involve: Removing a diseased vein (vein stripping). Cutting off blood flow through the vein (laser ablation surgery). Repairing or reconstructing a valve within the affected vein. Follow these  instructions at home:   Wear compression stockings as told by your health care provider. These stockings help to prevent blood clots and reduce swelling in your  legs. Take over-the-counter and prescription medicines only as told by your health care provider. Stay active by exercising, walking, or doing different activities. Ask your health care provider what activities are safe for you and how much exercise you need. Drink enough fluid to keep your urine pale yellow. Do not use any products that contain nicotine or tobacco, such as cigarettes, e-cigarettes, and chewing tobacco. If you need help quitting, ask your health care provider. Keep all follow-up visits as told by your health care provider. This is important. Contact a health care provider if you: Have redness, swelling, or more pain in the affected area. See a red streak or line that goes up or down from the affected area. Have skin breakdown or skin loss in the affected area, even if the breakdown is small. Get an injury in the affected area. Get help right away if: You get an injury and an open wound in the affected area. You have: Severe pain that does not get better with medicine. Sudden numbness or weakness in the foot or ankle below the affected area. Trouble moving your foot or ankle. A fever. Worse or persistent symptoms. Chest pain. Shortness of breath. Summary Chronic venous insufficiency is a condition where the leg veins cannot effectively pump blood from the legs to the heart. Chronic venous insufficiency occurs when the vein walls become stretched, weakened, or damaged, or when valves within the vein are damaged. Treatment depends on how severe your condition is. It often involves wearing compression stockings and may involve having a procedure. Make sure you stay active by exercising, walking, or doing different activities. Ask your health care provider what activities are safe for you and how much exercise you need. This information is not intended to replace advice given to you by your health care provider. Make sure you discuss any questions you have with your health care  provider. Document Revised: 04/26/2020 Document Reviewed: 04/26/2020 Elsevier Patient Education  2022 [employer].

## 2021-05-25 ENCOUNTER — Encounter: Payer: Self-pay | Admitting: Behavioral Health

## 2021-05-25 ENCOUNTER — Ambulatory Visit (INDEPENDENT_AMBULATORY_CARE_PROVIDER_SITE_OTHER): Payer: Medicare PPO | Admitting: Behavioral Health

## 2021-05-25 DIAGNOSIS — F411 Generalized anxiety disorder: Secondary | ICD-10-CM | POA: Diagnosis not present

## 2021-05-25 NOTE — Progress Notes (Signed)
[facility] ? ?Patient ID: Yolanda Baker,  ?MRN: [id number] ? ?PCP: Crist Infante, MD ? ?Date of Evaluation: 05/25/2021 ?Time spent:30 minutes ? ?Chief Complaint:  ?Chief Complaint   ?Anxiety; Follow-up; Medication Refill ?  ? ? ?HISTORY/CURRENT STATUS: ?HPI ?88 year old female presents to this office for follow up and medication management. ?She is former patient of Dr. Lynder Parents. Her daughter [name] is present during interview with consent. Says that her anxiety has increased recently. She struggles a little more to communicate with more pronounced aphasia. She had nervous trembling in both hands which now has almost completely stopped. She continues with palliative care as needed. Says her anxiety today is 2/10 and her depression is 2/10. Sleeping 7 hours per night. No mania, no psychosis or delirium. No SI/HI. Wishes to follow up in 6 months for reassessment.  ?  ?Past Psychiatric Medication Trials: risperidone, Zoloft, citalopram 40, ziprasidone ? ? ?Individual Medical History/ Review of Systems: Changes? :No  ? ?Allergies: Flagyl [metronidazole] ? ?Current Medications:  ?Current Outpatient Medications:  ?  acetaminophen (TYLENOL) 500 MG tablet, Take 1,000 mg by mouth in the morning, at noon, and at bedtime., Disp: , Rfl:  ?  apixaban (ELIQUIS) 2.5 MG TABS tablet, Take 1 tablet (2.5 mg total) by mouth 2 (two) times daily., Disp: 60 tablet, Rfl: 2 ?  busPIRone (BUSPAR) 10 MG tablet, Take 10 mg by mouth daily., Disp: , Rfl:  ?  Calcium Carb-Cholecalciferol 600-100 MG-UNIT CAPS, Take 1 tablet by mouth in the morning and at bedtime., Disp: , Rfl:  ?  chlorhexidine (PERIDEX) 0.12 % solution, Use as directed 15 mLs in the mouth or throat 2 (two) times daily. Swish and spit, Disp: , Rfl:  ?  citalopram (CELEXA) 20 MG tablet, Take 1 tablet (20 mg total) by mouth daily., Disp: 30 tablet, Rfl: 0 ?  diclofenac Sodium (VOLTAREN) 1 % GEL, Apply 2 g topically 2 (two) times daily as needed (for musculoskeletal  chest pain). Apply to the chest area, Disp: 50 g, Rfl: 1 ?  furosemide (LASIX) 20 MG tablet, Take 20 mg by mouth daily., Disp: , Rfl:  ?  galantamine (RAZADYNE ER) 8 MG 24 hr capsule, Take 8 mg by mouth daily with breakfast., Disp: , Rfl:  ?  loperamide (IMODIUM) 2 MG capsule, Take 2 mg by mouth 3 (three) times daily as needed for diarrhea or loose stools. , Disp: , Rfl:  ?  metoprolol tartrate (LOPRESSOR) 25 MG tablet, Take 1 tablet (25 mg total) by mouth in the morning and at bedtime. Hold for heart rate less than 60 bpm., Disp: 60 tablet, Rfl: 2 ?  Multiple Vitamins-Minerals (CERTAVITE SENIOR/ANTIOXIDANT PO), Take 1 tablet by mouth daily. , Disp: , Rfl:  ?  Polyethyl Glycol-Propyl Glycol (SYSTANE) 0.4-0.3 % SOLN, Place 1 drop into both eyes in the morning and at bedtime. May use an additional 1 drop in each eye every hour as needed for irritation or dryness., Disp: , Rfl:  ?  polyethylene glycol (MIRALAX / GLYCOLAX) 17 g packet, Take 17 g by mouth every other day. May also take an additional 17gm once daily as needed for constipation, Disp: , Rfl:  ?  psyllium (REGULOID) 0.52 g capsule, Take 0.52 g by mouth daily. (Patient not taking: Reported on 04/21/2021), Disp: , Rfl:  ?Medication Side Effects: none ? ?Family Medical/ Social History: Changes? no ? ?MENTAL HEALTH EXAM: ? ?There were no vitals taken for this visit.There is no height or weight on  file to calculate BMI.  ?General Appearance: Casual, Neat, and Well Groomed  ?Eye Contact:  Good  ?Speech:   impaired, low volume, Aphasia  ?Volume:  Decreased  ?Mood:  Anxious  ?Affect:  Appropriate  ?Thought Process:  Coherent  ?Orientation:  Other:  alert and oriented x2  ?Thought Content: Logical   ?Suicidal Thoughts:  No  ?Homicidal Thoughts:  No  ?Memory:  Remote;   Fair  ?Judgement:  Fair  ?Insight:  Fair  ?Psychomotor Activity:  Decreased  ?Concentration:  Concentration: Fair  ?Recall:  Fair  ?Fund of Knowledge: Fair  ?Language: Fair  ?Assets:  Desire for  Improvement ?Physical Health ?Resilience ?Social Support  ?ADL's:  Impaired wheel chair  ?Cognition: Impaired,  Mild  ?Prognosis:  Poor due to advanced age  ? ? ?DIAGNOSES:  ?  ICD-10-CM   ?1. Generalized anxiety disorder  F41.1   ?  ? ? ?Receiving Psychotherapy: No  ? ? ?RECOMMENDATIONS:  ? ?Greater than 50% of 30 min face to face time with patient was spent on counseling and coordination of care. She presents with a little more decline since last visit 6 months ago. Aphasia is more pronounced. She reports having increase of anxiety. We discussed and planned the following: ?  ?Treatment Plan/Recommendations:  ?Continue Celexa 20 mg tablet daily ?Continue Geodon 20 mg daily at 2 pm ?Increase Buspar 10 mg twice daily ?Will report worsening symptoms promptly. ?Filled out documentation required by assisted living for visit. Vitals taken regularly in facility.   ?The will continue to handle refills as needed.  ? ? ?Elwanda Brooklyn, NP  ?

## 2021-05-29 ENCOUNTER — Telehealth (HOSPITAL_BASED_OUTPATIENT_CLINIC_OR_DEPARTMENT_OTHER): Payer: Medicare PPO | Admitting: Cardiovascular Disease

## 2021-06-13 ENCOUNTER — Telehealth (HOSPITAL_BASED_OUTPATIENT_CLINIC_OR_DEPARTMENT_OTHER): Payer: Medicare PPO | Admitting: Cardiovascular Disease

## 2021-06-22 ENCOUNTER — Encounter (HOSPITAL_BASED_OUTPATIENT_CLINIC_OR_DEPARTMENT_OTHER): Payer: Self-pay | Admitting: Cardiovascular Disease

## 2021-06-22 ENCOUNTER — Telehealth (INDEPENDENT_AMBULATORY_CARE_PROVIDER_SITE_OTHER): Payer: Medicare PPO | Admitting: Cardiovascular Disease

## 2021-06-22 DIAGNOSIS — R001 Bradycardia, unspecified: Secondary | ICD-10-CM

## 2021-06-22 DIAGNOSIS — R600 Localized edema: Secondary | ICD-10-CM

## 2021-06-22 DIAGNOSIS — I10 Essential (primary) hypertension: Secondary | ICD-10-CM | POA: Diagnosis not present

## 2021-06-22 DIAGNOSIS — I4891 Unspecified atrial fibrillation: Secondary | ICD-10-CM

## 2021-06-22 HISTORY — DX: Localized edema: R60.0

## 2021-06-22 NOTE — Assessment & Plan Note (Signed)
Remains in atrial fibrillation but rates are much better controlled now that she takes metoprolol every day. Continue metoprolol and Eliquis. ?

## 2021-06-22 NOTE — Progress Notes (Signed)
? ?Virtual Visit via Video Note  ? ?This visit type was conducted due to national recommendations for restrictions regarding the COVID-19 Pandemic (e.g. social distancing) in an effort to limit this patient's exposure and mitigate transmission in our community.  Due to her co-morbid illnesses, this patient is at least at moderate risk for complications without adequate follow up.  This format is felt to be most appropriate for this patient at this time.  All issues noted in this document were discussed and addressed.  A limited physical exam was performed with this format.  Please refer to the patient's chart for her consent to telehealth for [facility]. ? ?The patient was identified using 2 identifiers. ? ?Date:  06/22/2021  ? ?ID:  Yolanda Baker, DOB 1923/03/21, MRN [id number] ? ?Patient Location: Home ?Provider Location: Office/Clinic ?Visit Type: Video ? ?PCP:  Crist Infante, MD  ?Cardiologist:  Skeet Latch, MD  ?Electrophysiologist:  None  ? ?Evaluation Performed:  Follow-Up Visit ? ?Chief Complaint:   ? ?History of Present Illness:   ? ?The patient does not have symptoms concerning for COVID-19 infection (fever, chills, cough, or new shortness of breath).  ? ?Yolanda Baker is a 88 y.o. female with atrial fibrillation, hypertension, prior stroke, and dementia being seen today for follow up.  She was admitted 01/2019 with shortness of breath.  She was noted to be in new onset atrial fibrillation with rapid ventricular response.  She was mildly volume overloaded with a BNP of 335 and a pleural effusion on chest x-ray.  Creatinine was 1.8.  Symptoms improved with IV metoprolol and Lasix.  She was started on oral metoprolol and Lasix prior to discharge.  Echo at that time revealed LVEF 50 to 55% with moderate LVH and a possibility of cardiac amyloidosis.  Diastolic function was indeterminate.  Right ventricular function was normal.  She was seen by Coletta Memos, NP, on 01/2019.  The possibility of working  her up for amyloidosis was discussed with her daughter and she like to not to pursue it.  She was admitted to the hospital 04/2019 with a right periprosthetic hip fracture.  This occurred in the setting of falling out of her bed.  Conservative treatment with physical therapy and hip bracing was elected.  She was then admitted 04/2019 with shortness of breath thought to be due to pneumonia.  She followed up with Denyse Amass virtually on 04/2019 and was doing better.  Lately she has been feeling mostly well.  She notes that she is sometimes short of breath. She notices it most when she is active.  She sometimes notes it when laying in bed as well.  She has no orthopnea or PND.  Her weight has been stable around 100lb. She and her daughter have not noticed any edema.  She is still wearing a brace and is non-weight bearing.  She hopes to be able to walk soon but is waiting on a follow-up x-ray.  She has her blood pressure checked sporadically at her assisted living facility.  Her blood pressure tends to run in the 130s to 150s over 60s to 80s in the morning before taking her medication.  Later in the day it is in the 120s over 74s.  Her heart rate is pretty consistently in the 60s to 70s.  She denies palpitations.  ? ?Yolanda Baker reported exertional dyspnea.  She did not appear to be volume overloaded on limited exam.  We recommended that she have a BNP checked.  This did  not occur.  She was admitted 08/2019 with increasing shortness of breath.  She was hypoxic and tachycardic with atrial fibrillation with RVR.  CTA was negative for PE.  She did have pleural effusions and atelectasis.  She was treated with Lasix and rate control.  Echo revealed LVEF 45 to 50% with global hypokinesis.  Family requested cardiology consultation.  She was started on oral amiodarone.  Metoprolol was increased to 75mg .  She was again admitted 09/2019 with shortness of breath.  She was hypoxic to 88% on room air.  She was in atrial fibrillation that was  rate-controlled.  She was again admitted 09/2019 with acute on chronic heart failure.  She was doing fine and then acutely became short of breath with physical therapy and Occupational Therapy.  She was hypoxic and BNP was 415.  She had bilateral pleural effusions that were persistent from prior.  She required diuresis with IV Lasix.  At discharge Lasix was increased from 20 mg every other day to daily.  D-dimer was mildly elevated and CT-a was negative for PE.   ? ?Yolanda Baker was seen in the hospital 11/2019 with generalized weakness and bradycardia.  Amiodarone was held and metoprolol was reduced. Since then her PCP stopped amlodipine due to concern for bradycardia.  Since then her heart rate has remained low.  She reports feeling generally fatigued.  She broke her femur and was not a surgical candidate.  She had to stop participating with physical therapy because her heart rate got too high when she exercised.  She is now essentially bedbound or in her wheelchair.  Yolanda Baker is being cared for by palliative care.  Since her last appointment she has seen the atrial fibrillation team.  She was hospitalized due to bradycardia.  This resolved with stopping amiodarone.  There were no episodes of syncope.  Given that she was so frail her family elected palliative therapy rather than getting a pacemaker. ? ?She reported some chest wall tenderness that was not thought to be ischemic.  She was given Voltaren and this helped.  At her last visit 01/2021 her rates were poorly controlled in atrial fibrillation.  Metoprolol was increased to 25 mg twice daily.  Her heart failure symptoms were stable.  She followed up with Laurann Montana, NP on 03/2021 when a family member noted foot swelling.  Her home health RN recommended SCDs.  She was otherwise asymptomatic.  She was receiving metoprolol as needed.  In the office her heart rates were well controlled.  Her edema was thought to be due to venous insufficiency.  Her heart rate has  been ranging 70-100s in the am and then lower later in the day.  She has no palpitations and has been generally feeling well.  She really likes the SCDs and they have helped her edema.  ? ?Past Medical History:  ?Diagnosis Date  ? Anemia   ? Anxiety   ? Arthritis   ? neck  ? Bruises easily   ? Chronic diastolic CHF (congestive heart failure) ([place])   ? Chronic diastolic heart failure ([place])   ? COPD (chronic obstructive pulmonary disease) ([place])   ? Dementia ([place])   ? Depression   ? Dysrhythmia   ? hx A-FLUTTER after hip surg April 2015- take metoprolol  ? Eczema   ? Falls   ? Hip fracture ([place])   ? Hip joint pain   ? RT  ? Hypertension   ? Lower extremity edema 06/22/2021  ? Musculoskeletal chest  pain 02/13/2021  ? Osteoporosis   ? Persistent atrial fibrillation ([place]) 08/04/2019  ? Pneumonia   ? august 2017  ? Stroke [facility])   ? ?Past Surgical History:  ?Procedure Laterality Date  ? ABDOMINAL HYSTERECTOMY    ? FRACTURE SURGERY Right 06/05/2013  ? hip  ? HIP ARTHROPLASTY Right 10/03/2013  ? Procedure: CONVERSION OF PREVIOUS RIGHT HIP SURGERY TO A RIGHT TOTAL HIP ARTHROPLASTY;  Surgeon: Gearlean Alf, MD;  Location: [facility]S;  Service: Orthopedics;  Laterality: Right;  ? INTRAMEDULLARY (IM) NAIL INTERTROCHANTERIC Right 06/05/2013  ? Procedure: INTRAMEDULLARY (IM) NAIL INTERTROCHANTRIC;  Surgeon: Gearlean Alf, MD;  Location: [facility];  Service: Orthopedics;  Laterality: Right;  ? JOINT REPLACEMENT    ? left hip  ? left hip Left 2008  ? LEG SURGERY Right   ? x2  ?  ? ?Current Meds  ?Medication Sig  ? acetaminophen (TYLENOL) 500 MG tablet Take 1,000 mg by mouth in the morning, at noon, and at bedtime.  ? apixaban (ELIQUIS) 2.5 MG TABS tablet Take 1 tablet (2.5 mg total) by mouth 2 (two) times daily.  ? busPIRone (BUSPAR) 10 MG tablet Take 10 mg by mouth daily.  ? Calcium Carb-Cholecalciferol 600-100 MG-UNIT CAPS Take 1 tablet by mouth in the morning and at bedtime.  ? chlorhexidine (PERIDEX) 0.12 % solution Use as directed 15  mLs in the mouth or throat 2 (two) times daily. Swish and spit  ? citalopram (CELEXA) 20 MG tablet Take 1 tablet (20 mg total) by mouth daily.  ? diclofenac Sodium (VOLTAREN) 1 % GEL Apply 2 g topically 2 (tw

## 2021-06-22 NOTE — Assessment & Plan Note (Signed)
Stable on metoprolol.  No significant bradycardia.  ?

## 2021-06-22 NOTE — Patient Instructions (Signed)
Medication Instructions:  ?Your physician recommends that you continue on your current medications as directed. Please refer to the Current Medication list given to you today.  ? ?Labwork: ?NONE ? ?Testing/Procedures: ?NONE ? ?Follow-Up: ?VIRTUAL VISIT 10/15/2021 AT 10:20 AM WITH DR [place]  ?

## 2021-06-22 NOTE — Assessment & Plan Note (Signed)
Blood pressure is well-controlled.  Continue hydralazine and metoprolol.  ?

## 2021-06-22 NOTE — Assessment & Plan Note (Signed)
Better with SCDs.  Volume status is stable.  ?

## 2021-09-27 ENCOUNTER — Emergency Department (HOSPITAL_COMMUNITY): Payer: Medicare PPO

## 2021-09-27 ENCOUNTER — Other Ambulatory Visit: Payer: Self-pay

## 2021-09-27 ENCOUNTER — Emergency Department (HOSPITAL_COMMUNITY)
Admission: EM | Admit: 2021-09-27 | Discharge: 2021-09-28 | Disposition: A | Discharge status: Skilled Nursing Facility | Payer: Medicare PPO | Attending: Emergency Medicine | Admitting: Emergency Medicine

## 2021-09-27 DIAGNOSIS — I5032 Chronic diastolic (congestive) heart failure: Secondary | ICD-10-CM | POA: Diagnosis not present

## 2021-09-27 DIAGNOSIS — R0602 Shortness of breath: Secondary | ICD-10-CM | POA: Insufficient documentation

## 2021-09-27 DIAGNOSIS — F0390 Unspecified dementia without behavioral disturbance: Secondary | ICD-10-CM | POA: Diagnosis not present

## 2021-09-27 DIAGNOSIS — Z79899 Other long term (current) drug therapy: Secondary | ICD-10-CM | POA: Insufficient documentation

## 2021-09-27 DIAGNOSIS — Z7901 Long term (current) use of anticoagulants: Secondary | ICD-10-CM | POA: Insufficient documentation

## 2021-09-27 DIAGNOSIS — R251 Tremor, unspecified: Secondary | ICD-10-CM | POA: Insufficient documentation

## 2021-09-27 DIAGNOSIS — J449 Chronic obstructive pulmonary disease, unspecified: Secondary | ICD-10-CM | POA: Diagnosis not present

## 2021-09-27 DIAGNOSIS — I110 Hypertensive heart disease with heart failure: Secondary | ICD-10-CM | POA: Diagnosis not present

## 2021-09-27 LAB — CBC WITH DIFFERENTIAL/PLATELET
Abs Immature Granulocytes: 0.06 10*3/uL (ref 0.00–0.07)
Basophils Absolute: 0.1 10*3/uL (ref 0.0–0.1)
Basophils Relative: 1 %
Eosinophils Absolute: 0.4 10*3/uL (ref 0.0–0.5)
Eosinophils Relative: 5 %
HCT: 36.1 % (ref 36.0–46.0)
Hemoglobin: 11.8 g/dL — ABNORMAL LOW (ref 12.0–15.0)
Immature Granulocytes: 1 %
Lymphocytes Relative: 24 %
Lymphs Abs: 2.3 10*3/uL (ref 0.7–4.0)
MCH: 34.2 pg — ABNORMAL HIGH (ref 26.0–34.0)
MCHC: 32.7 g/dL (ref 30.0–36.0)
MCV: 104.6 fL — ABNORMAL HIGH (ref 80.0–100.0)
Monocytes Absolute: 1.3 10*3/uL — ABNORMAL HIGH (ref 0.1–1.0)
Monocytes Relative: 14 %
Neutro Abs: 5.2 10*3/uL (ref 1.7–7.7)
Neutrophils Relative %: 55 %
Platelets: 239 10*3/uL (ref 150–400)
RBC: 3.45 MIL/uL — ABNORMAL LOW (ref 3.87–5.11)
RDW: 14.2 % (ref 11.5–15.5)
WBC: 9.3 10*3/uL (ref 4.0–10.5)
nRBC: 0 % (ref 0.0–0.2)

## 2021-09-27 LAB — URINALYSIS, ROUTINE W REFLEX MICROSCOPIC
Bacteria, UA: NONE SEEN
Bilirubin Urine: NEGATIVE
Glucose, UA: NEGATIVE mg/dL
Hgb urine dipstick: NEGATIVE
Ketones, ur: NEGATIVE mg/dL
Nitrite: NEGATIVE
Protein, ur: 100 mg/dL — AB
Specific Gravity, Urine: 1.02 (ref 1.005–1.030)
pH: 5 (ref 5.0–8.0)

## 2021-09-27 LAB — COMPREHENSIVE METABOLIC PANEL
ALT: 13 U/L (ref 0–44)
AST: 25 U/L (ref 15–41)
Albumin: 3.4 g/dL — ABNORMAL LOW (ref 3.5–5.0)
Alkaline Phosphatase: 78 U/L (ref 38–126)
Anion gap: 9 (ref 5–15)
BUN: 27 mg/dL — ABNORMAL HIGH (ref 8–23)
CO2: 28 mmol/L (ref 22–32)
Calcium: 9.2 mg/dL (ref 8.9–10.3)
Chloride: 100 mmol/L (ref 98–111)
Creatinine, Ser: 1.2 mg/dL — ABNORMAL HIGH (ref 0.44–1.00)
GFR, Estimated: 41 mL/min — ABNORMAL LOW (ref >60)
Glucose, Bld: 94 mg/dL (ref 70–99)
Potassium: 3.6 mmol/L (ref 3.5–5.1)
Sodium: 137 mmol/L (ref 135–145)
Total Bilirubin: 0.5 mg/dL (ref 0.3–1.2)
Total Protein: 6.8 g/dL (ref 6.5–8.1)

## 2021-09-27 NOTE — ED Triage Notes (Signed)
BIBA from [facility] for [facility] and staff states she has some tremors all this is different from her normal, Pt has no complaints     164/111 80 96% RA

## 2021-09-27 NOTE — ED Provider Notes (Addendum)
[facility] DEPT Provider Note   CSN: [id number] Arrival date & time: 09/27/21  2002     History  Chief Complaint  Patient presents with   Shortness of Breath    Yolanda Baker is a 88 y.o. female.  Patient sent in from [facility] for concerns for shortness of breath.  Family was notified they were sending her in.  They also noted some tremors but this is different than normal for her.  Oxygen saturations on arrival here 98% on room air.  Not febrile.  Temp 98.8.  Patient is a DNR patient has a longstanding history of atrial fibs is on Eliquis.  Also past medical history of her hypertension COPD dementia chronic diastolic congestive heart failure.  Patient never smoked.  Patient last seen in the emergency department in August 2022 for pyelonephritis.       Home Medications Prior to Admission medications   Medication Sig Start Date End Date Taking? Authorizing Provider  acetaminophen (TYLENOL) 500 MG tablet Take 1,000 mg by mouth in the morning, at noon, and at bedtime.    [provider]  apixaban (ELIQUIS) 2.5 MG TABS tablet Take 1 tablet (2.5 mg total) by mouth 2 (two) times daily. 07/01/14   Donzetta Starch, NP  busPIRone (BUSPAR) 10 MG tablet Take 10 mg by mouth daily. 04/11/21   [provider]  Calcium Carb-Cholecalciferol 600-100 MG-UNIT CAPS Take 1 tablet by mouth in the morning and at bedtime.    [provider]  chlorhexidine (PERIDEX) 0.12 % solution Use as directed 15 mLs in the mouth or throat 2 (two) times daily. Swish and spit    [provider]  citalopram (CELEXA) 20 MG tablet Take 1 tablet (20 mg total) by mouth daily. 09/15/19   Swayze, Ava, DO  diclofenac Sodium (VOLTAREN) 1 % GEL Apply 2 g topically 2 (two) times daily as needed (for musculoskeletal chest pain). Apply to the chest area 03/02/21   Skeet Latch, MD  furosemide (LASIX) 20 MG tablet Take 20 mg by mouth daily.    [provider]  galantamine (RAZADYNE ER) 8 MG 24 hr capsule Take 8 mg by mouth daily with breakfast.    [provider]  hydrALAZINE (APRESOLINE) 50 MG tablet Take 50 mg by mouth 3 (three) times daily. 06/05/21   [provider]  loperamide (IMODIUM) 2 MG capsule Take 2 mg by mouth 3 (three) times daily as needed for diarrhea or loose stools.  07/14/19   [provider]  metoprolol tartrate (LOPRESSOR) 25 MG tablet Take 1 tablet (25 mg total) by mouth in the morning and at bedtime. Hold for heart rate less than 60 bpm. 04/21/21   Loel Dubonnet, NP  Multiple Vitamins-Minerals (CERTAVITE SENIOR/ANTIOXIDANT PO) Take 1 tablet by mouth daily.     [provider]  omeprazole (PRILOSEC) 40 MG capsule Take 40 mg by mouth daily. 05/28/21   [provider]  Polyethyl Glycol-Propyl Glycol (SYSTANE) 0.4-0.3 % SOLN Place 1 drop into both eyes in the morning and at bedtime. May use an additional 1 drop in each eye every hour as needed for irritation or dryness.    [provider]  polyethylene glycol (MIRALAX / GLYCOLAX) 17 g packet Take 17 g by mouth every other day. May also take an additional 17gm once daily as needed for constipation    [provider]  potassium chloride SA (KLOR-CON M) 20 MEQ tablet Take 1 tablet by mouth  at bedtime. 06/09/21   [provider]  psyllium (REGULOID) 0.52 g capsule Take 0.52 g by mouth daily.    [provider]  Zinc Oxide 22 % CREA Apply topically as directed. 01/10/21   [provider]  ziprasidone (GEODON) 20 MG capsule Take 20 mg by mouth daily. 06/02/21   [provider]      Allergies    Flagyl [metronidazole]    Review of Systems   Review of Systems  Unable to perform ROS: Dementia    Physical Exam Updated Vital Signs BP (!) 133/96   Pulse 73   Temp 98.8 F (37.1 C)   Resp 18   Ht 1.549 m (5\' 1" )   Wt 51.7 kg   SpO2 96%   BMI 21.54 kg/m  Physical Exam Vitals and  nursing note reviewed.  Constitutional:      General: She is not in acute distress.    Appearance: She is well-developed.  HENT:     Head: Normocephalic and atraumatic.     Mouth/Throat:     Mouth: Mucous membranes are moist.  Eyes:     Conjunctiva/sclera: Conjunctivae normal.  Cardiovascular:     Rate and Rhythm: Normal rate and regular rhythm.     Heart sounds: No murmur heard. Pulmonary:     Effort: Pulmonary effort is normal. No respiratory distress.     Breath sounds: Normal breath sounds. No wheezing, rhonchi or rales.     Comments: Breathing without any apparent difficulty. Chest:     Chest wall: No tenderness.  Abdominal:     General: There is no distension.     Palpations: Abdomen is soft.     Tenderness: There is no abdominal tenderness. There is no guarding.  Musculoskeletal:        General: No swelling.     Cervical back: Neck supple.     Right lower leg: No edema.     Left lower leg: No edema.  Skin:    General: Skin is warm and dry.     Capillary Refill: Capillary refill takes less than 2 seconds.  Neurological:     Mental Status: She is alert. Mental status is at baseline.     Comments: Patient is alert will follow some commands.  Psychiatric:        Mood and Affect: Mood normal.     ED Results / Procedures / Treatments   Labs (all labs ordered are listed, but only abnormal results are displayed) Labs Reviewed  COMPREHENSIVE METABOLIC PANEL - Abnormal; Notable for the following components:      Result Value   BUN 27 (*)    Creatinine, Ser 1.20 (*)    Albumin 3.4 (*)    GFR, Estimated 41 (*)    All other components within normal limits  CBC WITH DIFFERENTIAL/PLATELET - Abnormal; Notable for the following components:   RBC 3.45 (*)    Hemoglobin 11.8 (*)    MCV 104.6 (*)    MCH 34.2 (*)    Monocytes Absolute 1.3 (*)    All other components within normal limits  URINALYSIS, ROUTINE W REFLEX MICROSCOPIC - Abnormal; Notable for the following  components:   APPearance HAZY (*)    Protein, ur 100 (*)    Leukocytes,Ua LARGE (*)    All other components within normal limits    EKG EKG Interpretation  Date/Time:  Wednesday September 27 2021 22:14:27 EDT Ventricular Rate:  87 PR Interval:    QRS Duration: 91  QT Interval:  375 QTC Calculation: 405 R Axis:   106 Text Interpretation: Atrial fibrillation Anterior infarct, old Confirmed by Vanetta Mulders [phone]) on 09/27/2021 10:26:38 PM  Radiology DG Chest Port 1 View  Result Date: 09/27/2021 CLINICAL DATA:  Shortness of breath. EXAM: PORTABLE CHEST 1 VIEW COMPARISON:  Chest x-ray 11/28/2019 FINDINGS: The heart is enlarged in the aorta is tortuous, unchanged. There is some patchy opacities in the left lung base with likely blunting of the left costophrenic angle. The right lung is clear. There is no pneumothorax or acute fracture. IMPRESSION: 1. Small left pleural effusion with left basilar atelectasis/airspace disease. Electronically Signed   By: Darliss Cheney M.D.   On: 09/27/2021 22:24    Procedures Procedures    Medications Ordered in ED Medications - No data to display  ED Course/ Medical Decision Making/ A&P                           Medical Decision Making Amount and/or Complexity of Data Reviewed Labs: ordered. Radiology: ordered.   Work-up here chest x-ray raise some concerns may be for small left pleural effusion with left basilar atelectasis or airspace disease.  We will get CT chest without contrast to evaluate that further make sure no evidence of pneumonia.  Patient without a leukocytosis white blood cell count 9.3 hemoglobin 11.8.  Complete metabolic panel without acute findings other BUN and creatinine a little higher than her baseline giving her GFR of 41.  Electrolytes are normal liver function test are normal.  Urinalysis is negative for urinary tract infection.  So that is reassuring.  Clinically not really concerned about pulmonary embolus.  Patient's  oxygen saturations are very good.  EKG and cardiac monitoring consistent with rate controlled atrial fibrillation.  Which patient has a history of.  Patient is on Eliquis for that.  Patient's increase in GFR could be secondary to some mild dehydration.  But her last GFR was from 2022.  We will get the CT chest to rule out evidence of pneumonia if negative patient stable for discharge back to facility.  If positive probably will need medical admission at least for IV antibiotics overnight.  Based on her age.  CT scan just shows small bilateral pleural effusions no evidence of pneumonia.  No other acute findings.  Patient without any tremors here.  Patient stable for discharge back to nursing facility.   Final Clinical Impression(s) / ED Diagnoses Final diagnoses:  SOB (shortness of breath)    Rx / DC Orders ED Discharge Orders     None         Vanetta Mulders, MD 09/27/21 2315    Vanetta Mulders, MD 09/27/21 2316    Vanetta Mulders, MD 09/27/21 [phone]

## 2021-09-27 NOTE — Discharge Instructions (Signed)
Extensive work-up here without any acute findings.  No evidence of urinary tract infection no significant lab abnormalities other than may be some mild dehydration.  Chest x-ray raise some concerns about pneumonia so CT chest was done shows no evidence of pneumonia.  There is bilateral small pleural effusions.  Patient's oxygen saturations here are fine patient not in any acute distress.

## 2021-10-03 ENCOUNTER — Encounter (HOSPITAL_BASED_OUTPATIENT_CLINIC_OR_DEPARTMENT_OTHER): Payer: Self-pay | Admitting: Cardiovascular Disease

## 2021-10-03 ENCOUNTER — Telehealth: Payer: Self-pay | Admitting: Behavioral Health

## 2021-10-03 ENCOUNTER — Other Ambulatory Visit: Payer: Self-pay | Admitting: Psychiatry

## 2021-10-03 NOTE — Telephone Encounter (Signed)
Im sorry but that is not a medication that I prescribe.

## 2021-10-03 NOTE — Telephone Encounter (Signed)
Candy informed

## 2021-10-03 NOTE — Telephone Encounter (Signed)
Yolanda Baker thinks dr cottle use to prescribe this med.She wanted me to see if he knew how she can wean off of it.It is for dementia.

## 2021-10-03 NOTE — Telephone Encounter (Signed)
Please review

## 2021-10-03 NOTE — Telephone Encounter (Signed)
She is on the lowest dosage so she can just stop it.  My best wishes to her and the family.

## 2021-10-03 NOTE — Telephone Encounter (Signed)
Thanks, I was not familiar with this medication and was going to have to look it up.  Same for me, please advise them our thoughts and prayers for family. She is a very nice woman.

## 2021-10-03 NOTE — Telephone Encounter (Signed)
Yolanda Baker's daughter called at 10:29 am. She said that her mom has been admitted to hospice care. The hospice doctor said that Tatelyn needs to stop taking the galantine because he feels she doesn't need it. So she would like to know how to wean her mom off of this medication. Please call Yolanda at [phone]

## 2021-10-04 NOTE — Telephone Encounter (Signed)
Please advise for keeping appointment?

## 2021-10-25 ENCOUNTER — Telehealth (HOSPITAL_BASED_OUTPATIENT_CLINIC_OR_DEPARTMENT_OTHER): Payer: Medicare PPO | Admitting: Cardiovascular Disease

## 2021-11-27 ENCOUNTER — Ambulatory Visit: Payer: Medicare PPO | Admitting: Behavioral Health

## 2022-04-05 ENCOUNTER — Encounter (HOSPITAL_COMMUNITY): Payer: Self-pay | Admitting: *Deleted

## 2022-06-27 DEATH — deceased
